# Patient Record
Sex: Male | Born: 1935 | Race: Black or African American | Hispanic: No | Smoking: Former smoker
Health system: Southern US, Community
[De-identification: ages and names within clinical notes are randomized; demographics above are authoritative.]

## PROBLEM LIST (undated history)

## (undated) DIAGNOSIS — I10 Essential (primary) hypertension: Secondary | ICD-10-CM

## (undated) DIAGNOSIS — R011 Cardiac murmur, unspecified: Secondary | ICD-10-CM

## (undated) DIAGNOSIS — K449 Diaphragmatic hernia without obstruction or gangrene: Secondary | ICD-10-CM

## (undated) DIAGNOSIS — K219 Gastro-esophageal reflux disease without esophagitis: Secondary | ICD-10-CM

## (undated) DIAGNOSIS — Z8669 Personal history of other diseases of the nervous system and sense organs: Secondary | ICD-10-CM

## (undated) DIAGNOSIS — M4800 Spinal stenosis, site unspecified: Secondary | ICD-10-CM

## (undated) DIAGNOSIS — N400 Enlarged prostate without lower urinary tract symptoms: Secondary | ICD-10-CM

## (undated) HISTORY — DX: Benign prostatic hyperplasia without lower urinary tract symptoms: N40.0

## (undated) HISTORY — DX: Personal history of other diseases of the nervous system and sense organs: Z86.69

## (undated) HISTORY — DX: Essential (primary) hypertension: I10

## (undated) HISTORY — PX: TOTAL KNEE ARTHROPLASTY: SHX125

## (undated) HISTORY — DX: Diaphragmatic hernia without obstruction or gangrene: K44.9

## (undated) HISTORY — DX: Spinal stenosis, site unspecified: M48.00

## (undated) HISTORY — DX: Gastro-esophageal reflux disease without esophagitis: K21.9

## (undated) HISTORY — PX: CATARACT EXTRACTION, BILATERAL: SHX1313

## (undated) HISTORY — DX: Cardiac murmur, unspecified: R01.1

---

## 1997-07-27 ENCOUNTER — Encounter: Admission: RE | Admit: 1997-07-27 | Discharge: 1997-10-25 | Payer: Self-pay | Admitting: Sports Medicine

## 1997-07-27 ENCOUNTER — Encounter: Admission: RE | Admit: 1997-07-27 | Discharge: 1997-07-27 | Payer: Self-pay | Admitting: Sports Medicine

## 1997-10-09 ENCOUNTER — Encounter: Admission: RE | Admit: 1997-10-09 | Discharge: 1997-10-09 | Payer: Self-pay | Admitting: Family Medicine

## 1997-11-03 ENCOUNTER — Encounter: Admission: RE | Admit: 1997-11-03 | Discharge: 1997-11-03 | Payer: Self-pay | Admitting: Family Medicine

## 1997-12-25 ENCOUNTER — Encounter: Admission: RE | Admit: 1997-12-25 | Discharge: 1997-12-25 | Payer: Self-pay | Admitting: Family Medicine

## 1998-01-15 ENCOUNTER — Encounter: Admission: RE | Admit: 1998-01-15 | Discharge: 1998-01-15 | Payer: Self-pay | Admitting: Family Medicine

## 1998-02-01 ENCOUNTER — Encounter: Admission: RE | Admit: 1998-02-01 | Discharge: 1998-02-01 | Payer: Self-pay | Admitting: Family Medicine

## 1998-02-15 ENCOUNTER — Encounter: Admission: RE | Admit: 1998-02-15 | Discharge: 1998-02-15 | Payer: Self-pay | Admitting: Sports Medicine

## 1998-04-06 ENCOUNTER — Encounter: Admission: RE | Admit: 1998-04-06 | Discharge: 1998-04-06 | Payer: Self-pay | Admitting: Family Medicine

## 1998-05-10 ENCOUNTER — Encounter: Admission: RE | Admit: 1998-05-10 | Discharge: 1998-05-10 | Payer: Self-pay | Admitting: Sports Medicine

## 1998-05-31 ENCOUNTER — Ambulatory Visit (HOSPITAL_COMMUNITY): Admission: RE | Admit: 1998-05-31 | Discharge: 1998-05-31 | Payer: Self-pay | Admitting: Sports Medicine

## 1998-05-31 ENCOUNTER — Encounter: Admission: RE | Admit: 1998-05-31 | Discharge: 1998-05-31 | Payer: Self-pay | Admitting: Sports Medicine

## 1998-07-10 ENCOUNTER — Encounter: Admission: RE | Admit: 1998-07-10 | Discharge: 1998-07-10 | Payer: Self-pay | Admitting: Sports Medicine

## 1998-10-16 ENCOUNTER — Encounter: Admission: RE | Admit: 1998-10-16 | Discharge: 1998-10-16 | Payer: Self-pay | Admitting: Family Medicine

## 1999-01-10 ENCOUNTER — Encounter: Admission: RE | Admit: 1999-01-10 | Discharge: 1999-01-10 | Payer: Self-pay | Admitting: Family Medicine

## 1999-01-17 ENCOUNTER — Encounter: Admission: RE | Admit: 1999-01-17 | Discharge: 1999-01-17 | Payer: Self-pay | Admitting: Sports Medicine

## 1999-02-07 ENCOUNTER — Encounter: Admission: RE | Admit: 1999-02-07 | Discharge: 1999-02-07 | Payer: Self-pay | Admitting: Family Medicine

## 1999-04-17 ENCOUNTER — Encounter: Admission: RE | Admit: 1999-04-17 | Discharge: 1999-04-17 | Payer: Self-pay | Admitting: Family Medicine

## 1999-05-09 ENCOUNTER — Encounter: Admission: RE | Admit: 1999-05-09 | Discharge: 1999-05-09 | Payer: Self-pay | Admitting: Sports Medicine

## 1999-06-06 ENCOUNTER — Encounter: Admission: RE | Admit: 1999-06-06 | Discharge: 1999-06-06 | Payer: Self-pay | Admitting: Sports Medicine

## 1999-07-05 ENCOUNTER — Ambulatory Visit (HOSPITAL_COMMUNITY): Admission: RE | Admit: 1999-07-05 | Discharge: 1999-07-05 | Payer: Self-pay | Admitting: Sports Medicine

## 1999-08-01 ENCOUNTER — Encounter: Admission: RE | Admit: 1999-08-01 | Discharge: 1999-08-01 | Payer: Self-pay | Admitting: Family Medicine

## 1999-09-24 ENCOUNTER — Encounter: Admission: RE | Admit: 1999-09-24 | Discharge: 1999-09-24 | Payer: Self-pay | Admitting: Sports Medicine

## 2000-01-24 ENCOUNTER — Encounter: Admission: RE | Admit: 2000-01-24 | Discharge: 2000-01-24 | Payer: Self-pay | Admitting: Family Medicine

## 2000-03-24 ENCOUNTER — Encounter (INDEPENDENT_AMBULATORY_CARE_PROVIDER_SITE_OTHER): Payer: Self-pay

## 2000-03-24 ENCOUNTER — Other Ambulatory Visit: Admission: RE | Admit: 2000-03-24 | Discharge: 2000-03-24 | Payer: Self-pay | Admitting: Urology

## 2000-03-26 ENCOUNTER — Encounter: Admission: RE | Admit: 2000-03-26 | Discharge: 2000-03-26 | Payer: Self-pay | Admitting: Family Medicine

## 2000-06-18 ENCOUNTER — Encounter: Admission: RE | Admit: 2000-06-18 | Discharge: 2000-06-18 | Payer: Self-pay | Admitting: Sports Medicine

## 2000-07-27 ENCOUNTER — Encounter: Admission: RE | Admit: 2000-07-27 | Discharge: 2000-07-27 | Payer: Self-pay | Admitting: Family Medicine

## 2000-10-22 ENCOUNTER — Encounter: Admission: RE | Admit: 2000-10-22 | Discharge: 2000-10-22 | Payer: Self-pay | Admitting: Family Medicine

## 2000-11-19 ENCOUNTER — Encounter: Admission: RE | Admit: 2000-11-19 | Discharge: 2000-11-19 | Payer: Self-pay | Admitting: Sports Medicine

## 2001-06-16 ENCOUNTER — Encounter: Admission: RE | Admit: 2001-06-16 | Discharge: 2001-06-16 | Payer: Self-pay | Admitting: Family Medicine

## 2001-07-06 ENCOUNTER — Encounter: Admission: RE | Admit: 2001-07-06 | Discharge: 2001-07-06 | Payer: Self-pay | Admitting: Sports Medicine

## 2001-07-06 ENCOUNTER — Ambulatory Visit (HOSPITAL_COMMUNITY): Admission: RE | Admit: 2001-07-06 | Discharge: 2001-07-06 | Payer: Self-pay | Admitting: Sports Medicine

## 2001-09-24 ENCOUNTER — Encounter: Admission: RE | Admit: 2001-09-24 | Discharge: 2001-09-24 | Payer: Self-pay | Admitting: Family Medicine

## 2001-10-19 ENCOUNTER — Ambulatory Visit (HOSPITAL_COMMUNITY): Admission: RE | Admit: 2001-10-19 | Discharge: 2001-10-19 | Payer: Self-pay | Admitting: Sports Medicine

## 2001-10-19 ENCOUNTER — Encounter: Admission: RE | Admit: 2001-10-19 | Discharge: 2001-10-19 | Payer: Self-pay | Admitting: Sports Medicine

## 2002-01-18 ENCOUNTER — Ambulatory Visit (HOSPITAL_COMMUNITY): Admission: RE | Admit: 2002-01-18 | Discharge: 2002-01-18 | Payer: Self-pay | Admitting: Family Medicine

## 2002-01-18 ENCOUNTER — Encounter: Admission: RE | Admit: 2002-01-18 | Discharge: 2002-01-18 | Payer: Self-pay | Admitting: Family Medicine

## 2002-01-19 ENCOUNTER — Encounter: Admission: RE | Admit: 2002-01-19 | Discharge: 2002-01-19 | Payer: Self-pay | Admitting: Sports Medicine

## 2002-04-01 ENCOUNTER — Encounter: Admission: RE | Admit: 2002-04-01 | Discharge: 2002-04-01 | Payer: Self-pay | Admitting: Sports Medicine

## 2002-05-19 ENCOUNTER — Encounter: Admission: RE | Admit: 2002-05-19 | Discharge: 2002-05-19 | Payer: Self-pay | Admitting: Sports Medicine

## 2002-07-14 ENCOUNTER — Encounter: Admission: RE | Admit: 2002-07-14 | Discharge: 2002-07-14 | Payer: Self-pay | Admitting: Sports Medicine

## 2002-08-24 ENCOUNTER — Encounter: Admission: RE | Admit: 2002-08-24 | Discharge: 2002-08-24 | Payer: Self-pay | Admitting: Family Medicine

## 2002-10-28 ENCOUNTER — Ambulatory Visit (HOSPITAL_COMMUNITY): Admission: RE | Admit: 2002-10-28 | Discharge: 2002-10-28 | Payer: Self-pay | Admitting: Sports Medicine

## 2002-12-12 ENCOUNTER — Encounter: Admission: RE | Admit: 2002-12-12 | Discharge: 2002-12-12 | Payer: Self-pay | Admitting: Family Medicine

## 2003-01-12 ENCOUNTER — Encounter: Admission: RE | Admit: 2003-01-12 | Discharge: 2003-01-12 | Payer: Self-pay | Admitting: Sports Medicine

## 2003-02-20 ENCOUNTER — Encounter: Admission: RE | Admit: 2003-02-20 | Discharge: 2003-02-20 | Payer: Self-pay | Admitting: Family Medicine

## 2003-02-20 ENCOUNTER — Ambulatory Visit (HOSPITAL_COMMUNITY): Admission: RE | Admit: 2003-02-20 | Discharge: 2003-02-20 | Payer: Self-pay | Admitting: Family Medicine

## 2003-03-09 ENCOUNTER — Ambulatory Visit (HOSPITAL_COMMUNITY): Admission: RE | Admit: 2003-03-09 | Discharge: 2003-03-09 | Payer: Self-pay | Admitting: Sports Medicine

## 2003-03-09 ENCOUNTER — Encounter: Payer: Self-pay | Admitting: Cardiology

## 2003-03-16 ENCOUNTER — Encounter: Admission: RE | Admit: 2003-03-16 | Discharge: 2003-03-16 | Payer: Self-pay | Admitting: Family Medicine

## 2003-05-11 ENCOUNTER — Encounter: Admission: RE | Admit: 2003-05-11 | Discharge: 2003-05-11 | Payer: Self-pay | Admitting: Sports Medicine

## 2003-09-07 ENCOUNTER — Encounter: Admission: RE | Admit: 2003-09-07 | Discharge: 2003-09-07 | Payer: Self-pay | Admitting: Sports Medicine

## 2003-12-04 ENCOUNTER — Encounter: Admission: RE | Admit: 2003-12-04 | Discharge: 2003-12-04 | Payer: Self-pay | Admitting: Family Medicine

## 2004-01-04 ENCOUNTER — Ambulatory Visit: Payer: Self-pay | Admitting: Sports Medicine

## 2004-01-23 ENCOUNTER — Ambulatory Visit: Payer: Self-pay | Admitting: Family Medicine

## 2004-07-18 ENCOUNTER — Ambulatory Visit: Payer: Self-pay | Admitting: Sports Medicine

## 2004-11-12 ENCOUNTER — Ambulatory Visit: Payer: Self-pay | Admitting: Sports Medicine

## 2004-11-12 ENCOUNTER — Ambulatory Visit (HOSPITAL_COMMUNITY): Admission: RE | Admit: 2004-11-12 | Discharge: 2004-11-12 | Payer: Self-pay | Admitting: Sports Medicine

## 2005-02-04 ENCOUNTER — Ambulatory Visit: Payer: Self-pay | Admitting: Sports Medicine

## 2005-03-06 ENCOUNTER — Ambulatory Visit: Payer: Self-pay | Admitting: Sports Medicine

## 2005-04-08 ENCOUNTER — Ambulatory Visit: Payer: Self-pay | Admitting: Sports Medicine

## 2006-01-15 ENCOUNTER — Ambulatory Visit: Payer: Self-pay | Admitting: Sports Medicine

## 2006-04-14 ENCOUNTER — Emergency Department (HOSPITAL_COMMUNITY): Admission: EM | Admit: 2006-04-14 | Discharge: 2006-04-14 | Payer: Self-pay | Admitting: Emergency Medicine

## 2006-05-26 ENCOUNTER — Encounter: Payer: Self-pay | Admitting: Sports Medicine

## 2006-05-26 ENCOUNTER — Ambulatory Visit: Payer: Self-pay | Admitting: Family Medicine

## 2006-05-26 LAB — CONVERTED CEMR LAB
ALT: 17 units/L (ref 0–53)
AST: 19 units/L (ref 0–37)
Albumin: 4.1 g/dL (ref 3.5–5.2)
Alkaline Phosphatase: 40 units/L (ref 39–117)
BUN: 19 mg/dL (ref 6–23)
CO2: 26 meq/L (ref 19–32)
Calcium: 8.8 mg/dL (ref 8.4–10.5)
Chloride: 99 meq/L (ref 96–112)
Cholesterol: 189 mg/dL (ref 0–200)
Creatinine, Ser: 1.65 mg/dL — ABNORMAL HIGH (ref 0.40–1.50)
Glucose, Bld: 85 mg/dL (ref 70–99)
HCT: 43.2 % (ref 39.0–52.0)
HDL: 60 mg/dL (ref >39)
Hemoglobin: 14 g/dL (ref 13.0–17.0)
LDL Cholesterol: 116 mg/dL — ABNORMAL HIGH (ref 0–99)
MCHC: 32.4 g/dL (ref 30.0–36.0)
MCV: 83.9 fL (ref 78.0–100.0)
PSA: 1.94 ng/mL (ref 0.10–4.00)
Platelets: 129 10*3/uL — ABNORMAL LOW (ref 150–400)
Potassium: 4.5 meq/L (ref 3.5–5.3)
RBC: 5.15 M/uL (ref 4.22–5.81)
RDW: 15.5 % — ABNORMAL HIGH (ref 11.5–14.0)
Sodium: 138 meq/L (ref 135–145)
Total Bilirubin: 0.6 mg/dL (ref 0.3–1.2)
Total CHOL/HDL Ratio: 3.2
Total Protein: 7.5 g/dL (ref 6.0–8.3)
Triglycerides: 63 mg/dL (ref <150)
VLDL: 13 mg/dL (ref 0–40)
WBC: 6.4 10*3/uL (ref 4.0–10.5)

## 2006-06-02 ENCOUNTER — Ambulatory Visit: Payer: Self-pay | Admitting: Sports Medicine

## 2006-06-08 ENCOUNTER — Encounter: Admission: RE | Admit: 2006-06-08 | Discharge: 2006-06-08 | Payer: Self-pay | Admitting: Sports Medicine

## 2006-06-11 ENCOUNTER — Ambulatory Visit: Payer: Self-pay | Admitting: Sports Medicine

## 2006-06-19 ENCOUNTER — Ambulatory Visit: Payer: Self-pay | Admitting: Family Medicine

## 2006-06-25 DIAGNOSIS — I10 Essential (primary) hypertension: Secondary | ICD-10-CM | POA: Insufficient documentation

## 2006-06-25 DIAGNOSIS — J45909 Unspecified asthma, uncomplicated: Secondary | ICD-10-CM | POA: Insufficient documentation

## 2006-06-25 DIAGNOSIS — E785 Hyperlipidemia, unspecified: Secondary | ICD-10-CM | POA: Insufficient documentation

## 2006-06-25 DIAGNOSIS — F5232 Male orgasmic disorder: Secondary | ICD-10-CM | POA: Insufficient documentation

## 2006-06-26 ENCOUNTER — Encounter: Payer: Self-pay | Admitting: Sports Medicine

## 2006-06-26 ENCOUNTER — Ambulatory Visit: Payer: Self-pay | Admitting: Family Medicine

## 2006-06-26 LAB — CONVERTED CEMR LAB: Uric Acid, Serum: 8.8 mg/dL — ABNORMAL HIGH (ref 2.4–7.0)

## 2006-07-01 ENCOUNTER — Telehealth: Payer: Self-pay | Admitting: Sports Medicine

## 2006-07-17 ENCOUNTER — Telehealth: Payer: Self-pay | Admitting: *Deleted

## 2006-07-22 ENCOUNTER — Telehealth: Payer: Self-pay | Admitting: *Deleted

## 2006-07-23 ENCOUNTER — Encounter: Payer: Self-pay | Admitting: Family Medicine

## 2006-07-23 ENCOUNTER — Ambulatory Visit: Payer: Self-pay | Admitting: Sports Medicine

## 2006-07-24 ENCOUNTER — Ambulatory Visit: Payer: Self-pay | Admitting: Sports Medicine

## 2006-07-24 DIAGNOSIS — M5137 Other intervertebral disc degeneration, lumbosacral region: Secondary | ICD-10-CM | POA: Insufficient documentation

## 2006-07-24 DIAGNOSIS — M51379 Other intervertebral disc degeneration, lumbosacral region without mention of lumbar back pain or lower extremity pain: Secondary | ICD-10-CM | POA: Insufficient documentation

## 2006-08-28 ENCOUNTER — Ambulatory Visit: Payer: Self-pay | Admitting: Sports Medicine

## 2006-09-11 ENCOUNTER — Ambulatory Visit: Payer: Self-pay | Admitting: Sports Medicine

## 2006-09-28 ENCOUNTER — Telehealth (INDEPENDENT_AMBULATORY_CARE_PROVIDER_SITE_OTHER): Payer: Self-pay | Admitting: *Deleted

## 2006-10-06 ENCOUNTER — Encounter: Payer: Self-pay | Admitting: Sports Medicine

## 2006-11-06 ENCOUNTER — Ambulatory Visit: Payer: Self-pay | Admitting: Sports Medicine

## 2006-11-06 ENCOUNTER — Ambulatory Visit (HOSPITAL_COMMUNITY): Admission: RE | Admit: 2006-11-06 | Discharge: 2006-11-06 | Payer: Self-pay | Admitting: Sports Medicine

## 2006-12-03 ENCOUNTER — Emergency Department (HOSPITAL_COMMUNITY): Admission: EM | Admit: 2006-12-03 | Discharge: 2006-12-03 | Payer: Self-pay | Admitting: Emergency Medicine

## 2006-12-07 ENCOUNTER — Ambulatory Visit: Payer: Self-pay | Admitting: Sports Medicine

## 2006-12-07 ENCOUNTER — Encounter (INDEPENDENT_AMBULATORY_CARE_PROVIDER_SITE_OTHER): Payer: Self-pay | Admitting: Family Medicine

## 2006-12-07 LAB — CONVERTED CEMR LAB
ALT: 27 units/L (ref 0–53)
AST: 18 units/L (ref 0–37)
Albumin: 4.1 g/dL (ref 3.5–5.2)
Alkaline Phosphatase: 50 units/L (ref 39–117)
BUN: 20 mg/dL (ref 6–23)
Basophils Absolute: 0 10*3/uL (ref 0.0–0.1)
Basophils Relative: 0 % (ref 0–1)
CO2: 23 meq/L (ref 19–32)
Calcium: 8.8 mg/dL (ref 8.4–10.5)
Chloride: 105 meq/L (ref 96–112)
Creatinine, Ser: 1.41 mg/dL (ref 0.40–1.50)
Eosinophils Absolute: 0.1 10*3/uL (ref 0.0–0.7)
Eosinophils Relative: 1 % (ref 0–5)
Glucose, Bld: 90 mg/dL (ref 70–99)
HCT: 41.1 % (ref 39.0–52.0)
Hemoglobin: 13.5 g/dL (ref 13.0–17.0)
Lymphocytes Relative: 28 % (ref 12–46)
Lymphs Abs: 2 10*3/uL (ref 0.7–3.3)
MCHC: 32.8 g/dL (ref 30.0–36.0)
MCV: 83.9 fL (ref 78.0–100.0)
Monocytes Absolute: 0.6 10*3/uL (ref 0.2–0.7)
Monocytes Relative: 8 % (ref 3–11)
Neutro Abs: 4.5 10*3/uL (ref 1.7–7.7)
Neutrophils Relative %: 63 % (ref 43–77)
Platelets: 139 10*3/uL — ABNORMAL LOW (ref 150–400)
Potassium: 3.9 meq/L (ref 3.5–5.3)
RBC: 4.9 M/uL (ref 4.22–5.81)
RDW: 15 % — ABNORMAL HIGH (ref 11.5–14.0)
Sodium: 139 meq/L (ref 135–145)
Total Bilirubin: 0.6 mg/dL (ref 0.3–1.2)
Total Protein: 7.3 g/dL (ref 6.0–8.3)
WBC: 7.2 10*3/uL (ref 4.0–10.5)

## 2006-12-15 ENCOUNTER — Ambulatory Visit: Payer: Self-pay | Admitting: Sports Medicine

## 2007-01-05 ENCOUNTER — Ambulatory Visit: Payer: Self-pay | Admitting: Sports Medicine

## 2007-01-13 ENCOUNTER — Telehealth: Payer: Self-pay | Admitting: Sports Medicine

## 2007-02-01 ENCOUNTER — Telehealth: Payer: Self-pay | Admitting: *Deleted

## 2007-02-03 ENCOUNTER — Encounter: Payer: Self-pay | Admitting: *Deleted

## 2007-02-05 ENCOUNTER — Encounter (INDEPENDENT_AMBULATORY_CARE_PROVIDER_SITE_OTHER): Payer: Self-pay | Admitting: *Deleted

## 2007-02-09 ENCOUNTER — Ambulatory Visit: Payer: Self-pay | Admitting: Sports Medicine

## 2007-02-18 ENCOUNTER — Ambulatory Visit: Payer: Self-pay | Admitting: Family Medicine

## 2007-02-18 ENCOUNTER — Telehealth (INDEPENDENT_AMBULATORY_CARE_PROVIDER_SITE_OTHER): Payer: Self-pay | Admitting: *Deleted

## 2007-02-18 DIAGNOSIS — IMO0002 Reserved for concepts with insufficient information to code with codable children: Secondary | ICD-10-CM | POA: Insufficient documentation

## 2007-02-18 DIAGNOSIS — M1710 Unilateral primary osteoarthritis, unspecified knee: Secondary | ICD-10-CM

## 2007-02-26 ENCOUNTER — Telehealth: Payer: Self-pay | Admitting: Sports Medicine

## 2007-03-01 ENCOUNTER — Telehealth: Payer: Self-pay | Admitting: Sports Medicine

## 2007-03-04 ENCOUNTER — Telehealth: Payer: Self-pay | Admitting: *Deleted

## 2007-03-23 ENCOUNTER — Encounter: Payer: Self-pay | Admitting: Sports Medicine

## 2007-04-07 ENCOUNTER — Telehealth: Payer: Self-pay | Admitting: Sports Medicine

## 2007-04-09 ENCOUNTER — Encounter: Payer: Self-pay | Admitting: Sports Medicine

## 2007-04-14 ENCOUNTER — Telehealth: Payer: Self-pay | Admitting: Sports Medicine

## 2007-04-27 ENCOUNTER — Telehealth: Payer: Self-pay | Admitting: Sports Medicine

## 2007-05-04 ENCOUNTER — Telehealth (INDEPENDENT_AMBULATORY_CARE_PROVIDER_SITE_OTHER): Payer: Self-pay | Admitting: *Deleted

## 2007-05-05 ENCOUNTER — Encounter: Payer: Self-pay | Admitting: Sports Medicine

## 2007-05-12 ENCOUNTER — Telehealth: Payer: Self-pay | Admitting: *Deleted

## 2007-05-14 ENCOUNTER — Ambulatory Visit: Payer: Self-pay | Admitting: Sports Medicine

## 2007-05-14 DIAGNOSIS — Z8739 Personal history of other diseases of the musculoskeletal system and connective tissue: Secondary | ICD-10-CM | POA: Insufficient documentation

## 2007-05-14 LAB — CONVERTED CEMR LAB
ALT: 12 units/L (ref 0–53)
AST: 16 units/L (ref 0–37)
Albumin: 4.1 g/dL (ref 3.5–5.2)
Alkaline Phosphatase: 58 units/L (ref 39–117)
BUN: 16 mg/dL (ref 6–23)
CO2: 27 meq/L (ref 19–32)
Calcium: 9.6 mg/dL (ref 8.4–10.5)
Chloride: 101 meq/L (ref 96–112)
Creatinine, Ser: 1.49 mg/dL (ref 0.40–1.50)
Glucose, Bld: 102 mg/dL — ABNORMAL HIGH (ref 70–99)
Potassium: 3.6 meq/L (ref 3.5–5.3)
Sodium: 142 meq/L (ref 135–145)
Total Bilirubin: 0.4 mg/dL (ref 0.3–1.2)
Total Protein: 7.9 g/dL (ref 6.0–8.3)
Uric Acid, Serum: 8.3 mg/dL — ABNORMAL HIGH (ref 4.0–7.8)

## 2007-05-21 ENCOUNTER — Ambulatory Visit: Payer: Self-pay | Admitting: Sports Medicine

## 2007-05-26 ENCOUNTER — Telehealth: Payer: Self-pay | Admitting: Sports Medicine

## 2007-08-19 ENCOUNTER — Encounter: Payer: Self-pay | Admitting: Sports Medicine

## 2007-08-31 ENCOUNTER — Encounter: Admission: RE | Admit: 2007-08-31 | Discharge: 2007-08-31 | Payer: Self-pay | Admitting: Sports Medicine

## 2007-08-31 ENCOUNTER — Telehealth: Payer: Self-pay | Admitting: Sports Medicine

## 2007-08-31 ENCOUNTER — Ambulatory Visit: Payer: Self-pay | Admitting: Sports Medicine

## 2007-09-03 ENCOUNTER — Encounter: Payer: Self-pay | Admitting: Sports Medicine

## 2007-09-03 ENCOUNTER — Telehealth: Payer: Self-pay | Admitting: *Deleted

## 2007-09-07 ENCOUNTER — Telehealth: Payer: Self-pay | Admitting: *Deleted

## 2007-09-13 ENCOUNTER — Inpatient Hospital Stay (HOSPITAL_COMMUNITY): Admission: RE | Admit: 2007-09-13 | Discharge: 2007-09-17 | Payer: Self-pay | Admitting: Orthopedic Surgery

## 2007-10-04 ENCOUNTER — Telehealth: Payer: Self-pay | Admitting: *Deleted

## 2007-10-04 ENCOUNTER — Encounter: Payer: Self-pay | Admitting: *Deleted

## 2007-10-04 ENCOUNTER — Telehealth: Payer: Self-pay | Admitting: Sports Medicine

## 2007-10-05 ENCOUNTER — Ambulatory Visit: Payer: Self-pay | Admitting: Family Medicine

## 2007-10-05 ENCOUNTER — Encounter: Payer: Self-pay | Admitting: Sports Medicine

## 2007-10-05 LAB — CONVERTED CEMR LAB
ALT: 20 units/L (ref 0–53)
AST: 18 units/L (ref 0–37)
Albumin: 3.8 g/dL (ref 3.5–5.2)
Alkaline Phosphatase: 56 units/L (ref 39–117)
BUN: 22 mg/dL (ref 6–23)
CO2: 24 meq/L (ref 19–32)
Calcium: 9 mg/dL (ref 8.4–10.5)
Chloride: 104 meq/L (ref 96–112)
Cholesterol: 173 mg/dL (ref 0–200)
Creatinine, Ser: 1.34 mg/dL (ref 0.40–1.50)
Glucose, Bld: 86 mg/dL (ref 70–99)
HDL: 54 mg/dL (ref >39)
LDL Cholesterol: 106 mg/dL — ABNORMAL HIGH (ref 0–99)
PSA: 3.68 ng/mL (ref 0.10–4.00)
Potassium: 4.1 meq/L (ref 3.5–5.3)
Sodium: 142 meq/L (ref 135–145)
Total Bilirubin: 0.5 mg/dL (ref 0.3–1.2)
Total CHOL/HDL Ratio: 3.2
Total Protein: 7.1 g/dL (ref 6.0–8.3)
Triglycerides: 64 mg/dL (ref <150)
VLDL: 13 mg/dL (ref 0–40)

## 2007-10-06 ENCOUNTER — Telehealth: Payer: Self-pay | Admitting: *Deleted

## 2007-10-07 ENCOUNTER — Ambulatory Visit: Payer: Self-pay | Admitting: Sports Medicine

## 2007-11-17 ENCOUNTER — Telehealth: Payer: Self-pay | Admitting: *Deleted

## 2007-12-02 ENCOUNTER — Ambulatory Visit: Payer: Self-pay | Admitting: Sports Medicine

## 2008-01-26 ENCOUNTER — Telehealth: Payer: Self-pay | Admitting: Sports Medicine

## 2008-02-08 ENCOUNTER — Ambulatory Visit: Payer: Self-pay | Admitting: Sports Medicine

## 2008-03-31 ENCOUNTER — Encounter: Payer: Self-pay | Admitting: Family Medicine

## 2008-05-09 ENCOUNTER — Ambulatory Visit: Payer: Self-pay | Admitting: Family Medicine

## 2008-05-10 LAB — CONVERTED CEMR LAB
BUN: 19 mg/dL (ref 6–23)
CO2: 25 meq/L (ref 19–32)
Calcium: 9.7 mg/dL (ref 8.4–10.5)
Chloride: 100 meq/L (ref 96–112)
Creatinine, Ser: 1.48 mg/dL (ref 0.40–1.50)
Glucose, Bld: 89 mg/dL (ref 70–99)
Potassium: 4 meq/L (ref 3.5–5.3)
Sodium: 143 meq/L (ref 135–145)
Uric Acid, Serum: 10.1 mg/dL — ABNORMAL HIGH (ref 4.0–7.8)

## 2008-06-09 ENCOUNTER — Ambulatory Visit: Payer: Self-pay | Admitting: Family Medicine

## 2008-06-23 ENCOUNTER — Ambulatory Visit: Payer: Self-pay | Admitting: Sports Medicine

## 2008-10-27 ENCOUNTER — Telehealth: Payer: Self-pay | Admitting: *Deleted

## 2008-11-03 ENCOUNTER — Ambulatory Visit: Payer: Self-pay | Admitting: Sports Medicine

## 2008-11-03 DIAGNOSIS — M6520 Calcific tendinitis, unspecified site: Secondary | ICD-10-CM | POA: Insufficient documentation

## 2008-11-14 ENCOUNTER — Ambulatory Visit (HOSPITAL_COMMUNITY): Admission: RE | Admit: 2008-11-14 | Discharge: 2008-11-14 | Payer: Self-pay | Admitting: Sports Medicine

## 2008-11-14 ENCOUNTER — Ambulatory Visit: Payer: Self-pay | Admitting: Sports Medicine

## 2008-11-28 ENCOUNTER — Encounter: Payer: Self-pay | Admitting: Family Medicine

## 2008-12-04 ENCOUNTER — Ambulatory Visit: Payer: Self-pay | Admitting: Sports Medicine

## 2009-03-09 ENCOUNTER — Ambulatory Visit: Payer: Self-pay | Admitting: Family Medicine

## 2009-03-09 DIAGNOSIS — K219 Gastro-esophageal reflux disease without esophagitis: Secondary | ICD-10-CM | POA: Insufficient documentation

## 2009-04-28 HISTORY — PX: TRANSURETHRAL RESECTION OF PROSTATE: SHX73

## 2009-05-09 ENCOUNTER — Ambulatory Visit: Payer: Self-pay | Admitting: Family Medicine

## 2009-05-10 ENCOUNTER — Encounter: Admission: RE | Admit: 2009-05-10 | Discharge: 2009-05-10 | Payer: Self-pay | Admitting: Family Medicine

## 2009-05-10 ENCOUNTER — Encounter: Payer: Self-pay | Admitting: Family Medicine

## 2009-05-10 LAB — CONVERTED CEMR LAB
ALT: 16 units/L (ref 0–53)
AST: 18 units/L (ref 0–37)
Albumin: 4.5 g/dL (ref 3.5–5.2)
Alkaline Phosphatase: 46 units/L (ref 39–117)
BUN: 21 mg/dL (ref 6–23)
CO2: 23 meq/L (ref 19–32)
Calcium: 8.8 mg/dL (ref 8.4–10.5)
Chloride: 103 meq/L (ref 96–112)
Creatinine, Ser: 1.5 mg/dL (ref 0.40–1.50)
Glucose, Bld: 79 mg/dL (ref 70–99)
HCT: 42.4 % (ref 39.0–52.0)
Hemoglobin: 14.4 g/dL (ref 13.0–17.0)
Lipase: 20 units/L (ref 0–75)
MCHC: 34 g/dL (ref 30.0–36.0)
MCV: 81.4 fL (ref 78.0–100.0)
Platelets: 145 10*3/uL — ABNORMAL LOW (ref 150–400)
Potassium: 3.3 meq/L — ABNORMAL LOW (ref 3.5–5.3)
RBC: 5.21 M/uL (ref 4.22–5.81)
RDW: 15.3 % (ref 11.5–15.5)
Sodium: 142 meq/L (ref 135–145)
Total Bilirubin: 0.5 mg/dL (ref 0.3–1.2)
Total Protein: 7.7 g/dL (ref 6.0–8.3)
WBC: 7.4 10*3/uL (ref 4.0–10.5)

## 2009-05-16 ENCOUNTER — Telehealth: Payer: Self-pay | Admitting: Gastroenterology

## 2009-05-16 ENCOUNTER — Encounter (INDEPENDENT_AMBULATORY_CARE_PROVIDER_SITE_OTHER): Payer: Self-pay | Admitting: *Deleted

## 2009-05-30 ENCOUNTER — Encounter: Payer: Self-pay | Admitting: Family Medicine

## 2009-05-31 ENCOUNTER — Encounter: Payer: Self-pay | Admitting: Family Medicine

## 2009-06-22 ENCOUNTER — Encounter: Payer: Self-pay | Admitting: Family Medicine

## 2009-06-26 ENCOUNTER — Encounter: Payer: Self-pay | Admitting: Family Medicine

## 2009-07-05 ENCOUNTER — Encounter: Payer: Self-pay | Admitting: *Deleted

## 2009-08-09 ENCOUNTER — Encounter: Payer: Self-pay | Admitting: Family Medicine

## 2009-08-15 ENCOUNTER — Ambulatory Visit: Payer: Self-pay | Admitting: Sports Medicine

## 2009-08-30 ENCOUNTER — Ambulatory Visit: Payer: Self-pay | Admitting: Sports Medicine

## 2009-08-30 DIAGNOSIS — IMO0002 Reserved for concepts with insufficient information to code with codable children: Secondary | ICD-10-CM | POA: Insufficient documentation

## 2009-08-31 ENCOUNTER — Encounter: Payer: Self-pay | Admitting: Family Medicine

## 2009-08-31 ENCOUNTER — Encounter: Admission: RE | Admit: 2009-08-31 | Discharge: 2009-08-31 | Payer: Self-pay | Admitting: Sports Medicine

## 2009-09-04 ENCOUNTER — Ambulatory Visit: Payer: Self-pay | Admitting: Family Medicine

## 2009-09-04 LAB — CONVERTED CEMR LAB
ALT: 13 units/L (ref 0–53)
AST: 19 units/L (ref 0–37)
Albumin: 4.2 g/dL (ref 3.5–5.2)
Alkaline Phosphatase: 50 units/L (ref 39–117)
BUN: 28 mg/dL — ABNORMAL HIGH (ref 6–23)
CO2: 23 meq/L (ref 19–32)
Calcium: 9.1 mg/dL (ref 8.4–10.5)
Chloride: 103 meq/L (ref 96–112)
Creatinine, Ser: 1.63 mg/dL — ABNORMAL HIGH (ref 0.40–1.50)
Direct LDL: 103 mg/dL — ABNORMAL HIGH
Glucose, Bld: 90 mg/dL (ref 70–99)
Potassium: 3.8 meq/L (ref 3.5–5.3)
Sodium: 142 meq/L (ref 135–145)
Total Bilirubin: 0.5 mg/dL (ref 0.3–1.2)
Total Protein: 7.6 g/dL (ref 6.0–8.3)

## 2009-09-07 ENCOUNTER — Telehealth: Payer: Self-pay | Admitting: Family Medicine

## 2009-09-07 ENCOUNTER — Encounter: Payer: Self-pay | Admitting: Family Medicine

## 2009-09-10 ENCOUNTER — Telehealth: Payer: Self-pay | Admitting: Family Medicine

## 2009-09-25 ENCOUNTER — Encounter: Payer: Self-pay | Admitting: Sports Medicine

## 2009-09-27 ENCOUNTER — Encounter: Payer: Self-pay | Admitting: Sports Medicine

## 2009-09-27 ENCOUNTER — Encounter: Admission: RE | Admit: 2009-09-27 | Discharge: 2009-09-27 | Payer: Self-pay | Admitting: Sports Medicine

## 2009-09-28 ENCOUNTER — Telehealth: Payer: Self-pay | Admitting: Family Medicine

## 2009-09-28 ENCOUNTER — Encounter: Payer: Self-pay | Admitting: Family Medicine

## 2009-10-01 ENCOUNTER — Encounter: Payer: Self-pay | Admitting: Sports Medicine

## 2009-10-02 ENCOUNTER — Encounter: Payer: Self-pay | Admitting: Family Medicine

## 2009-10-08 ENCOUNTER — Encounter: Payer: Self-pay | Admitting: Sports Medicine

## 2009-10-30 ENCOUNTER — Telehealth: Payer: Self-pay | Admitting: Family Medicine

## 2009-11-02 ENCOUNTER — Encounter: Payer: Self-pay | Admitting: Sports Medicine

## 2009-11-13 ENCOUNTER — Encounter: Admission: RE | Admit: 2009-11-13 | Discharge: 2009-11-13 | Payer: Self-pay | Admitting: Neurosurgery

## 2009-12-04 ENCOUNTER — Encounter: Admission: RE | Admit: 2009-12-04 | Discharge: 2009-12-04 | Payer: Self-pay | Admitting: Neurosurgery

## 2009-12-20 ENCOUNTER — Ambulatory Visit: Payer: Self-pay | Admitting: Sports Medicine

## 2009-12-27 ENCOUNTER — Encounter: Payer: Self-pay | Admitting: Family Medicine

## 2010-02-05 ENCOUNTER — Encounter: Admission: RE | Admit: 2010-02-05 | Discharge: 2010-02-05 | Payer: Self-pay | Admitting: Neurosurgery

## 2010-02-19 ENCOUNTER — Ambulatory Visit: Payer: Self-pay | Admitting: Sports Medicine

## 2010-02-22 ENCOUNTER — Ambulatory Visit: Payer: Self-pay | Admitting: Family Medicine

## 2010-02-28 ENCOUNTER — Encounter: Payer: Self-pay | Admitting: Family Medicine

## 2010-03-15 ENCOUNTER — Ambulatory Visit: Payer: Self-pay | Admitting: Family Medicine

## 2010-03-15 DIAGNOSIS — N183 Chronic kidney disease, stage 3 unspecified: Secondary | ICD-10-CM | POA: Insufficient documentation

## 2010-03-15 LAB — CONVERTED CEMR LAB
Bilirubin Urine: NEGATIVE
Blood in Urine, dipstick: NEGATIVE
Glucose, Urine, Semiquant: NEGATIVE
Ketones, urine, test strip: NEGATIVE
Nitrite: NEGATIVE
Protein, U semiquant: NEGATIVE
Specific Gravity, Urine: 1.015
Urobilinogen, UA: 0.2
WBC Urine, dipstick: NEGATIVE
pH: 5.5

## 2010-03-26 ENCOUNTER — Encounter: Payer: Self-pay | Admitting: Family Medicine

## 2010-03-26 ENCOUNTER — Ambulatory Visit: Payer: Self-pay | Admitting: Family Medicine

## 2010-03-26 LAB — CONVERTED CEMR LAB
ALT: 15 units/L (ref 0–53)
AST: 18 units/L (ref 0–37)
Albumin: 4.3 g/dL (ref 3.5–5.2)
Alkaline Phosphatase: 44 units/L (ref 39–117)
BUN: 19 mg/dL (ref 6–23)
CO2: 26 meq/L (ref 19–32)
Calcium: 9.2 mg/dL (ref 8.4–10.5)
Chloride: 105 meq/L (ref 96–112)
Cholesterol: 187 mg/dL (ref 0–200)
Creatinine, Ser: 1.28 mg/dL (ref 0.40–1.50)
Glucose, Bld: 87 mg/dL (ref 70–99)
HCT: 42.4 % (ref 39.0–52.0)
HDL: 76 mg/dL (ref >39)
Hemoglobin: 13.9 g/dL (ref 13.0–17.0)
LDL Cholesterol: 100 mg/dL — ABNORMAL HIGH (ref 0–99)
MCHC: 32.8 g/dL (ref 30.0–36.0)
MCV: 83.6 fL (ref 78.0–100.0)
Platelets: 148 10*3/uL — ABNORMAL LOW (ref 150–400)
Potassium: 3.8 meq/L (ref 3.5–5.3)
RBC: 5.07 M/uL (ref 4.22–5.81)
RDW: 14.8 % (ref 11.5–15.5)
Sodium: 143 meq/L (ref 135–145)
TSH: 4.87 microintl units/mL — ABNORMAL HIGH (ref 0.350–4.500)
Total Bilirubin: 0.7 mg/dL (ref 0.3–1.2)
Total CHOL/HDL Ratio: 2.5
Total Protein: 7.3 g/dL (ref 6.0–8.3)
Triglycerides: 53 mg/dL (ref <150)
VLDL: 11 mg/dL (ref 0–40)
Vit D, 25-Hydroxy: 39 ng/mL (ref 30–89)
WBC: 6.7 10*3/uL (ref 4.0–10.5)

## 2010-03-29 ENCOUNTER — Encounter: Payer: Self-pay | Admitting: Family Medicine

## 2010-04-01 ENCOUNTER — Ambulatory Visit: Payer: Self-pay | Admitting: Family Medicine

## 2010-04-15 ENCOUNTER — Ambulatory Visit
Admission: RE | Admit: 2010-04-15 | Discharge: 2010-04-16 | Payer: Self-pay | Source: Home / Self Care | Attending: Urology | Admitting: Urology

## 2010-04-19 ENCOUNTER — Telehealth: Payer: Self-pay | Admitting: Family Medicine

## 2010-04-19 ENCOUNTER — Encounter: Payer: Self-pay | Admitting: Family Medicine

## 2010-04-23 ENCOUNTER — Ambulatory Visit
Admission: RE | Admit: 2010-04-23 | Discharge: 2010-04-23 | Payer: Self-pay | Source: Home / Self Care | Attending: Family Medicine | Admitting: Family Medicine

## 2010-05-20 ENCOUNTER — Encounter: Payer: Self-pay | Admitting: Family Medicine

## 2010-05-30 NOTE — Assessment & Plan Note (Signed)
Summary: BACK PAIN x 10 days   Vital Signs:  Patient profile:   77 year old male BP sitting:   144 / 85  Vitals Entered By: Lillia Pauls CMA (February 19, 2010 11:02 AM)  Primary Provider:  Paula Compton MD   History of Present Illness: 76 yo male with hx sciatic symptoms in the past recently treated with epidural spinal injections x2 for sciatic symptoms, now with new and different right mid back pain.  The patient is an avid golfer.  For the past 10 days he has been having pain along his right paraspinal muscles in the upper lumbar region.  He notes the pain is worst when playing golf and when driving the ball rather than chipping.  It gets better after he plays.  He also notes that the pain resolves completely when he is in the hot tub, only to return again when he goes back to golfing.  He tried 3d of rest last week but then when returned to golfing the pain returned.  Since then he has been trying relative rest and has been doing rotation exercises with a weighted ball.  Has not tried any analgesics for this but avoids NSAIDS 2/2 his CRI and has lots of tramadol at home which he hasn't used yet.  There is no radiation to the pain and he does not have any bowel or bladder problems.    Current Medications (verified): 1)  Avodart 0.5 Mg Caps (Dutasteride) 2)  Doxazosin Mesylate 8 Mg Tabs (Doxazosin Mesylate) .Marland Kitchen.. 1 By Mouth At Bedtime 3)  Simvastatin 40 Mg Tabs (Simvastatin) .... Sig: Take 1 Tab By Mouth Once Daily 4)  Felodipine 10 Mg Xr24h-Tab (Felodipine) .... Sig: Take 1 Tablet By Mouth Daily 5)  Polyethylene Glycol 3350  Powd (Polyethylene Glycol 3350) .... Sig: 17g (Heaping Teaspoon) Dissolved in 8 Oz Water, Drink Once Daily Disp: 1 Cannister 6)  Levitra 20 Mg Tabs (Vardenafil Hcl) .... Take 1/2 To 1 Tablet By Mouth At Bedtime As Needed 7)  Lisinopril 10 Mg Tabs (Lisinopril) .... Once Daily 8)  Multivitamins  Caps (Multiple Vitamin) 9)  Bayer Childrens Aspirin 81 Mg Chew  (Aspirin) 10)  Tramadol Hcl 50 Mg Tabs (Tramadol Hcl) .... One Tab By Mouth Q4-6h As Needed For Pain.  Allergies (verified): No Known Drug Allergies  Review of Systems       See HPI  Physical Exam  General:  Well-developed,well-nourished,in no acute distress; alert,appropriate and cooperative throughout examination Abdomen:  Bowel sounds positive,abdomen soft and non-tender without masses, organomegaly or hernias noted. Msk:  Lumbar spine normal to inspection. Good ROM to flexion, ext, rotation, and side bending. TTP along R longissimus in the upper lumbar and lower thoracic region.  Spasm noted in this region to palpation.  Also noted that facet joints locked when downward pressure placed on R paraspinal musculature. Neg seated and laying straight leg raise bilaterally. Neg trendelenburg sign. DTRs 1+ and symmetric. Strength 5/5 in both lower ext.   Impression & Recommendations:  Problem # 1:  BACK PAIN, LUMBAR (ICD-724.2) Symptoms suggestive of Right longissimus lumborum strain with paraspinal spasm and facet locking. Advised exercises to loosen low back and unlock facets (rocking back and forth on back, rolling ball between wall and back) advised cross stretches to focus on paraspinal muscles. Tramadol 50mg  q4-6h as needed . Heating pad/rice pack as often as tolerated to relieve spasm. Cont using weighted ball in gym to maintain rotation strength. RTC 1 month for fu.  His updated  medication list for this problem includes:    Bayer Childrens Aspirin 81 Mg Chew (Aspirin)    Tramadol Hcl 50 Mg Tabs (Tramadol hcl) ..... One tab by mouth q4-6h as needed for pain.  Complete Medication List: 1)  Avodart 0.5 Mg Caps (Dutasteride) 2)  Doxazosin Mesylate 8 Mg Tabs (Doxazosin mesylate) .Marland Kitchen.. 1 by mouth at bedtime 3)  Simvastatin 40 Mg Tabs (Simvastatin) .... Sig: take 1 tab by mouth once daily 4)  Felodipine 10 Mg Xr24h-tab (Felodipine) .... Sig: take 1 tablet by mouth daily 5)   Polyethylene Glycol 3350 Powd (Polyethylene glycol 3350) .... Sig: 17g (heaping teaspoon) dissolved in 8 oz water, drink once daily disp: 1 cannister 6)  Levitra 20 Mg Tabs (Vardenafil hcl) .... Take 1/2 to 1 tablet by mouth at bedtime as needed 7)  Lisinopril 10 Mg Tabs (Lisinopril) .... Once daily 8)  Multivitamins Caps (Multiple vitamin) 9)  Bayer Childrens Aspirin 81 Mg Chew (Aspirin) 10)  Tramadol Hcl 50 Mg Tabs (Tramadol hcl) .... One tab by mouth q4-6h as needed for pain.  Patient Instructions: 1)  Great to meet you today! 2)  You have spasm of your paraspinal muscles on the right as well as locked facet joints. 3)  Tramadol for pain. 4)  Heating pad as often as you can to loosen the muscles. 5)  Do the rocking motion we discussed to help unlock your facets. 6)  Do the cross stretch we discussed. 7)  Continue using the weighted ball in the gym. 8)  Try to find something that you can use to roll up and down your back. 9)  Come back to see [place] in 1 month to see how you are doing. 10)  -Dr. Benjamin Stain   Orders Added: 1)  Est. Patient Level III [84132]

## 2010-05-30 NOTE — Miscellaneous (Signed)
Summary: Handicapped Placard  Patient brought by handicapped placard to be filled out.  Please call him when completed. Paul [name]  September 28, 2009 11:37 AM form to Dr. Mauricio Po. he brought the temporary form. do you want the other form that is good for 4 years?Marland KitchenGolden Circle RN  September 28, 2009 11:56 AM  I will complete the 6 month form and reassess afterward. Paula Compton MD  September 28, 2009 12:51 PM informed to that it is ready for him to pick up.Golden Circle RN  September 28, 2009 2:08 PM

## 2010-05-30 NOTE — Miscellaneous (Signed)
Summary: prior auth nexium  Clinical Lists Changes I do not find an answer from [employer] in chart. pharmacy sent another request for it. I resent the fax today.Golden Circle RN  July 05, 2009 12:31 PM

## 2010-05-30 NOTE — Progress Notes (Signed)
Summary: Referral  Phone Note Call from Patient Call back at Home Phone [phone]   Summary of Call: Pt states Dr. Darrick Penna told him he would send him to a specialist for his knee.  Pt is ready for a referral to be made. Initial call taken by: Haydee Salter,  May 04, 2007 12:01 PM  Follow-up for Phone Call        dr alusio's office to review records i will fax over and call pt with appt time.  pt informed of this procedure and was ok with this. notes faxed to [phone] Follow-up by: Lillia Pauls CMA,  May 05, 2007 10:30 AM

## 2010-05-30 NOTE — Progress Notes (Signed)
Summary: requesting wi appt with fields  Phone Note Call from Patient Call back at Home Phone [phone]   Reason for Call: Talk to Nurse Summary of Call: pt is requesting wi appt with Dr. Darrick Penna, pt sts his finger has swollen up and hes not sure if its from gout or what? Initial call taken by: ERIN LEVAN,  May 12, 2007 3:39 PM  Follow-up for Phone Call        left message Follow-up by: Golden Circle RN,  May 12, 2007 3:46 PM  Additional Follow-up for Phone Call Additional follow up Details #1::        pt is returning call Additional Follow-up by: ERIN LEVAN,  May 12, 2007 3:52 PM    Additional Follow-up for Phone Call Additional follow up Details #2::    c/o swelling and pain at 2nd joint of ring finger x 2 days. not taking anything for pain. appt made for 8:45am this friday 16th Follow-up by: Golden Circle RN,  May 12, 2007 3:56 PM

## 2010-05-30 NOTE — Miscellaneous (Signed)
  Clinical Lists Changes     Patient prior authorization for omeprazole was denied by [employer].  Letter received in our office stating that Prior Authorization is covered for severe/atypical GERD with related disorders (erosive esophagitis, laryngopharyngeal reflux or other supraesophageal symptoms(cough, asthma), symptomatic hiatal hernia, esophageal stricture) moderate GERD with daily/debilitating symptoms having failed a 30-day trail of high dose H2 blockers, or peptic ulcer disease, Barrett's esophagus or hypersecretory conditions, or prevention of NSAID or steroid related ulcer.  Pt called today by Molly Maduro, denies use of H2blockers or chronic NSAID use.  Using Zegerid otc recommended by Dr. Matthias Hughs.  Paula Compton MD  June 22, 2009 12:25 PM

## 2010-05-30 NOTE — Letter (Signed)
Summary: New Patient letter  [facility]  [address] [place], [place] [address]   Phone: [phone]  Fax: [phone]       05/16/2009 MRN: [id number]  Paul [name] [address] [place], [place]  [address]  Dear Paul [name],  Welcome to the Gastroenterology Division at [facility].    You are scheduled to see Dr.  Arlyce Dice on 06-04-09 at 10AM on the 3rd floor at [facility], [address].  We ask that you try to arrive at our office 15 minutes prior to your appointment time to allow for check-in.  We would like you to complete the enclosed self-administered evaluation form prior to your visit and bring it with you on the day of your appointment.  We will review it with you.  Also, please bring a complete list of all your medications or, if you prefer, bring the medication bottles and we will list them.  Please bring your insurance card so that we may make a copy of it.  If your insurance requires a referral to see a specialist, please bring your referral form from your primary care physician.  Co-payments are due at the time of your visit and may be paid by cash, check or credit card.     Your office visit will consist of a consult with your physician (includes a physical exam), any laboratory testing he/she may order, scheduling of any necessary diagnostic testing (e.g. x-ray, ultrasound, CT-scan), and scheduling of a procedure (e.g. Endoscopy, Colonoscopy) if required.  Please allow enough time on your schedule to allow for any/all of these possibilities.    If you cannot keep your appointment, please call [phone] to cancel or reschedule prior to your appointment date.  This allows [place] the opportunity to schedule an appointment for another patient in need of care.  If you do not cancel or reschedule by 5 p.m. the business day prior to your appointment date, you will be charged a $50.00 late cancellation/no-show fee.    Thank you for choosing [facility] for your medical needs.  We appreciate the opportunity to care for you.  Please visit [place] at our website  to learn more about our practice.                     Sincerely,                                                             The Gastroenterology Division

## 2010-05-30 NOTE — Letter (Signed)
Summary: [facility]  [facility]   Imported By: Marily Memos 11/14/2009 10:13:22  _____________________________________________________________________  External Attachment:    Type:   Image     Comment:   External Document

## 2010-05-30 NOTE — Progress Notes (Signed)
  Phone Note Outgoing Call   Call placed by: Paula Compton MD,  Sep 07, 2009 8:36 AM Call placed to: Patient Summary of Call: Called patient at [phone] to report results.  I left voice message.  Will send letter.  Initial call taken by: Paula Compton MD,  Sep 07, 2009 8:37 AM

## 2010-05-30 NOTE — Assessment & Plan Note (Signed)
Summary: 3:15 APPT,SCIATICA PAIN,MC   Vital Signs:  Patient profile:   76 year old male BP sitting:   133 / 73  Vitals Entered By: Lillia Pauls CMA (December 20, 2009 3:38 PM)  Primary Provider:  Paula Compton MD   History of Present Illness: Now has had 2 epidural injections for RT L4/5 disk and L5 radiculopathy improved walking p one musch improved p two mid august  has been able to walk 30 mins continuously  now not much of same pain but tightness in calf muscle  off percocet and other meds for pain  Allergies: No Known Drug Allergies  Physical Exam  General:  Well-developed,well-nourished,in no acute distress; alert,appropriate and cooperative throughout examination Msk:  neg SLR now No TTP low back slt weak on toe walking on rt slt weak on heel walk  step up x 3 he gets fatigued on RT  calf - No TTP Thigh - no TTP but feels deep pain   Impression & Recommendations:  Problem # 1:  BACK PAIN, LUMBAR, WITH RADICULOPATHY (ICD-724.4) Assessment Improved  The following medications were removed from the medication list:    Percocet 5-325 Mg Tabs (Oxycodone-acetaminophen) ..... Sig: take 1/2 to 1 tablet by mouth every 8 hours as needed for extreme pain His updated medication list for this problem includes:    Bayer Childrens Aspirin 81 Mg Chew (Aspirin)  restart exercise program progress gradually  heel felt cushion added to orthotics and shoes. feels better back support with this  see me in  ~ 2 mos  Complete Medication List: 1)  Avodart 0.5 Mg Caps (Dutasteride) 2)  Doxazosin Mesylate 8 Mg Tabs (Doxazosin mesylate) .Marland Kitchen.. 1 by mouth at bedtime 3)  Simvastatin 40 Mg Tabs (Simvastatin) .... Sig: take 1 tab by mouth once daily 4)  Felodipine 10 Mg Xr24h-tab (Felodipine) .... Sig: take 1 tablet by mouth daily 5)  Polyethylene Glycol 3350 Powd (Polyethylene glycol 3350) .... Sig: 17g (heaping teaspoon) dissolved in 8 oz water, drink once daily disp: 1 cannister 6)   Levitra 20 Mg Tabs (Vardenafil hcl) .... Take 1/2 to 1 tablet by mouth at bedtime as needed 7)  Lisinopril 10 Mg Tabs (Lisinopril) .... Once daily 8)  Multivitamins Caps (Multiple vitamin) 9)  Bayer Childrens Aspirin 81 Mg Chew (Aspirin) Prescriptions: SIMVASTATIN 40 MG TABS (SIMVASTATIN) SIG: Take 1 tab by mouth once daily  #30 x 11   Entered by:   Lillia Pauls CMA   Authorized by:   [name] MD   Signed by:   Lillia Pauls CMA on 12/20/2009   Method used:   Electronically to        [facility] [phone]* (retail)       [address] [place], [place]  [address]       Ph: [phone]       Fax: [phone]   RxID:   [id number]

## 2010-05-30 NOTE — Consult Note (Signed)
Summary: [facility]   Imported By: Bradly Bienenstock 04/26/2010 09:12:53  _____________________________________________________________________  External Attachment:    Type:   Image     Comment:   External Document

## 2010-05-30 NOTE — Assessment & Plan Note (Signed)
Summary: BP CHECK/RH  Nurse Visit Patient in for BP check. BP checked manually with regular adult cuff. BP RA 126/72, LA 132/78 pulse 72. patient has taken meds this AM . will forward to MD. Theresia Lo RN  April 01, 2010 11:47 AM   Allergies: No Known Drug Allergies  Orders Added: 1)  No Charge Patient Arrived (NCPA0) [NCPA0] Will continue with same dosing of BP meds as he is currently taking. Paula Compton MD  April 01, 2010 2:02 PM

## 2010-05-30 NOTE — Miscellaneous (Signed)
Summary: prior auth omeprazole  Clinical Lists Changes [employer] form to pcp to sign.Golden Circle RN  Aug 31, 2009 10:45 AM     Signed, to be faxed. Paula Compton MD  Aug 31, 2009 1:39 PM  Appended Document: prior auth omeprazole I refaxed it again today as we have not heard back  Appended Document: prior auth omeprazole faxed another prior auth for this med

## 2010-05-30 NOTE — Progress Notes (Signed)
  Phone Note Call from Patient   Summary of Call: REQUESTING TO SPEAK WITH DR Sanyia Dini RE: 2ND OPINION HE RECEIVED FROM MD TODAY.  DR Marquette Blodgett MAY CALL PT AFTER HOURS AT [phone] Initial call taken by: Dedra Skeens CMA,,  May 26, 2007 5:19 PM  Follow-up for Phone Call        fwd to dr.Rodderick Holtzer Follow-up by: Arlyss Repress CMA,,  May 27, 2007 9:25 AM  Additional Follow-up for Phone Call Additional follow up Details #1::        I called

## 2010-05-30 NOTE — Assessment & Plan Note (Signed)
Summary: acid reflux,tcb   Vital Signs:  Patient profile:   76 year old male Height:      68.5 inches Weight:      205 pounds BMI:     30.83 Temp:     96 degrees F oral Pulse rate:   59 / minute BP sitting:   145 / 81  (left arm) Cuff size:   regular  Vitals Entered By: Tessie Fass CMA (March 09, 2009 11:32 AM) CC: GERD Is Patient Diabetic? No Pain Assessment Patient in pain? no        Primary Care Provider:  Paula Compton MD  CC:  GERD.  History of Present Illness: CC: GERD  76yo with self diagnosed GERD.  States that over last month, has had epigastric "esophageal" burning as well as some bloating in belly.  h/o reflux 14 yrs ago, got better.  Notes more when mexican/spicy food.  Also endorese recently starting coffee daily (since summer) and starting to drink more (wine with dinner).  Also eats meals just before bedtime and then wakes up with burning sensation.  Has tried omeprazole 20mg  OTC x last 8 days, noted some improvement.  No f/c/n/v/d.  + constipation but on PEG daily which makes him have 2-3 loose stools daily.  as far as HTN, on several meds but confusion abut whether to take lisinopril alone or lisinopril/HCTZ.  currently only taking lisinopril and would like refill.  Habits & Providers  Alcohol-Tobacco-Diet     Tobacco Status: quit  Current Medications (verified): 1)  Avodart 0.5 Mg Caps (Dutasteride) 2)  Doxazosin Mesylate 8 Mg Tabs (Doxazosin Mesylate) 3)  Felodipine 10 Mg Tb24 (Felodipine) 4)  Simvastatin 40 Mg Tabs (Simvastatin) .... Sig: Take 1 Tab By Mouth Once Daily 5)  Lisinopril 10 Mg  Tabs (Lisinopril) .Marland Kitchen.. 1 By Mouth Daily 6)  Colchicine 0.6 Mg  Tabs (Colchicine) .Marland Kitchen.. 1 By Mouth Bid 7)  Lisinopril-Hydrochlorothiazide 10-12.5 Mg  Tabs (Lisinopril-Hydrochlorothiazide) .... Take One Tablet Daily 8)  Diazepam 5 Mg Tabs (Diazepam) .Marland Kitchen.. 1 By Mouth At Bedtime As Needed 9)  Tramadol Hcl 50 Mg  Tabs (Tramadol Hcl) .Marland Kitchen.. 1 By Mouth Q 6 Hrs As  Needed 10)  Felodipine 10 Mg Xr24h-Tab (Felodipine) .... Sig: Take 1 Tablet By Mouth Daily 11)  Polyethylene Glycol 3350  Powd (Polyethylene Glycol 3350) .... Sig: 17g (Heaping Teaspoon) Dissolved in 8 Oz Water, Drink Once Daily Disp: 1 Cannister 12)  Voltaren 1 % Gel (Diclofenac Sodium) .... Apply 1g To Affected Area Four Times Daily Disp: 80 G or Largest 13)  Levitra 20 Mg Tabs (Vardenafil Hcl) .... Take 1/2 To 1 Tablet By Mouth At Bedtime As Needed 14)  Omeprazole 40 Mg Cpdr (Omeprazole) .... Take One By Mouth Daily  Allergies (verified): No Known Drug Allergies  Past History:  Past medical, surgical, family and social histories (including risk factors) reviewed for relevance to current acute and chronic problems.  Past Medical History: Reviewed history from 10/07/2007 and no changes required. cardiac cath 1978 - normal,  creatinine 1.6 in 2006 etoh - quit 6 yrs ago, hemorrhoids 2000  smoked for 10 yrs quit 1974  tinea cruris  Past Surgical History: Reviewed history from 10/07/2007 and no changes required. 05-26-06 TC 189, TG 63, HDL 60, LDL 116 - 05/27/2006 echo  57% - 03/16/2003  ett 7 2001 highfitness/ no CP - 11/20/2000, ett feb 2000 high fitness level , ett july 2006 hgh fitness level - 11/12/2004 flex sig 1998 -\par colonsocopy 2006 by  buccini  Family History: Reviewed history from 10/07/2007 and no changes required. 3 male sibs died - 2 with cancer,  3 male sibs died with CVD and DM,  6 male sibs 1 panc ca/ 1 bone ca/ 1 with MI  69 male sibs/ 3 with mis/ 1 aodm  father died kidney failure in 65s  mother died with aodm comps in 51s  Social History: Reviewed history from 05/09/2008 and no changes required. professor at [employer];  avid golfer;  no ETOH or tobacco commutes to work from [employer] smoking in the 1970s.   Physical Exam  General:  Well-developed,well-nourished,in no acute distress; alert,appropriate and cooperative throughout examination Lungs:   Normal respiratory effort, chest expands symmetrically. Lungs are clear to auscultation, no crackles or wheezes. Heart:  Normal rate and regular rhythm. S1 and S2 normal without gallop, murmur, click, rub or other extra sounds. Abdomen:  Bowel sounds positive,abdomen soft and non-tender without masses, organomegaly or hernias noted.  no cva tenderness.  no rebound/guarding.   Impression & Recommendations:  Problem # 1:  GERD (ICD-530.81)  discussed omeprazole 40mg  daily, as well as preventative measures (see instructions.).  f/u with PCP 1 mo to discuss HTN and f/u GERD.  His updated medication list for this problem includes:    Omeprazole 40 Mg Cpdr (Omeprazole) .Marland Kitchen... Take one by mouth daily  Orders: [facility]- Est Level  3 ([address])  Complete Medication List: 1)  Avodart 0.5 Mg Caps (Dutasteride) 2)  Doxazosin Mesylate 8 Mg Tabs (Doxazosin mesylate) 3)  Felodipine 10 Mg Tb24 (Felodipine) 4)  Simvastatin 40 Mg Tabs (Simvastatin) .... Sig: take 1 tab by mouth once daily 5)  Lisinopril 10 Mg Tabs (Lisinopril) .Marland Kitchen.. 1 by mouth daily 6)  Colchicine 0.6 Mg Tabs (Colchicine) .Marland Kitchen.. 1 by mouth bid 7)  Lisinopril-hydrochlorothiazide 10-12.5 Mg Tabs (Lisinopril-hydrochlorothiazide) .... Take one tablet daily 8)  Diazepam 5 Mg Tabs (Diazepam) .Marland Kitchen.. 1 by mouth at bedtime as needed 9)  Tramadol Hcl 50 Mg Tabs (Tramadol hcl) .Marland Kitchen.. 1 by mouth q 6 hrs as needed 10)  Felodipine 10 Mg Xr24h-tab (Felodipine) .... Sig: take 1 tablet by mouth daily 11)  Polyethylene Glycol 3350 Powd (Polyethylene glycol 3350) .... Sig: 17g (heaping teaspoon) dissolved in 8 oz water, drink once daily disp: 1 cannister 12)  Voltaren 1 % Gel (Diclofenac sodium) .... Apply 1g to affected area four times daily disp: 80 g or largest 13)  Levitra 20 Mg Tabs (Vardenafil hcl) .... Take 1/2 to 1 tablet by mouth at bedtime as needed 14)  Omeprazole 40 Mg Cpdr (Omeprazole) .... Take one by mouth daily  Patient Instructions: 1)  Sounds like  you do  have reflux disease. 2)  Try the higher strength omeprazole for the next several weeks and follow up with Dr. Mauricio Po. 3)  Remember - head of bed elevated to help decrease symptoms at night, as well as no food 2 hours before eating. 4)  Try to stay away from spicy, acidic foods.  5)  Call clinic with questions.  Pleasure to meet you today! Prescriptions: LISINOPRIL 10 MG  TABS (LISINOPRIL) 1 by mouth daily  #30 x 3   Entered and Authorized by:   Eustaquio Boyden  MD   Signed by:   Eustaquio Boyden  MD on 03/09/2009   Method used:   Print then Give to Patient   RxID:   [id number] OMEPRAZOLE 40 MG CPDR (OMEPRAZOLE) take one by mouth daily  #31 x 3   Entered  and Authorized by:   Eustaquio Boyden  MD   Signed by:   Eustaquio Boyden  MD on 03/09/2009   Method used:   Print then Give to Patient   RxID:   [id number]

## 2010-05-30 NOTE — Consult Note (Signed)
[facility]  [facility]   Imported By: Clydell Hakim 10/10/2009 13:42:58  _____________________________________________________________________  External Attachment:    Type:   Image     Comment:   External Document

## 2010-05-30 NOTE — Assessment & Plan Note (Signed)
Summary: wrist injury wp   Vital Signs:  Patient Profile:   76 Years Old Male Height:     68.5 inches Weight:      195 pounds Pulse rate:   71 / minute BP sitting:   126 / 74  Vitals Entered By: Lillia Pauls CMA (December 15, 2006 9:44 AM)               Chief Complaint:  R WRIST PAIN X 2WKS.  History of Present Illness: 76 y/o AAM comes in for 2 week h/o right wrist pain on the ulnar aspect. Has no previous injuries. Pt is an avid golfer and played 5 days straight 2 weeks ago before pain started. Occurs when striking golf ball. Has obtained OTC wrist splint mainly used for carpal tunnel and has had no relief. Has also tried occasional icing.  Also has back pain that radiates to right lower leg after long day of putting. Has mentioned that he would like to pursue massage therapy since we recommended this at previous visit.   Current Allergies: No known allergies        Detailed Back/Spine Exam  Lumbosacral Exam:  Inspection-deformity:    Normal Palpation-spinal tenderness:  Normal Range of Motion:    Forward Flexion:   25 degrees    Hyperextension:   25 degrees Sitting Straight Leg Raise:    Right:  negative Reverse Straight Leg Raise:    Right:  negative Sciatic Notch:    There is right sciatic notch tenderness.   Wrist/Hand Exam  Vascular:    Radial, ulnar, brachial, and axillary pulses 2+ and symmetric; capillary refill less than 2 seconds; no evidence of ischemia, clubbing, or cyanosis.    Wrist Exam:    Right:    Inspection:  Normal    Stability:  stable    Tenderness:  right ulnar styloid    Swelling:  no    Erythema:  no    pain reproduced with ulnar deviation and wrist extension at ulnar aspect    Range of Motion:       Flexion-Active: 25 degrees       Extension-Active: 25 degrees       Radial Deviation-Active: 25 degrees       Ulnar Deviation-Active: 25 degrees  Tinel's:    Tinel's negative over cubital, pronator, carpal, and Guyon's area.     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 Apply topical Voltaren gel 1% QID Apply ice to wrist BID  Problem # 2:  SCIATICA, RIGHT (ICD-724.3) This is probable right piriformis syndrome. He will be referred to Missy Sabins for massage therapy. Continue piriformis stretches. Orders: [facility]- Est Level  3 ([address])   Complete Medication List: 1)  Avodart 0.5 Mg Caps (Dutasteride) 2)  Doxazosin Mesylate 8 Mg Tabs (Doxazosin mesylate) 3)  Felodipine 10 Mg Tb24 (Felodipine) 4)  Lisinopril-hydrochlorothiazide 10-12.5 Mg Tabs (Lisinopril-hydrochlorothiazide) .... Take 1 tablet by mouth once a day 5)  Simvastatin 40 Mg Tabs (Simvastatin)   Patient Instructions: 1)  Please schedule a follow-up appointment as needed.

## 2010-05-30 NOTE — Progress Notes (Signed)
Summary: NEED RECORDS  Phone Note Other Incoming Call back at [phone]   Call placed by: LINDA @ [facility] Summary of Call: PT COMING IN TOMORROW FOR PRE OPT.  NEED EKG W/LINES & CHEST X-RAY FAXED TO [phone]. Initial call taken by: Levada Schilling,  Sep 07, 2007 11:48 AM  Follow-up for Phone Call         faxed last EKG we have on file which is from 2004. last chest xray on file is from 2004. did not fax chest xray , will await call back if needed. Follow-up by: Theresia Lo RN,  Sep 07, 2007 12:00 PM

## 2010-05-30 NOTE — Assessment & Plan Note (Signed)
Summary: pain behind lt knee x 1 week/ls   Vital Signs:  Patient Profile:   76 Years Old Male Height:     68.5 inches Weight:      200 pounds Pulse rate:   75 / minute BP sitting:   126 / 77  Vitals Entered By: Lillia Pauls CMA (February 09, 2007 1:40 PM)                 PCP:  [name] MD  Chief Complaint:  L POSTERIOR KNEE PAIN X 1 WEEK.  History of Present Illness: Paul [name] is a 76 year old male who complains of left posterior knee pain x 1 week.  He does not remember a specific injury, but full flexion at the knee exacerbates his pain.  He describes it as an ache that comes and goes.  He reports that it feels stiff after he sits for a long period and when he gets up in the AM.   Denies cracking or popping, but does state that he felt it give way once 2 days ago.  He does not lift weights, but does do pilates.  He is very active, but has not added anything new to his regimen lately.  Of note, he commutes one hour each way to work, and recently drove to [place] to give a presentation.  Current Allergies: No known allergies       Physical Exam  General:     Well-developed,well-nourished,in no acute distress; alert,appropriate and cooperative throughout examination Msk:     left knee: small fluid collection in posterior fossa, mildly tender to palpation.  Nontender over the medial and lateral joint lines.  Nontender over the patella.  Full ROM to flexion and extension with some discomfort with full flexion.  Stable to varus and valgus stress.  Anterior and posterior drawer negative.  Lochman's negative.  McMurray's negative.  Baker's cyst clearly seen on ultrasound. Right knee: as above, except for fluid collection/baker's cyst    Impression & Recommendations:  Problem # 1:  BAKER'S CYST, LEFT KNEE (ICD-727.51) Exam and ultrasound consistent with presence of baker's cyst on the left.  No evidence of meniscal or ligamentous injury.  Likely irriated cartilege during recent  long road trips.  Instructed to wear brace during more strenuous physical activity and instructed to perform hamstring curls and calf raises to hasten resolution of cyst. Orders: [facility]- Est Level  3 ([address])   Complete Medication List: 1)  Avodart 0.5 Mg Caps (Dutasteride) 2)  Doxazosin Mesylate 8 Mg Tabs (Doxazosin mesylate) 3)  Felodipine 10 Mg Tb24 (Felodipine) 4)  Lisinopril-hydrochlorothiazide 10-12.5 Mg Tabs (Lisinopril-hydrochlorothiazide) .... Take 1 tablet by mouth once a day 5)  Simvastatin 40 Mg Tabs (Simvastatin)     ]

## 2010-05-30 NOTE — Consult Note (Signed)
[facility]: [facility]   Imported By: Clydell Hakim 06/29/2009 16:15:39  _____________________________________________________________________  External Attachment:    Type:   Image     Comment:   External Document

## 2010-05-30 NOTE — Progress Notes (Signed)
Summary: requesting advice  Phone Note Call from Patient Call back at Home Phone [phone]   Reason for Call: Talk to Nurse Summary of Call: pt is calling to let MD know his hand was great after he left, but he sts he played golf sat. and now its flared up again, wants to know what md suggests? Initial call taken by: ERIN LEVAN,  January 13, 2007 4:35 PM    advised to rest wrist this week.  use warm water; continue ball squeezes.  re ck if probs.

## 2010-05-30 NOTE — Assessment & Plan Note (Signed)
Summary: gout   Vital Signs:  Patient profile:   76 year old male Height:      68.5 inches Weight:      205 pounds BMI:     30.83 Temp:     97.5 degrees F oral Pulse rate:   79 / minute BP sitting:   167 / 67  (left arm) Cuff size:   regular  Vitals Entered By: Tessie Fass CMA (April 23, 2010 9:32 AM) CC: gout   Primary Care Provider:  Paula Compton MD  CC:  gout.  History of Present Illness:  gout: pain no improved, only occasional pain in 4th finger of left hand with gripping motion. 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  Otherwise pain free after steroid use since 12/23.  Pt has had gout flare in this joint in the past and knows s/s of flare.  Tried to come to clinic on 12/23 but we were closed.  Recently had turp procedure and had follow up appt with urologist on 12/23.  The urologist called on call emergency line ( see phone note) and discussed with on call doctor.  From that discuss on call physican understood that pt had failed normal colchicine therapy that he normally takes.  This was not the case...he did not take colchicine b/c it had expired and was afriad to take it.  Pt in today to see if he can stop the prednisone since gout flare resolved and if he can get another prescription for colchicine.    HTN: bp elevated today.  right arm recheck 160/90, left arm 150/80.  pt states that he has a bp machine at home that he uses to monitor bp.  It has been good recently.  SBP in 130's.  At last appt also in 130's.    Current Medications (verified): 1)  Avodart 0.5 Mg Caps (Dutasteride) 2)  Doxazosin Mesylate 8 Mg Tabs (Doxazosin Mesylate) .Marland Kitchen.. 1 By Mouth At Bedtime 3)  Simvastatin 40 Mg Tabs (Simvastatin) .... Sig: Take 1 Tab By Mouth Once Daily 4)  Felodipine 10 Mg Xr24h-Tab (Felodipine) .... Sig: Take 1 Tablet By Mouth Daily 5)  Polyethylene Glycol 3350  Powd (Polyethylene Glycol 3350) .... Sig: 17g  (Heaping Teaspoon) Dissolved in 8 Oz Water, Drink Once Daily Disp: 1 Cannister 6)  Levitra 20 Mg Tabs (Vardenafil Hcl) .... Take 1/2 To 1 Tablet By Mouth At Bedtime As Needed 7)  Multivitamins  Caps (Multiple Vitamin) 8)  Bayer Childrens Aspirin 81 Mg Chew (Aspirin) 9)  Tramadol Hcl 50 Mg Tabs (Tramadol Hcl) .... One Tab By Mouth Q4-6h As Needed For Pain. 10)  Lisinopril 20 Mg Tabs (Lisinopril) .Marland Kitchen.. 1 By Mouth Once Daily 11)  Colcrys 0.6 Mg Tabs (Colchicine) .... Take 1 Tablet Bid  Allergies (verified): No Known Drug Allergies  Review of Systems       no fever. no joint pain. no problems with appetite.  Physical Exam  General:  VS stable, bp elevated- see hpi and intake vital signs Well-developed,well-nourished,in no acute distress; alert,appropriate and cooperative throughout examination Msk:  enlarged PIP joint of 4th digit of left hand, no redness or pain with palpation of joint.  some decrease in rom-pt states rom is at baseline.   Impression & Recommendations:  Problem # 1:  GOUT, UNSPECIFIED (ICD-274.9)  gout flare now improved.  will stop steroids today (started on 12/23). gave refill of cochicine.  instructed pt to  take as his previous regimen if any return of gout flare.  Pt is to return for any new or worsening of symptoms.   His updated medication list for this problem includes:    Colcrys 0.6 Mg Tabs (Colchicine) .Marland Kitchen... Take 1 tablet bid  Orders: [facility]- Est Level  3 ([address])  Complete Medication List: 1)  Avodart 0.5 Mg Caps (Dutasteride) 2)  Doxazosin Mesylate 8 Mg Tabs (Doxazosin mesylate) .Marland Kitchen.. 1 by mouth at bedtime 3)  Simvastatin 40 Mg Tabs (Simvastatin) .... Sig: take 1 tab by mouth once daily 4)  Felodipine 10 Mg Xr24h-tab (Felodipine) .... Sig: take 1 tablet by mouth daily 5)  Polyethylene Glycol 3350 Powd (Polyethylene glycol 3350) .... Sig: 17g (heaping teaspoon) dissolved in 8 oz water, drink once daily disp: 1 cannister 6)  Levitra 20 Mg Tabs (Vardenafil  hcl) .... Take 1/2 to 1 tablet by mouth at bedtime as needed 7)  Multivitamins Caps (Multiple vitamin) 8)  Bayer Childrens Aspirin 81 Mg Chew (Aspirin) 9)  Tramadol Hcl 50 Mg Tabs (Tramadol hcl) .... One tab by mouth q4-6h as needed for pain. 10)  Lisinopril 20 Mg Tabs (Lisinopril) .Marland Kitchen.. 1 by mouth once daily 11)  Colcrys 0.6 Mg Tabs (Colchicine) .... Take 1 tablet bid  Patient Instructions: 1)  return as needed for new or worseing of symptoms 2)  you can stop taking the steroid 3)  use colchicine as in the past for any gout flare. Prescriptions: COLCRYS 0.6 MG TABS (COLCHICINE) take 1 tablet BID  #30 x 2   Entered and Authorized by:   Ellin Mayhew MD   Signed by:   Ellin Mayhew MD on 04/23/2010   Method used:   Electronically to        [facility] [phone]* (retail)       [address]       [place], [place]  [address]       Ph: [phone]       Fax: [phone]   RxID:   [id number]    Orders Added: 1)  [facility]- Est Level  3 [[address]]

## 2010-05-30 NOTE — Consult Note (Signed)
Summary: [facility]   Imported By: Haydee Salter 04/06/2008 15:57:18  _____________________________________________________________________  External Attachment:    Type:   Image     Comment:   External Document

## 2010-05-30 NOTE — Assessment & Plan Note (Signed)
Summary: 4PM-LOWER BACK PAIN,MC   Vital Signs:  Patient profile:   76 year old male BP sitting:   128 / 76  Vitals Entered By: Lillia Pauls CMA (Aug 30, 2009 4:33 PM)  Primary Provider:  Paula Compton MD   History of Present Illness: Persistence of LBP This radiates down RT leg morning is doing the stretches and HEP just to get out of bed still playing golf but did have to modify swing and quit after 10 holes last week  no bowel, bladder chg no fever no red flags  Allergies: No Known Drug Allergies  Physical Exam  General:  Well-developed,well-nourished,in no acute distress; alert,appropriate and cooperative throughout examination Msk:  neg SLR fair motion in all planes although he feel s tight w this FABER is very tight on RT better on left Flexion exercises non painful   all strength testing OK Reflexes OK x only 1 + RT knee / lt knee is absent but has had TKR Additional Exam:  LUMBAR Xrays  severe DDD marked DJD changes as well there is narrowing of multiple foramina for nerve exit     Impression & Recommendations:  Problem # 1:  BACK PAIN, LUMBAR, WITH RADICULOPATHY (ICD-724.4)  His updated medication list for this problem includes:    Bayer Childrens Aspirin 81 Mg Chew (Aspirin)  Orders: Radiology other (Radiology Other)  will treat this w HEP  add neurontin to see if we can block radicular sxs  modify actiivty pending sxs  Problem # 2:  DEGENERATIVE DISC DISEASE, LUMBAR SPINE (ICD-722.52) This is impressive on XRay  will discuss with patient  nto many optinos beyond pain relief HEP  consider soem water exercises  reck in 4 wks  Complete Medication List: 1)  Avodart 0.5 Mg Caps (Dutasteride) 2)  Doxazosin Mesylate 8 Mg Tabs (Doxazosin mesylate) 3)  Simvastatin 40 Mg Tabs (Simvastatin) .... Sig: take 1 tab by mouth once daily 4)  Felodipine 10 Mg Xr24h-tab (Felodipine) .... Sig: take 1 tablet by mouth daily 5)  Polyethylene Glycol 3350  Powd (Polyethylene glycol 3350) .... Sig: 17g (heaping teaspoon) dissolved in 8 oz water, drink once daily disp: 1 cannister 6)  Levitra 20 Mg Tabs (Vardenafil hcl) .... Take 1/2 to 1 tablet by mouth at bedtime as needed 7)  Omeprazole 40 Mg Cpdr (Omeprazole) .... Take one by mouth daily 8)  Lisinopril 10 Mg Tabs (Lisinopril) .... Once daily 9)  Multivitamins Caps (Multiple vitamin) 10)  Bayer Childrens Aspirin 81 Mg Chew (Aspirin) 11)  Nexium 40 Mg Cpdr (Esomeprazole magnesium) .... Sig: take 1 by mouth one time daily 12)  Gabapentin 300 Mg Caps (Gabapentin) .Marland Kitchen.. 1 by mouth tid  Patient Instructions: 1)  take 1 gabapentin at night until you leave 2)  if not too drowsy or other sideeffects - mood, night mares, etc 3)  second week take 1 two times a day 4)  third week try 1 by mouth three times a day 5)  see me in 4 weeks Prescriptions: GABAPENTIN 300 MG CAPS (GABAPENTIN) 1 by mouth tid  #90 x 2   Entered by:   Lillia Pauls CMA   Authorized by:   [name] MD   Signed by:   Lillia Pauls CMA on 08/30/2009   Method used:   Print then Give to Patient   RxID:   [id number] GABAPENTIN 300 MG CAPS (GABAPENTIN) 1 by mouth tid  #90 x 2   Entered and Authorized by:   [name] MD  Signed by:   [name] MD on 08/30/2009   Method used:   Print then Give to Patient   RxID:   [id number]

## 2010-05-30 NOTE — Miscellaneous (Signed)
Summary: HAND SPECIALIST APPT  APPT WITH DR GRAMIG (HAND SPECIALIST) ON: THURS 10.16.08 @ 10:30 ARRIVE ABOUT 15 MINS EARLY [address]  [phone] PT MAILED APPT

## 2010-05-30 NOTE — Progress Notes (Signed)
----   Converted from flag ---- ---- 10/04/2007 2:33 PM, [name] MD wrote:  before his routine visit check labs drawn in hospital - had surgery early May - we should not repeat but prob needs lipid profile - may or may not need psa, cmet, cbc ------------------------------  pt had b-met, cbc. i called pt to sched. lab appt. will put in future order for psa, c-met and lipids............Paul [name]

## 2010-05-30 NOTE — Letter (Signed)
Summary: Generic Letter  [facility]  [address]   [place], [place] [address]   Phone: [phone]  Fax: [phone]    05/10/2009  Gardner Saab [address] [place], [place]  [address]  Dear Dr. Willette Brace,   I hope this letter finds you well.  I reviewed the results of yesterday's labwork and today's abdominal ultrasound, and I write with good news.  The Ultrasound does not give any finding that explains your abdominal pain.  Likewise, the lab work is not remarkable for any abnormalities.  I ask that you please be in contact with me after the evaluation by the gastroenterologist, or for any other needs you may have in the interim.     Sincerely,   Paula Compton MD  Appended Document: Generic Letter mailed

## 2010-05-30 NOTE — Progress Notes (Signed)
Summary: Lab work  Phone Note Call from Patient Call back at [employer] [phone]   Summary of Call: Pt wants to know if Dr. Darrick Penna will put in a lab order for him for yearly physical labwork. Initial call taken by: Haydee Salter,  October 04, 2007 2:14 PM  Follow-up for Phone Call        Pt has an appt June 11,2009 and wants his labs drawn befor the appt.  Follow-up by: Alphia Kava,  October 04, 2007 2:18 PM  Additional Follow-up for Phone Call Additional follow up Details #1::        we will check what labs he had during recent hospital stay for knee surgery.

## 2010-05-30 NOTE — Progress Notes (Signed)
Summary: WI request  Phone Note Call from Patient   Summary of Call: Pt is requesting to be seen today for his left knee being swollen and very sore.  Pt is coming in this afternoon at 3:10pm. Initial call taken by: Haydee Salter,  February 18, 2007 11:35 AM

## 2010-05-30 NOTE — Consult Note (Signed)
Summary: [facility]   Imported By: Clydell Hakim 11/29/2008 15:49:21  _____________________________________________________________________  External Attachment:    Type:   Image     Comment:   External Document

## 2010-05-30 NOTE — Assessment & Plan Note (Signed)
Summary: F/U PER Kristiann Noyce/  DPG   Vital Signs:  Patient Profile:   76 Years Old Male Height:     68.5 inches Weight:      205 pounds Pulse rate:   90 / minute BP sitting:   133 / 78  Vitals Entered By: Lillia Pauls CMA (May 21, 2007 11:13 AM)                 PCP:  [name] MD  Chief Complaint:  fu left knee pain.  History of Present Illness: F/u of gout attack got dramatic relief with prednisone 60 mg once daily x 5 days however, 2 days after stopping the left 4th pip swelled again and the left knee also swelled more lt knee was painful before gout flared and is only slighlty more painful than before  no fever chills or systemic sxs  note he had uric acid of 8.3 we stopped his HCTZ and BP has been OK no prior hx of gout  given low uric acid diet but he still is eating fair amount of meat  Current Allergies: No known allergies       Physical Exam  General:     Well-developed,well-nourished,in no acute distress; alert,appropriate and cooperative throughout examination Head:     Normocephalic and atraumatic without obvious abnormalities. No apparent alopecia or balding. Msk:     Lt 4th PIP swollen, red and warm but not equisitely tender flexion contracture - note had mild flex change before gout attack  Lt knee shows moderate effusion, mil warmth and moderate baker's cyst not painful to easy ROM    Impression & Recommendations:  Problem # 1:  GOUT, UNSPECIFIED (ICD-274.9)  His updated medication list for this problem includes:    Colchicine 0.6 Mg Tabs (Colchicine) .Marland Kitchen... 1 by mouth bid  will use another prednisone burst if needed but prefer to limit this with his hx of possible AVN in left knee Orders: [facility]- Est Level  2 ([address])  to see me in 1 month prob can drop colchicine to once daily at that point follow uric acid and gradually wean if induced by hctz   Problem # 2:  OSTEOARTHRITIS, KNEE, LEFT (ICD-715.96) plans to see frank alusio  next week for opinion on knee replacement he seems inclinced to move ahead with this. Orders: [facility]- Est Level  2 ([address])   Complete Medication List: 1)  Avodart 0.5 Mg Caps (Dutasteride) 2)  Doxazosin Mesylate 8 Mg Tabs (Doxazosin mesylate) 3)  Felodipine 10 Mg Tb24 (Felodipine) 4)  Simvastatin 40 Mg Tabs (Simvastatin) 5)  Lisinopril 10 Mg Tabs (Lisinopril) .Marland Kitchen.. 1 by mouth daily 6)  Colchicine 0.6 Mg Tabs (Colchicine) .Marland Kitchen.. 1 by mouth bid     Prescriptions: COLCHICINE 0.6 MG  TABS (COLCHICINE) 1 by mouth bid  #60 x 2   Entered and Authorized by:   [name] MD   Signed by:   Davy Pique MD on 05/21/2007   Method used:   Print then Give to Patient   RxID:   [id number]  ]

## 2010-05-30 NOTE — Assessment & Plan Note (Signed)
Summary: chest congestion,df   Vital Signs:  Patient Profile:   76 Years Old Male Height:     68.5 inches Weight:      196.4 pounds BMI:     29.53 Temp:     97.5 degrees F oral Pulse rate:   73 / minute BP sitting:   148 / 80  (left arm) Cuff size:   regular  Pt. in pain?   yes    Location:   abdomen    Intensity:   3  Vitals Entered By: Garen Grams LPN (May 09, 2008 11:54 AM)                  PCP:  [name] MD  Chief Complaint:  chest congestion and pain in abd.  History of Present Illness: Patient formerly of Dr Darrick Penna, here to discuss a few items.    #1) Was in [place] for Christmas, began with productive cough and temp 103F; was treated with 5-day course of azithromycin and had resolution of fevers and malaise.  Continues with mild residual cough.   #2) Reports mild intermittent localized pain in LUQ abdomen; notices it more when he does exercise or yoga.  Has intermittent bouts of constipation, has had some in the past 10 days or so. No blood in stool, no nausea or emesis. No radiation of pain.   #3) Left hand 4th digit PIP joint has been swollen for many years, attributed to gout.  He takes colchicine 0.6mg  two times a day and notices that the joint hurts him if he lets up on the colchicine for even one day. Not painful now.  He is Right hand dominant  #4) Has BPH; was seen by urologist since his last full physical with Dr Darrick Penna, has had a PSA done that was less than 2 since the last recorded one here in June.  He is taking Avodart regularly. Had been off the Avodart for about 6 months when he had PSA done here.   Would like to have HIV test done.  Does not have concern for particular event or possible episode of exposure.     Current Allergies: No known allergies    Social History:    professor at [employer];     avid golfer;     no ETOH or tobacco    commutes to work from [employer] smoking in the 1970s.      Physical  Exam  General:     Well-developed,well-nourished,in no acute distress; alert,appropriate and cooperative throughout examination Head:     Normocephalic and atraumatic without obvious abnormalities. No apparent alopecia or balding. Neck:     No deformities, masses, or tenderness noted. Chest Wall:     No deformities, masses, tenderness or gynecomastia noted. Lungs:     Normal respiratory effort, chest expands symmetrically. Lungs are clear to auscultation, no crackles or wheezes. Heart:     Normal rate and regular rhythm. S1 and S2 normal without gallop, murmur, click, rub or other extra sounds. Abdomen:     Bowel sounds positive,abdomen soft and non-tender without masses, organomegaly or hernias noted. Extremities:     No  edema, or deformity noted with normal full range of motion of all joints.   Neurologic:     gait normal.      Impression & Recommendations:  Problem # 1:  ABDOMINAL PAIN, LEFT UPPER QUADRANT (ICD-789.02) Patient with reported intermittent pain in LUQ; none now.  Notices is present when he tries to sit up from supine position,. suspect muscular strain, doubt visceral cause.  No further workup at this time.  Orders: [facility]- Est  Level 4 ([address])   Problem # 2:  HYPERTENSION, BENIGN SYSTEMIC (ICD-401.1) Has hypertension, has BP checked at [facility].  Readings that he provides are between 110-118 over 72-79.  He is interested in buying a home cuff. I have suggested that he bring it in when he comes back for next visit. He did not take his meds today, therefore I suspect his BP is better controlled when taking meds.  His updated medication list for this problem includes:    Doxazosin Mesylate 8 Mg Tabs (Doxazosin mesylate)    Felodipine 10 Mg Tb24 (Felodipine)    Lisinopril 10 Mg Tabs (Lisinopril) .Marland Kitchen... 1 by mouth daily    Lisinopril-hydrochlorothiazide 10-12.5 Mg Tabs (Lisinopril-hydrochlorothiazide) .Marland Kitchen... Take one tablet daily   Problem # 3:  GOUT,  UNSPECIFIED (ICD-274.9) History of gout, with good response to colchicine.  Given the ease of recurrence, will check serum Uric Acid level today. Consider other prophylactic meds if high.   His updated medication list for this problem includes:    Colchicine 0.6 Mg Tabs (Colchicine) .Marland Kitchen... 1 by mouth bid  Orders: Uric Acid-[facility] (60454-09811) Basic Met-[facility] (91478-29562) [facility]- Est  Level 4 ([address])   Problem # 4:  HIV EXPOSURE, POSSIBLE (ICD-V15.89) HIV to be checked at patient's request.  He asks that I call hm with results at [phone] Orders: HIV-[facility] (28413-24401) [facility]- Est  Level 4 (02725)   Complete Medication List: 1)  Avodart 0.5 Mg Caps (Dutasteride) 2)  Doxazosin Mesylate 8 Mg Tabs (Doxazosin mesylate) 3)  Felodipine 10 Mg Tb24 (Felodipine) 4)  Simvastatin 40 Mg Tabs (Simvastatin) 5)  Lisinopril 10 Mg Tabs (Lisinopril) .Marland Kitchen.. 1 by mouth daily 6)  Colchicine 0.6 Mg Tabs (Colchicine) .Marland Kitchen.. 1 by mouth bid 7)  Lisinopril-hydrochlorothiazide 10-12.5 Mg Tabs (Lisinopril-hydrochlorothiazide) .... Take one tablet daily 8)  Diazepam 5 Mg Tabs (Diazepam) .Marland Kitchen.. 1 by mouth at bedtime as needed 9)  Tramadol Hcl 50 Mg Tabs (Tramadol hcl) .Marland Kitchen.. 1 by mouth q 6 hrs as needed  Other Orders: Influenza Vaccine NON MCR (36644)     Flex Sig Next Due:  Not Indicated Hemoccult Next Due:  Not Indicated    Influenza Vaccine    Vaccine Type: Fluvax Non-MCR    Site: left deltoid    Mfr: Sanofi Pasteur    Dose: 0.5 ml    Route: IM    Given by: [name] MARTIN RN    Exp. Date: 10/25/2008    Lot #: [id number]    VIS given: 11/19/06 version given May 09, 2008.  Flu Vaccine Consent Questions    Do you have a history of severe allergic reactions to this vaccine? no    Any prior history of allergic reactions to egg and/or gelatin? no    Do you have a sensitivity to the preservative Thimersol? no    Do you have a past history of Guillan-Barre Syndrome? no    Do you currently have an acute febrile  illness? no    Have you ever had a severe reaction to latex? no    Vaccine information given and explained to patient? yes

## 2010-05-30 NOTE — Progress Notes (Signed)
Summary: [place] request  Phone Note Call from Patient Call back at Home Phone [phone]   Summary of Call: Pt is requesting to speak with someone about his knee swelling.  States he has tried ice and that hasn't helped.  He wants to know what other things he can try. Initial call taken by: Haydee Salter,  February 26, 2007 2:24 PM  Follow-up for Phone Call        using tylenol & pain pills. not very effective per pt. wants a referral to an orthopedist. did not want another appt here. told him I would send this request to his PCP. meantime he can stay off feet, use compression, otc/pain meds. suggested cane to prevent falling when knee gets weak walking Follow-up by: Golden Circle RN,  February 26, 2007 2:29 PM  Additional Follow-up for Phone Call Additional follow up Details #1::        OK to refer for evaluation to orthopedist to see if meniscal surgery is advisable.  we had already referred him to orthopedist for his hand/wrist and could be evaluated there. Additional Follow-up by: [name] MD,  February 26, 2007 5:01 PM         Appended Document: [place] request called pt. no answer. will try again to call pt

## 2010-05-30 NOTE — Progress Notes (Signed)
  Phone Note Call from Patient   Caller: Patient Call For: [name] MD Summary of Call: PT STATES HE IS NOT EXPERIENCING ANYMORE PAIN IN HIS KNEE.  WOULD LIKE TO TALK WITH DR Nala Kachel AS THEY PREVIOUSLY DISCUSSED KNEE SURGERY.  PT CAN BE  CONTACTED AT [phone] Initial call taken by: Eather Colas PATE-GADDY,CMA [facility])  Follow-up for Phone Call        I called and discussed. Follow-up by: [name] MD,  April 30, 2007 9:21 AM

## 2010-05-30 NOTE — Progress Notes (Signed)
Summary: EMERGENCY LINE CALL   Patient's urologist called today. Patient seen in office for TURP f/u. Has acute gout flare. Has been using colchicine. Able to ambulate. Uro states that he will write Rx but wants rec. Rec pred burst if looks like flare of gout not responding to current tx, follow-up on Tuesday with PCP. Patient to call at 8:30 am for work-in appointment. Helane Rima DO  April 19, 2010 10:25 AM

## 2010-05-30 NOTE — Assessment & Plan Note (Signed)
  MRI LS SPINE [facility] [address] [place], September 27, 2009 8PM

## 2010-05-30 NOTE — Assessment & Plan Note (Signed)
Summary: F/U VISIT [facility]   Vital Signs:  Patient Profile:   76 Years Old Male Weight:      196 pounds Pulse rate:   75 / minute BP sitting:   143 / 80  Vitals Entered By: Lillia Pauls CMA (Sep 11, 2006 11:33 AM)               Chief Complaint:  fu B knee pain.  History of Present Illness: Here to have orthotics made.  Pts main activity is golfing.  Main injuries have been knee and back pain and sciatics.  Back pain signficantly improved with use of temporary orthotics and wants to try custom orthotics.  No significant foot pain or injuries to report at this time.  Back pain and knee pain are stable at this time.  His sciatica continues to flare up and has been most bothersome recently.  He has a golf tourney in [place] coming up.         Foot/Ankle Exam  General:    Well-developed, well-nourished, in no acute distress; alert and oriented x 3.    Gait:    Normal heel-toe gait pattern bilaterally.    Skin:    Intact with no erythema; no scarring.    Inspection:    increased space between 1st-2nd toes on Right.  No other signficant callous or deformity.  Has significant bilateral pronation with longitudinal arch collapse.  Mild transverse arch collapse bilaterally.  Vascular:    dorsalis pedis and posterior tibial pulses 2+ and symmetric, capillary refill < 2 seconds, normal hair pattern, no evidence of ischemia.      Impression & Recommendations:  Problem # 1:  BACK PAIN, LOW (ICD-724.2) Assessment: Comment Only Patient was placed in orthotics stand in subtalar neutral position and mild knee varus to relieve pronation.  Arch support built in during molding process.  Molded to size 11 UCOlyte orthotics and attached a rigid white F3 base.  Patient tried these in golf shoes and was satisfied with feel and stability during simulated golf swings. Orders: [facility]- Est  Level 4 (02542) [facility]- [employer] [phone])  recheck after 2 months; monotor effect on LBP.   Problem #  2:  KNEE PAIN (ICD-719.46) Assessment: Unchanged Continue exercises and ketoprofen gel.  F/u as needed for changes in orthotics or for other persistent issues. Orders: [facility]- Est  Level 4 (76283) [facility]- [employer] [phone])

## 2010-05-30 NOTE — Progress Notes (Signed)
Summary: WI request  Phone Note Call from Patient   Reason for Call: Talk to Nurse Summary of Call: pt is needing to be worked in for a swollen finger Initial call taken by: Haydee Salter,  July 22, 2006 10:08 AM  Follow-up for Phone Call        reports swollen ,painful finger , notes pus around nail. developed 2 days ago. out of town today. appointment scheduled tomorrow Follow-up by: Theresia Lo RN,  July 22, 2006 11:23 AM

## 2010-05-30 NOTE — Progress Notes (Signed)
Summary: refill  Phone Note Refill Request Call back at Home Phone [phone] Message from:  Patient  Refills Requested: Medication #1:  DOXAZOSIN MESYLATE 8 MG TABS [facility] [place] [place] [phone] fax - [phone]  Initial call taken by: De Nurse,  October 27, 2008 10:25 AM    Please refill this script, to take one tab by mouth daily, #30, with 1 refill if he is now residing in [place].  He will need to find a local physician for future scripts if he is no longer our patient.  Paula Compton MD  October 27, 2008 11:56 AM   Prescriptions: DOXAZOSIN MESYLATE 8 MG TABS (DOXAZOSIN MESYLATE)   #34 Tablet x 2   Entered by:   Arlyss Repress CMA,   Authorized by:   Paula Compton MD   Signed by:   Arlyss Repress CMA, on 10/27/2008   Method used:   Printed then faxed to ...       [facility] [phone][facility] (retail)       [address] [place], [place]  [address]       Ph: [phone]       Fax: [phone]   RxID:   [id number]   Appended Document: refill faxed.

## 2010-05-30 NOTE — Progress Notes (Signed)
Summary: requesting hand specialist  Phone Note Call from Patient Call back at Home Phone [phone]   Reason for Call: Talk to Nurse Summary of Call: pt sts he spoke with MD over the weekend re: his hand. He sts Dr. Darrick Penna told him to call to find out about getting scheduled to see a hand specialist Initial call taken by: ERIN LEVAN,  February 01, 2007 3:06 PM  Follow-up for Phone Call        Pt is checking status. Follow-up by: Haydee Salter,  February 02, 2007 12:17 PM  Additional Follow-up for Phone Call Additional follow up Details #1::        done Additional Follow-up by: [facility] CMA,  February 03, 2007 3:08 PM

## 2010-05-30 NOTE — Letter (Signed)
Summary: External Correspondence  External Correspondence   Imported By: Abundio Miu 09/03/2007 06:54:59  _____________________________________________________________________  External Attachment:    Type:   Image     Comment:   External Document

## 2010-05-30 NOTE — Progress Notes (Signed)
  Phone Note Outgoing Call   Call placed by: Paula Compton MD,  September 28, 2009 12:46 PM Call placed to: Patient Summary of Call: I called patient to verify that he received results of LS Spine MRI. Dr. Darrick Penna has spoken with him and he is getting set up to see neurosurgery.  I will complete the temporary handicapped placard for 6 months and reasess later.  Initial call taken by: Paula Compton MD,  September 28, 2009 12:48 PM    New/Updated Medications: GABAPENTIN 300 MG CAPS (GABAPENTIN) 2 by mouth tid Prescriptions: GABAPENTIN 300 MG CAPS (GABAPENTIN) 2 by mouth tid  #180 x 1   Entered and Authorized by:   Paula Compton MD   Signed by:   Paula Compton MD on 09/28/2009   Method used:   [facility]        [facility]  [address] [phone][facility] (retail)       [address]       [place], [place]  [address]       Ph: [phone] or [phone]       Fax: [phone]   RxID:   [id number]

## 2010-05-30 NOTE — Assessment & Plan Note (Signed)
Summary: swollen painful finger /ls   Vital Signs:  Patient Profile:   76 Years Old Male Weight:      196.8 pounds (89.45 kg) Temp:     97 degrees F (36.11 degrees C) Pulse rate:   74 / minute BP sitting:   140 / 76  (left arm)  Pt. in pain?   yes    Location:   R middle finger    Intensity:   6  Vitals Entered By: Tomasa Rand (July 23, 2006 9:43 AM)                Procedure Note  Incision & Drainage: The patient complains of pain, redness, inflammation, tenderness, and swelling but denies fever. Date of onset: 07/16/2006 Indication: inflamed lesion  Procedure # 1: I & D    Size (in cm): 0.3 x 0.3    Region: dorsal    Location: right hand    Comment: see hpi.  anesthesia with ethyl chloride.  <1cm incision w/#15 blade after betadine prep.  significant pus expressed.  cultured.  explored with qtip/peroxide.  closed with plain dressing/neosporin.  no complication.  tolerated well.    Instrument used: #15 blade  Cleaned and prepped with: betadine Wound dressing: band-aid Instructions: daily dressing changes and RTC in 24 hrs Additional Instructions: warm salt water soaks.   History of Present Illness: finger pain.  R 3rd digit.  recently cut fingernail.  no other trauma.  gradual increase in swelling, pain, erythema.  no systemic symptoms.  not diabetic. has pain meds-see below. ROM intact at DIP/PIP.   h/o knee pain.  taking hydrocodone.  asking for PMD's opinion [id number].    Past Medical History:    Reviewed history from 06/25/2006 and no changes required:       cardiac cath 1978 - normal, creatinine 1.6 in 2006, etoh - quit 6 yrs ago, hemorrhoids 2000, smoked for 10 yrs quit 1974, tinea cruris   Family History:    Reviewed history from 06/25/2006 and no changes required:       3 male sibs died - 2 with cancer, 3 male sibs died with CVD and DM, 6 male sibs 1 panc ca/ 1 bone ca/ 1 with MI, 29 male sibs/ 3 with mis/ 1 aodm, father died kidney failure in  61s, mother died with aodm comps in 88s   Risk Factors:  Tobacco use:  never   Review of Systems  The patient denies fever.         o/w noncontributory.   Physical Exam  General:     R 3rd finger.  ROM wnl.  erythema from dorsal midphalanx distally with likely pus collection at proximal aspect of nail, esp on radial side.  tender to palpation, senstation intact.  rapid cap refill. remainder of hand w/o acute abnormaility.    Impression & Recommendations:  Problem # 1:  CELLULITIS/ABSCESS, DIGIT NOS (ICD-681.9) Assessment: New Cx pending.  Cover for MRSA w/septra.  see dr. first.  RTO in 24h.  see instructions.  may use prev rx for hydrocodone as needed for pain. Orders: [facility]- Est Level  3 (47829) Miscellaneous Lab Charge-[facility] (56213)   Problem # 2:  KNEE PAIN (YQM-578.46) will d/w with Dr. Darrick Penna at follow up tomorrow.     Patient Instructions: 1)  Please schedule a follow-up appointment tomorrow [facility] with Dr. Darrick Penna.  OK TO OVERBOOK PER DR. Para March.  Soak your finger in warm salt water several times a day and  take Septra DS 2 tabs twice a day.  Keep the area clean and covered with a bandage and neosporin during the rest of the day. 2)  I'll send your prescription to the pharmacy.

## 2010-05-30 NOTE — Progress Notes (Signed)
Summary: DR. Neysha Criado PLEASE ADVISE.............TS  Phone Note Call from Patient Call back at [facility] Phone [phone]   Summary of Call: Pt states he is returning call from someone.  He states he doesn't know who or why called. Initial call taken by: Haydee Salter,  March 01, 2007 11:24 AM  Follow-up for Phone Call        called pt. re: knee pain. pt reports, that he took aleve on saturday and on sunday, also took his inserts out of his shoes and feels much better.  pt said, that if he is being referred to an orthopedist than he wants better than good and dr.Jaysa Kise should advise which doctor to see. dr.gramig saw pt for his wrist.  dr.Keisi Eckford please advise and i will sched. appt for pt. thanks Follow-up by: Arlyss Repress CMA,,  March 01, 2007 11:46 AM

## 2010-05-30 NOTE — Letter (Signed)
Summary: Generic Letter  [facility]  [address]   [place], [place] [address]   Phone: [phone]  Fax: [phone]    03/29/2010  Donnelle Capito [address] [place], [place]  [address]  Dear Mr. Larock,   It was a plaasure to see you last week. I write with your lab results.  The kidney function test is much improved from previous studies.  Also, your cholesterol panel is in check as well.  I enclose a copy of the November 29th lab report for your records.       Sincerely,   Paula Compton MD  Appended Document: Generic Letter mailed

## 2010-05-30 NOTE — Assessment & Plan Note (Signed)
Summary: cpe,tcb   Vital Signs:  Patient profile:   76 year old male Height:      68.5 inches Weight:      192 pounds BMI:     28.87 Pulse rate:   64 / minute BP sitting:   138 / 76  (right arm)  Vitals Entered By: Arlyss Repress CMA, (Sep 04, 2009 8:37 AM) CC: physical. f/up L-spine x-ray. pain worse in am. up to 10/10 Is Patient Diabetic? No Pain Assessment Patient in pain? no        Primary Care Provider:  Paula Compton MD  CC:  physical. f/up L-spine x-ray. pain worse in am. up to 10/10.  History of Present Illness: Paul [name] comes in today for follow up on his LBP, for which me saw Dr Darrick Penna most recently on May 5th.  He was started on gabapentin then, and is increasing his dose per the schedule, at 600mg /day thus far.  He continues to have bad (10/10) pain in the R buttock that radiates down the R posterior thigh and calf, lateral leg.  Gets much better after he walks 1/2 mile, and is not present at all with sitting.  Not accompanied by weakness or paresthesia.  He ascribes this pain to recent increase in golfing activity.   Better after rides recumbent bicycle for 20 minutes.  The worst time is when he first awakens in the morning, gets out o bed and stands in front of the mirror brushing his teeth.   Regarding other issues, his GERD sxs are better controlled on omeprazole 40mg  in am and 20mg  in the evening with Zegerid.  Saw Dr Matthias Hughs last month, and is scheduled for EGD on June 7th.  Is up to date on colonoscopy as well.   He reports decrease in his nocturia symptoms since starting gabapentin.  No dysuria.  Habits & Providers  Alcohol-Tobacco-Diet     Tobacco Status: quit     Year Quit: 1975  Current Medications (verified): 1)  Avodart 0.5 Mg Caps (Dutasteride) 2)  Doxazosin Mesylate 8 Mg Tabs (Doxazosin Mesylate) 3)  Simvastatin 40 Mg Tabs (Simvastatin) .... Sig: Take 1 Tab By Mouth Once Daily 4)  Felodipine 10 Mg Xr24h-Tab (Felodipine) .... Sig: Take 1 Tablet By  Mouth Daily 5)  Polyethylene Glycol 3350  Powd (Polyethylene Glycol 3350) .... Sig: 17g (Heaping Teaspoon) Dissolved in 8 Oz Water, Drink Once Daily Disp: 1 Cannister 6)  Levitra 20 Mg Tabs (Vardenafil Hcl) .... Take 1/2 To 1 Tablet By Mouth At Bedtime As Needed 7)  Omeprazole 40 Mg Cpdr (Omeprazole) .... Take One By Mouth Daily 8)  Lisinopril 10 Mg Tabs (Lisinopril) .... Once Daily 9)  Multivitamins  Caps (Multiple Vitamin) 10)  Bayer Childrens Aspirin 81 Mg Chew (Aspirin) 11)  Nexium 40 Mg Cpdr (Esomeprazole Magnesium) .... Sig: Take 1 By Mouth One Time Daily 12)  Gabapentin 300 Mg Caps (Gabapentin) .Marland Kitchen.. 1 By Mouth Tid 13)  Percocet 5-325 Mg Tabs (Oxycodone-Acetaminophen) .... Sig: Take 1/2 To 1 Tablet By Mouth Every 8 Hours As Needed For Extreme Pain  Allergies (verified): No Known Drug Allergies  Physical Exam  General:  Generally well appaering, no apparent distress.  Walking without assistance, gets to table without assist. Eyes:  Clear sclerae bilaterally.  Mouth:  Moist mucus membranes; clear oropharynx. Neck:  Neck supple. No anterior cervical or supraclavicular nodes Lungs:  Clear lung fields bilaterally Heart:  Regular S1S2.   Abdomen:  Soft, nontender, nondistended. No CVA tenderness Msk:  No LE edema noted; no pain to palpate over R buttocks.   Neurologic:  Full strength with flex/extension of great toes bilat.  No clonus.  DTRs patellar and achilles 1+ and symmetric.gait normal.     Impression & Recommendations:  Problem # 1:  BACK PAIN, LUMBAR, WITH RADICULOPATHY (ICD-724.4)  Has just started on Gabapentin; not at the three-times-a-day dosing yet.  If he gets even limited benefit, may consider going higher on this.  Short term pain relief with limited-use narcotic discussed; we have discussed the sedative properties of the meds, and the fact that sedation (rather than pain) may limit some of his function if he is highly sensitive to the sedative properties.  He agrees to  trial of this as he is titrating up on the gabapentin, first dose at home when he does not have to drive.  Not to take with alcohol.  No prior adverse effects with narcotics after his L total knee replacement.  His updated medication list for this problem includes:    Bayer Childrens Aspirin 81 Mg Chew (Aspirin)    Percocet 5-325 Mg Tabs (Oxycodone-acetaminophen) ..... Sig: take 1/2 to 1 tablet by mouth every 8 hours as needed for extreme pain  Orders: [facility]- Est  Level 4 (16109)  Problem # 2:  HYPERLIPIDEMIA (ICD-272.4) Due for check of direct LDL today.  His updated medication list for this problem includes:    Simvastatin 40 Mg Tabs (Simvastatin) ..... Sig: take 1 tab by mouth once daily  Orders: Direct LDL-[facility] ([id number]) [facility]- Est  Level 4 (91478)  Problem # 3:  HYPERTENSION, BENIGN SYSTEMIC (ICD-401.1) To contiunue with same regimen. His updated medication list for this problem includes:    Doxazosin Mesylate 8 Mg Tabs (Doxazosin mesylate)    Felodipine 10 Mg Xr24h-tab (Felodipine) ..... Sig: take 1 tablet by mouth daily    Lisinopril 10 Mg Tabs (Lisinopril) ..... Once daily  Orders: Comp Met-[facility] [id number]) [facility]- Est  Level 4 (57846)  Complete Medication List: 1)  Avodart 0.5 Mg Caps (Dutasteride) 2)  Doxazosin Mesylate 8 Mg Tabs (Doxazosin mesylate) 3)  Simvastatin 40 Mg Tabs (Simvastatin) .... Sig: take 1 tab by mouth once daily 4)  Felodipine 10 Mg Xr24h-tab (Felodipine) .... Sig: take 1 tablet by mouth daily 5)  Polyethylene Glycol 3350 Powd (Polyethylene glycol 3350) .... Sig: 17g (heaping teaspoon) dissolved in 8 oz water, drink once daily disp: 1 cannister 6)  Levitra 20 Mg Tabs (Vardenafil hcl) .... Take 1/2 to 1 tablet by mouth at bedtime as needed 7)  Omeprazole 40 Mg Cpdr (Omeprazole) .... Take one by mouth daily 8)  Lisinopril 10 Mg Tabs (Lisinopril) .... Once daily 9)  Multivitamins Caps (Multiple vitamin) 10)  Bayer Childrens Aspirin 81 Mg Chew  (Aspirin) 11)  Nexium 40 Mg Cpdr (Esomeprazole magnesium) .... Sig: take 1 by mouth one time daily 12)  Gabapentin 300 Mg Caps (Gabapentin) .Marland Kitchen.. 1 by mouth tid 13)  Percocet 5-325 Mg Tabs (Oxycodone-acetaminophen) .... Sig: take 1/2 to 1 tablet by mouth every 8 hours as needed for extreme pain  Other Orders: Pneumococcal Vaccine (96295) Admin 1st Vaccine (28413)  Patient Instructions: 1)  It was a pleasure to see you today.   2)  I ordered your LDL cholesterol and a metabolic panel to look at your kidney function.  I will call you at your cell [phone] with the results.  3)  Regarding your back pain, I have given you a one-time prescription of Percocet 5/325; you may take  1/2 to 1 tablet as needed for extreme pain.  As we discussed, it may produce sleepiness, so please take the first one at home to gauge your body's response to the medication.  Do not take with other sedative substances such as alcohol.  4)  We are giving you the pneumonia vaccine today. 5)  I will look for the results from your endoscopy with Dr Matthias Hughs on June 7, and from your visit to Dr Isabel Caprice later this summer.  Prescriptions: PERCOCET 5-325 MG TABS (OXYCODONE-ACETAMINOPHEN) SIG: Take 1/2 to 1 tablet by mouth every 8 hours as needed for extreme pain  #60 x 0   Entered and Authorized by:   Paula Compton MD   Signed by:   Paula Compton MD on 09/04/2009   Method used:   Print then Give to Patient   RxID:   [id number]    Prevention & Chronic Care Immunizations   Influenza vaccine: Fluvax Non-MCR  (05/09/2008)   Influenza vaccine due: 05/09/2009    Tetanus booster: 10/07/2007: given   Tetanus booster due: 10/06/2017    Pneumococcal vaccine: Pneumovax  (09/04/2009)    H. zoster vaccine: Not documented  Colorectal Screening   Hemoccult: Not documented   Hemoccult due: Not Indicated    Colonoscopy: Done.  (01/27/2004)   Colonoscopy due: 01/26/2014  Other Screening   PSA: 3.68  (10/05/2007)   PSA due due:  10/04/2008   Smoking status: quit  (09/04/2009)  Lipids   Total Cholesterol: 173  (10/05/2007)   LDL: 106  (10/05/2007)   LDL Direct: Not documented   HDL: 54  (10/05/2007)   Triglycerides: 64  (10/05/2007)    SGOT (AST): 18  (05/09/2009)   SGPT (ALT): 16  (05/09/2009) CMP ordered    Alkaline phosphatase: 46  (05/09/2009)   Total bilirubin: 0.5  (05/09/2009)    Lipid flowsheet reviewed?: Yes   Progress toward LDL goal: At goal  Hypertension   Last Blood Pressure: 138 / 76  (09/04/2009)   Serum creatinine: 1.50  (05/09/2009)   Serum potassium 3.3  (05/09/2009) CMP ordered     Hypertension flowsheet reviewed?: Yes   Progress toward BP goal: At goal  Self-Management Support :   Personal Goals (by the next clinic visit) :      Personal blood pressure goal: 140/90  (09/04/2009)     Personal LDL goal: 130  (09/04/2009)    Hypertension self-management support: Not documented    Lipid self-management support: Not documented     Immunizations Administered:  Pneumonia Vaccine:    Vaccine Type: Pneumovax    Site: left deltoid    Mfr: Merck    Dose: 0.5 ml    Route: IM    Given by: Tessie Fass CMA    Exp. Date: 04/06/2010    Lot #: [id number]    VIS given: 11/24/95 version given Sep 04, 2009.

## 2010-05-30 NOTE — Letter (Signed)
Summary: Preauthorization Request  Preauthorization Request   Imported By: Marily Memos 09/27/2009 11:57:50  _____________________________________________________________________  External Attachment:    Type:   Image     Comment:   External Document

## 2010-05-30 NOTE — Assessment & Plan Note (Signed)
Summary: TO SEE Espyn Radwan/PAIN IN CALF TO THIGH   Primary Provider:  Paula Compton MD   History of Present Illness: Paul [name] as an avid golfer has added a weighted swing exercise to his regimen holds 6 to 10 lb weight and swings w golf motion Now past 5 days has had radicular pain and some tingling down RT leg Has a hx of LBP and known DDD of lumbar spine  no weakness in leg no red flags has not tried any meds  comes for eval  Allergies: No Known Drug Allergies  Physical Exam  General:  Well-developed,well-nourished,in no acute distress; alert,appropriate and cooperative throughout examination Msk:  SLR         seated and lying is OK XSLR  neg Flexibility  good for age Palpable tenderness  some over RT SIJ Pearlean Brownie OK Strength at foot:   plantarflexion             dorsiflexion eversion                 inversion  all are wnl strength        quad               hamstring        hip flexor        hip abductors  all are good  walking   heel        toe         tandem  all good sensory change  none x some tingling at RT great toe  reflex change with knee jerk on RT diminished    Impression & Recommendations:  Problem # 1:  BACK PAIN, LOW (ICD-724.2)  His updated medication list for this problem includes:    Bayer Childrens Aspirin 81 Mg Chew (Aspirin)  reneforced returning to knee to chest/ knee to opp shoulder add series of exerc and stretch for hip and SIJ  Problem # 2:  SCIATICA, RIGHT (ICD-724.3)  His updated medication list for this problem includes:    Bayer Childrens Aspirin 81 Mg Chew (Aspirin)  This is more radicular to L4/5 and not full blown sciatica  if not resolving w exercises he is to call in 1 wk At that point would put him on gabapentin just at HS unless more severe  Complete Medication List: 1)  Avodart 0.5 Mg Caps (Dutasteride) 2)  Doxazosin Mesylate 8 Mg Tabs (Doxazosin mesylate) 3)  Simvastatin 40 Mg Tabs (Simvastatin) .... Sig: take 1 tab by mouth  once daily 4)  Felodipine 10 Mg Xr24h-tab (Felodipine) .... Sig: take 1 tablet by mouth daily 5)  Polyethylene Glycol 3350 Powd (Polyethylene glycol 3350) .... Sig: 17g (heaping teaspoon) dissolved in 8 oz water, drink once daily disp: 1 cannister 6)  Levitra 20 Mg Tabs (Vardenafil hcl) .... Take 1/2 to 1 tablet by mouth at bedtime as needed 7)  Omeprazole 40 Mg Cpdr (Omeprazole) .... Take one by mouth daily 8)  Lisinopril 10 Mg Tabs (Lisinopril) .... Once daily 9)  Multivitamins Caps (Multiple vitamin) 10)  Bayer Childrens Aspirin 81 Mg Chew (Aspirin) 11)  Nexium 40 Mg Cpdr (Esomeprazole magnesium) .... Sig: take 1 by mouth one time daily  Appended Document: TO SEE Thula Stewart/PAIN IN CALF TO THIGH Seen by Dr. Darrick Penna

## 2010-05-30 NOTE — Miscellaneous (Signed)
Summary: Orders Update  Clinical Lists Changes  Problems: Added new problem of SPECIAL SCREENING MALIGNANT NEOPLASM OF PROSTATE (ICD-V76.44) Orders: Added new Test order of Comp Met-[facility] [phone]) - Signed Added new Test order of Lipid-[facility] ([id number]) - Signed Added new Test order of PSA (Medicare)-[facility] ([id number]) - Signed

## 2010-05-30 NOTE — Progress Notes (Signed)
Summary: requesting rx  Phone Note Call from Patient Call back at Home Phone [phone]   Reason for Call: Talk to Doctor Summary of Call: requesting rx for his gout, [facility] Initial call taken by: Knox Royalty,  Sep 03, 2007 8:59 AM      Prescriptions: COLCHICINE 0.6 MG  TABS (COLCHICINE) 1 by mouth bid  #60 x 6   Entered by:   Arlyss Repress CMA,   Authorized by:   [name] MD   Signed by:   Arlyss Repress CMA, on 09/03/2007   Method used:   [facility]ron[facility] to ...       [facility] [address] [phone]*       [address]       [place], [place]  [address]       Ph: [phone] or [phone]       Fax: [phone]   RxID:   [id number]

## 2010-05-30 NOTE — Assessment & Plan Note (Signed)
Summary: BP CHECK/KH  Nurse Visit Patient in for BP check today. BP checked manually with regular adult cuff. BP LA 138/74 RA 130/76 pulse 80.  patient will schedule appointment for CPE. Theresia Lo RN  February 22, 2010 3:45 PM   Allergies: No Known Drug Allergies  Orders Added: 1)  No Charge Patient Arrived (NCPA0) [NCPA0]

## 2010-05-30 NOTE — Miscellaneous (Signed)
Summary: prior auth  Clinical Lists Changes prior auth for omeprazole to pcp.[place]en Circle RN  May 31, 2009 11:52 AM  Medications: Added new medication of NEXIUM 40 MG CPDR (ESOMEPRAZOLE MAGNESIUM) SIG: Take 1 by mouth one time daily - Signed Rx of NEXIUM 40 MG CPDR (ESOMEPRAZOLE MAGNESIUM) SIG: Take 1 by mouth one time daily;  #30 x 6;  Signed;  Entered by: Paula Compton MD;  Authorized by: Paula Compton MD;  Method used: [facility] [phone]*, [address] [place], [place], [place]  [address], Ph: [phone], Fax: [phone]    Prescriptions: NEXIUM 40 MG CPDR (ESOMEPRAZOLE MAGNESIUM) SIG: Take 1 by mouth one time daily  #30 x 6   Entered and Authorized by:   Paula Compton MD   Signed by:   Paula Compton MD on 06/05/2009   Method used:   Electronically to        [facility] [phone]* (retail)       [address]       [place], [place]  [address]       Ph: [phone]       Fax: [phone]   RxID:   [id number]  Please call the patient to find out if he has been seen by Dr. Matthias Hughs (GI), and if he is taking omeprazole 40mg  daily. I did not find the prior auth in my box.  If he is taking it, we may need to request again or change to a different PPI medication. Paula Compton MD  June 05, 2009 8:52 AM called pt. re: above. pt reports, that he has seen Dr.Buccini for consultation. He added some OTC meds. Pt is doing fine on Omeprazole. He will have records from [facility] faxed to [place]...fwd. to Dr.Fabion Gatson for review .Arlyss Repress CMA,  June 05, 2009 12:03 PM  I spoke with patient, who reports he has been doing well with omeprazole 40mg  once daily. Has not tried other PPI.  He plans to see Dr Matthias Hughs again in 2 months.  Has gradually been getting better. Will plan to complete his current supply of omeprazole 40mg , has enough for about 2 weeks.  Will transist over to esomeprazole 40mg  daily afterward.  Will call our  office if recurrent symptoms, at which point we can attempt a prior authorization as needed. Paula Compton MD  June 05, 2009 12:29 PM  Appended Document: prior auth prior auth for nexium thru [facility] done & to pcp   Appended Document: prior auth Prior auth signed and put in the "to be faxed" box.

## 2010-05-30 NOTE — Progress Notes (Signed)
----   Converted from flag ---- ---- 10/06/2007 9:20 AM, [name] MD wrote: please call. he has appt tomorrow. PSA is 3.68 which is up a lot from our last one that was [place] of 2008.  check and ask him if he has had this measured at urologist so he can bring the result from that.  all other labs look good.  tks ------------------------------  patient notified of lab results. states he has had PSA since 01/08 and it was up to 5 he thinks. he will try to obtain copy of report to bring to appointment tomorrow. Kimberlynn Lumbra [facility] RN  October 06, 2007 11:50 AM

## 2010-05-30 NOTE — Progress Notes (Signed)
Summary: Appt on 06-04-09 cancelled/Pt of Dr Matthias Hughs  Phone Note Outgoing Call   Call placed by: Nance @ Dr Marinell Blight office Call placed to: Dr Arlyce Dice Summary of Call: Appointment of Feb 7th was cancelled because she was unaware that Mr Shadd was already established with Dr Matthias Hughs. Initial call taken by: Leanor Kail [facility],  May 16, 2009 10:33 AM

## 2010-05-30 NOTE — Progress Notes (Signed)
Summary: Pt would like return phone call  Phone Note Outgoing Call   Call placed by: [name] MARTIN RN,  July 01, 2006 11:34 AM Call placed to: Patient Reason for Call: Discuss lab or test results Summary of Call: Called pt to discuss uric acid results - gave pt Dr. Darrick Penna' message that uric acid was slightly high, but he does not want to chage pt's current treatment.  Pt states that he would like to talk with Dr. Darrick Penna about his knee pain, states it is intermittent, and he is not sure if he should continue treating with pain meds or if another treatment method should be discussed.  Offered to make pt appointment with Dr. Darrick Penna.  Pt states he would like a phone call from Dr. Darrick Penna because he's been seen twice in the office for this problem.

## 2010-05-30 NOTE — Consult Note (Signed)
[facility]: [facility]   Imported By: Bradly Bienenstock 10/06/2006 11:58:46  _____________________________________________________________________  External Attachment:    Type:   Image     Comment:   External Document

## 2010-05-30 NOTE — Assessment & Plan Note (Signed)
Summary: SHOULDEER PAIN/JW   Vital Signs:  Patient Profile:   76 Years Old Male Height:     68.5 inches Pulse rate:   56 / minute BP sitting:   158 / 93  Vitals Entered By: Lillia Pauls CMA (June 23, 2008 11:44 AM)                 PCP:  [name] MD  Chief Complaint:  L SHOULDER PAIN X 1 DAY.  History of Present Illness: L shoulder pain x 1 day Started after carrying heavy bags in L arm. Did not think much of it at the time, but then last night developed pain.  No weakness or loss of motion.  Hx of RTC tendonitis treated conservatively (no hx of injections or surgeries). Denies neck pain, numbness, tingling, weakness. Pain is mostly lateral shoulder, with some pain in L trapezius and L superior scapula region.    Current Allergies: No known allergies       Physical Exam  General:     Well-developed,well-nourished,in no acute distress; alert,appropriate and cooperative throughout examination Msk:     No gross skeletal abnormalities, muscle atrophy or tenderness negative spurlings test, neck non-tender  shoulder with  Full ROM, 5/5 rotator cuff strength, no pain with rotator cuff movements Negative drop arm test, negative empty can testing Negative hawkins and neer test No apprehension, no sulcus sign Negative clunk and o'briens test Normal scapular motion, but mild tenderness and spasm in latissimus just superior to scapula 2+ radial pulse 2+ biceps, triceps, brachioradialis DTR's  Skin:     no lesions or rashes    Impression & Recommendations:  Problem # 1:  SHOULDER PAIN, LEFT (ICD-719.41) Assessment: New L trapezius strain with spasm.  Will treat with tramadol as needed. Advised to use heat, gentle rows for exercise.  F/u as needed. His updated medication list for this problem includes:    Tramadol Hcl 50 Mg Tabs (Tramadol hcl) .Marland Kitchen... 1 by mouth q 6 hrs as needed   Complete Medication List: 1)  Avodart 0.5 Mg Caps (Dutasteride) 2)  Doxazosin  Mesylate 8 Mg Tabs (Doxazosin mesylate) 3)  Felodipine 10 Mg Tb24 (Felodipine) 4)  Simvastatin 40 Mg Tabs (Simvastatin) 5)  Lisinopril 10 Mg Tabs (Lisinopril) .Marland Kitchen.. 1 by mouth daily 6)  Colchicine 0.6 Mg Tabs (Colchicine) .Marland Kitchen.. 1 by mouth bid 7)  Lisinopril-hydrochlorothiazide 10-12.5 Mg Tabs (Lisinopril-hydrochlorothiazide) .... Take one tablet daily 8)  Diazepam 5 Mg Tabs (Diazepam) .Marland Kitchen.. 1 by mouth at bedtime as needed 9)  Tramadol Hcl 50 Mg Tabs (Tramadol hcl) .Marland Kitchen.. 1 by mouth q 6 hrs as needed 10)  Felodipine 10 Mg Xr24h-tab (Felodipine) .... Sig: take 1 tablet by mouth daily 11)  Polyethylene Glycol 3350 Powd (Polyethylene glycol 3350) .... Sig: 17g (heaping teaspoon) dissolved in 8 oz water, drink once daily disp: 1 cannister

## 2010-05-30 NOTE — Assessment & Plan Note (Signed)
Summary: L MEDIAL ELBOW PAIN X MON   Vital Signs:  Patient profile:   76 year old male BP sitting:   120 / 60  Vitals Entered By: Lillia Pauls CMA (November 03, 2008 10:46 AM)  Primary Provider:  [name] MD   History of Present Illness: Paul [name] comes in today for evaluation of left medial elbow pain.  It began Tuesday morning.  He states he has been lifting weights and when doing triceps exercises with 100 pounds he did feel a twinge of pain in that area but it went away.  Then Monday he was golfing and remember taking an especially hard shot out of the rough, though he had no pain at that moment.  When he woke up Tuesday, he had pain over his medial elbow as well as pain there when he would open and close his hand and rotate his arm back and forth.  It has been red and swollen.  He has been using tramadol and ice massage for treatment.  Ice helps some but the tramadol doesn't seem to do much.  He denies weakness or numbness.  No pain in the muscles of the forearm.   Allergies: No Known Drug Allergies  Physical Exam  General:  alert, well-developed, well-nourished, well-hydrated, healthy-appearing, and cooperative to examination.   Msk:  Point tenderness over ulnar groove on medial elbow.  Erythema and swelling present over same area.  Full strength and ROM of triceps, biceps, pronation, supination, and the hand.  Grip strength good.  Pain with movement, espcially flexion of the arm and pronation. Additional Exam:  Ultrasound: Triceps tendon calcification near the insertion on the elbow Fluid within the flexor digitorum tendon indication flexor tendon strain.  Also increased inflammatory vasculature and neovessels seen on doppler of the flexor digitorum tendon. Olecranon intact.  Triceps muscle and flexor digitorum muscle intact.   images saved for documentation   Impression & Recommendations:  Problem # 1:  CALCIFIC TENDINITIS (ICD-727.82)  Calcific tendinitis of left triceps  tendon seen on ultrasound.  Likely chronic/old tearing from overdoing weight on triceps exercises at the gym.  He will use voltaren gel three times a day and cut down on the weight on triceps exercises at the gym.   F/U in 1 month.   Orders: US EXTREMITY NON-VASC REAL-TIME IMG [phone])  Problem # 2:  SPRAIN&STRAIN UNSPECIFIED SITE ELBOW&FOREARM (ICD-841.9)  Fluid seen in the flexor digitorum tendon on the left indicating tendon strain.  He will use voltaren gel three times a day.  He will stretch and strengthen with by doing theraband exercises with the lateral three fingers 1-2 times daily.  Wrist curls, 3 sets of 15, 1-2 times daily with 5 pounds of weight or less and wrist rolls.  When golfing he will use a pneumatic elbow strap over the medial side.  He may continue to golf but should use pain as his guide.  F/U in 1 month.   Orders: US EXTREMITY NON-VASC REAL-TIME IMG [phone])  Complete Medication List: 1)  Avodart 0.5 Mg Caps (Dutasteride) 2)  Doxazosin Mesylate 8 Mg Tabs (Doxazosin mesylate) 3)  Felodipine 10 Mg Tb24 (Felodipine) 4)  Simvastatin 40 Mg Tabs (Simvastatin) 5)  Lisinopril 10 Mg Tabs (Lisinopril) .Marland Kitchen.. 1 by mouth daily 6)  Colchicine 0.6 Mg Tabs (Colchicine) .Marland Kitchen.. 1 by mouth bid 7)  Lisinopril-hydrochlorothiazide 10-12.5 Mg Tabs (Lisinopril-hydrochlorothiazide) .... Take one tablet daily 8)  Diazepam 5 Mg Tabs (Diazepam) .Marland Kitchen.. 1 by mouth at bedtime as needed  9)  Tramadol Hcl 50 Mg Tabs (Tramadol hcl) .Marland Kitchen.. 1 by mouth q 6 hrs as needed 10)  Felodipine 10 Mg Xr24h-tab (Felodipine) .... Sig: take 1 tablet by mouth daily 11)  Polyethylene Glycol 3350 Powd (Polyethylene glycol 3350) .... Sig: 17g (heaping teaspoon) dissolved in 8 oz water, drink once daily disp: 1 cannister 12)  Voltaren 1 % Gel (Diclofenac sodium) .... Apply 1g to affected area four times daily disp: 80 g or largest  Other Orders: Pneumatic Arm Band (Z6109)  Patient Instructions: 1)  Use the Voltaren gel on  your elbow in the areas indicated four times a day. 2)  Do light wrist curls with 5 pounds or less, 3 sets of 15, 1-2 times a day. 3)  Do wrist rolls, 3 sets of 15, 1-2 times a day, as shown in clinic.  Make sure you don't feel any clicking. 4)  Stretch your outside 3 fingers with a theraband 1-2 times a day. 5)  Cut down the weight on your triceps exercises at the gym.  6)  Use the elbow strap when playing golf.  You can continue to play golf but let pain be your guide.  If you elbow gets sore, take a break.  7)  Please follow-up in 1 month.  Prescriptions: VOLTAREN 1 % GEL (DICLOFENAC SODIUM) apply 1g to affected area four times daily disp: 80 g or largest  #1 x 4   Entered by:   Ardeen Garland  MD   Authorized by:   [name] MD   Signed by:   Ardeen Garland  MD on 11/03/2008   Method used:   Electronically to        [facility] [phone]* (retail)       [address]       [place], [place]  [address]       Ph: [phone]       Fax: [phone]   RxID:   [id number]   Appended Document: L MEDIAL ELBOW PAIN X MON ETT SCHEDULED FOR 7.20.10 AT 11:15AM. PT INFORMED

## 2010-05-30 NOTE — Assessment & Plan Note (Signed)
Summary: fu/kh   Vital Signs:  Patient Profile:   76 Years Old Male Weight:      197.2 pounds Pulse rate:   83 / minute BP sitting:   123 / 68  Pt. in pain?   yes    Location:   knees     Intensity:   2  Vitals Entered By: Arlyss Repress CMA, (Aug 28, 2006 10:44 AM)                Chief Complaint:  f/up bilateral knee pain/much better.  History of Present Illness: Prob 1: Left knee pain is much improved.  has been doing hip abductor/adductor exercises and hip flexion/extension exercises which have helped tremendously.  Reports 2/10 as opposed to 8/10 pain prviously.  Using ketoprofen gel only two times a day, but doesn't appreciate and effect.  Prob 2:   Since Dec, reports burning , neuropathic pain radiating down the posterior aspect of his right leg into his foot.  Denies weakness or numbness in the R lower extremity.  Denies bowel or bladder incontinence or saddle paresthesia.  Pain occurs daily, usually with flexion *during putting).  However, he is able to stretch it out with yoga exercises which relieves the pain 90% of the time.    Past Medical History:    Reviewed history from 06/25/2006 and no changes required:       cardiac cath 1978 - normal, creatinine 1.6 in 2006, etoh - quit 6 yrs ago, hemorrhoids 2000, smoked for 10 yrs quit 1974, tinea cruris      Physical Exam  General:     Well-developed,well-nourished,in no acute distress; alert,appropriate and cooperative throughout examination Msk:     no laxity to varus/valgus stress in the left knee.  Negative ant/post drawer sign.  Negative McMurray.  Negative patellar grind.  Positive crepitus in the right knee.  back exam- full flex/ext without pain.  no point tenderness. no swelling.  negative straight leg raise bilaterally.  muscle strength 5/5 equal and symmetric in th lower extremities.  Normal sesantion to cold, pain, and 2-point discrimination in both legs. normal reflexes. Neurologic:     No cranial  nerve deficits noted. Station and gait are normal. Plantar reflexes are down-going bilaterally. DTRs are symmetrical throughout. Sensory, motor and coordinative functions appear intact.    Impression & Recommendations:  Problem # 1:  KNEE PAIN (ICD-719.46) Assessment: Improved due to arthritis.  Improved, continue exercises and as needed ketoprofen gel.   Orders: [facility]- Est Level  3 (04540)   Problem # 2:  SCIATICA, RIGHT (ICD-724.3) Assessment: New Given he is able to stretch to reliev pain, there is no indication for a nerve blocking agent such as neurontin/lyrica at present.  If pain worsens, consider MRI to evaluate for surgical correction vs medication trial.  also wants to get a trial of orthotics to see if they will lessen back pain as temp orthtoics seem to help.  rtc for orthtoics Orders: [facility]- Est Level  3 (98119)

## 2010-05-30 NOTE — Assessment & Plan Note (Signed)
Summary: PHYSICAL/[facility]   Vital Signs:  Patient profile:   76 year old male Height:      68.5 inches Weight:      203.5 pounds BMI:     30.60 Pulse rate:   65 / minute BP sitting:   132 / 79  (right arm) Cuff size:   large  Vitals Entered By: Arlyss Repress CMA, (March 15, 2010 2:43 PM) CC: physical. blood work and UA Is Patient Diabetic? No Pain Assessment Patient in pain? yes     Location: right leg Intensity: 4 Onset of pain  Chronic   Primary Care Provider:  Paula Compton MD  CC:  physical. blood work and UA.  History of Present Illness: Seen today for first time in a long time; has several items to address.   1) COntinues with BPH symptoms.  Sees urology for this, may have TURP in Dec or Jan for this.  PSA followed by his urologist as well.   2) R sided leg pain.  Seen by Dr Channing Mutters, has had 3 inj already (the most recent one was 1 month ago).  Has had 80% improvement, still with pain along R leg between ankle and calf.  Able to walk, but some days is in pain while walking.  Scheduling another appt with Dr Channing Mutters about this.  3) Had GERD sxs, went to see GI for this and treated for GERD.  Got better with PPI, which he is no longer taking.  Associated the GERD with overeating, fatty food like ice cream, and perhaps wine (1-1/2 glasses a day pinot noir).  4) Reviewed prior labs; has CKD3 by GFR calculation (53).  Discsused possible causes, reviewed today's UA which does not show proteinuria.  Possible post-obstructive causes, versus hypertensive-induced.   On lisinopril 10mg  daily, which he takes.  Goes to pharmacy or fire station to check BP occasionally, finds it's usually in the 140s=range.   Habits & Providers  Alcohol-Tobacco-Diet     Tobacco Status: quit     Year Quit: 1974  Current Medications (verified): 1)  Avodart 0.5 Mg Caps (Dutasteride) 2)  Doxazosin Mesylate 8 Mg Tabs (Doxazosin Mesylate) .Marland Kitchen.. 1 By Mouth At Bedtime 3)  Simvastatin 40 Mg Tabs (Simvastatin) ....  Sig: Take 1 Tab By Mouth Once Daily 4)  Felodipine 10 Mg Xr24h-Tab (Felodipine) .... Sig: Take 1 Tablet By Mouth Daily 5)  Polyethylene Glycol 3350  Powd (Polyethylene Glycol 3350) .... Sig: 17g (Heaping Teaspoon) Dissolved in 8 Oz Water, Drink Once Daily Disp: 1 Cannister 6)  Levitra 20 Mg Tabs (Vardenafil Hcl) .... Take 1/2 To 1 Tablet By Mouth At Bedtime As Needed 7)  Multivitamins  Caps (Multiple Vitamin) 8)  Bayer Childrens Aspirin 81 Mg Chew (Aspirin) 9)  Tramadol Hcl 50 Mg Tabs (Tramadol Hcl) .... One Tab By Mouth Q4-6h As Needed For Pain. 10)  Lisinopril 20 Mg Tabs (Lisinopril) .Marland Kitchen.. 1 By Mouth Once Daily  Allergies (verified): No Known Drug Allergies  Social History: Reviewed history from 05/09/2008 and no changes required. professor at [employer];  avid golfer;  no ETOH or tobacco commutes to work from [employer] smoking in the 1970s.   Review of Systems       Gained weight (reviewed in VS flowsheet); denies fevers or sweats, denies chest pain or cough, denies abd pain, denies diarrhea or constipation (not using Miralax often, mostily just prunes).    Physical Exam  General:  well appearing, no apparent distress Eyes:  PEERL. Opacities in  lenses Ears:  External ear exam shows no significant lesions or deformities.  Otoscopic examination reveals clear canals, tympanic membranes are intact bilaterally without bulging, retraction, inflammation or discharge. Hearing is grossly normal bilaterally. Mouth:  Oral mucosa and oropharynx without lesions or exudates.  Teeth in good repair. Neck:  No deformities, masses, or tenderness noted. Lungs:  Normal respiratory effort, chest expands symmetrically. Lungs are clear to auscultation, no crackles or wheezes. Heart:  Normal rate and regular rhythm. S1 and S2 normal without gallop, murmur, click, rub or other extra sounds. Abdomen:  Bowel sounds positive,abdomen soft and non-tender without masses, organomegaly or hernias  noted. Pulses:  palpable dp pulses bilaterally Skin:  few comedones along nose bilat, and on back.   Two raised round mildly erythematous lesions on R cheek (one measures approx 4mm, the other 2mm diameter).     Impression & Recommendations:  Problem # 1:  CHRONIC KIDNEY DISEASE STAGE III (MODERATE) (ICD-585.3) Based on most recent labs in May, GFR 53.  Will recheck labs today.  Would consider SPEP and UPEP and 24-hr protein later if proteinuria on UA in future, or if anemia with CKD.  May consider post-renal causes in light of BPH and plans for TURP in Dec or Jan, pending further workup/surgery on back. Will try to get better BP control by increasing lisinopril. Orders: [facility]- Est Level  5 ([id number])Future Orders: Comp Met-[facility] ([id number]) ... 03/07/2011 Lipid-[facility] ([id number]) ... 03/13/2011 CBC-[facility] (46962) ... 03/14/2011 Vit D, 25 OH-[facility] ([id number]) ... 03/13/2011  Problem # 2:  HYPERLIPIDEMIA (ICD-272.4) Increase lisinopril to 20mg  daily (from 10mg  daily).  His updated medication list for this problem includes:    Simvastatin 40 Mg Tabs (Simvastatin) ..... Sig: take 1 tab by mouth once daily  Problem # 3:  ACQUIRED KERATODERMA (ICD-701.1)  Two round raised skin lesions on R cheek, applied liquid nitrogen.  discussed expectation that may blister and should scar/heal over.  Given red flags to contact, including if enlarges/painful.  Orders: [facility]- Est Level  5 (01027)  Complete Medication List: 1)  Avodart 0.5 Mg Caps (Dutasteride) 2)  Doxazosin Mesylate 8 Mg Tabs (Doxazosin mesylate) .Marland Kitchen.. 1 by mouth at bedtime 3)  Simvastatin 40 Mg Tabs (Simvastatin) .... Sig: take 1 tab by mouth once daily 4)  Felodipine 10 Mg Xr24h-tab (Felodipine) .... Sig: take 1 tablet by mouth daily 5)  Polyethylene Glycol 3350 Powd (Polyethylene glycol 3350) .... Sig: 17g (heaping teaspoon) dissolved in 8 oz water, drink once daily disp: 1 cannister 6)  Levitra 20 Mg Tabs (Vardenafil hcl) .... Take 1/2 to 1  tablet by mouth at bedtime as needed 7)  Multivitamins Caps (Multiple vitamin) 8)  Bayer Childrens Aspirin 81 Mg Chew (Aspirin) 9)  Tramadol Hcl 50 Mg Tabs (Tramadol hcl) .... One tab by mouth q4-6h as needed for pain. 10)  Lisinopril 20 Mg Tabs (Lisinopril) .Marland Kitchen.. 1 by mouth once daily  Other Orders: Urinalysis-[facility] (00000) Influenza Vaccine MCR (25366) Future Orders: TSH-[facility] ([id number]) ... 03/14/2011  Patient Instructions: 1)  It was a pleasure to see you today.   2)  I am asking that you increase your lisinopril from 10 to 20mg  daily.  I will send a new prescription to your pharmacy.  3)  I am ordering fasting labs to check your kidney function, blood count, lipids, liver function, vitamin D level, and thyroid function.  I will call you with the results to your home phone.  4)  I applied liquid nitrogen to two lesions on the right  cheek.  They may blister up and then should scar over and heal.  If they do not heal, or if they get larger, please contact me.  Prescriptions: LISINOPRIL 20 MG TABS (LISINOPRIL) 1 by mouth once daily  #30 x 11   Entered and Authorized by:   Paula Compton MD   Signed by:   Paula Compton MD on 03/15/2010   Method used:   [facility] to        [facility] [phone]* (retail)       [address]       [place], [place]  [address]       Ph: [phone]       Fax: [phone]   RxID:   [id number]    Orders Added: 1)  Urinalysis-[facility] [00000] 2)  Influenza Vaccine MCR [00025] 3)  Comp Met-[facility] [[id number]] 4)  Lipid-[facility] [80061-22930] 5)  CBC-[facility] [85027] 6)  Vit D, 25 OH-[facility] [36644-03474] 7)  TSH-[facility] [25956-38756] 8)  [facility]- Est Level  5 [43329]   Immunizations Administered:  Influenza Vaccine # 1:    Vaccine Type: Fluvax MCR    Site: left deltoid    Mfr: GlaxoSmithKline    Dose: 0.5 ml    Route: IM    Given by: Arlyss Repress CMA,    Exp. Date: 10/26/2010    Lot #: [id number]    VIS given: 11/20/09 version given  March 15, 2010.  Flu Vaccine Consent Questions:    Do you have a history of severe allergic reactions to this vaccine? no    Any prior history of allergic reactions to egg and/or gelatin? no    Do you have a sensitivity to the preservative Thimersol? no    Do you have a past history of Guillan-Barre Syndrome? no    Do you currently have an acute febrile illness? no    Have you ever had a severe reaction to latex? no    Vaccine information given and explained to patient? yes   Immunizations Administered:  Influenza Vaccine # 1:    Vaccine Type: Fluvax MCR    Site: left deltoid    Mfr: GlaxoSmithKline    Dose: 0.5 ml    Route: IM    Given by: Arlyss Repress CMA,    Exp. Date: 10/26/2010    Lot #: [id number]    VIS given: 11/20/09 version given March 15, 2010.  Laboratory Results   Urine Tests  Date/Time Received: March 15, 2010 2:45 PM  Date/Time Reported: March 15, 2010 3:01 PM   Routine Urinalysis   Color: yellow Appearance: Clear Glucose: negative   (Normal Range: Negative) Bilirubin: negative   (Normal Range: Negative) Ketone: negative   (Normal Range: Negative) Spec. Gravity: 1.015   (Normal Range: 1.003-1.035) Blood: negative   (Normal Range: Negative) pH: 5.5   (Normal Range: 5.0-8.0) Protein: negative   (Normal Range: Negative) Urobilinogen: 0.2   (Normal Range: 0-1) Nitrite: negative   (Normal Range: Negative) Leukocyte Esterace: negative   (Normal Range: Negative)    Comments: ...........test performed by...........Marland KitchenTerese Door, CMA        Prevention & Chronic Care Immunizations   Influenza vaccine: Fluvax MCR  (03/15/2010)   Influenza vaccine due: 05/09/2009    Tetanus booster: 10/07/2007: given   Tetanus booster due: 10/06/2017    Pneumococcal vaccine: Pneumovax  (09/04/2009)    H. zoster vaccine: Not documented  Colorectal Screening   Hemoccult: Not documented   Hemoccult due: Not  Indicated    Colonoscopy: Done.   (01/27/2004)   Colonoscopy due: 01/26/2014  Other Screening   PSA: 3.68  (10/05/2007)   PSA due due: 10/04/2008   Smoking status: quit  (03/15/2010)  Lipids   Total Cholesterol: 173  (10/05/2007)   LDL: 106  (10/05/2007)   LDL Direct: 103  (09/04/2009)   HDL: 54  (10/05/2007)   Triglycerides: 64  (10/05/2007)    SGOT (AST): 19  (09/04/2009)   SGPT (ALT): 13  (09/04/2009) CMP ordered    Alkaline phosphatase: 50  (09/04/2009)   Total bilirubin: 0.5  (09/04/2009)  Hypertension   Last Blood Pressure: 132 / 79  (03/15/2010)   Serum creatinine: 1.63  (09/04/2009)   Serum potassium 3.8  (09/04/2009) CMP ordered   Self-Management Support :   Personal Goals (by the next clinic visit) :      Personal blood pressure goal: 140/90  (09/04/2009)     Personal LDL goal: 130  (09/04/2009)    Hypertension self-management support: Not documented    Lipid self-management support: Not documented

## 2010-05-30 NOTE — Letter (Signed)
Summary: *Referral Letter  [facility]  [address]   [place], [place] [address]   Phone: [phone]  Fax: [phone]    02/05/2007  Dr. Dominica Severin [facility]  Dear Dr. Amanda Pea:  Thank you in advance for agreeing to see my patient:  Paul [name] [address] 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  We have provided some symptomatic care and given him a thumb loop wrist wrap.  However, he continues to have pain and needs more definitive Dx and RX.  I thought he was getting some ulnar abutment on wrist extension but felt TFCC was probably OK.  Procedures Requested: Your evaluation and suggestion as to diagnosis and treatment plan.  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) 2)  CONTUSION, RIGHT HAND (ICD-923.20) 3)  HYPOKALEMIA (ICD-276.8) 4)  DIARRHEA, PRESUMED INFECTIOUS ORIGIN (ICD-009.3) 5)  SCIATICA, RIGHT (ICD-724.3) 6)  HYPERTENSION, BENIGN SYSTEMIC (ICD-401.1) 7)  HYPERLIPIDEMIA (ICD-272.4) 8)  ESOPHAGITIS, UNSPECIFIED (ICD-530.10) 9)  ERECTILE DYSFUNCTION (ICD-302.74) 10)  BPH (ICD-600) 11)  BACK PAIN, LOW (ICD-724.2) 12)  ASTHMA, UNSPECIFIED (ICD-493.90) 13)  BACK PAIN, LUMBAR (ICD-724.2) 14)  DEGENERATIVE DISC DISEASE, LUMBAR SPINE (ICD-722.52) 15)  KNEE PAIN (ICD-719.46) 16)  CELLULITIS/ABSCESS, DIGIT NOS (ICD-681.9)   Current Medications: 1)  AVODART 0.5 MG CAPS (DUTASTERIDE)  2)  DOXAZOSIN MESYLATE 8 MG TABS (DOXAZOSIN MESYLATE)  3)  FELODIPINE 10 MG TB24 (FELODIPINE)  4)  LISINOPRIL-HYDROCHLOROTHIAZIDE 10-12.5 MG TABS (LISINOPRIL-HYDROCHLOROTHIAZIDE) Take 1 tablet by mouth once a day 5)  SIMVASTATIN 40 MG TABS (SIMVASTATIN)    Past Medical History: 1)  cardiac cath 1978 - normal,  creatinine 1.6 in 2006, etoh - quit 6 yrs ago, hemorrhoids 2000, smoked for 10 yrs quit 1974, tinea cruris    Thank you again for agreeing to see our patient; please contact us if you have any further questions or need additional information.  Sincerely,     [name] MD

## 2010-05-30 NOTE — Assessment & Plan Note (Signed)
Summary: side pain wp   Vital Signs:  Patient Profile:   76 Years Old Male Height:     68.5 inches Weight:      198.7 pounds BMI:     29.88 Pulse rate:   69 / minute BP sitting:   150 / 80  Pt. in pain?   no  Vitals Entered By: Arlyss Repress CMA, (June 09, 2008 2:41 PM)              Is Patient Diabetic? No      PCP:  [name] MD  Chief Complaint:  f/up left upper abdomen pain. last night pain 7/10.  History of Present Illness: Patient returns today for c/o intermittent pain in the LUQ abdomen.  Has had this intermittently for some time.  Reports that last noc it was particularly bothersome (8/10; usually about a 2-3/10).  May belch or pass flatus occasionally with this pain.  Not radiating to back or substernally; not associated with void. No fevers or chills.  No blood or mucus in stool.    Of note, he had fried zucchini, spaghetti, oily string beans earlier in the day yesterday, as well as chocolate candy.  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 This morning he had a bowel movement that was formed and not unusual for him.   PMHx; Had screening colonoscopy in 2005, unremarkable.  Unaware of any prior history of diverticulosis or diverticulitis.  Has some BPH which he has treated with avodart and doxazosin.     Current Allergies: No known allergies       Physical Exam  General:     Well-developed,well-nourished,in no acute distress; alert,appropriate and cooperative throughout examination Abdomen:     Bowel sounds positive,abdomen soft and non-tender without masses, organomegaly or hernias noted. No CVA tenderness.  Rectal:     No fissures or ext hemorrhoids visible.  Prostate smooth, symmetric, nontender and enlarged.  Guaiac negative stool in office.  Genitalia:     No inguinal hernias on exam. Testes symmetric and nontender, no masses.     Impression & Recommendations:  Problem # 1:  ABDOMINAL PAIN, LEFT UPPER  QUADRANT (ICD-789.02) I believe the most likely explanation for the LUQ pain is constipation and gas pain.  Will try a daily bulk-forming laxative.  He is also advised to drink plenty of water.  Do not think that imaging will be of use.  Nothing to suggest gallstone-related pain (given location and nature of pain).  Orders: [facility]- Est Level  3 (54098)   Complete Medication List: 1)  Avodart 0.5 Mg Caps (Dutasteride) 2)  Doxazosin Mesylate 8 Mg Tabs (Doxazosin mesylate) 3)  Felodipine 10 Mg Tb24 (Felodipine) 4)  Simvastatin 40 Mg Tabs (Simvastatin) 5)  Lisinopril 10 Mg Tabs (Lisinopril) .Marland Kitchen.. 1 by mouth daily 6)  Colchicine 0.6 Mg Tabs (Colchicine) .Marland Kitchen.. 1 by mouth bid 7)  Lisinopril-hydrochlorothiazide 10-12.5 Mg Tabs (Lisinopril-hydrochlorothiazide) .... Take one tablet daily 8)  Diazepam 5 Mg Tabs (Diazepam) .Marland Kitchen.. 1 by mouth at bedtime as needed 9)  Tramadol Hcl 50 Mg Tabs (Tramadol hcl) .Marland Kitchen.. 1 by mouth q 6 hrs as needed 10)  Felodipine 10 Mg Xr24h-tab (Felodipine) .... Sig: take 1 tablet by mouth daily 11)  Polyethylene Glycol 3350 Powd (Polyethylene glycol 3350) .... Sig: 17g (heaping teaspoon) dissolved in 8 oz water, drink once daily disp: 1 cannister    Prescriptions: POLYETHYLENE GLYCOL 3350  POWD (POLYETHYLENE GLYCOL 3350) SIG: 17g (heaping  teaspoon) dissolved in 8 oz water, drink once daily DISP: 1 cannister  #1 x 6   Entered and Authorized by:   Paula Compton MD   Signed by:   Paula Compton MD on 06/09/2008   Method used:   [facility] [phone]* (retail)       [address] [place], [place]  [address]       Ph: [phone]       Fax: [phone]   RxID:   [id number]

## 2010-05-30 NOTE — Progress Notes (Signed)
Summary: Appointment for Stress test  Phone Note Call from Patient Call back at Home Phone [phone] Call back at [phone]   Summary of Call: pt states she spoke with Dr Darrick Penna and needs a stress test set up Initial call taken by: Haydee Salter,  September 28, 2006 2:31 PM  Follow-up for Phone Call        pt is checking status of appt for stress test Follow-up by: ERIN LEVAN,  October 01, 2006 11:48 AM  Additional Follow-up for Phone Call Additional follow up Details #1::        left message telling pt appt for ETT is july 11th at 11:15am. will mail info to pt Additional Follow-up by: Lillia Pauls CMA,  October 05, 2006 10:19 AM

## 2010-05-30 NOTE — Progress Notes (Signed)
Summary: [facility] appt  [facility] appt   Imported By: Marily Memos 10/08/2009 15:15:20  _____________________________________________________________________  External Attachment:    Type:   Image     Comment:   External Document

## 2010-05-30 NOTE — Assessment & Plan Note (Signed)
Summary: fu/wrist injury/el   Vital Signs:  Patient Profile:   76 Years Old Male Height:     68.5 inches Weight:      197 pounds Pulse rate:   56 / minute BP sitting:   158 / 83  Vitals Entered By: Lillia Pauls CMA (January 05, 2007 11:07 AM)                 Chief Complaint:  FU R WRIST PAIN.  History of Present Illness: 76 y/o AAM comes in for followup of right wrist pain. Ulnar abuttment syndrome appears to be resolving. Pt is now able to locate pain on volar surface of wrist. Pt is an avid golfer sharp pain occurs when striking golf ball. Pain radiates proximally and is rated 8-9/10. Has been intermittently using NSAID gel.   Current Allergies: No known allergies       Physical Exam  Msk:     No deformity or scoliosis noted of thoracic or lumbar spine.     Wrist/Hand Exam  General:    Well-developed,well-nourished,in no acute distress; alert,appropriate and cooperative throughout examination  Wrist Exam:    Right:    Inspection:  Normal    Palpation:  Abnormal    Stability:  stable    Swelling:  no    Erythema:  no    Tenderness to palpation located at flexor digitorum profundus tendon sheath.    Pain with full wrist extension at volar wrist. Measurement of flexor tendon 25% greater at wrist than distal or proximal per U/S  Tinel's:    Tinel's negative over cubital, pronator, carpal, and Guyon's area.    Finkelstein's:    Right negative Snuff Box Tenderness:    Right negative Ulnocarpal Abutment:    Right negative    Impression & Recommendations:  Problem # 1:  ENTHESOPATHY, WRIST/CARPUS (ICD-726.4) Assessment: New R-wrist Orders: [facility]- Est  Level 4 ([address]109)   Problem # 2:  CONTUSION, RIGHT HAND (ICD-923.20) Assessment: Improved Wrist splint as needed. Orders: [facility]- Est  Level 4 (60454)   Complete Medication List: 1)  Avodart 0.5 Mg Caps (Dutasteride) 2)  Doxazosin Mesylate 8 Mg Tabs (Doxazosin mesylate) 3)  Felodipine 10 Mg Tb24  (Felodipine) 4)  Lisinopril-hydrochlorothiazide 10-12.5 Mg Tabs (Lisinopril-hydrochlorothiazide) .... Take 1 tablet by mouth once a day 5)  Simvastatin 40 Mg Tabs (Simvastatin) 6)  Prednisone 20 Mg Tabs (Prednisone) .Marland Kitchen.. 1 tab by mouth two times a day   Patient Instructions: 1)  Take Prednisone as directed. 2)  Continue to wear right wrist splint as needed. 3)  May use Voltaren gel daily with continued pain. 4)  Activity with golf as tolerated; may need different grip padding or change tension of grip. 5)  Please schedule a follow-up appointment as needed. Pt will call in 2 weeks for update.    Prescriptions: PREDNISONE 20 MG  TABS (PREDNISONE) 1 tab by mouth two times a day  #10 x 0   Entered and Authorized by:   [name] MD   Signed by:   [name] MD on 01/05/2007   Method used:   Electronically sent to ...       [facility] [address]       [address] [place], [place]  [address]       Ph: [phone]       Fax: [phone]   RxID:   [id number]

## 2010-05-30 NOTE — Progress Notes (Signed)
Summary: request to speak with MD  Phone Note Call from Patient Call back at Home Phone [phone]   Reason for Call: Talk to Doctor Summary of Call: pt is requesting to speak with Dr. Darrick Penna, sts he was referred to Dr. Thurston Hole and had an mri done, sts he is suppose to discuss having surgery and wants to speak with Dr. Darrick Penna first Initial call taken by: ERIN LEVAN,  April 07, 2007 3:44 PM  Follow-up for Phone Call        phone call to pt to explain that Dr. Darrick Penna is out of office until Monday 04/12/07. will send message to Dr. Darrick Penna. Follow-up by: Theresia Lo RN,  April 07, 2007 3:58 PM

## 2010-05-30 NOTE — Letter (Signed)
Summary: [facility]   Imported By: Marily Memos 10/04/2009 08:45:37  _____________________________________________________________________  External Attachment:    Type:   Image     Comment:   External Document

## 2010-05-30 NOTE — Progress Notes (Signed)
Summary: labs and meds order/ts  ---- Converted from flag ---- ---- 11/16/2007 5:25 PM, [name] MD wrote: before he went to hospital I had him on just lisinopril since he is now on lis + hctz (put in med list) he needs a Bmet and Uric acid in 6 weeks dx 401.1 and gout ------------------------------  called pt. advised of above. put meds in med list and future order in lab. pt will call for lab appt.Marland KitchenTHEKLA Jahmeek Shirk CMA,  November 17, 2007 9:59 AM

## 2010-05-30 NOTE — Assessment & Plan Note (Signed)
  Nurse Visit patient in for labs and ask for BP check. BP checked manually with  regular adult cuff .. BP LA 150/90,  RA 150/88 pulse 64. patient requested BP check with large cuff and this is done LA 150/88.  ( Regular cuff is more correct size for this patient.) states he has not taken BP meds this AM . usually takes earlier in the morning. advised patient to return in a week or so when he has taken meds to recheck again. Theresia Lo RN  March 26, 2010 10:49 AM   Allergies: No Known Drug Allergies  Orders Added: 1)  No Charge Patient Arrived (NCPA0) [NCPA0] I agree with plan.  Will await next BP reading before making further management decision. Paula Compton MD  March 27, 2010 8:31 AM

## 2010-05-30 NOTE — Progress Notes (Signed)
Summary: Medication/HCTZ  Phone Note Call from Patient Call back at Home Phone [phone]   Reason for Call: Refill Medication Summary of Call: pt is needing to speak with someone about his hctz - states he isn't receiving enough pills Initial call taken by: Haydee Salter,  July 17, 2006 12:09 PM  Follow-up for Phone Call        pt request #34 of HCTZ. called pharm. Follow-up by: Arlyss Repress CMA,,  July 17, 2006 12:30 PM

## 2010-05-30 NOTE — Progress Notes (Signed)
Summary: Referral  Phone Note Call from Patient Call back at Home Phone [phone]   Summary of Call: Pt is checking status of referral for orthopedic surgeon. Initial call taken by: Haydee Salter,  March 04, 2007 4:43 PM  Follow-up for Phone Call        CALLED PT. WILL SCHED APPT WITH DR. Thurston Hole Follow-up by: Arlyss Repress CMA,,  March 04, 2007 5:26 PM  Additional Follow-up for Phone Call Additional follow up Details #1::        appt sched. with dr.wainer for 03-11-07 at 9am. called pt and LMAM with info. faxed info to dr.wainer Additional Follow-up by: Arlyss Repress CMA,,  March 05, 2007 11:55 AM

## 2010-05-30 NOTE — Miscellaneous (Addendum)
Summary: Handicapped Placard  Patient dropped off form for Handicapped placard. Please call him when completed. Paul [name]  May 20, 2010 4:29 PM  I called patient regarding temporary handicapped placard.  He is unable to walk 200 feet without stopping, due to back pain.  Will complete form; I have told him it will be at the front desk momentarily.  Completed for 6 months. Paula Compton MD  May 21, 2010 9:07 AM  Appended Document: Handicapped Placard Phone call today 05/27/10  has a recurrence of some sxs in lower calf and ankle try tramadol again Dr Channing Mutters is now in [place] we will reck his back if it is not responding

## 2010-05-30 NOTE — Assessment & Plan Note (Signed)
Summary: wp   Vital Signs:  Patient Profile:   76 Years Old Male Height:     68.5 inches Weight:      145.9 pounds BMI:     21.94 Pulse rate:   71 / minute BP sitting:   114 / 74  (right arm)  Pt. in pain?   no  Vitals Entered By: Arlyss Repress CMA, (December 02, 2007 9:07 AM)              Is Patient Diabetic? No     PCP:  [name] MD  Chief Complaint:  right thigh...check growth. discuss exercises after knee replacement.  History of Present Illness: Paul [name] is 10 weks S/P left knee replacement Has done a vigorous rehab program Feels that he has made a lot of progress still gets knee swelling at night left knee still hot at times ices at night uses a pollow between legs walking some daily played golf 4 times last week!!! Alusio felt he was very stable at 8 weeks and OK to increase activity does workouts at [facility]  has a soft lump in Rt hamstring wanted to have checked not painful now      Current Allergies: No known allergies       Physical Exam  General:     Well-developed,well-nourished,in no acute distress; alert,appropriate and cooperative throughout examination Head:     Normocephalic and atraumatic without obvious abnormalities. No apparent alopecia or balding. Msk:     left knee Warm but no effussion noted today lacks 10 deg of full extension has good flexion of 125 deg stable to lachman testing  Rt knee lacks 3 to 4 deg of full extension flexion to 135 deg stable exam without much DJD appearance no effusion  Good quad strength bilat left knee has VMO back 85%  Hmastring on left area of soft tissue irregularity feels like a partial avulsion of some tendonous tissue vs spasmed area of mm good strength non painful     Impression & Recommendations:  Problem # 1:  S/P ARTHRP KNE CONDYLE&PLATU MEDIAL&LAT CMPRTS (CPT-27447) continue on leg press quad extension HS curl abduction and adduction calf raises  I recommended not  pushing golf until full extension needs to work on extension and given supine and prone stretches for this ice 2 to 3 times a day if warm  resuem more full golf and activity if full extension by 4 weeks  reassured about Hamstring suspect minor injury and will prob respond to rehab Orders: [facility]- Est Level  3 (11914)   Problem # 2:  KNEE PAIN (ICD-719.46)  Orders: [facility]- Est Level  3 (78295)  much improved getting by on ice and adds meds only prn  Complete Medication List: 1)  Avodart 0.5 Mg Caps (Dutasteride) 2)  Doxazosin Mesylate 8 Mg Tabs (Doxazosin mesylate) 3)  Felodipine 10 Mg Tb24 (Felodipine) 4)  Simvastatin 40 Mg Tabs (Simvastatin) 5)  Lisinopril 10 Mg Tabs (Lisinopril) .Marland Kitchen.. 1 by mouth daily 6)  Colchicine 0.6 Mg Tabs (Colchicine) .Marland Kitchen.. 1 by mouth bid 7)  Lisinopril-hydrochlorothiazide 10-12.5 Mg Tabs (Lisinopril-hydrochlorothiazide) .... Take one tablet daily    ]

## 2010-05-30 NOTE — Progress Notes (Signed)
Summary: refill Percocet  Phone Note Refill Request Call back at Home Phone [phone] Message from:  Patient  Refills Requested: Medication #1:  PERCOCET 5-325 MG TABS SIG: Take 1/2 to 1 tablet by mouth every 8 hours as needed for extreme pain. Please call when ready   Initial call taken by: De Nurse,  October 30, 2009 11:00 AM  Follow-up for Phone Call        pcp is on vacation. sent to another team md to handle Follow-up by: Golden Circle RN,  October 30, 2009 11:01 AM     Prescriptions: PERCOCET 5-325 MG TABS (OXYCODONE-ACETAMINOPHEN) SIG: Take 1/2 to 1 tablet by mouth every 8 hours as needed for extreme pain  #60 x 0   Entered and Authorized by:   Zachery Dauer MD   Signed by:   Zachery Dauer MD on 10/30/2009   Method used:   Print then Give to Patient   RxID:   [id number]  Printed and put in [employer]. To call for pickup  Appended Document: refill Percocet called pt informed rx at front desk.

## 2010-05-30 NOTE — Progress Notes (Signed)
Summary: Request to speak with MD  Phone Note Call from Patient Call back at Home Phone [phone]   Reason for Call: Talk to Doctor Summary of Call: pt is requesting to speak with MD re: what was discussed with Dr. Thurston Hole Initial call taken by: Denny Peon LEVAN,  April 14, 2007 4:45 PM  Follow-up for Phone Call        I called him and discussed knee replacement options. Follow-up by: [name] MD,  April 15, 2007 2:33 PM

## 2010-05-30 NOTE — Consult Note (Signed)
[facility]   Imported By: De Nurse 01/08/2010 10:52:25  _____________________________________________________________________  External Attachment:    Type:   Image     Comment:   External Document

## 2010-05-30 NOTE — Consult Note (Signed)
Summary: [facility]   Imported By: Clydell Hakim 08/16/2009 12:13:57  _____________________________________________________________________  External Attachment:    Type:   Image     Comment:   External Document

## 2010-05-30 NOTE — Assessment & Plan Note (Signed)
Summary: knee pain   Vital Signs:  Patient Profile:   76 Years Old Male Height:     68.5 inches Weight:      203.6 pounds BMI:     30.62 Temp:     97.8 degrees F Pulse rate:   71 / minute BP sitting:   135 / 79  (left arm)  Pt. in pain?   yes    Location:   left knee    Intensity:   8  Vitals Entered By: Dedra Skeens CMA, (February 18, 2007 3:10 PM)                  PCP:  [name] MD  Chief Complaint:  left knee pain.  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  Has seen Dr Darrick Penna for this about 1-2 weeks ago.  Diagnosed with Baker's cyst. Advised to do quad strengthening exercises.  Is wearing a neoprene brace.  Has had X-rays in February that showed medial and subpatellar degeneration joint disease.  He has tried some sort of compound like ben gay without improvement.  He had one hyperextension episode that has seemed to make his symptoms worse.  The pain is on the medial side of left knee and posterior fossa.  Current Allergies: No known allergies       Physical Exam  General:     Well-developed,well-nourished,in no acute distress; alert,appropriate and cooperative throughout examination Msk:     THe left knee is visibly swollen with no eythema or warmth.  There is a baker's cyst palpable posteriorly.  THere is medial joint line tenderness worse posteriorly.  He has some dependent edema that has localized between his sock line and the end of the brace.  His ligaments are all intact including PCL, ACL, MCL, LCL.  He has good ROM.  His strength is 4+/5 on the left quadraceps.    Impression & Recommendations:  Problem # 1:  OSTEOARTHRITIS, KNEE, LEFT (ICD-715.96) Assessment: Deteriorated Risks and benefits discussed and verbal consent obtained for injection of steriod and local anesthetic to left knee.  Using sterile technique, 4mL of marcaine 0.25% with 1mL of kenalog 40 were injected with the bent knee technique into  the lateral side of the jiont.  The patient tolerated the procedure well with no EBL.  Injection performed.  He never had one before. Orders: Injection, large joint- [facility] ([address])   Complete Medication List: 1)  Avodart 0.5 Mg Caps (Dutasteride) 2)  Doxazosin Mesylate 8 Mg Tabs (Doxazosin mesylate) 3)  Felodipine 10 Mg Tb24 (Felodipine) 4)  Lisinopril-hydrochlorothiazide 10-12.5 Mg Tabs (Lisinopril-hydrochlorothiazide) .... Take 1 tablet by mouth once a day 5)  Simvastatin 40 Mg Tabs (Simvastatin)     ]

## 2010-05-30 NOTE — Assessment & Plan Note (Signed)
Summary: ETT/NM   Primary Provider:  [name] MD   History of Present Illness: Paul [name] has experienced some dizziness with exercise and has multiple CAD risk factors:  Age High level Physical Activity HBP High cholesterol  For ETT:  Bruce Protocol 10 mins Max HR 162 16 beat recovery at 1 min No siginficant ST/ T change  High Fitness level for age - 11 METS and est VO2 Max of 39  Allergies: No Known Drug Allergies   Impression & Recommendations:  Problem # 1:  DIZZINESS (ICD-780.4) with normal ETT suspect this is hydration issue  advised in terms fluid intake for sport  Problem # 2:  HYPERTENSION, BENIGN SYSTEMIC (ICD-401.1)  His updated medication list for this problem includes:    Doxazosin Mesylate 8 Mg Tabs (Doxazosin mesylate)    Felodipine 10 Mg Tb24 (Felodipine)    Lisinopril 10 Mg Tabs (Lisinopril) .Marland Kitchen... 1 by mouth daily    Lisinopril-hydrochlorothiazide 10-12.5 Mg Tabs (Lisinopril-hydrochlorothiazide) .Marland Kitchen... Take one tablet daily    Felodipine 10 Mg Xr24h-tab (Felodipine) ..... Sig: take 1 tablet by mouth daily  Problem # 3:  HYPERLIPIDEMIA (ICD-272.4)  His updated medication list for this problem includes:    Simvastatin 40 Mg Tabs (Simvastatin)  Complete Medication List: 1)  Avodart 0.5 Mg Caps (Dutasteride) 2)  Doxazosin Mesylate 8 Mg Tabs (Doxazosin mesylate) 3)  Felodipine 10 Mg Tb24 (Felodipine) 4)  Simvastatin 40 Mg Tabs (Simvastatin) 5)  Lisinopril 10 Mg Tabs (Lisinopril) .Marland Kitchen.. 1 by mouth daily 6)  Colchicine 0.6 Mg Tabs (Colchicine) .Marland Kitchen.. 1 by mouth bid 7)  Lisinopril-hydrochlorothiazide 10-12.5 Mg Tabs (Lisinopril-hydrochlorothiazide) .... Take one tablet daily 8)  Diazepam 5 Mg Tabs (Diazepam) .Marland Kitchen.. 1 by mouth at bedtime as needed 9)  Tramadol Hcl 50 Mg Tabs (Tramadol hcl) .Marland Kitchen.. 1 by mouth q 6 hrs as needed 10)  Felodipine 10 Mg Xr24h-tab (Felodipine) .... Sig: take 1 tablet by mouth daily 11)  Polyethylene Glycol 3350 Powd (Polyethylene  glycol 3350) .... Sig: 17g (heaping teaspoon) dissolved in 8 oz water, drink once daily disp: 1 cannister 12)  Voltaren 1 % Gel (Diclofenac sodium) .... Apply 1g to affected area four times daily disp: 80 g or largest 13)  Levitra 20 Mg Tabs (Vardenafil hcl) .... Take 1/2 to 1 tablet by mouth at bedtime as needed Prescriptions: LEVITRA 20 MG TABS (VARDENAFIL HCL) take 1/2 to 1 tablet by mouth at Bedtime as needed  #30 x 6   Entered by:   Terese Door   Authorized by:   [name] MD   Signed by:   Terese Door on 11/14/2008   Method used:   [facility]        [facility] [phone][facility] (retail)       [address]       [place], [place]  [address]       Ph: [phone]       Fax: [phone]   RxID:   [id number]

## 2010-05-30 NOTE — Consult Note (Signed)
Summary: [facility]   Imported By: De Nurse 06/01/2009 17:20:01  _____________________________________________________________________  External Attachment:    Type:   Image     Comment:   External Document

## 2010-05-30 NOTE — Assessment & Plan Note (Signed)
Summary: NAUSEA AND DIARRHEA 4 DAYS/LS  Medications Added AVODART 0.5 MG CAPS (DUTASTERIDE)  DOXAZOSIN MESYLATE 8 MG TABS (DOXAZOSIN MESYLATE)  FELODIPINE 10 MG TB24 (FELODIPINE)  LISINOPRIL-HYDROCHLOROTHIAZIDE 10-12.5 MG TABS (LISINOPRIL-HYDROCHLOROTHIAZIDE) Take 1 tablet by mouth once a day SIMVASTATIN 40 MG TABS (SIMVASTATIN)       Allergies Added: NKDA  Vital Signs:  Patient Profile:   76 Years Old Male Height:     68.5 inches Weight:      196.1 pounds BMI:     29.49 Temp:     98 degrees F Pulse rate:   78 / minute BP sitting:   103 / 63  (right arm)  Pt. in pain?   no  Vitals Entered By: Theresia Lo RN (December 07, 2006 9:07 AM)              Is Patient Diabetic? No   Chief Complaint:  DIARRHEA AND NAUSEA FOR 4 DAYS and ER VISIT 12/01/06.  History of Present Illness: Pt c/o of diarrhea: pt had hamburger at a local golf even last tuesday.  pt went 24 hour without any problems, then began having nausea, no diarrhea, no vomiting, difficulty standing up and did not feel well.  pt went to ED and told he was slightly dehydrated with elevated creatine and low potassium.  Potassium supplementation provided.  Pt then were not nauseated, but went to [place], , and then began started having diarrhea.  subsequently continued to have off and on diarrhea somewhat dependent on food choices since then and even last night.  No stomach cramps since eating breakfast this morning.  NO nausea, no vomiting.  No pronouced blood in stool.  Diarrhe was very watery.  No fever or chills.  no sick contacts.  pt drank water and gatorade of 3 quarts in last 24 hours and feeling somewhat better.      Current Allergies: No known allergies   Past Medical History:    Reviewed history from 06/25/2006 and no changes required:       cardiac cath 1978 - normal, creatinine 1.6 in 2006, etoh - quit 6 yrs ago, hemorrhoids 2000, smoked for 10 yrs quit 1974, tinea cruris  Past Surgical History:  Reviewed history from 06/25/2006 and no changes required:       05-26-06 TC 189, TG 63, HDL 60, LDL 116 - 05/27/2006, echo  55% - 03/16/2003, ett 7 2001 highfitness/ no CP - 11/20/2000, ett feb 2000 high fitness level -, ett july 2006 hgh fitness level - 11/12/2004, flex sig 1998 -   Family History:    Reviewed history from 06/25/2006 and no changes required:       3 male sibs died - 2 with cancer, 3 male sibs died with CVD and DM, 6 male sibs 1 panc ca/ 1 bone ca/ 1 with MI, 17 male sibs/ 3 with mis/ 1 aodm, father died kidney failure in 27s, mother died with aodm comps in 54s  Social History:    Reviewed history from 06/25/2006 and no changes required:       professor at [employer]; avid golfer; 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  Eyes:     No corneal or conjunctival inflammation noted. EOMI. Perrla.. Vision grossly normal.  Ears:     External ear exam shows no significant lesions or deformities.  Otoscopic examination reveals clear canals, tympanic membranes are intact bilaterally without bulging, retraction, inflammation or discharge. Hearing is grossly normal bilaterally. Nose:     External nasal examination shows no deformity or inflammation. Nasal mucosa are pink and moist without lesions or exudates. Mouth:     Oral mucosa and oropharynx without lesions or exudates.  Teeth in good repair. Neck:     No deformities, masses, or tenderness noted. Lungs:     Normal respiratory effort, chest expands symmetrically. Lungs are clear to auscultation, no crackles or wheezes. Heart:     Normal rate and regular rhythm. S1 and S2 normal without gallop, murmur, click, rub or other extra sounds. Abdomen:     Bowel sounds positive,abdomen soft  and non-tender without masses, organomegaly or hernias noted. 2 cm ecchymochosis over mid epigastric region and L mid queadrant, presnt for 1 year per patient.   Msk:     No deformity or scoliosis noted of thoracic or lumbar spine.      Impression & Recommendations:  Problem # 1:  DIARRHEA, PRESUMED INFECTIOUS ORIGIN (ICD-009.3) Presumed infectious of viral etiology.  will continue bland diet and hydrate with electrolyte replacement fluid.  will check cbc for acute process or eosinophils for parasitic reaction.  pt to return if no improvement in next 5 days.   Orders: [facility]- Est Level  3 (99213) Comp Met-[facility] ([id number]) CBC w/Diff-[facility] (09811)   Problem # 2:  HYPERTENSION, BENIGN SYSTEMIC (ICD-401.1) Pt will hold his ace- inhibitor/hctz during acute dehydration stage at this time.  will restart in 5 days if symptoms improved.  will recheck creatinine since elevated to 1.8 last week when in ed.  baseline 1.6  His updated medication list for this problem includes:    Doxazosin Mesylate 8 Mg Tabs (Doxazosin mesylate)    Felodipine 10 Mg Tb24 (Felodipine)    Lisinopril-hydrochlorothiazide 10-12.5 Mg Tabs (Lisinopril-hydrochlorothiazide) .Marland Kitchen... Take 1 tablet by mouth once a day  Orders: [facility]- Est Level  3 (99213) Comp Met-[facility] ([id number])   Problem # 3:  HYPOKALEMIA (ICD-276.8) pt noted to be hypokalemic while in ED plus with diarrhea syndrome will check electrolyes and replace if needed.   Orders: Comp Met-[facility] [phone])   Complete Medication List: 1)  Avodart 0.5 Mg Caps (Dutasteride) 2)  Doxazosin Mesylate 8 Mg Tabs (Doxazosin mesylate) 3)  Felodipine 10 Mg Tb24 (Felodipine) 4)  Lisinopril-hydrochlorothiazide 10-12.5 Mg Tabs (Lisinopril-hydrochlorothiazide) .... Take 1 tablet by mouth once a day 5)  Simvastatin 40 Mg Tabs (Simvastatin)   Patient Instructions: 1)  Please schedule a follow-up appointment as needed, if your symptomms persist for more than 5 additional days.   Come in for stool studies.   2)  Stop HCTZ/Lisinopril for the next 5 days.   3)  Drink plenty of water and gatorade (G2).  6-8 glasses a day of 8 oz.   4)  stick with a bland diet for next 2 days then, increase as tolerated.   5)  [phone] cell          Vital Signs:  Patient Profile:   76 Years Old Male Height:     68.5 inches Weight:      196.1 pounds BMI:     29.49 Temp:     98 degrees F Pulse rate:   78 / minute BP sitting:   103 / 63  (right arm)  Pt. in pain?   no  Vitals  Entered By: Theresia Lo RN (December 07, 2006 9:07 AM)              Is Patient Diabetic? No

## 2010-05-30 NOTE — Miscellaneous (Signed)
  Clinical Lists Changes  Problems: Removed problem of DIZZINESS (ICD-780.4) Removed problem of BACK PAIN, LUMBAR (ICD-724.2) Removed problem of SPRAIN&STRAIN UNSPECIFIED SITE ELBOW&FOREARM (ICD-841.9) Removed problem of HIV EXPOSURE, POSSIBLE (ICD-V15.89) Removed problem of SPECIAL SCREENING MALIGNANT NEOPLASM OF PROSTATE (ICD-V76.44) Removed problem of LEG EDEMA, LEFT (ICD-782.3) Removed problem of CALF PAIN, LEFT (ICD-729.5) Removed problem of ENTHESOPATHY, WRIST/CARPUS (ICD-726.4) Removed problem of CONTUSION, RIGHT HAND (ICD-923.20) Removed problem of HYPOKALEMIA (ICD-276.8) Removed problem of DIARRHEA, PRESUMED INFECTIOUS ORIGIN (ICD-009.3) Removed problem of ESOPHAGITIS, UNSPECIFIED (ICD-530.10) Removed problem of BPH (ICD-600) Removed problem of ELBOW PAIN, LEFT (ICD-719.42) Removed problem of SHOULDER PAIN, LEFT (ICD-719.41) Removed problem of BACK PAIN, LUMBAR (ICD-724.2) Removed problem of KNEE PAIN (ICD-719.46) Removed problem of CELLULITIS/ABSCESS, DIGIT NOS (ICD-681.9) Removed problem of NEOPLASM, MALIGNANT, PANCREAS, FAMILY HX (ICD-V16.0) Removed problem of BACK PAIN, LOW (ICD-724.2) Removed problem of BAKER'S CYST, LEFT KNEE (ICD-727.51) Removed problem of ABDOMINAL PAIN, LEFT UPPER QUADRANT (ICD-789.02) Removed problem of SCIATICA, RIGHT (ICD-724.3) Removed problem of Status post  ARTHRP KNE CONDYLE&PLATU MEDIAL&LAT CMPRTS ([id number])

## 2010-05-30 NOTE — Assessment & Plan Note (Signed)
Summary: leaving nursing home/wp   Vital Signs:  Patient Profile:   75 Years Old Male Height:     68.5 inches Weight:      196.3 pounds BMI:     29.52 Pulse rate:   71 / minute BP sitting:   166 / 99  (right arm)  Pt. in pain?   no  Vitals Entered By: Renato Battles slade,cma              Is Patient Diabetic? No    TD Result Date:  10/07/2007 TD Result:  given TD Next Due:  10 yr   PCP:  [name] MD  Chief Complaint:  physical. has not been taking HTN meds today. f/up labs.  History of Present Illness: Current Problems:   ERECTILE DYSFUNCTION (ICD-302.74) uses these sparingly sometimes has good fxn without drugs  BPH (ICD-600) This is being treated by dr Andee Poles restarted avodart 3 mos ago PSA has come done from 5.3 to present level  ASTHMA, UNSPECIFIED (ICD-493.90) this has been inactive since a 1 month rx with inhaled CS   DJD surgery with total knee on may 18 by dr Despina Hick Was kept at [facility] for 8 to 10 days now home and going to PT went to pT this AM (note BP prob higher sec ot this) PT once daily for next week  other joints - seems that other joints are doing well LBP has been much better since starting yoga 2 years ago/ does older adult class also does pilates for 30 mins  HBP most checks have been good  goes by fire station and has had levels high norm  Gout affects left 4th PIP possibly affected left knee as this also improved on colchicine    Current Allergies: No known allergies   Past Medical History:    cardiac cath 1978 - normal,     creatinine 1.6 in 2006    etoh - quit 6 yrs ago,    hemorrhoids 2000     smoked for 10 yrs quit 1974     tinea cruris  Past Surgical History:    05-26-06 TC 189, TG 63, HDL 60, LDL 116 - 05/27/2006    echo  55% - 03/16/2003     ett 7 2001 highfitness/ no CP - 11/20/2000, ett feb 2000 high fitness level , ett july 2006 hgh fitness level - 11/12/2004    flex sig 1998 -     colonsocopy 2006 by  buccini   Family History:    3 male sibs died - 2 with cancer,     3 male sibs died with CVD and DM,     6 male sibs 1 panc ca/ 1 bone ca/ 1 with MI     26 male sibs/ 3 with mis/ 1 aodm     father died kidney failure in 31s     mother died with aodm comps in 26s  Social History:    professor at [employer];     avid Teacher, English as a foreign language;     no ETOH or tobacco    commutes to work from [place]    Review of Systems  The patient denies fever, chest pain, syncope, dyspnea on exertion, and abdominal pain.     Physical Exam  General:     Well-developed,well-nourished,in no acute distress; alert,appropriate and cooperative throughout examination Head:     Normocephalic and atraumatic without obvious abnormalities. No apparent alopecia or balding. Eyes:     No corneal or conjunctival inflammation  noted. EOMI. Perrla. Funduscopic exam benign, without hemorrhages, exudates or papilledema. Vision grossly normal. Mouth:     Oral mucosa and oropharynx without lesions or exudates.  Teeth in good repair. Neck:     No deformities, masses, or tenderness noted. Chest Wall:     No deformities, masses, tenderness or gynecomastia noted.  tatoo left breast area Lungs:     Normal respiratory effort, chest expands symmetrically. Lungs are clear to auscultation, no crackles or wheezes. Heart:     Normal rate and regular rhythm. S1 and S2 normal without gallop, murmur, click, rub or other extra sounds. Abdomen:     Bowel sounds positive,abdomen soft and non-tender without masses, organomegaly or hernias noted. Msk:     markedly swollen left knee wiht ant surgical excision and steristrips from recent surgery  Rt knne has good functional exam with neg mcmurrays and lachmans ankles stabel and good ROM  hips show 60 deg rot ROM seated  neck Good ROM and improved from before  flexion contracture PIP 4th bilat hands with LT > Rt  Shoulders - good ROM Extremities:     No clubbing, cyanosis, edema, or  deformity noted with normal full range of motion of all joints.    slight calf tenderness over med head of gastroc no deep tenderness    Impression & Recommendations:  Problem # 1:  GOUT, UNSPECIFIED (ICD-274.9)  His updated medication list for this problem includes:    Colchicine 0.6 Mg Tabs (Colchicine) .Marland Kitchen... 1 by mouth bid  keep colchicine watch meats etc Orders: [facility]- Est  Level 4 (04540)   Problem # 2:  OSTEOARTHRITIS, KNEE, LEFT (ICD-715.96) s/p replacemnt now needs to keep good rehab schedule ambulate with cane  other areas of OA we can follow Orders: [facility]- Est  Level 4 (98119)   Problem # 3:  HYPERTENSION, BENIGN SYSTEMIC (ICD-401.1)  His updated medication list for this problem includes:    Doxazosin Mesylate 8 Mg Tabs (Doxazosin mesylate)    Felodipine 10 Mg Tb24 (Felodipine)    Lisinopril 10 Mg Tabs (Lisinopril) .Marland Kitchen... 1 by mouth daily  most BP measures look good on home monitoring will need to follow and increase does as needed  cR now is only 1.37 which is improved  Orders: [facility]- Est  Level 4 (99214)   Problem # 4:  BPH (ICD-600) To follow up with dr Andee Poles on his PSA level  prob OK for his prior level comparison Orders: [facility]- Est  Level 4 (14782)   Complete Medication List: 1)  Avodart 0.5 Mg Caps (Dutasteride) 2)  Doxazosin Mesylate 8 Mg Tabs (Doxazosin mesylate) 3)  Felodipine 10 Mg Tb24 (Felodipine) 4)  Simvastatin 40 Mg Tabs (Simvastatin) 5)  Lisinopril 10 Mg Tabs (Lisinopril) .Marland Kitchen.. 1 by mouth daily 6)  Colchicine 0.6 Mg Tabs (Colchicine) .Marland Kitchen.. 1 by mouth bid  Other Orders: Tdap => 3yrs IM (95621) Admin 1st Vaccine (30865)   Patient Instructions: 1)  Constipation issues 2)  try to get 5 fruits per day 3)  every 2 days if you don't get BM take laxative 4)  work lt calf if you can to keep those mm strong 5)  try yoga belt for resistance   ]  Tetanus/Td Vaccine    Vaccine Type: Tdap    Site: right deltoid    Mfr: Sanofi  Pasteur    Dose: 0.5 ml    Route: IM    Given by: Arlyss Repress CMA,    Exp. Date: 06/03/2009  Lot #: [id number]    VIS given: 03/16/07 version given October 07, 2007.

## 2010-05-30 NOTE — Assessment & Plan Note (Signed)
Summary: ring finger joint swollen & painful   Vital Signs:  Patient Profile:   76 Years Old Male Height:     68.5 inches Pulse rate:   84 / minute BP sitting:   164 / 83  Vitals Entered By: Lillia Pauls CMA (May 14, 2007 8:51 AM)                 PCP:  [name] MD  Chief Complaint:  4th left finger swelling x 1 week.  History of Present Illness: Paul [name] comes in for evaluation of acute painful swelling of L 4th PIP joint x 1 week.  He denies any injury, but reports that his symptoms began spontaneously.  Since their onset he has noted some partial improvement of his swelling and pain.  Ice helps.  Unable to take NSAIDs 2/2 renal insufficiency.  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    At the same time as the finger has been swollen he has also noted marked L knee swelling, pain and redness that has gradually improved over the same time course.    Reports a similar episode in this finger and knee several months ago that resolved spontaneously.    Reports questionable history of gout.  Elevated uric acid level in past.  Has tried cutting back on dietary protein since sx started and thinks this is helping.  Current Allergies: No known allergies   Past Medical History:    Reviewed history from 06/25/2006 and no changes required:       cardiac cath 1978 - normal, creatinine 1.6 in 2006, etoh - quit 6 yrs ago, hemorrhoids 2000, smoked for 10 yrs quit 1974, tinea cruris   Family History:    Reviewed history from 06/25/2006 and no changes required:       3 male sibs died - 2 with cancer, 3 male sibs died with CVD and DM, 6 male sibs 1 panc ca/ 1 bone ca/ 1 with MI, 76 male sibs/ 3 with mis/ 1 aodm, father died kidney failure in 65s, mother died with aodm comps in 30s  Social History:    Reviewed history from 06/25/2006 and no changes required:       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  ?night sweats past few days.  Otherwise as per HPI.   Physical Exam  General:     alert, well-developed, and cooperative to examination.   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 Take 1 tablet by mouth once a day  His updated medication list for this problem includes:    Doxazosin Mesylate 8 Mg Tabs (Doxazosin mesylate)    Felodipine 10 Mg Tb24 (Felodipine)    Lisinopril 10 Mg Tabs (Lisinopril) .Marland Kitchen... 1 by mouth daily  this seems table and will monitor off diuretics Orders: [facility]- Est  Level 4 (99214)   Complete Medication List: 1)  Avodart 0.5 Mg Caps (Dutasteride) 2)  Doxazosin Mesylate 8 Mg Tabs (Doxazosin mesylate) 3)  Felodipine 10 Mg Tb24 (Felodipine) 4)  Simvastatin 40 Mg Tabs (Simvastatin) 5)  Prednisone 20 Mg Tabs (Prednisone) .... 3 tabs by mouth daily until complete 6)  Lisinopril 10 Mg Tabs (Lisinopril) .Marland Kitchen.. 1 by mouth daily   Patient Instructions: 1)  Follow low protein diet and diet low in purines. 2)  Take prednisone as  prescribed. 3)  Followup with Dr. Darrick Penna in 1 week.    Prescriptions: FELODIPINE 10 MG TB24 (FELODIPINE)   #34 Tablet x 12   Entered by:   Davy Pique MD   Authorized by:   [name] MD   Signed by:   Davy Pique MD on 05/14/2007   Method used:   Print then Give to Patient   RxID:   [id number] LISINOPRIL 10 MG  TABS (LISINOPRIL) 1 by mouth daily  #30 x 11   Entered by:   Davy Pique MD   Authorized by:   [name] MD   Signed by:   Davy Pique MD on 05/14/2007   Method used:   Print then Give to Patient   RxID:   [id number] PREDNISONE 20 MG  TABS (PREDNISONE) 3 tabs by mouth daily until complete  #15 x 0   Entered by:   Davy Pique MD   Authorized by:   [name] MD   Signed by:   Davy Pique MD on 05/14/2007   Method used:   Print then Give to Patient   RxID:   [id number]  ]

## 2010-05-30 NOTE — Assessment & Plan Note (Signed)
Summary: pain in calves/eo   Vital Signs:  Patient Profile:   76 Years Old Male Height:     68.5 inches Pulse rate:   91 / minute BP sitting:   114 / 65  Vitals Entered By: Lillia Pauls CMA (Aug 31, 2007 1:59 PM)                 PCP:  [name] MD  Chief Complaint:  L KNEE, HIP, and CALF PAIN X 3-4 WKS.  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 Pt. scheduled to have TKR with Dr. Merlyn Albert in 13 days. Has had recurrent effusions with 2 recent aspirations.  Calf pain for about 1 week. Has had LE edema. Altered gait for at least 1 month. Denies groin pain or recent trauma.   Recent exercise he feels has flared up knee, altered gait more. Leg presses 3 sets, 60 lbs for 20 reps, leg extensions, etc.     Current Allergies: No known allergies      Review of Systems       For overall ROS, please see HPI. patient denies fevers, chills, myalgias, chest pain, shortness of breath, and otherwise has no complaints other than those noted.    Physical Exam  General:     Well-developed,well-nourished,in no acute distress; alert,appropriate and cooperative throughout examination Head:     Normocephalic and atraumatic without obvious abnormalities. No apparent alopecia or balding. Lungs:     normal respiratory effort.     Hip Exam  Sensory:    Gross coordination and sensation were normal.  Hip Exam:    Right:    Inspection:  Normal    Palpation:  Normal    Stability:  stable    Tenderness:  no    Swelling:  no    Erythema:  no    Range of Motion:       Flexion-Active: 120       Extension-Active: 30       Internal Rotation-Active: 45       External Rotation-Active: 45       Flexion-Passive: 120       Extension-Passive: 30       Internal Rotation-Passive: 45       External  Rotation: 45    Left:    Inspection:  Normal    Palpation:  Abnormal    Stability:  stable    Tenderness:  no    Swelling:  no    Erythema:  no    TTP mildly at attachment of gluteus medius to ilium    Range of Motion:       Flexion-Active: 120       Extension-Active: 30       Internal Rotation-Active: 45       External Rotation-Active: 45       Flexion-Passive: 120       Extension-Passive: 30       Internal Rotation-Passive: 45       External Rotation: 45   Knee Exam  Knee Exam:    Right:    Inspection:  Normal    Palpation:  Normal    Stability:  stable    Tenderness:  no    Swelling:  no    Erythema:  no    Range of  Motion:       Flexion-Active: full       Extension-Active: full       Flexion-Passive: full       Extension-Passive: full    Left:    Inspection:  Abnormal    Palpation:  Abnormal    Stability:  stable    Tenderness:  popliteal    Swelling:  diffuse    Erythema:  no    large effusion, ballotable. tender along medial and lateral joint lines.     Range of Motion:       Flexion-Active: full       Extension-Active: full       Flexion-Passive: full       Extension-Passive: full    TTP deep posterior calf 2+ LE edema L sided    Impression & Recommendations:  Problem # 1:  CALF PAIN, LEFT (ICD-729.5) Assessment: New Obtain doppler to r/o DVT  More likely secondary to swelling and inflammation with altered gait and calf strain.   Orders: [facility]- Est  Level 4 (16109) Doppler Referral (Doppler)   Problem # 2:  LEG EDEMA, LEFT (ICD-782.3) Assessment: New As above, also with h/o some renal insufficiency, but less likely due to one sided nature.  Orders: [facility]- Est  Level 4 (60454) Doppler Referral (Doppler)   Problem # 3:  OSTEOARTHRITIS, KNEE, LEFT (ICD-715.96) Assessment: Deteriorated Also given an RX for pre and post-op for cane. Discussed its use.  Complete Medication List: 1)  Avodart 0.5 Mg Caps (Dutasteride) 2)  Doxazosin Mesylate  8 Mg Tabs (Doxazosin mesylate) 3)  Felodipine 10 Mg Tb24 (Felodipine) 4)  Simvastatin 40 Mg Tabs (Simvastatin) 5)  Lisinopril 10 Mg Tabs (Lisinopril) .Marland Kitchen.. 1 by mouth daily 6)  Colchicine 0.6 Mg Tabs (Colchicine) .Marland Kitchen.. 1 by mouth bid    ]  Appended Document: pain in calves/eo Note: duplex scan of vessels showed no evidence of DVT in lleft calf will inform patient and dr Despina Hick

## 2010-05-30 NOTE — Miscellaneous (Signed)
Summary: [employer] Plac[employer] form  pt dropped off form to be signed by dr.  Please call when ready. ..................................................................Marland KitchenKATHY HARRELSON  August 19, 2007 11:17 AM form placed in md chart box for signature.....................................................................[facility] CALDWELL RN  August 19, 2007 11:29 AM  called pt and LM stating was ready to pick up....WHITNEY POOLE 08/20/07 11:50am

## 2010-05-30 NOTE — Assessment & Plan Note (Signed)
Summary: F/U PER [facility]   Vital Signs:  Patient Profile:   76 Years Old Male Weight:      196 pounds Pulse rate:   72 / minute BP sitting:   133 / 75  Vitals Entered By: Lillia Pauls CMA (July 24, 2006 10:58 AM)               Chief Complaint:  F/U.  History of Present Illness: abscess drained yesterday.  on septra.  polymicrob cx, sens pend.  decrease in pain, swelling, erythema.  soaking several times a day.  no new complaints.  L knee pain. h/o arthritis.  NSAIDs previously avoided due to Cr ~1/6-->GFR calc  ~55.  recent decrease in  pain after taking aleve.  no popping/clicking/locking.  asking for advice re: meds.  using hydrocodone with some relief, no help with prev rx for tramadol.    Past Medical History:    Reviewed history from 06/25/2006 and no changes required:       cardiac cath 1978 - normal, creatinine 1.6 in 2006, etoh - quit 6 yrs ago, hemorrhoids 2000, smoked for 10 yrs quit 1974, tinea cruris   Family History:    Reviewed history from 06/25/2006 and no changes required:       3 male sibs died - 2 with cancer, 3 male sibs died with CVD and DM, 6 male sibs 1 panc ca/ 1 bone ca/ 1 with MI, 39 male sibs/ 3 with mis/ 1 aodm, father died kidney failure in 55s, mother died with aodm comps in 72s  Social History:    Reviewed history from 06/25/2006 and no changes required:       professor at [employer]; avid golfer; 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         see hpi   Physical Exam  General:     no apparent distress, normocephalic atraumatic, mucous membrans moist. no increase wob. R hand 3 digit w/dec in erythema.  neurovasc intact.  decrease in swelling.  incision site healing as expected.  lt knee slighly puffy.  no erythema.  no pain w/varus and valgus stress. + guard with lateral meniscus stress.  ACL intact.  + crepitus w/flex and ext (patellar).  quad/VMO wnl.  weak abductors of  hip bilateral lt > rt. distally neurovasc intact. feet: bilateral destruction of transverse arch and pes planus.    Impression & Recommendations:  Problem # 1:  CELLULITIS/ABSCESS, DIGIT NOS (ICD-681.9) Assessment: Improved cont septra and follow up as needed.  Orders: [facility]- Est Level  3 (16109)   Problem # 2:  KNEE PAIN (ICD-719.46) Assessment: Unchanged ketoprofen 20%gel AAA qid. 30g tube #3refills.  avoid by mouth NSAIDs.  abductor exercises along with back extension and knee to chest/shoulder.  inserts given for pes planus, consider orthotics at follow up.   Orders: [facility]- Est Level  3 (99213) Metatarsal pads- [facility] (U0454)   Problem # 3:  BACK PAIN, LUMBAR (ICD-724.2) h/o DDD.  low back exercises given for strengthening and tx of sciatic pain.  patient voices understanding.  follow up as needed.  Orders: Metatarsal pads- [facility] [phone])    Patient Instructions: 1)  Please schedule a follow-up appointment in 6 weeks.  Keep soaking your finger if it is sore.  Take 2 tabs of septra twice a day for 10 total days.  If the red color returns to your finger, let us know.  Continue knee to chest and knee to shoulder exercises along with yoga back exercises. Use ketoprofen gel 20%- 4 times a day on your knee when you have pain.

## 2010-05-30 NOTE — Progress Notes (Signed)
Summary: phn msg  Phone Note Call from Patient Call back at Home Phone [phone]   Caller: Patient Summary of Call: is requesting to speak with Dr Mauricio Po about his condition- he is in [place] right now Initial call taken by: De Nurse,  Sep 10, 2009 8:49 AM    Called patient and discussed R buttock pain; still with shooting pain, better with sitting, worse initially with walking but better after several blocks. In [place] now, plans to drive on to [place] to visit some friends.  Plan to increase gabapentin to 300mg  three times a day and continue exercises per Dr Darrick Penna.  Discussed intention for Percocet to be a bridge medication to maximal benefit with gabapentin, not a long-term solution.  Paula Compton MD  Sep 10, 2009 1:30 PM  Appended Document: phn msg I spoke to him as well and advised to cont meds and exercises.  also OK to try massage; use a pillow for driving.

## 2010-05-30 NOTE — Assessment & Plan Note (Signed)
Summary: LOWER BACK PAIN/JW   Vital Signs:  Patient Profile:   76 Years Old Male Height:     68.5 inches Pulse rate:   71 / minute BP sitting:   161 / 82  Vitals Entered By: Lillia Pauls CMA (February 08, 2008 11:25 AM)                 PCP:  [name] MD  Chief Complaint:  LOW BACK PAIN X 2 WKS.  History of Present Illness: Low back pain x 2 weeks. Initially developed some mild tightness which resolved with stretching.  One day he reached over to pick something up without bending his knees and developed a brief period of 9/10 sharp low back pain. Symptoms resolved with heat and tylenol.  He has had some continued symptoms with golfing.  He denies any lower extremity numbness, tingling, or weakness.  No radiation of the pain. No fevers/chills, bowel/bladder incontinence, or saddle anesthesia.    Hx or previous degenerative disc disease treated with exercise program.  Also 5 months s/p L TKR, doing 15 pound wt for knee extension and 30 lb wt for knee flexion exercises. Still with some swelling.    Current Allergies: No known allergies    Social History:    Reviewed history from 10/07/2007 and no changes required:       professor at [employer];        avid golfer;        no ETOH or tobacco       commutes to work from [employer]     Physical Exam  General:     Well-developed,well-nourished,in no acute distress; alert,appropriate and cooperative throughout examination Msk:     L knee with full extension. Still with 1+ effusion. Very good hamstring and quad strength with minimal quad atrophy Pulses:     2+ dp pulses Neurologic:     sensation in lower extremity intact to light touch Skin:     no skin lesions or rashes   Detailed Back/Spine Exam  Gait:    Normal heel-toe gait pattern bilaterally.    Lumbosacral Exam:  Inspection-deformity:    Normal Palpation-spinal tenderness:  Normal Range of Motion:    Forward Flexion:   85 degrees    Hyperextension:   15  degrees    Right Lateral Bend:   20 degrees    Left Lateral Bend:   20 degrees Squatting:  normal Lying Straight Leg Raise:    Right:  negative    Left:  negative Sitting Straight Leg Raise:    Right:  negative    Left:  negative Reverse Straight Leg Raise:    Right:  negative    Left:  negative Contralateral Straight Leg Raise:    Right:  negative    Left:  negative Sciatic Notch:    There is no sciatic notch tenderness. Toe Walking:    Right:  normal    Left:  normal Heel Walking:    Right:  normal    Left:  normal Patrick's Maneuver:    Right:  negative    Left:  negative Fabere Test:    Right:  negative    Left:  negative     some pain with extremes of flexion, extension, rotation, and lateral bend    Impression & Recommendations:  Problem # 1:  BACK PAIN, LUMBAR (ICD-724.2) Assessment: Deteriorated Mechanical low back pain. No red flag signs/sx or signs/sx of herniated disc.   Will start tramadol for  pain, and diazepam at night for muscle relaxant. Advised to continue home exercise program for back exercises, start walking program.  He will hold off on golfing for 2 weeks and then gradually ease into return to play.  Follow up if not improving in 2-3 weeks. His updated medication list for this problem includes:    Tramadol Hcl 50 Mg Tabs (Tramadol hcl) .Marland Kitchen... 1 by mouth q 6 hrs as needed   Complete Medication List: 1)  Avodart 0.5 Mg Caps (Dutasteride) 2)  Doxazosin Mesylate 8 Mg Tabs (Doxazosin mesylate) 3)  Felodipine 10 Mg Tb24 (Felodipine) 4)  Simvastatin 40 Mg Tabs (Simvastatin) 5)  Lisinopril 10 Mg Tabs (Lisinopril) .Marland Kitchen.. 1 by mouth daily 6)  Colchicine 0.6 Mg Tabs (Colchicine) .Marland Kitchen.. 1 by mouth bid 7)  Lisinopril-hydrochlorothiazide 10-12.5 Mg Tabs (Lisinopril-hydrochlorothiazide) .... Take one tablet daily 8)  Diazepam 5 Mg Tabs (Diazepam) .Marland Kitchen.. 1 by mouth at bedtime as needed 9)  Tramadol Hcl 50 Mg Tabs (Tramadol hcl) .Marland Kitchen.. 1 by mouth q 6 hrs as  needed   Patient Instructions: 1)  take diazepam at night to help back relax at night for atleast 1 week, then as needed. If this is making you too drowsy, you can take 1/2 tablet. 2)  take tramadol as needed for pain (atleast 2 a day for first week, up to 4x a day if needed) 3)  continue back exercise program 4)  start regular walking program 5)  hold off on golfing for 2 weeks, then ease into return to playing 6)  Follow up as needed 7)  follow up with dr. Fayrene Fearing breen for general medical care   Prescriptions: TRAMADOL HCL 50 MG  TABS (TRAMADOL HCL) 1 by mouth q 6 hrs as needed  #30 x 1   Entered by:   Benn Moulder MD   Authorized by:   [name] MD   Signed by:   Benn Moulder MD on 02/08/2008   Method used:   Print then Give to Patient   RxID:   [id number] DIAZEPAM 5 MG TABS (DIAZEPAM) 1 by mouth at bedtime as needed  #30 x 0   Entered by:   Benn Moulder MD   Authorized by:   [name] MD   Signed by:   Benn Moulder MD on 02/08/2008   Method used:   Print then Give to Patient   RxID:   [id number] TRAMADOL HCL 50 MG  TABS (TRAMADOL HCL) 1 by mouth q 6 hrs as needed  #30 x 1   Entered by:   Benn Moulder MD   Authorized by:   [name] MD   Signed by:   Benn Moulder MD on 02/08/2008   Method used:   Electronically to        [facility] [phone]* (retail)       [address] [place], [place]  [address]       Ph: [phone]       Fax: [phone]   RxID:   [id number] DIAZEPAM 5 MG TABS (DIAZEPAM) 1 by mouth at bedtime as needed  #30 x 0   Entered by:   Benn Moulder MD   Authorized by:   [name] MD   Signed by:   Benn Moulder MD on 02/08/2008   Method used:   Print then Give to Patient   RxID:   [id number]  ]

## 2010-05-30 NOTE — Progress Notes (Signed)
Summary: Referral  Phone Note Call from Patient Call back at Home Phone [phone]   Reason for Call: Referral Summary of Call: Is requesting to speak with Dr. Darrick Penna about seeing another urologist, as his has left [place]. Initial call taken by: Haydee Salter,  January 26, 2008 8:33 AM    I left message that he can switch to dr Isabel Caprice or nesi if his urologist is moving and he wants to stay with same group.

## 2010-05-30 NOTE — Letter (Signed)
Summary: Generic Letter  [name]  [address]   [place], [place] [address]   Phone: [phone]  Fax: [phone]    09/07/2009  [name] [address] [place], [place]  [address]  Dear [name],   It was a pleasure to see you in the office earlier this week.  I write with good news; both your LDL "bad" cholesterol and your kidney function are improved from the prior values.  I enclose a copy of the labs along with this letter for your records.   Please feel free to be in touch with any questions or concerns.      Sincerely,   Paula Compton MD  Appended Document: Generic Letter mailed with labs.  Appended Document: Generic Letter I called over weekend. worse radicular pain after traveling.  I left a message that he should seek medical evaluation as this trip may have worsened his sxs with radiculopathy and prob spinal stenosis.  Called back today to ck but no answer yet.  KBF

## 2010-05-30 NOTE — Letter (Signed)
Summary: [facility] Referral form  [facility] Referral form   Imported By: Marily Memos 10/02/2009 09:01:35  _____________________________________________________________________  External Attachment:    Type:   Image     Comment:   External Document

## 2010-05-30 NOTE — Letter (Signed)
Summary: *Referral Letter  [facility]  [address]   [place], [place] [address]   Phone: [phone]  Fax: [phone]    05/05/2007 Trudee Grip, MD [facility]  Dear [name]:  Thank you in advance for agreeing to see my patient:  [name] [address] 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  He would like a second opinion about his left knee.  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  Dr. Thurston Hole advised him to consider knee replacement.  Currently he is having less pain and would like a second opinion about whether to proceed with surgical intervention at this point or not.  Procedures Requested: consideration for knee replacement left  Current Medical Problems: 1)  OSTEOARTHRITIS, KNEE, LEFT (ICD-715.96) 2)  BAKER'S CYST, LEFT KNEE (ICD-727.51) 3)  ENTHESOPATHY, WRIST/CARPUS (ICD-726.4) 4)  CONTUSION, RIGHT HAND (ICD-923.20) 5)  HYPOKALEMIA (ICD-276.8) 6)  DIARRHEA, PRESUMED INFECTIOUS ORIGIN (ICD-009.3) 7)  SCIATICA, RIGHT (ICD-724.3) 8)  HYPERTENSION, BENIGN SYSTEMIC (ICD-401.1) 9)  HYPERLIPIDEMIA (ICD-272.4) 10)  ESOPHAGITIS, UNSPECIFIED (ICD-530.10) 11)  ERECTILE DYSFUNCTION (ICD-302.74) 12)  BPH (ICD-600) 13)  BACK PAIN, LOW (ICD-724.2) 14)  ASTHMA, UNSPECIFIED (ICD-493.90) 15)  BACK PAIN, LUMBAR (ICD-724.2) 16)  DEGENERATIVE DISC DISEASE, LUMBAR SPINE (ICD-722.52) 17)  KNEE PAIN (ICD-719.46) 18)  CELLULITIS/ABSCESS, DIGIT NOS (ICD-681.9)   Current Medications: 1)  AVODART 0.5 MG CAPS (DUTASTERIDE)  2)  DOXAZOSIN MESYLATE 8 MG TABS (DOXAZOSIN MESYLATE)  3)  FELODIPINE 10 MG TB24 (FELODIPINE)  4)  LISINOPRIL-HYDROCHLOROTHIAZIDE 10-12.5 MG TABS (LISINOPRIL-HYDROCHLOROTHIAZIDE) Take 1 tablet by mouth once a day 5)  SIMVASTATIN 40 MG TABS (SIMVASTATIN)    Past Medical History: 1)  cardiac cath  1978 - normal, creatinine 1.6 in 2006, etoh - quit 6 yrs ago, hemorrhoids 2000, smoked for 10 yrs quit 1974, tinea cruris  Thank you for agreeing to see our patient; please contact us if you have any further questions or need additional information.  Sincerely,   Vincent Gros MD

## 2010-05-30 NOTE — Progress Notes (Signed)
Summary: Triage  Phone Note Call from Patient Call back at Home Phone [phone]   Summary of Call: Pt is requesting to speak with a nurse about his calf being sore. Initial call taken by: Haydee Salter,  Aug 31, 2007 4:41 PM  Follow-up for Phone Call        pt states he has been notified that there are no blood clots in his leg. but he wants to know what he can do about the pain he is having in his calf. will send message to Dr. Darrick Penna. Follow-up by: Theresia Lo RN,  Aug 31, 2007 4:52 PM    I called patient.

## 2010-05-30 NOTE — Assessment & Plan Note (Signed)
Summary: f/u reflux,df   Vital Signs:  Patient profile:   76 year old male Height:      68.5 inches Weight:      204 pounds BMI:     30.68 Temp:     98.1 degrees F oral Pulse rate:   70 / minute BP sitting:   130 / 78  (left arm) Cuff size:   large  Vitals Entered By: Tessie Fass CMA (May 09, 2009 12:10 PM) CC: F/U reflux Is Patient Diabetic? No Pain Assessment Patient in pain? no        Primary Care Provider:  Paula Compton MD  CC:  F/U reflux.  History of Present Illness: Patient seen today for follow up of two abdominal complaints.    1) Seen in the past for LUQ pain, intermittent, not positional.  Thought it was gas pain.  No change with bowel movement of flatus.  Still persists.   2) Has started to have a recurrence of reflux symptoms, which have resurfaced in the past couple of months after a 14 yr hiatus.  Noticed the return of GERD after starting back drinking a glass of wine with dinner on most days, also resumed drinking a cup of coffee a day.  No other dietary changes.  Has been taking omeprazole 40mg  daily since November, with only mild relief.  Reports feeling more bloated than before.  Denies hematochezia or melena.  Does report changes in stool color, including orange-colored stool (does not explicitly report pale stool).  Colon cancer screening reviewed with patient, last colonoscopy for screening done in 2005.   Family Hx significant for sister with pancreatic cancer (she did not drink or smoke).  Habits & Providers  Alcohol-Tobacco-Diet     Tobacco Status: quit  Allergies: No Known Drug Allergies  Physical Exam  General:  Awake and alert, no apparent distress. Eyes:  clear sclerae bilaterally.  Mouth:  moist mucus membranes.  Clear oropharynx.  Neck:  supple, no anterior cervical adenopathy. Lungs:  Normal respiratory effort, chest expands symmetrically. Lungs are clear to auscultation, no crackles or wheezes. Heart:  Normal rate and  regular rhythm. S1 and S2 normal without gallop, murmur, click, rub or other extra sounds. Abdomen:  soft, nontender, nondistended, no masses or megaly.  Audible normal bowel sounds.    Rectal exam: intact anal wink.  No masses or lesions with DRE.  brown stool, negative guaiac. Prostate:  no masses noted.   Impression & Recommendations:  Problem # 1:  GERD (ICD-530.81) Patient with refractory GERD symptoms despite use of PPI for over 6 weeks.  WIll refer for GI consideration. His updated medication list for this problem includes:    Omeprazole 40 Mg Cpdr (Omeprazole) .Marland Kitchen... Take one by mouth daily  Orders: Comp Met-[facility] [id number]) CBC-[facility] (65784) Lipase-[facility] ([id number]) Gastroenterology Referral (GI) Ultrasound (Ultrasound) [facility]- Est  Level 4 (32440)  Problem # 2:  ABDOMINAL PAIN, LEFT UPPER QUADRANT (ICD-789.02) Patient with continued LUQ pain, bloating.  Will order abd [place]; patient with history of mildly elevated Cr, will reassess in event a CT is ordered. Patient for GI consult as above.  Orders: Comp Met-[facility] [id number]) CBC-[facility] (40347) Lipase-[facility] [id number]) Gastroenterology Referral (GI) Ultrasound (Ultrasound) [facility]- Est  Level 4 (64332)  Complete Medication List: 1)  Avodart 0.5 Mg Caps (Dutasteride) 2)  Doxazosin Mesylate 8 Mg Tabs (Doxazosin mesylate) 3)  Simvastatin 40 Mg Tabs (Simvastatin) .... Sig: take 1 tab by mouth once daily 4)  Felodipine 10 Mg Xr24h-tab (Felodipine) .... Sig:  take 1 tablet by mouth daily 5)  Polyethylene Glycol 3350 Powd (Polyethylene glycol 3350) .... Sig: 17g (heaping teaspoon) dissolved in 8 oz water, drink once daily disp: 1 cannister 6)  Levitra 20 Mg Tabs (Vardenafil hcl) .... Take 1/2 to 1 tablet by mouth at bedtime as needed 7)  Omeprazole 40 Mg Cpdr (Omeprazole) .... Take one by mouth daily 8)  Lisinopril 10 Mg Tabs (Lisinopril) .... Once daily 9)  Multivitamins Caps (Multiple vitamin) 10)  Bayer Childrens Aspirin 81 Mg Chew  (Aspirin)  Patient Instructions: 1)  It was a pleasure to see you today.  2)  For the bloating and abdominal discomfort, I have ordered an abdominal ultrasound. I am also ordering some labs today. 3)   I would also like you to see a Gastroenterologist for consideration of a upper endoscopy as workup for the reflux that has not gone away completely with the omeprazole.  4)  I recommend trying to hold off on all alcohol, and all coffee, to see if this eliminates the symptoms.  I would like to see you back after you have met with the gastroenterologist, or sooner if needed.    Prevention & Chronic Care Immunizations   Influenza vaccine: Fluvax Non-MCR  (05/09/2008)   Influenza vaccine due: 05/09/2009    Tetanus booster: 10/07/2007: given   Tetanus booster due: 10/06/2017    Pneumococcal vaccine: Not documented    H. zoster vaccine: Not documented  Colorectal Screening   Hemoccult: Not documented   Hemoccult due: Not Indicated    Colonoscopy: Done.  (01/27/2004)   Colonoscopy due: 01/26/2014  Other Screening   PSA: 3.68  (10/05/2007)   PSA due due: 10/04/2008   Smoking status: quit  (05/09/2009)  Lipids   Total Cholesterol: 173  (10/05/2007)   LDL: 106  (10/05/2007)   LDL Direct: Not documented   HDL: 54  (10/05/2007)   Triglycerides: 64  (10/05/2007)    SGOT (AST): 18  (10/05/2007)   SGPT (ALT): 20  (10/05/2007) CMP ordered    Alkaline phosphatase: 56  (10/05/2007)   Total bilirubin: 0.5  (10/05/2007)  Hypertension   Last Blood Pressure: 130 / 78  (05/09/2009)   Serum creatinine: 1.48  (05/09/2008)   Serum potassium 4.0  (05/09/2008) CMP ordered   Self-Management Support :    Hypertension self-management support: Not documented    Lipid self-management support: Not documented

## 2010-05-30 NOTE — Assessment & Plan Note (Signed)
Summary: fu wrist/jw   Vital Signs:  Patient profile:   76 year old male BP sitting:   128 / 78  Vitals Entered By: Lillia Pauls CMA (December 04, 2008 8:55 AM)  Primary Provider:  [name] MD   History of Present Illness: [name] returns for follow up of his left elbow pain no pain now with golf some pain in positions - like during sleep or reaching for something using tennis elbow strap over medial side has done a lot of forearm exercises and feels much stronger  Triceps tendonitis having no sxs  doing triceps work in better position with elbows close to body  S/P TKR left knee with minimal pain no real swelling and feels that quad strength good still gets tingling into foot and lat calf  Allergies: No Known Drug Allergies  Physical Exam  General:  Well-developed,well-nourished,in no acute distress; alert,appropriate and cooperative throughout examination Msk:  excellent strength in left forearm no weakneess to wrist ext/ flexion or pronation and supination no TTP triceps tendon non painful and strong  left knee with large midline ant scar excellent quad strength extension lags only 1 to 2 deg   Impression & Recommendations:  Problem # 1:  ELBOW PAIN, LEFT (ICD-719.42) continues to improve add golf specific exercises keep up some forearm exercise  Problem # 2:  CALCIFIC TENDINITIS (ICD-727.82) asymptomatic cont correct position for triceps strength  as needed RTC  Problem # 3:  S/P ARTHRP KNE CONDYLE&PLATU MEDIAL&LAT CMPRTS ([id number]) This has done really well; strength is excellent.  Complete Medication List: 1)  Avodart 0.5 Mg Caps (Dutasteride) 2)  Doxazosin Mesylate 8 Mg Tabs (Doxazosin mesylate) 3)  Felodipine 10 Mg Tb24 (Felodipine) 4)  Simvastatin 40 Mg Tabs (Simvastatin) .... Sig: take 1 tab by mouth once daily 5)  Lisinopril 10 Mg Tabs (Lisinopril) .Marland Kitchen.. 1 by mouth daily 6)  Colchicine 0.6 Mg Tabs (Colchicine) .Marland Kitchen.. 1 by mouth bid 7)   Lisinopril-hydrochlorothiazide 10-12.5 Mg Tabs (Lisinopril-hydrochlorothiazide) .... Take one tablet daily 8)  Diazepam 5 Mg Tabs (Diazepam) .Marland Kitchen.. 1 by mouth at bedtime as needed 9)  Tramadol Hcl 50 Mg Tabs (Tramadol hcl) .Marland Kitchen.. 1 by mouth q 6 hrs as needed 10)  Felodipine 10 Mg Xr24h-tab (Felodipine) .... Sig: take 1 tablet by mouth daily 11)  Polyethylene Glycol 3350 Powd (Polyethylene glycol 3350) .... Sig: 17g (heaping teaspoon) dissolved in 8 oz water, drink once daily disp: 1 cannister 12)  Voltaren 1 % Gel (Diclofenac sodium) .... Apply 1g to affected area four times daily disp: 80 g or largest 13)  Levitra 20 Mg Tabs (Vardenafil hcl) .... Take 1/2 to 1 tablet by mouth at bedtime as needed

## 2010-05-30 NOTE — Consult Note (Signed)
Summary: [facility]   Imported By: Clydell Hakim 06/12/2009 14:03:43  _____________________________________________________________________  External Attachment:    Type:   Image     Comment:   External Document

## 2010-05-31 NOTE — Consult Note (Signed)
Summary: [facility]   Imported By: Haydee Salter 03/30/2007 16:00:24  _____________________________________________________________________  External Attachment:    Type:   Image     Comment:   External Document

## 2010-05-31 NOTE — Consult Note (Signed)
Summary: [facility]  [facility]   Imported By: Denny Peon LEVAN 04/09/2007 10:09:55  _____________________________________________________________________  External Attachment:    Type:   Image     Comment:   External Document

## 2010-06-19 ENCOUNTER — Encounter (INDEPENDENT_AMBULATORY_CARE_PROVIDER_SITE_OTHER): Payer: Self-pay | Admitting: *Deleted

## 2010-06-20 ENCOUNTER — Other Ambulatory Visit: Payer: Self-pay | Admitting: Sports Medicine

## 2010-06-20 DIAGNOSIS — M5410 Radiculopathy, site unspecified: Secondary | ICD-10-CM

## 2010-06-20 DIAGNOSIS — M549 Dorsalgia, unspecified: Secondary | ICD-10-CM

## 2010-06-20 DIAGNOSIS — M5136 Other intervertebral disc degeneration, lumbar region: Secondary | ICD-10-CM

## 2010-06-25 NOTE — Miscellaneous (Signed)
Summary: INJECTION ORDERS  Clinical Lists Changes  Orders: Added new Referral order of Radiology Referral (Radiology) - Signed  Appended Document: INJECTION ORDERS FAXED ORDER TO [facility] TO [facility] APPT AND THEY WILL CONTACT us AND CALL PT WITH APPT TIME AND DATE. FAXED TO [phone]  Appended Document: INJECTION ORDERS

## 2010-06-26 ENCOUNTER — Ambulatory Visit
Admission: RE | Admit: 2010-06-26 | Discharge: 2010-06-26 | Disposition: A | Discharge status: Home / Self Care | Payer: Medicare Other | Source: Ambulatory Visit | Attending: Sports Medicine | Admitting: Sports Medicine

## 2010-06-26 DIAGNOSIS — M5136 Other intervertebral disc degeneration, lumbar region: Secondary | ICD-10-CM

## 2010-06-26 DIAGNOSIS — M549 Dorsalgia, unspecified: Secondary | ICD-10-CM

## 2010-06-26 DIAGNOSIS — M5410 Radiculopathy, site unspecified: Secondary | ICD-10-CM

## 2010-07-01 ENCOUNTER — Encounter: Payer: Self-pay | Admitting: Home Health Services

## 2010-07-08 LAB — BASIC METABOLIC PANEL
BUN: 18 mg/dL (ref 6–23)
CO2: 27 mEq/L (ref 19–32)
Calcium: 8.6 mg/dL (ref 8.4–10.5)
Chloride: 108 mEq/L (ref 96–112)
Creatinine, Ser: 1.42 mg/dL (ref 0.4–1.5)
GFR calc Af Amer: 59 mL/min — ABNORMAL LOW (ref >60)
GFR calc non Af Amer: 49 mL/min — ABNORMAL LOW (ref >60)
Glucose, Bld: 117 mg/dL — ABNORMAL HIGH (ref 70–99)
Potassium: 3.8 mEq/L (ref 3.5–5.1)
Sodium: 140 mEq/L (ref 135–145)

## 2010-07-08 LAB — CBC
HCT: 36 % — ABNORMAL LOW (ref 39.0–52.0)
Hemoglobin: 12.2 g/dL — ABNORMAL LOW (ref 13.0–17.0)
MCH: 28.2 pg (ref 26.0–34.0)
MCHC: 33.9 g/dL (ref 30.0–36.0)
MCV: 83.3 fL (ref 78.0–100.0)
Platelets: 122 10*3/uL — ABNORMAL LOW (ref 150–400)
RBC: 4.32 MIL/uL (ref 4.22–5.81)
RDW: 14.3 % (ref 11.5–15.5)
WBC: 10.3 10*3/uL (ref 4.0–10.5)

## 2010-07-08 LAB — DIFFERENTIAL
Basophils Absolute: 0 10*3/uL (ref 0.0–0.1)
Basophils Relative: 0 % (ref 0–1)
Eosinophils Absolute: 0 10*3/uL (ref 0.0–0.7)
Eosinophils Relative: 0 % (ref 0–5)
Lymphocytes Relative: 22 % (ref 12–46)
Lymphs Abs: 2.3 10*3/uL (ref 0.7–4.0)
Monocytes Absolute: 1.1 10*3/uL — ABNORMAL HIGH (ref 0.1–1.0)
Monocytes Relative: 11 % (ref 3–12)
Neutro Abs: 6.9 10*3/uL (ref 1.7–7.7)
Neutrophils Relative %: 67 % (ref 43–77)

## 2010-07-08 LAB — POCT I-STAT, CHEM 8
BUN: 25 mg/dL — ABNORMAL HIGH (ref 6–23)
Calcium, Ion: 1.23 mmol/L (ref 1.12–1.32)
Chloride: 107 mEq/L (ref 96–112)
Creatinine, Ser: 1.7 mg/dL — ABNORMAL HIGH (ref 0.4–1.5)
Glucose, Bld: 99 mg/dL (ref 70–99)
HCT: 43 % (ref 39.0–52.0)
Hemoglobin: 14.6 g/dL (ref 13.0–17.0)
Potassium: 3.7 mEq/L (ref 3.5–5.1)
Sodium: 144 mEq/L (ref 135–145)
TCO2: 28 mmol/L (ref 0–100)

## 2010-07-12 ENCOUNTER — Encounter: Payer: Self-pay | Admitting: *Deleted

## 2010-07-16 ENCOUNTER — Encounter: Payer: Self-pay | Admitting: Family Medicine

## 2010-07-16 ENCOUNTER — Ambulatory Visit (INDEPENDENT_AMBULATORY_CARE_PROVIDER_SITE_OTHER): Payer: Medicare Other | Admitting: Family Medicine

## 2010-07-16 VITALS — BP 124/80 | Ht 69.0 in | Wt 198.0 lb

## 2010-07-16 DIAGNOSIS — M25519 Pain in unspecified shoulder: Secondary | ICD-10-CM | POA: Insufficient documentation

## 2010-07-16 DIAGNOSIS — M25512 Pain in left shoulder: Secondary | ICD-10-CM

## 2010-07-16 NOTE — Miscellaneous (Signed)
Summary: INJECTION ORDER  Clinical Lists Changes  Orders: Added new Test order of Radiology other (Radiology Other) - Signed  Appended Document: INJECTION ORDER order faxed to danielle at [phone] at [facility]

## 2010-07-16 NOTE — Assessment & Plan Note (Signed)
Mild deltoid strain with subscap involvement and biceps tendon involvement Mild impingement, supraspinatus.  Overuse, may have had acute trauma. Less arm extension on swing.  >25 minutes spent in face to face time with patient, >50% spent in counselling or coordination of care  Shoulder anatomy was reviewed with the patient using an anatomy book. Rotator cuff strengthening and scapular stabilization exercises were reviewed with the patient.  Retraining shoulder mechanics and function was emphasized to the patient with rehab done at least 5-6 days a week.  The patient could benefit from formal PT to assist with scapular stabilization and RTC strengthening if continued sx F/u 4 weeks if not better

## 2010-07-16 NOTE — Progress Notes (Signed)
The patient noted above presents with shoulder pain that has been ongoing for 2 weeks.  there is no history of trauma or accident, but he did injure his shoulder, L, while playing golf. He has been playing every day and hitting upwards of 200 balls a day. Now with lateral shoulder pain. The patient denies neck pain or radicular symptoms. Denies dislocation, subluxation, separation of the shoulder. The patient does not complain of pain in the overhead plane.  Medications Tried: horse linament Ice or Heat: heat Tried PT: No  Prior shoulder Injury: No Prior surgery: No Prior fracture: distant clavicular fracture as a child  Handedness: R  REVIEW OF SYSTEMS  GEN: No fevers, chills. Nontoxic. Primarily MSK c/o today. MSK: Detailed in the HPI GI: tolerating PO intake without difficulty Neuro: No numbness, parasthesias, or tingling associated. Otherwise the pertinent positives of the ROS are noted above.    GEN: Well-developed,well-nourished,in no acute distress; alert,appropriate and cooperative throughout examination HEENT: Normocephalic and atraumatic without obvious abnormalities. Ears, externally no deformities PULM: Breathing comfortably in no respiratory distress EXT: No clubbing, cyanosis, or edema PSYCH: Normally interactive. Cooperative during the interview. Pleasant. Friendly and conversant. Not anxious or depressed appearing. Normal, full affect.  Shoulder: L Inspection: No muscle  wasting or winging Ecchymosis/edema: neg  AC joint, scapula, clavicle: NT Cervical spine: NT, full ROM Spurling's: neg Abduction: full, 5/5 - mild pain in the lateral deltoid Flexion: full, 5/5 IR, full, lift-off: 5/5, mild pain ER at neutral: full, 5/5 AC crossover and compression: neg Neer: neg Hawkins: mild pos Drop Test: neg Empty Can: neg Supraspinatus insertion: NT Bicipital groove: NT Speed's: pos Yergason's: neg Sulcus sign: neg Scapular dyskinesis: none C5-T1  intact Sensation intact Grip 5/5

## 2010-07-30 ENCOUNTER — Encounter: Payer: Self-pay | Admitting: Family Medicine

## 2010-07-30 ENCOUNTER — Ambulatory Visit (INDEPENDENT_AMBULATORY_CARE_PROVIDER_SITE_OTHER): Payer: Medicare Other | Admitting: Family Medicine

## 2010-07-30 VITALS — BP 129/80 | HR 73

## 2010-07-30 DIAGNOSIS — M25512 Pain in left shoulder: Secondary | ICD-10-CM

## 2010-07-30 DIAGNOSIS — M25519 Pain in unspecified shoulder: Secondary | ICD-10-CM

## 2010-07-30 NOTE — Progress Notes (Signed)
The patient noted above presents with shoulder pain that has been ongoing for 6 weeks   there is no history of trauma or accident, but he did injure his shoulder, L, while playing golf. He has been playing every day and hitting upwards of 200 balls a day - diminished this and has only played twice since last eval. Now with lateral shoulder pain.  The patient denies neck pain or radicular symptoms.  Denies dislocation, subluxation, separation of the shoulder.  The patient does complain of pain in the overhead plane. Pain with abduction  Medications Tried: horse linament  Ice or Heat: heat  Tried PT: HEP yes Prior shoulder Injury: No  Prior surgery: No  Prior fracture: distant clavicular fracture as a child  Handedness: R   REVIEW OF SYSTEMS  GEN: No fevers, chills. Nontoxic. Primarily MSK c/o today.  MSK: Detailed in the HPI  GI: tolerating PO intake without difficulty  Neuro: No numbness, parasthesias, or tingling associated.  Otherwise the pertinent positives of the ROS are noted above.   GEN: Well-developed,well-nourished,in no acute distress; alert,appropriate and cooperative throughout examination  HEENT: Normocephalic and atraumatic without obvious abnormalities. Ears, externally no deformities  PULM: Breathing comfortably in no respiratory distress  EXT: No clubbing, cyanosis, or edema  PSYCH: Normally interactive. Cooperative during the interview. Pleasant. Friendly and conversant. Not anxious or depressed appearing. Normal, full affect.   Shoulder: L  Inspection: No muscle wasting or winging  Ecchymosis/edema: neg  AC joint, scapula, clavicle: NT  Cervical spine: NT, full ROM  Spurling's: neg  Abduction: full, 5/5 - mild pain in the lateral deltoid  Flexion: full, 5/5  IR, full, lift-off: 5/5, mild pain  ER at neutral: full, 5/5  AC crossover and compression: neg  Neer: POS Hawkins: mild pos  Drop Test: neg  Empty Can: neg  Supraspinatus insertion: NT  Bicipital  groove: NT  Speed's: pos  Yergason's: neg  Sulcus sign: neg  Scapular dyskinesis: none  C5-T1 intact  Sensation intact  Grip 5/5   A/P: 1. Left shoulder pain with subacromial impingement, biceps tendonitis: Relatively mild, but with golf, he is symptomatic.  He is having epidural in 2 weeks Formal PT - suspect he may have been over exuberant in his rehab that he was doing bid Assist and guide with form, RTC str and scap stabilization If cont to do poorly, inj subac  Diagnostic Ultrasound Evaluation [employer] E, MSK ultrasound, MSK probe Anatomy scanned: L shoulder Indication: Pain Findings: tendon sheath on L biceps tendon with increased fluid, no RTC tear, mild bursitis, subacromial with minimal impingement and none at subcoracoid.

## 2010-07-31 ENCOUNTER — Encounter: Payer: Self-pay | Admitting: Home Health Services

## 2010-07-31 ENCOUNTER — Ambulatory Visit (INDEPENDENT_AMBULATORY_CARE_PROVIDER_SITE_OTHER): Payer: Medicare Other | Admitting: Home Health Services

## 2010-07-31 VITALS — BP 140/85 | HR 77 | Temp 98.4°F | Ht 70.0 in | Wt 195.0 lb

## 2010-07-31 DIAGNOSIS — Z0000 Encounter for general adult medical examination without abnormal findings: Secondary | ICD-10-CM

## 2010-07-31 NOTE — Patient Instructions (Addendum)
1. Continue to eat 4-5 vegetables a day. 2. Continue to work on reaching goal weight of 185 lbs. 3. Follow up with pharmacy about getting the shingles vaccine. 4. Continue working out 3-4 days a week (including yoga, golf, weights, and cardio exercises)

## 2010-07-31 NOTE — Progress Notes (Signed)
Patient here for annual wellness visit, patient reports: Risk Factors/Conditions needing evaluation or treatment: Patient does not have any risk factors that need evaluation. Home Safety: Patient lives by his self in 1 story home.  Patient reports having smoke detectors but does not have adaptive equipment in bathroom.  Other Information: Corrective lens: Patient wears corrective lens daily.  Patient reports seeing eye doctor every six months Dentures: Patient does not have dentures and reports seeing dentist bi-annually. Memory: Patient denies memory loss.   Balance max value patientvalue  Sitting balance 1 1  Arise 2 2  Attempts to arise 2 2  Immediate standing balance 2 2  Standing balance 1 1  Nudge 2 2  Eyes closed 1 1  360 degree turn 1 1  Sitting down 2 2   Gait max value patient value  Initiation of gait 1 1  Step length-left 1 1  Step length-right 1 1  Step height-left 1 1  Step height-right 1 1  Step symmetry 1 1  Step continuity 1 1  Path 2 2  Trunk 2 2  Walking stance 1 1   Balance/Gait Score: 26/26    Annual Wellness Visit Requirements Recorded Today In  Medical, family, social history Past Medical, Family, Social History Section  Current providers Care team  Current medications Medications  Wt, BP, Ht, BMI Vital signs  Hearing assessment (welcome visit) declined  Tobacco, alcohol, illicit drug use History  ADL Nurse Assessment  Depression Screening Nurse Assessment  Cognitive impairment/Mini Mental Status Nurse Assessment/ Flowsheet  Fall Risk Nurse Assessment  Home Safety Progress Note  End of Life Planning (welcome visit) Social Documentation  Medicare preventative services Progress Note  Risk factors/conditions needing evaluation/treatment Progress Note  Personalized health advice Patient Instructions, goals, letter  Diet & Exercise Social Documentation  Emergency Contact Social Documentation  Seat Belts Social Documentation  Sun  exposure/protection Social Documentation    Medicare Prevention Plan: Recommended patient get shingles vaccine.   Recommended Medicare Prevention Screenings Men over 51 Test For Frequency Date of Last- BOLD if needed  Colorectal Cancer 1-10 yrs 10/05  Prostate Cancer Never or yearly 2011  Aortic Aneurysm Once if 65-75 with hx of smoking recommended  Cholesterol 5 yrs 11/11  Diabetes yearly Non diabetic  HIV yearly declined  Influenza Shot yearly 11/11  Pneumonia Shot once 5/11  Zostavax Shot once suggested

## 2010-08-01 ENCOUNTER — Other Ambulatory Visit: Payer: Self-pay | Admitting: Sports Medicine

## 2010-08-01 DIAGNOSIS — M5410 Radiculopathy, site unspecified: Secondary | ICD-10-CM

## 2010-08-01 DIAGNOSIS — M549 Dorsalgia, unspecified: Secondary | ICD-10-CM

## 2010-08-01 DIAGNOSIS — M5136 Other intervertebral disc degeneration, lumbar region: Secondary | ICD-10-CM

## 2010-08-12 ENCOUNTER — Other Ambulatory Visit: Payer: Self-pay | Admitting: Sports Medicine

## 2010-08-12 ENCOUNTER — Encounter: Payer: Self-pay | Admitting: Home Health Services

## 2010-08-12 ENCOUNTER — Ambulatory Visit
Admission: RE | Admit: 2010-08-12 | Discharge: 2010-08-12 | Disposition: A | Discharge status: Home / Self Care | Payer: BC Managed Care – PPO | Source: Ambulatory Visit | Attending: Sports Medicine | Admitting: Sports Medicine

## 2010-08-12 DIAGNOSIS — M549 Dorsalgia, unspecified: Secondary | ICD-10-CM

## 2010-08-12 DIAGNOSIS — M5410 Radiculopathy, site unspecified: Secondary | ICD-10-CM

## 2010-08-12 DIAGNOSIS — M5136 Other intervertebral disc degeneration, lumbar region: Secondary | ICD-10-CM

## 2010-08-12 NOTE — Progress Notes (Signed)
I have reviewed this visit and discussed with Suzanne Lineberry and agree with her documentation  

## 2010-08-28 ENCOUNTER — Ambulatory Visit (HOSPITAL_BASED_OUTPATIENT_CLINIC_OR_DEPARTMENT_OTHER)
Admission: RE | Admit: 2010-08-28 | Discharge: 2010-08-28 | Disposition: A | Discharge status: Home / Self Care | Payer: BC Managed Care – PPO | Source: Ambulatory Visit | Attending: Urology | Admitting: Urology

## 2010-08-28 DIAGNOSIS — Z79899 Other long term (current) drug therapy: Secondary | ICD-10-CM | POA: Insufficient documentation

## 2010-08-28 DIAGNOSIS — Z01812 Encounter for preprocedural laboratory examination: Secondary | ICD-10-CM | POA: Insufficient documentation

## 2010-08-28 DIAGNOSIS — Z01810 Encounter for preprocedural cardiovascular examination: Secondary | ICD-10-CM | POA: Insufficient documentation

## 2010-08-28 DIAGNOSIS — N35919 Unspecified urethral stricture, male, unspecified site: Secondary | ICD-10-CM | POA: Insufficient documentation

## 2010-08-28 LAB — POCT I-STAT 4, (NA,K, GLUC, HGB,HCT)
Glucose, Bld: 95 mg/dL (ref 70–99)
HCT: 46 % (ref 39.0–52.0)
Hemoglobin: 15.6 g/dL (ref 13.0–17.0)
Potassium: 3.7 mEq/L (ref 3.5–5.1)
Sodium: 142 mEq/L (ref 135–145)

## 2010-09-03 ENCOUNTER — Ambulatory Visit (INDEPENDENT_AMBULATORY_CARE_PROVIDER_SITE_OTHER): Payer: BC Managed Care – PPO | Admitting: Family Medicine

## 2010-09-03 ENCOUNTER — Encounter: Payer: Self-pay | Admitting: Family Medicine

## 2010-09-03 DIAGNOSIS — M25819 Other specified joint disorders, unspecified shoulder: Secondary | ICD-10-CM

## 2010-09-03 DIAGNOSIS — M7542 Impingement syndrome of left shoulder: Secondary | ICD-10-CM

## 2010-09-03 DIAGNOSIS — M25519 Pain in unspecified shoulder: Secondary | ICD-10-CM

## 2010-09-03 DIAGNOSIS — M25512 Pain in left shoulder: Secondary | ICD-10-CM

## 2010-09-03 MED ORDER — NITROGLYCERIN 0.2 MG/HR TD PT24: MEDICATED_PATCH | TRANSDERMAL | Status: DC

## 2010-09-03 NOTE — Patient Instructions (Signed)
Recheck in 6 weeks. 

## 2010-09-03 NOTE — Progress Notes (Signed)
F/u L shoulder pain  Doing better - 10-20%. Some abduction impingement cont, decreased aggressiveness of PT. Doing standard PT RTC and scapular stabilization program. Getting stronger.   Prior OV: there is no history of trauma or accident, but he did injure his shoulder, L, while playing golf. He has been playing every day and hitting upwards of 200 balls a day - diminished this and has only played twice since last eval. Now with lateral shoulder pain.  The patient denies neck pain or radicular symptoms.  Denies dislocation, subluxation, separation of the shoulder.  The patient does complain of pain in the overhead plane. Pain with abduction  Medications Tried: horse linament  Ice or Heat: heat  Tried PT: HEP yes Prior shoulder Injury: No  Prior surgery: No  Prior fracture: distant clavicular fracture as a child  Handedness: R   REVIEW OF SYSTEMS  GEN: No fevers, chills. Nontoxic. Primarily MSK c/o today.  MSK: Detailed in the HPI  GI: tolerating PO intake without difficulty  Neuro: No numbness, parasthesias, or tingling associated.  Otherwise the pertinent positives of the ROS are noted above.   GEN: Well-developed,well-nourished,in no acute distress; alert,appropriate and cooperative throughout examination  HEENT: Normocephalic and atraumatic without obvious abnormalities. Ears, externally no deformities  PULM: Breathing comfortably in no respiratory distress  EXT: No clubbing, cyanosis, or edema  PSYCH: Normally interactive. Cooperative during the interview. Pleasant. Friendly and conversant. Not anxious or depressed appearing. Normal, full affect.   Shoulder: L  Inspection: No muscle wasting or winging  Ecchymosis/edema: neg  AC joint, scapula, clavicle: NT  Cervical spine: NT, full ROM  Spurling's: neg  Abduction: full, 5/5 - mild pain in the lateral deltoid  Flexion: full, 5/5  IR, full, lift-off: 5/5, mild pain  ER at neutral: full, 5/5  AC crossover and compression:  neg  Neer: POS Hawkins: mild pos  Drop Test: neg  Empty Can: neg  Supraspinatus insertion: NT  Bicipital groove: NT  Speed's: pos  Yergason's: neg  Sulcus sign: neg  Scapular dyskinesis: none  C5-T1 intact  Sensation intact  Grip 5/5   A/P: 1. Left shoulder pain with subacromial impingement, biceps tendonitis: Relatively mild, but with golf, he is symptomatic. Improving  Cont formal PT  Add NTG patch  SubAC Injection, L Verbal consent was obtained from the patient. Risks, benefits, and alternatives were explained. Patient prepped with Betadine and Ethyl Chloride used for anesthesia. The subacromial space was injected using the posterior approach. The patient tolerated the procedure well and had decreased pain post injection. No complications. Injection: 9 cc of Lidocaine 1% and 1cc of Kenalog 40 mg. Needle: 22 gauge

## 2010-09-04 NOTE — Op Note (Addendum)
  NAME:  Paul [name], WAGSTER NO.:  [id number]  MEDICAL RECORD NO.:  [id number]          PATIENT TYPE:  AMB  LOCATION:  [facility]                         FACILITY:  [facility]  PHYSICIAN:  Valetta Fuller, M.D.  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  POSTOPERATIVE DIAGNOSIS:  Urethral stricture status post transurethral resection of the prostate.  PROCEDURE PERFORMED:  Cystoscopy with balloon dilation of urethral stricture.  SURGEON:  Valetta Fuller, M.D.  ANESTHESIA:  General.  INDICATIONS:  [name] is 76 years of age.  The patient underwent a Gyrus TURP in December 2011 for progressive voiding symptoms and documented obstruction based on pressure flow studies.  The patient had excellent improvement in his overall urinary stream, but then came in recently complaining of a weaker stream.  Cystoscopic assessment in the office appeared to reveal a very tight, but what appeared to be short bulbar urethral stricture.  We suggested balloon dilation in the operating room.  We discussed the issues with regard to urethral stricture and the recurrence issues.  We felt that he would benefit from the procedure.  Full informed consent was obtained.  TECHNIQUE AND FINDINGS:  The patient was brought to the operating room where he had successful induction of general anesthesia.  He was placed in the lithotomy position and prepped and draped in the usual manner. He received perioperative ciprofloxacin and appropriate surgical time- out was performed.  Cystoscopy again revealed a fairly tight 5 to 6- [place] urethral stricture.  A guidewire was placed through the stricture and with fluoroscopic guidance, this was noted to be in the bladder.  A 24-French fascial dilating balloon was then placed over the wire  without difficulty and positioned.  This was then inflated for 5 minutes to approximately 12-14 atmospheres of pressure.  The balloon was removed and cystoscopy was then performed.  The area of the stricture opened up extremely nicely and there was no evidence of obvious trauma.  The stricture itself appeared to be quite short, less than 5 mm in size. The prostatic urethra was otherwise well-resected and the bladder neck was wide open.  No other bladder pathology was appreciated.  Over the guidewire we placed an 21- French council-tip Foley catheter, which will be left indwelling for approximately 48 hours.  No complications occurred and the patient was brought to the recovery room in stable condition.     Valetta Fuller, M.D.     DSG/MEDQ  D:  08/28/2010  T:  08/28/2010  Job:  [id number]  Electronically Signed by Barron Alvine M.D. on 09/10/2010 07:00:49 PM

## 2010-09-10 NOTE — H&P (Signed)
NAME:  Paul [name], Paul [name] NO.:  [id number]   MEDICAL RECORD NO.:  [id number]          PATIENT TYPE:  INP   LOCATION:  NA                           FACILITY:  [facility]   PHYSICIAN:  Ollen Gross, M.D.    DATE OF BIRTH:  07/04/1934   DATE OF ADMISSION:  09/13/2007  DATE OF DISCHARGE:                              HISTORY & PHYSICAL   DATE OF OFFICE VISIT HISTORY AND PHYSICAL:  Performed on Sep 02, 2007.   CHIEF COMPLAINT:  Left knee pain.   HISTORY OF PRESENT ILLNESS:  The patient is 76 year old male who has  been seen by Dr. Lequita Halt for ongoing knee pain.  He has known arthritis  and has had recurrent effusions.  He has undergone aspirations and  injections in the past.  Cortisone has only provided short-term relief.  He has had a progressive-type spontaneous osteonecrosis of the knee  which has increased and caused severe pain.  It is felt the most  predictable way of improving his pain and function would be a knee  replacement.  Risks and benefits have been discussed.  He elects to  proceed with surgery.  He has been seen preoperatively by Dr. Darrick Penna and  felt to be stable for upcoming surgery.   ALLERGIES:  No known drug allergies.   CURRENT MEDICATIONS:  Lisinopril, felodipine, Detrol, simvastatin,  doxazosin, Avodart, baby Bayer aspirin 81 mg, glucosamine sulfate and  multivitamin.   PAST MEDICAL HISTORY:  1. Cataracts.  2. Hypertension.  3. Hiatal hernia.  4. Hemorrhoids.  5. Enlarged prostate.  6. Urinary incontinence.  7. Arthritis.   PAST SURGICAL HISTORY:  He had a cardiac catheterization about 30 years  ago which was told to him that it was normal.   FAMILY HISTORY:  Father with history of kidney disease.  Mother with  history of diabetes and cancer runs in the family.   SOCIAL HISTORY:  Separated, works as a Oceanographer.  Past  smoker.  No alcohol.  Lives alone.  Would like to look into a skilled  rehab facility.   REVIEW OF SYSTEMS:   GENERAL:  No fevers, chills, night sweats.  NEURO:  No seizures, syncope, paralysis.  RESPIRATORY:  No shortness of breath,  productive cough or hemoptysis.  CARDIOVASCULAR:  No chest pain, angina,  orthopnea.  GI:  No nausea, vomiting, diarrhea, or constipation.  GU:  No dysuria, hematuria, discharge.  He does have a little bit of nocturia  and slight urinary incontinence. MUSCULOSKELETAL:  Left knee.   PHYSICAL EXAMINATION:  VITAL SIGNS:  Pulse 64, respirations 12, blood  pressure 138/74.  GENERAL:  The patient is a 76 year old male well-nourished, well-  developed, no acute distress, alert and oriented and cooperative.  He is  a good historian.  HEENT:  Normocephalic, atraumatic.  Pupils are round, reactive.  EOMs  intact.  NECK:  Supple.  CHEST:  Clear anterior and posterior chest walls.  No rhonchi, rales or  wheezing.  HEART:  Regular rate and rhythm.  No murmur.  S1, S2 is noted.  ABDOMEN:  Soft, nontender.  Bowel sounds present.  RECTAL, BREAST, GENITALIA:  Not done, not pertinent to present illness.  EXTREMITIES:  Left knee:  He is tender laterally, no instability,  moderate crepitus is noted, does have a small effusion.  Range of motion  5-120.   IMPRESSION:  1. Osteoarthritis/avascular necrosis left knee.  2. Hypertension.  3. Cataracts.  4. Hiatal hernia.  5. Hemorrhoids.  6. Benign prostatic hypertrophy.  7. Urinary incontinence.   PLAN:  The patient will be admitted to [facility], undergo a  left total knee replacement arthroplasty.  Surgery will be performed by  Dr. Ollen Gross.      Alexzandrew L. Perkins, P.A.C.      Ollen Gross, M.D.  Electronically Signed    ALP/MEDQ  D:  09/12/2007  T:  09/12/2007  Job:  [id number]   cc:   Sibyl Parr. Darrick Penna, M.D.   [employer], M.D.  Fax: [phone]

## 2010-09-10 NOTE — Op Note (Signed)
  NAMETJ, KITCHINGS NO.:  [id number]  MEDICAL RECORD NO.:  [id number]          PATIENT TYPE:  AMB  LOCATION:  [facility]                         FACILITY:  [facility]  PHYSICIAN:  Valetta Fuller, M.D.  DATE OF BIRTH:  1934/07/22  DATE OF PROCEDURE:  09/04/2010 DATE OF DISCHARGE:  04/16/2010                              OPERATIVE REPORT   PREOPERATIVE DIAGNOSIS:  Bulbar urethral stricture.  POSTOPERATIVE DIAGNOSIS:  Bulbar urethral stricture.  PROCEDURE PERFORMED:  Cystoscopy with balloon dilation of urethral stricture.  SURGEON:  Valetta Fuller, M.D.  ANESTHESIA:  General.  INDICATIONS:  Paul [name] is currently 76 years of age.  Several months ago, he underwent TURP for progressive voiding symptoms with excellent improvement.  He recently complained of a reduction in the force of his urinary stream.  Reassessment in our office revealed what appeared to be a short but somewhat tight bulbar urethral stricture.  He presents now for further assessment and treatment of that.  TECHNIQUE AND FINDINGS:  The patient was brought to the operating room where he had successful induction of general anesthesia.  He was placed in lithotomy position and prepped and draped in the usual manner.  He received perioperative ciprofloxacin.  Appropriate surgical time-out was performed.  Rigid cystoscopy revealed what appeared to be a tight 5- to 6-French bulbar urethral stricture that appeared to be short in length. With fluoroscopic guidance, a guidewire was placed in the bladder.  A fascial dilating balloon was utilized which was 24 [place] to approximately 10 atmospheres of pressure for 5 minutes.  The balloon was then deflated.  The stricture was reassessed and had opened up beautifully.  There was no evidence of bleeding and what appeared to be minimal trauma.  The area involved appeared to be less than 5 mm in length.  Over the guidewire, a Council-tip Foley was placed  which drained clear urine.  The patient was brought to recovery room in stable condition having had no obvious complications or problems.     Valetta Fuller, M.D.     DSG/MEDQ  D:  09/04/2010  T:  09/04/2010  Job:  [id number]  Electronically Signed by Barron Alvine M.D. on 09/10/2010 07:00:53 PM

## 2010-09-10 NOTE — Op Note (Signed)
Paul [name], [name] NO.:  [id number]   MEDICAL RECORD NO.:  [id number]          PATIENT TYPE:  INP   LOCATION:  0098                         FACILITY:  [facility]   PHYSICIAN:  Ollen Gross, M.D.    DATE OF BIRTH:  1935-03-23   DATE OF PROCEDURE:  09/13/2007  DATE OF DISCHARGE:                               OPERATIVE REPORT   PREOPERATIVE DIAGNOSIS:  Osteonecrosis left knee.   POSTOPERATIVE DIAGNOSIS:  Osteonecrosis left knee.   PROCEDURE:  Left total knee arthroplasty.   SURGEON:  Ollen Gross, MD.   ASSISTANT:  Avel Peace, PA-C.   ANESTHESIA:  Spinal with Duramorph.   ESTIMATED BLOOD LOSS:  Minimal.   DRAIN:  Hemovac x1.   TOURNIQUET TIME:  35 minutes at 300 mmHg.   COMPLICATIONS:  None.   CONDITION:  Stable to recovery.   BRIEF CLINICAL NOTE:  Paul [name] is a 76 year old male with severe  osteonecrosis of the left knee with progressively worsening pain and  dysfunction which has been refractory to nonoperative management  including injections.  He presents now for left total knee arthroplasty.   PROCEDURE IN DETAIL:  After the successful administration of spinal  anesthetic, a tourniquet is placed high on the left thigh and the left  lower extremity prepped and draped in the usual sterile fashion.  Extremities wrapped in Esmarch, knee flexed and tourniquet inflated to  300 mmHg.  A midline incision is made with a 10-blade through  subcutaneous tissue to the level of the extensor mechanism.  A fresh  blade is used to make a medial parapatellar arthrotomy.  The soft tissue  over the proximal medial tibia is subperiosteally elevated at the joint  line with the knife and into the semimembranous bursa with a Cobb  elevator.  Soft tissue laterally is elevated with attention being paid  to avoiding the patellar tendon on the tibial tubercle.  Patella  subluxed laterally, knee flexed 90 degrees, ACL and PCL removed.  A  drill was used to create a starting  hole in the distal femur.  Canal was  thoroughly irrigated.  The 5 degree left valgus alignment guide is  placed and referencing with the posterior condyles rotation is marked in  a blocked pin to remove 11 mm of the distal femur.  I took 11 because of  a preop flexion contracture.  A distal femoral resection was made with  an oscillating saw.  A sizing block was placed, size 5 is most  appropriate.  Rotation is marked up the epicondylar axis.  A size 5  cutting block is placed and the anterior, posterior and chamfer cuts are  made.   Tibia subluxed forward and menisci removed.  Extramedullary tibial  alignment guide is placed, referencing proximally at the medial aspect  of the tibial tubercle and distally along the second metatarsal axis and  tibial press.  Block is pinned to remove 10 mm off the nondeficient  lateral side.  Tibial resection is made with an oscillating saw.  A size  4 was the most appropriate tibial component and the proximal tibia  was  prepared with the modular drill and a keel punch for the size 4.  Femoral preparation was completed with intercondylar cut for the size 5.   A size 4 mobile-bearing tibial trial, size 5 posterior stabilized  femoral trial and a 10 mm posterior stabilized rotating platform insert  trial are placed.  With the 10, he hyperextended a tiny bit so we went  to 12 which allowed for full extension with excellent varus, valgus,  anterior and posterior balance throughout for the range of motion.  The  patella then everted and thickness measured to be 23 mm.  Free hand  resection was taken to 14 mm, a 38 template was placed, lug holes were  drilled, a trial patella was placed and it tracks normally.  Osteophytes  were removed from the posterior femur with the trial in place.  All  trials were removed and the cut bony surfaces were prepared with  pulsatile lavage.  Cement is mixed and once ready for implantation a  size 4 mobile-bearing tibial  tray, size 5 posterior stabilized femur and  38 patella were cemented into place and patella was held with a clamp.  A trial 12.5 mm insert was placed, knee held in full extension and all  extruded cement removed.  When the cement was fully hardened then the  trial was removed and the wound copiously irrigated with saline  solution.  FloSeal was injected on the posterior capsule then a  permanent 12.5 mm posterior stabilized rotating platform insert is  placed into the tibial tray.  We then injected FloSeal into the medial  and lateral gutters and suprapatellar area.  A moist sponge is placed  and the tourniquet is released with a total time of 35 minutes.  The  sponge is held for 2 minutes then removed.  Minimal bleeding is  encountered.  That which is encountered is stopped with electrocautery.  Wound was again copiously irrigated and the extensor mechanism closed  over a Hemovac drain with interrupted #1 PDS.  Flexion against gravity  is 140 degrees.  The subcu is closed with interrupted #2-0 Vicryl and  with subcuticular running #4-0 Monocryl.  Drains hooked to suction,  incision cleaned and dried and Steri-Strips and a bulky sterile dressing  applied.  He is then placed into a knee immobilizer, awakened and  transported to recovery in stable condition.      Ollen Gross, M.D.  Electronically Signed     FA/MEDQ  D:  09/13/2007  T:  09/13/2007  Job:  [id number]   cc:   Alexzandrew L. Julien Girt, P.A.C.

## 2010-09-10 NOTE — Discharge Summary (Signed)
Paul [name], Paul [name] NO.:  [id number]   MEDICAL RECORD NO.:  [id number]          PATIENT TYPE:  INP   LOCATION:  1615                         [facility]ACILITY:  [facility]   PHYSICIAN:  Ollen Gross, M.D.    DATE OF BIRTH:  May 02, 1934   DATE OF ADMISSION:  09/13/2007  DATE OF DISCHARGE:  09/17/2007                               DISCHARGE SUMMARY   TENTATIVE DISCHARGE DATE:  Sep 17, 2007.   ADMITTING DIAGNOSES:  1. Osteoarthritis, avascular necrosis left knee.  2. Hypertension.  3. Cataracts.  4. Hiatal hernia.  5. Hemorrhoids.  6. Benign prostatic hypertrophy.  7. Urinary incontinence.   DISCHARGE DIAGNOSES:  1. Osteonecrosis left knee, status post left total knee replacement      arthroplasty.  2. Acute postoperative blood loss anemia.  3. Status post transfusion without sequelae.  4. Postoperative hyponatremia, improved.  5. Postoperative hypokalemia, improved.  .  6. Hypertension.  7. Cataracts.  8. Hiatal hernia.  9. Hemorrhoids.  10.Benign prostatic hypertrophy.  11.Urinary incontinence.   PROCEDURE:  On Sep 13, 2007, left total knee.   SURGEON:  Dr. Lequita Halt.   ASSISTANT:  Avel Peace PA-C.   ANESTHESIA:  Spinal anesthesia with Duramorph.   CONSULTS:  None.   BRIEF HISTORY:  [name] is a 76 year old male with severe osteonecrosis of  the left knee with progressive worsening pain and dysfunction that has  been refractory to nonoperative management, now presents for a total  knee arthroplasty.   LABORATORY DATA:  CBC on admission showed a hemoglobin of 12.6,  hematocrit 38.1, white count 5.6, platelets 150.  Chem panel showed  slightly elevated carbon dioxide of 33, otherwise Chem panel all within  normal limits.  PT/INR 13.0 and 1.0 with PTT of 33.  Preop UA negative.  Serial CBCs were followed.  Postop hemoglobin did drop down to 8.4,  given a unit of blood.  Post-transfusion, the hemoglobin went back up to  9.3.  Last noted hemoglobin and  hematocrit 9.3 and 27.2.  Serial  protimes were followed per Coumadin protocol.  Last noted PT/INR 19.5  and 1.6.  Serial BMETs were followed.  Sodium did drop down to 132, came  back up to 140.  Potassium dropped to 3.2, came up to 3.8 after  supplementation.   Chest x-ray on Sep 08, 2007, no active disease.   EKG on Sep 08, 2007, sinus bradycardia, early repolarization, probably  normal variant compared to an EKG of February 20, 2003, confirmed by Dr.  Lewayne Bunting.   HOSPITAL COURSE:  The patient admitted to [facility].  He  tolerated the procedure well and later transferred to the recovery room  and orthopedic floor.  Started on PCA and p.o. analgesic for pain  control.  Following surgery, he was given 24 hours of postop IV  antibiotics and started on Coumadin for DVT prophylaxis.  He did have  some pain through the night.  On the morning of day #1, doing a little  bit better.  He could already tell that his preop pain was better.  I  briefly  discussed the surgical findings with Dr. Lequita Halt.  Due to his  social and home situation, it was felt he would need to go to a skilled  rehab facility, not having anybody at home.  He was started back on his  home meds.  His pressure was a little low, so we held his lisinopril and  put certain medications on parameters.  Hemoglobin was 9.6.  We started  him on iron.  He started getting up out of bed with therapy by day #2.  Unfortunately, the hemoglobin was down a little bit to 8.4.  Due to the  drop in his hemoglobin it was felt he would benefit from undergoing  blood.  We gave him 1 unit of blood.  His hemoglobin came back up to  9.3.  He felt better.  He had good output.  His pressure was also a  little low.  We felt that was due to his acute blood loss anemia.  He  did get blood and his pressure was back up by postop day #3.  Dressing  change on day #2, incision looked good.  Day #3, he was progressing with  physical therapy, but  it was felt he still would need skilled facility  due to his situation, not having anybody at home.  He was not fully  independent.  We are waiting on insurance approval, but possibility of  him transferring out today.  He was seen on rounds, stable, felt to be  ready for transfer and arrangements being made for tentative discharge,  pending insurance approval.   DISCHARGE/PLAN:  1. Possible discharge to a skilled nurse facility today for continued      therapy.  2. Discharge diagnosis same as above.  3. Discharge meds.   CURRENT MEDICATIONS:  1. Coumadin protocol.  Please titrate the Coumadin level for a target      INR between 2.0 and 3.0.  He needs to be on Coumadin for 3 weeks      from the date of surgery on Sep 13, 2007.  2. Colace 100 mg p.o. b.i.d.  3. Zocor 40 mg p.o. nightly.  4. Lisinopril 10 mg daily.  5. Colchicine 0.6 p.o. b.i.d.  6. Doxazosin 8 mg p.o. nightly.  7. Detrol LA 4 mg p.o. daily.  8. Plendil 10 mg p.o. daily.  9. Nu-Iron 150 mg p.o. daily for 3 weeks from the date of surgery.      May discontinue the iron after 3 weeks.  10.Proscar 5 mg p.o. nightly.  11.Laxative of choice.  12.Enema of choice.  13.Percocet 5 mg 1-2 every 4 hours as needed for pain.  14.Tylenol 325 one or two ever 4-6 hours as needed for mild pain or      headache.  15.Robaxin 500 mg p.o. every 6-8 h., p.r.n. spasm.   DIET:  Low-sodium, heart-healthy diet   ACTIVITY:  He is weightbearing as tolerated left lower extremity.  PT/OT  for gait training, ambulation, ADLs, total knee protocol.  He may start  showering, however, do not submerge the incision under water.   FOLLOW-UP:  Needs to followed up with Dr. Lequita Halt in the office 2 weeks  from date of surgery.  Please contact the office at [phone], to help  arrange appointment for this patient.  Disposition is pending at this  time.   CONDITION ON DISCHARGE:  Improving.      Alexzandrew L. Perkins, P.A.C.      Ollen Gross, M.D.  Electronically Signed  ALP/MEDQ  D:  09/17/2007  T:  09/17/2007  Job:  [id number]

## 2010-09-12 ENCOUNTER — Other Ambulatory Visit: Payer: Self-pay | Admitting: Family Medicine

## 2010-09-12 NOTE — Telephone Encounter (Signed)
Refill request

## 2010-10-15 ENCOUNTER — Ambulatory Visit: Payer: BC Managed Care – PPO | Admitting: Family Medicine

## 2010-10-22 ENCOUNTER — Ambulatory Visit: Payer: BC Managed Care – PPO | Admitting: Family Medicine

## 2010-10-23 ENCOUNTER — Ambulatory Visit (INDEPENDENT_AMBULATORY_CARE_PROVIDER_SITE_OTHER): Payer: BC Managed Care – PPO | Admitting: Family Medicine

## 2010-10-23 ENCOUNTER — Ambulatory Visit: Payer: BC Managed Care – PPO | Admitting: Family Medicine

## 2010-10-23 ENCOUNTER — Encounter: Payer: Self-pay | Admitting: Family Medicine

## 2010-10-23 DIAGNOSIS — IMO0002 Reserved for concepts with insufficient information to code with codable children: Secondary | ICD-10-CM

## 2010-10-23 DIAGNOSIS — M999 Biomechanical lesion, unspecified: Secondary | ICD-10-CM

## 2010-10-23 NOTE — Patient Instructions (Signed)
Great to meet you. I think you do have some stenosis of your spinal cord per your MRI but you seem to be doing well I was able to manipulate some today and I think you will see some moderate improvement.   If you want come back and see me in 3-4 weeks.

## 2010-10-24 DIAGNOSIS — M999 Biomechanical lesion, unspecified: Secondary | ICD-10-CM | POA: Insufficient documentation

## 2010-10-24 NOTE — Assessment & Plan Note (Signed)
After verbal consent pt did have HVLA, muscle energy with marked improvement.  Gave side effects to look out for and if needed take tramadol.

## 2010-10-24 NOTE — Assessment & Plan Note (Addendum)
Pt has hx of sciatica with imaging findings of displaced fragment of disc and had mild to moderate spinal stenosis at that time.  Pt history and physical is concerning for spinal stenosis that is worsening.  Pt is not one who wants to take pills and at the present moment not open to the idea of any type of surgical intervention.  Pt has neurontin and tramadol at home if needed for the pain.  Suggested to pt that the other epidural shot is there when ready.  Pt did respond well to some manipulation and given exercises to help strengthen core and mechanics.  Pt appears to be doing well with adl's and still very active.  Discussed to pt would continue current care and if OMT does help to return and will continue to do what we can but likely at some point the stenosis will worsen.  Given red flags to look out for.  RTC in 3-4 weeks.

## 2010-10-24 NOTE — Progress Notes (Signed)
  Subjective:    Patient ID: Paul [name], male    DOB: [birth year], 76 y.o.   MRN: [id number]  HPI 76 yo male with hx of back pain related to sciatica and MRI findings in 6/11 with fragmented disc fragment in lateral recess of L5 coming in with evaluation of continued back pain.  Pt has had epidural injections for his back pain x 2 already and is holding out as long as he can for the next one.  Pt at this time as well does not like to take medicine so is not taking anything for pain.  Does not take NSAIDS due to CRI.   Pt describes the pain as bearable most of the day but if he walks for more than 20 minutes he start to have a lot of pain that can radiate down both legs left > right with soreness and cramping in his calfs and ankles.  Pt states that the pain can go away within minutes if he sits down or if he stretches with his leg up in the air.  Pt denies any bowel or bladder problems and this pain is not waking him up at night.  Pt still able to enjoy things that he loves such as golf, and as long as he always has a cart he does well, does not hurt much when he swings a golf club and says his game has not suffered.  Pt has been seen by Dr. Darrick Penna in the past for the same sort of complaint but could be potentially worsening.     Review of Systems Denies fever, chills, nausea vomiting abdominal pain, dysuria, chest pain, shortness of breath dyspnea  Past medical history, social, surgical and family history all reviewed.        Objective:   Physical Exam Gen: NAD, pt balance and gait appear to be fine get up and go test is great CV: RRR no murmur appreciated Pul: CTAB Abd: BS+, NT, ND no masses appreciated.  Ext: no edema 1+ pulses bilaterally 2+ DTR no clonus. Sensation intact MSK:  Pt does have some paraspinal tightness and tenderness of lumbar region mild tightness of pirformis muscle on right.  Neg SLT +FABER test or left Neg trendelenburg   OMT Findings: Cervical:none Thoracic: T5  rotated and side bent right Lumbar: L3-5 rotated and side bent left Sacrum: right on right         Assessment & Plan:

## 2010-10-24 NOTE — Assessment & Plan Note (Signed)
After verbal consent pt did have HVLA, with marked improvement.  RTC in 3 weeks for re-evaluation

## 2010-10-29 ENCOUNTER — Encounter: Payer: Self-pay | Admitting: Family Medicine

## 2010-10-29 ENCOUNTER — Ambulatory Visit (INDEPENDENT_AMBULATORY_CARE_PROVIDER_SITE_OTHER): Payer: Medicare Other | Admitting: Family Medicine

## 2010-10-29 DIAGNOSIS — M7542 Impingement syndrome of left shoulder: Secondary | ICD-10-CM

## 2010-10-29 DIAGNOSIS — M25819 Other specified joint disorders, unspecified shoulder: Secondary | ICD-10-CM

## 2010-10-29 DIAGNOSIS — M25512 Pain in left shoulder: Secondary | ICD-10-CM

## 2010-10-29 DIAGNOSIS — M25519 Pain in unspecified shoulder: Secondary | ICD-10-CM

## 2010-10-30 NOTE — Progress Notes (Signed)
F/u L shoulder pain  Doing much better, compliant with HEP. S/p subac inj last ov. Approx 90% better or so. Feels good, playing golf at least 9 holes a day  Prior OV #2 Doing better - 10-20%. Some abduction impingement cont, decreased aggressiveness of PT. Doing standard PT RTC and scapular stabilization program. Getting stronger.   Prior OV: there is no history of trauma or accident, but he did injure his shoulder, L, while playing golf. He has been playing every day and hitting upwards of 200 balls a day - diminished this and has only played twice since last eval. Now with lateral shoulder pain.  The patient denies neck pain or radicular symptoms.  Denies dislocation, subluxation, separation of the shoulder.  The patient does complain of pain in the overhead plane. Pain with abduction  Medications Tried: horse linament  Ice or Heat: heat  Tried PT: HEP yes Prior shoulder Injury: No  Prior surgery: No  Prior fracture: distant clavicular fracture as a child  Handedness: R   REVIEW OF SYSTEMS  GEN: No fevers, chills. Nontoxic. Primarily MSK c/o today.  MSK: Detailed in the HPI  GI: tolerating PO intake without difficulty  Neuro: No numbness, parasthesias, or tingling associated.  Otherwise the pertinent positives of the ROS are noted above.   PE Blood pressure 148/79, pulse 63.   GEN: Well-developed,well-nourished,in no acute distress; alert,appropriate and cooperative throughout examination  HEENT: Normocephalic and atraumatic without obvious abnormalities. Ears, externally no deformities  PULM: Breathing comfortably in no respiratory distress  EXT: No clubbing, cyanosis, or edema  PSYCH: Normally interactive. Cooperative during the interview. Pleasant. Friendly and conversant. Not anxious or depressed appearing. Normal, full affect.   Shoulder: L  Inspection: No muscle wasting or winging  Ecchymosis/edema: neg  AC joint, scapula, clavicle: NT  Cervical spine: NT, full ROM    Spurling's: neg  Abduction: full, 5/5, NT Flexion: full, 5/5  IR, full, lift-off: 5/5, NT ER at neutral: full, 5/5  AC crossover and compression: neg  Neer: NT Hawkins: NT  Drop Test: neg  Empty Can: neg  Supraspinatus insertion: NT  Bicipital groove: NT  Speed's: neg Yergason's: neg  Sulcus sign: neg  Scapular dyskinesis: none  C5-T1 intact  Sensation intact  Grip 5/5   A/P: 1. Left shoulder pain with subacromial impingement, biceps tendonitis: Dramatically improved. C/w hep maintenance 2-3 days a week B.  F/u prn

## 2010-11-08 ENCOUNTER — Other Ambulatory Visit: Payer: Self-pay | Admitting: Sports Medicine

## 2010-11-12 ENCOUNTER — Encounter: Payer: Self-pay | Admitting: Family Medicine

## 2010-11-12 ENCOUNTER — Ambulatory Visit (INDEPENDENT_AMBULATORY_CARE_PROVIDER_SITE_OTHER): Payer: BC Managed Care – PPO | Admitting: Family Medicine

## 2010-11-12 DIAGNOSIS — M999 Biomechanical lesion, unspecified: Secondary | ICD-10-CM

## 2010-11-12 DIAGNOSIS — M5137 Other intervertebral disc degeneration, lumbosacral region: Secondary | ICD-10-CM

## 2010-11-12 NOTE — Assessment & Plan Note (Signed)
Pt likely has spinal stenosis still doing well with the regimen will continue manipulation as long as it helps, pt gait is better and seems to be doing well.  Given exercises to keep hip flexors loose as well.

## 2010-11-12 NOTE — Progress Notes (Signed)
  Subjective:    Patient ID: Paul [name], male    DOB: [birth year], 76 y.o.   MRN: [id number]  Back Pain   76 yo male with hx of back pain related to sciatica and MRI findings in 6/11 with fragmented disc fragment in lateral recess of L5 coming in with evaluation of continued back pain with likely spinal stenosis. Pt had manipulation, states that over the coarse of time has been feeling better, pt is near his goal of walking 30 minutes and biking 15 minutes daily.     Pt has had epidural injections for his back pain x 2 already and is holding out as long as he can for the next one.  Does not take NSAIDS due to CRI.   Pt describes the pain as bearable most of the day but if he walks for more than 20 minutes he start to have a lot of pain that can radiate down both legs left > right with soreness and cramping in his calfs and ankles.  Pt states that the pain can go away within minutes if he sits down or if he stretches with his leg up in the air.  Pt denies any bowel or bladder problems and this pain is not waking him up at night. Pt is still golfing and doing activities of daily living.      Review of Systems  Musculoskeletal: Positive for back pain.   Denies fever, chills, nausea vomiting abdominal pain, dysuria, chest pain, shortness of breath dyspnea  Past medical history, social, surgical and family history all reviewed.        Objective:   Physical Exam  Gen: NAD, pt balance and gait appear to be fine get up and go test is great CV: RRR no murmur appreciated Pul: CTAB Abd: BS+, NT, ND no masses appreciated.  Ext: no edema 1+ pulses bilaterally 2+ DTR no clonus. Sensation intact MSK:  Pt does have some paraspinal tightness and tenderness of lumbar region mild tightness of pirformis muscle on right.  Neg SLT Neg trendelenburg   OMT Findings: Cervical:none Thoracic: T5 rotated and side bent right Lumbar: L3-5 rotated and side bent left Sacrum: right on right          Assessment & Plan:

## 2010-11-12 NOTE — Assessment & Plan Note (Signed)
After verbal consent pt did have HVLA, with marked improvement.  Gave side effects to look out for and can take tylenol, has neurontin but not taking it. Marland Kitchen

## 2010-11-15 ENCOUNTER — Other Ambulatory Visit: Payer: Self-pay | Admitting: *Deleted

## 2010-11-15 DIAGNOSIS — I10 Essential (primary) hypertension: Secondary | ICD-10-CM

## 2010-11-15 DIAGNOSIS — E785 Hyperlipidemia, unspecified: Secondary | ICD-10-CM

## 2010-11-15 NOTE — Progress Notes (Signed)
ETT scheduled for 12/04/10 at 1:30 pm at [facility]. Pt notified of appt info.

## 2010-11-21 ENCOUNTER — Other Ambulatory Visit: Payer: Self-pay | Admitting: *Deleted

## 2010-11-21 NOTE — Progress Notes (Signed)
Pt on levitra - this is contraindicated to take with NTG patch.  Pt states he is no longer on NTG, only used 1/4 patch 1 time. Advised per Dr. Darrick Penna it is ok for him to take levitra is long as he is not on NTG.

## 2010-12-04 ENCOUNTER — Ambulatory Visit: Payer: BC Managed Care – PPO | Admitting: Sports Medicine

## 2010-12-04 ENCOUNTER — Ambulatory Visit (HOSPITAL_COMMUNITY): Payer: BC Managed Care – PPO

## 2010-12-05 ENCOUNTER — Ambulatory Visit: Payer: BC Managed Care – PPO | Admitting: Family Medicine

## 2010-12-05 ENCOUNTER — Encounter: Payer: Self-pay | Admitting: *Deleted

## 2010-12-05 NOTE — Patient Instructions (Signed)
Pt scheduled for ETT on September 12th at 130 at [facility] in non-invasive cardiology.  Pt is aware.

## 2010-12-20 ENCOUNTER — Encounter: Payer: Self-pay | Admitting: Family Medicine

## 2010-12-20 ENCOUNTER — Ambulatory Visit (INDEPENDENT_AMBULATORY_CARE_PROVIDER_SITE_OTHER): Payer: BC Managed Care – PPO | Admitting: Family Medicine

## 2010-12-20 DIAGNOSIS — M7542 Impingement syndrome of left shoulder: Secondary | ICD-10-CM

## 2010-12-20 DIAGNOSIS — M25819 Other specified joint disorders, unspecified shoulder: Secondary | ICD-10-CM

## 2010-12-20 DIAGNOSIS — IMO0002 Reserved for concepts with insufficient information to code with codable children: Secondary | ICD-10-CM

## 2010-12-20 DIAGNOSIS — M999 Biomechanical lesion, unspecified: Secondary | ICD-10-CM

## 2010-12-20 NOTE — Progress Notes (Signed)
  Subjective:    Patient ID: Paul [name], male    DOB: [birth year], 76 y.o.   MRN: [id number]  Back Pain   76 yo male with hx of back pain related to sciatica and MRI findings in 6/11 with fragmented disc fragment in lateral recess of L5 coming in with evaluation of continued back pain with likely spinal stenosis. Pt is here 5 weeks since last visit.   Pt had manipulation, pt states was doing better but then had set back after a round of golf.  Pt states he even passed on a round of golf which he never has done before.  Pt still able to walk about 20 minutes and then bike 20 minutes daily but still having pain in legs.    Pt has had epidural injections for his back pain x 2 rounds  already and is holding out as long as he can for the next one.  Does not take NSAIDS due to CRI.   Pt is starting to notice it affecting his activities of daily living but still not willing to consider surgery at this time.   Review of Systems  Musculoskeletal: Positive for back pain.   Denies fever, chills, nausea vomiting abdominal pain, dysuria, chest pain, shortness of breath dyspnea  Past medical history, social, surgical and family history all reviewed.      Objective:   Physical Exam  Gen: NAD, pt balance and gait appear to be fine get up and go test is normal CV: RRR no murmur appreciated Pul: CTAB Abd: BS+, NT, ND no masses appreciated.  Ext: no edema 1+ pulses bilaterally 2+ DTR no clonus. Sensation intact MSK:  Pt does have some paraspinal tightness and tenderness of lumbar region mild tightness of pirformis muscle on right.  Same as before  Left shoulder + Hawkins, show some rotator cuff syndrome like qualities. Full passive ROM. NVI distally.  Neg SLT Neg trendelenburg   OMT Findings: Cervical:none Thoracic: T5 rotated and side bent right Lumbar: L3-5 rotated and side bent right Sacrum: right on right    Assessment & Plan:

## 2010-12-20 NOTE — Assessment & Plan Note (Signed)
Pt has theraband will do exercises. Will follow up in 3 weeks, pt is against medicine if he does not need it.

## 2010-12-20 NOTE — Assessment & Plan Note (Signed)
Articular technique done on side with marked improvement will return in 3 weeks for re-evaluation.

## 2010-12-20 NOTE — Assessment & Plan Note (Signed)
History of spinal stenosis would if come back with no improvement consider formal PT to help with stretching and iontophoresis. RTC in 3 weeks.

## 2010-12-24 ENCOUNTER — Other Ambulatory Visit: Payer: Self-pay | Admitting: Sports Medicine

## 2011-01-08 ENCOUNTER — Ambulatory Visit (INDEPENDENT_AMBULATORY_CARE_PROVIDER_SITE_OTHER): Payer: BC Managed Care – PPO | Admitting: Sports Medicine

## 2011-01-08 ENCOUNTER — Ambulatory Visit (HOSPITAL_COMMUNITY)
Admission: RE | Admit: 2011-01-08 | Discharge: 2011-01-08 | Disposition: A | Discharge status: Home / Self Care | Payer: BC Managed Care – PPO | Source: Ambulatory Visit | Attending: Sports Medicine | Admitting: Sports Medicine

## 2011-01-08 DIAGNOSIS — I10 Essential (primary) hypertension: Secondary | ICD-10-CM

## 2011-01-08 NOTE — Progress Notes (Signed)
  Subjective:    Patient ID: Paul [name], male    DOB: 26-Sep-1934, 76 y.o.   MRN: [id number]  HPI Patient comes in for exercise tolerance test. He continues to be very active and do physical work outs in spite of his chronic medical illnesses. His cardiac risks include hypertension hyperlipidemia chronic kidney disease and gout.  He does not have chest pain unless he tries to run or pushes himself too hard during a workout.   Review of Systems     Objective:   Physical Exam  No acute distress  Resting blood pressure was 146/90 and maximal stress test blood pressure was 196/90  Exercise tolerance test by Bruce protocol The patient completed 10 minutes At a heart rate greater than 150 he had 1-2 mm of ST segment depression in the inferior and lateral leads. This completely resolved in 1 minute of recovery. His maximum predicted heart rate is 146 so this is at greater than 100%.  Heart rate achieved was 157/106% of predicted  Workload was 11+ METs which would place him at greater than 80th percentile for age      Assessment & Plan:

## 2011-01-08 NOTE — Assessment & Plan Note (Signed)
Based on the patient's exercise tolerance test his blood pressure is in reasonable control  His risk for coronary artery disease in the next 4 years is approximately 1/2 normal for his age group in spite of his risk factors  I advised him not to push to the level that we did on the stress test but try to keep his exercise pattern to where his heart rate stays no higher than approximately 140

## 2011-01-09 ENCOUNTER — Ambulatory Visit: Payer: BC Managed Care – PPO | Admitting: Family Medicine

## 2011-01-22 LAB — CBC
HCT: 24.8 — ABNORMAL LOW
HCT: 27.2 — ABNORMAL LOW
HCT: 27.8 — ABNORMAL LOW
HCT: 29.1 — ABNORMAL LOW
Hemoglobin: 8.4 — ABNORMAL LOW
Hemoglobin: 9.3 — ABNORMAL LOW
Hemoglobin: 9.3 — ABNORMAL LOW
Hemoglobin: 9.6 — ABNORMAL LOW
MCHC: 33.1
MCHC: 33.6
MCHC: 34
MCHC: 34.2
MCV: 82.1
MCV: 82.3
MCV: 82.4
MCV: 82.8
Platelets: 105 — ABNORMAL LOW
Platelets: 110 — ABNORMAL LOW
Platelets: 125 — ABNORMAL LOW
Platelets: 82 — ABNORMAL LOW
RBC: 3.02 — ABNORMAL LOW
RBC: 3.3 — ABNORMAL LOW
RBC: 3.38 — ABNORMAL LOW
RBC: 3.52 — ABNORMAL LOW
RDW: 16 — ABNORMAL HIGH
RDW: 16.3 — ABNORMAL HIGH
RDW: 16.4 — ABNORMAL HIGH
RDW: 16.7 — ABNORMAL HIGH
WBC: 12 — ABNORMAL HIGH
WBC: 8.2
WBC: 8.6
WBC: 8.9

## 2011-01-22 LAB — BASIC METABOLIC PANEL
BUN: 16
BUN: 17
BUN: 21
BUN: 22
CO2: 24
CO2: 24
CO2: 29
CO2: 29
Calcium: 8.1 — ABNORMAL LOW
Calcium: 8.1 — ABNORMAL LOW
Calcium: 8.1 — ABNORMAL LOW
Calcium: 8.4
Chloride: 101
Chloride: 104
Chloride: 105
Chloride: 98
Creatinine, Ser: 1.57 — ABNORMAL HIGH
Creatinine, Ser: 1.69 — ABNORMAL HIGH
Creatinine, Ser: 1.89 — ABNORMAL HIGH
Creatinine, Ser: 1.92 — ABNORMAL HIGH
GFR calc Af Amer: 42 — ABNORMAL LOW
GFR calc Af Amer: 43 — ABNORMAL LOW
GFR calc Af Amer: 49 — ABNORMAL LOW
GFR calc Af Amer: 53 — ABNORMAL LOW
GFR calc non Af Amer: 35 — ABNORMAL LOW
GFR calc non Af Amer: 35 — ABNORMAL LOW
GFR calc non Af Amer: 40 — ABNORMAL LOW
GFR calc non Af Amer: 44 — ABNORMAL LOW
Glucose, Bld: 126 — ABNORMAL HIGH
Glucose, Bld: 137 — ABNORMAL HIGH
Glucose, Bld: 167 — ABNORMAL HIGH
Glucose, Bld: 178 — ABNORMAL HIGH
Potassium: 3.2 — ABNORMAL LOW
Potassium: 3.6
Potassium: 3.8
Potassium: 3.9
Sodium: 132 — ABNORMAL LOW
Sodium: 136
Sodium: 140
Sodium: 140

## 2011-01-22 LAB — PROTIME-INR
INR: 1.1
INR: 1.1
INR: 1.3
INR: 1.6 — ABNORMAL HIGH
Prothrombin Time: 14.2
Prothrombin Time: 14.5
Prothrombin Time: 16.9 — ABNORMAL HIGH
Prothrombin Time: 19.5 — ABNORMAL HIGH

## 2011-01-24 ENCOUNTER — Encounter: Payer: Self-pay | Admitting: Family Medicine

## 2011-01-24 ENCOUNTER — Ambulatory Visit (INDEPENDENT_AMBULATORY_CARE_PROVIDER_SITE_OTHER): Payer: BC Managed Care – PPO | Admitting: Family Medicine

## 2011-01-24 DIAGNOSIS — IMO0002 Reserved for concepts with insufficient information to code with codable children: Secondary | ICD-10-CM

## 2011-01-24 DIAGNOSIS — M999 Biomechanical lesion, unspecified: Secondary | ICD-10-CM

## 2011-01-24 DIAGNOSIS — M4800 Spinal stenosis, site unspecified: Secondary | ICD-10-CM

## 2011-01-24 NOTE — Assessment & Plan Note (Signed)
After verbal consent pt did have HVLA, with marked improvement.  Gave side effects to look out for and can take anti inflammatories in the acute time frame.   

## 2011-01-24 NOTE — Assessment & Plan Note (Signed)
Patient has been struggling with this problem for some time has history of spinal stenosis. We will do physical therapy at this time to try to help him increase his flexibility of the hamstrings which could be beneficial as well as do some back school to help him increase range of motion and with the pain. Referral filled out wonderful staff we'll work on getting an appointment for him patient lives near [facility] relevant and would like therapy in that vicinity. Patient will followup in about one month for and more manipulation is helpful

## 2011-01-24 NOTE — Progress Notes (Signed)
  Subjective:    Patient ID: Paul [name], male    DOB: [birth year], 76 y.o.   MRN: [id number]  HPI  Patient is here for followup for his spinal stenosis. Patient has been getting osteopathic manipulation with some minimal improvement during this time. Patient had a long weekend at the [employer] playing golf for 5 days straight. Surprisingly he is a little more tender and having a little more back pain patient denies any new findings or any numbness in the legs. Has noticed though that his claudication that occurs in his legs seems to be coming on a little bit sooner with his walking at about 10 minutes and said of irregular 20 minutes that he was doing previously. Patient still walks for 10 minutes as well as rides his bike for 15 minutes and then walks for another 10 minutes patient though is not stretching at this time. Patient denies any bowel or bladder problems denies any fevers or chills  Review of Systems As above otherwise negative for as related to the a complaint    Objective:   Physical Exam  Gen: NAD, pt balance and gait appear to be fine get up and go test is normal CV: RRR no murmur appreciated Pul: CTAB Ext: no edema 1+ pulses bilaterally 2+ DTR no clonus. Sensation intact MSK:  Pt does have some paraspinal tightness and tenderness of lumbar region mild tightness of pirformis muscle on right.   Neg SLT Neg trendelenburg  Significant hamstring tightness bilaterally OMT Findings: Cervical:none Thoracic: T5 rotated and side bent right Lumbar: L3-5 rotated and side bent right Sacrum: right on right    Assessment & Plan:

## 2011-01-30 ENCOUNTER — Ambulatory Visit: Payer: BC Managed Care – PPO | Attending: Family Medicine | Admitting: Physical Therapy

## 2011-01-30 DIAGNOSIS — M545 Low back pain, unspecified: Secondary | ICD-10-CM | POA: Insufficient documentation

## 2011-01-30 DIAGNOSIS — M2560 Stiffness of unspecified joint, not elsewhere classified: Secondary | ICD-10-CM | POA: Insufficient documentation

## 2011-01-30 DIAGNOSIS — IMO0001 Reserved for inherently not codable concepts without codable children: Secondary | ICD-10-CM | POA: Insufficient documentation

## 2011-02-04 ENCOUNTER — Ambulatory Visit: Payer: BC Managed Care – PPO | Admitting: Physical Therapy

## 2011-02-06 ENCOUNTER — Ambulatory Visit: Payer: BC Managed Care – PPO | Admitting: Physical Therapy

## 2011-02-07 ENCOUNTER — Telehealth: Payer: Self-pay | Admitting: *Deleted

## 2011-02-07 NOTE — Telephone Encounter (Signed)
Pt states he went to PT and did some stretching on his back while using a heating pad, and thinks he pulled a muscle.  Would like an appointment Monday.  Advised to ice a few times a day this weekend, and take tramadol q6h prn.  Asked him to call back Monday to let me know how he is doing.  Advised we do not have any openings on Monday or Tuesday, that seeing Dr. Katrinka Blazing at [facility] is a good option, if he needs to be seen at the beginning of the week, or he can be seen here on Wed.  Pt agreeable.

## 2011-02-10 LAB — URINALYSIS, ROUTINE W REFLEX MICROSCOPIC
Bilirubin Urine: NEGATIVE
Glucose, UA: NEGATIVE
Hgb urine dipstick: NEGATIVE
Ketones, ur: NEGATIVE
Nitrite: NEGATIVE
Protein, ur: NEGATIVE
Specific Gravity, Urine: 1.019
Urobilinogen, UA: 1
pH: 6.5

## 2011-02-10 LAB — I-STAT 8, (EC8 V) (CONVERTED LAB)
Acid-Base Excess: 4 — ABNORMAL HIGH
BUN: 24 — ABNORMAL HIGH
Bicarbonate: 28 — ABNORMAL HIGH
Chloride: 103
Glucose, Bld: 107 — ABNORMAL HIGH
HCT: 45
Hemoglobin: 15.3
Operator id: 277751
Potassium: 3.4 — ABNORMAL LOW
Sodium: 139
TCO2: 29
pCO2, Ven: 40.9 — ABNORMAL LOW
pH, Ven: 7.444 — ABNORMAL HIGH

## 2011-02-10 LAB — POCT I-STAT CREATININE
Creatinine, Ser: 1.8 — ABNORMAL HIGH
Operator id: 277751

## 2011-02-11 ENCOUNTER — Ambulatory Visit: Payer: BC Managed Care – PPO | Admitting: Physical Therapy

## 2011-02-14 ENCOUNTER — Encounter: Payer: BC Managed Care – PPO | Admitting: Physical Therapy

## 2011-02-18 ENCOUNTER — Ambulatory Visit: Payer: BC Managed Care – PPO | Admitting: Physical Therapy

## 2011-02-20 ENCOUNTER — Ambulatory Visit (INDEPENDENT_AMBULATORY_CARE_PROVIDER_SITE_OTHER): Payer: BC Managed Care – PPO | Admitting: Family Medicine

## 2011-02-20 ENCOUNTER — Ambulatory Visit: Payer: BC Managed Care – PPO | Admitting: Physical Therapy

## 2011-02-20 ENCOUNTER — Encounter: Payer: Self-pay | Admitting: Family Medicine

## 2011-02-20 VITALS — BP 152/68 | HR 61 | Temp 98.0°F | Ht 69.0 in | Wt 196.0 lb

## 2011-02-20 DIAGNOSIS — Z23 Encounter for immunization: Secondary | ICD-10-CM

## 2011-02-20 DIAGNOSIS — M999 Biomechanical lesion, unspecified: Secondary | ICD-10-CM

## 2011-02-20 NOTE — Assessment & Plan Note (Signed)
After verbal consent patient did have some articular and muscle energy techniques done focusing on the lumbar and sacral regions. Patient tolerated the procedure well with increased range of motion of the lower back in all planes patient will return in 4 weeks for reevaluation and likely more need for manipulation. Patient told once again with the spinal stenosis his lungs were not having worsening or any other red flags that patient knows about can continue this treatment. Patient will also continue 4 weeks of physical therapy. 

## 2011-02-20 NOTE — Progress Notes (Signed)
  Subjective:    Patient ID: Paul [name], male    DOB: [birth year], 76 y.o.   MRN: [id number]  HPI Patient is here for followup for his spinal stenosis. Patient has been getting osteopathic manipulation with some minimal improvement during this time. Patient though states he's not getting worse and before he was having manipulation he states he fell he was on a downward spiral. Patient is still able to do all his activities of daily living including playing golf 4-5 times weekly. Patient did start physical therapy after last followup patient is having some iontophoresis which has helped but made him a little sore the day after having the treatment. Patient denies any new symptoms such as peripheral neuropathy or bowel or bladder problems states most of his pain at this time is on the right side of his back. Patient does have some claudication that occurs on the left lower extremity   Review of Systems Denies fever chills bowel or bladder incontinence or any numbness or weakness in the extremities.    Objective:   Physical Exam Gen: NAD, pt balance and gait appear to be fine get up and go test is normal CV: RRR no murmur appreciated Pul: CTAB Ext: no edema 1+ pulses bilaterally 2+ DTR no clonus. Sensation intact MSK:  Pt does have some paraspinal tightness and tenderness of lumbar region mild tightness on right.   Neg SLT       Neg trendelenburg  Significant hamstring tightness bilaterally some improvement from last visit OMT Findings: Cervical:none Thoracic: T5 rotated and side bent right Lumbar: L5 rotated and side bent right Sacrum: right on right    Assessment & Plan:

## 2011-02-20 NOTE — Assessment & Plan Note (Signed)
After verbal consent patient did have some articular and muscle energy techniques done focusing on the lumbar and sacral regions. Patient tolerated the procedure well with increased range of motion of the lower back in all planes patient will return in 4 weeks for reevaluation and likely more need for manipulation. Patient told once again with the spinal stenosis his lungs were not having worsening or any other red flags that patient knows about can continue this treatment. Patient will also continue 4 weeks of physical therapy.

## 2011-02-25 ENCOUNTER — Ambulatory Visit: Payer: BC Managed Care – PPO | Admitting: Physical Therapy

## 2011-02-27 ENCOUNTER — Ambulatory Visit: Payer: BC Managed Care – PPO | Attending: Family Medicine | Admitting: Physical Therapy

## 2011-02-27 DIAGNOSIS — M545 Low back pain, unspecified: Secondary | ICD-10-CM | POA: Insufficient documentation

## 2011-02-27 DIAGNOSIS — M2560 Stiffness of unspecified joint, not elsewhere classified: Secondary | ICD-10-CM | POA: Insufficient documentation

## 2011-02-27 DIAGNOSIS — IMO0001 Reserved for inherently not codable concepts without codable children: Secondary | ICD-10-CM | POA: Insufficient documentation

## 2011-03-03 ENCOUNTER — Telehealth: Payer: Self-pay | Admitting: *Deleted

## 2011-03-03 NOTE — Telephone Encounter (Signed)
PER EMAIL CANCELLED ALL PATIENTS APPOINTMENTS PER NURSE

## 2011-03-05 ENCOUNTER — Ambulatory Visit: Payer: BC Managed Care – PPO | Admitting: Physical Therapy

## 2011-03-16 ENCOUNTER — Other Ambulatory Visit: Payer: Self-pay | Admitting: Family Medicine

## 2011-03-17 NOTE — Telephone Encounter (Signed)
Refill request

## 2011-03-18 ENCOUNTER — Ambulatory Visit (INDEPENDENT_AMBULATORY_CARE_PROVIDER_SITE_OTHER): Payer: BC Managed Care – PPO | Admitting: Family Medicine

## 2011-03-18 ENCOUNTER — Encounter: Payer: Self-pay | Admitting: Family Medicine

## 2011-03-18 DIAGNOSIS — IMO0002 Reserved for concepts with insufficient information to code with codable children: Secondary | ICD-10-CM

## 2011-03-18 DIAGNOSIS — M999 Biomechanical lesion, unspecified: Secondary | ICD-10-CM

## 2011-03-18 NOTE — Assessment & Plan Note (Signed)
Discuss possibility of medication, not taking any medication for pain at moment has tramadol on hand, no need to talk about surgery at this time.

## 2011-03-18 NOTE — Progress Notes (Signed)
  Subjective:    Patient ID: Paul [name], male    DOB: 30-Apr-1934, 76 y.o.   MRN: [id number]  HPI  Patient is here for followup for his spinal stenosis.  Patient has been getting osteopathic manipulation with some improvement during this time, almost one year now.  Pt also started PT now and doing well, feels he can do exercise at home, given more core routine.   Patient is still able to do all his activities of daily living including playing golf 4-5 times weekly which is up from when he first cam and only able to play once a week. . Patient denies any new symptoms such as peripheral neuropathy or bowel or bladder problems states most of his pain at this time is on the left side of his back with mild radiculopathy to mid thing and calf.  Patient does have some claudication that occurs on the left lower extremity with prolong activity or sitting.  Pt is scheduled to see his PCP and will discuss blood pressure which is minorly elevated today. Pt denies headache, vision changes etc.    Review of Systems  Denies fever chills bowel or bladder incontinence or any numbness or weakness in the extremities.    Objective:   Physical Exam  Gen: NAD, pt balance and gait appear to be fine get up and go test is normal CV: RRR no murmur appreciated Pul: CTAB Ext: no edema 1+ pulses bilaterally 2+ DTR no clonus. Sensation intact MSK:  Pt does have some paraspinal tightness and tenderness of lumbar region mild tightness on right.   Neg SLT       Neg trendelenburg  Significant hamstring tightness bilaterally some improvement from last visit OMT Findings: Cervical:none Thoracic: T5 rotated and side bent left  Lumbar: L5 rotated and side bent right Sacrum: right on right    Assessment & Plan:

## 2011-03-18 NOTE — Assessment & Plan Note (Signed)
After verbal consent pt did have articular and soft tissue technique, with marked improvement.  Gave side effects to look out for and can take anti inflammatories (not motrin) in the acute time frame.  RTC in 4-6 weeks.

## 2011-03-18 NOTE — Patient Instructions (Signed)
Do your exercises and stretch after golfing for sure Try either a step stool or a foot massager when you are sitting  Come back and see me in 3-6 weeks.

## 2011-03-18 NOTE — Assessment & Plan Note (Signed)
After verbal consent pt did have articular and soft tissue technique, with marked improvement.  Gave side effects to look out for and can take anti inflammatories (not motrin) in the acute time frame.  RTC in 4-6 weeks.  

## 2011-03-24 ENCOUNTER — Ambulatory Visit: Payer: BC Managed Care – PPO | Admitting: Sports Medicine

## 2011-03-25 ENCOUNTER — Ambulatory Visit (INDEPENDENT_AMBULATORY_CARE_PROVIDER_SITE_OTHER): Payer: BC Managed Care – PPO | Admitting: Family Medicine

## 2011-03-25 ENCOUNTER — Encounter: Payer: Self-pay | Admitting: Family Medicine

## 2011-03-25 ENCOUNTER — Other Ambulatory Visit: Payer: Self-pay | Admitting: Family Medicine

## 2011-03-25 VITALS — BP 164/82 | HR 65 | Ht 69.0 in | Wt 195.0 lb

## 2011-03-25 DIAGNOSIS — I10 Essential (primary) hypertension: Secondary | ICD-10-CM

## 2011-03-25 DIAGNOSIS — N183 Chronic kidney disease, stage 3 unspecified: Secondary | ICD-10-CM

## 2011-03-25 MED ORDER — LISINOPRIL 40 MG PO TABS: 40.0000 mg | ORAL_TABLET | Freq: Every day | ORAL | Status: DC

## 2011-03-25 NOTE — Assessment & Plan Note (Signed)
Will increase dose of lisinopril from 20 to 40mg  daily. FOr repeat metabolic panel in 2 to 4 weeks.  He has had CKD3, will follow K+ and renal function.  Already on dihydropyridine CCB, would prefer not to add beta blocker given its likely effect on his physical activity.

## 2011-03-25 NOTE — Progress Notes (Signed)
  Subjective:    Patient ID: Paul [name], male    DOB: 28-Feb-1935, 76 y.o.   MRN: [id number]  HPI Several complaints in this visit: 1) Recounted events surrounding sciatica.  Physical therapy and elevation of legs has helped him somewhat. Bucket seats in car are comfortable. Playing golf 3 times weekly.  2) Has had elevations in BP during past few visits; no med changes.  Had exercise stress test earlier this year with Dr Darrick Penna, notes reviewed in Epic. Negative ETT. Denies chest pain or shortness of breath, no other anginal equivalent symptoms.  Remaining active.   Has voiced concerns about possibility of certain antihypertensives (he names HCTZ or other diuretics) because they have affected his activity in the past.  3) When traveling with friends for golf, they have remarked that he snores a lot, occasion interruptions in breathing.  I applied the Epworth Sleepiness Scale today, he scores a 2 (1 point for snoozing when reading something uninteresting; 1 point for snoozing in front of TV).  Feels rested in the mornings.  Only HA occur after shiraz wine, burn off without taking any meds.  No naps. Overall, low likelihood for OSA based on ESS.  4) question about whether he should have zostavax; does not recall whether he had chickenpox in the past.  Came from a large family, others in the family did have chickenpox.  Would like to know if he had chickenpox before he decides on zostavax.   5) Had some skin lesions on R cheek which we removed with cryo last year.  Would like one remaining lesion removed.  Cannot locate the cryo gun today in our office.   ROS See HPI.   Review of Systems Physical Exam: Well appearing, able to get to exam table (up and down) without assistance, no apparent distress. HEENT Neck supple. No cervical adenopathy. Small (2mm diam) raised papule along R cheek, resembles seb keratosis.  COR S1S2, no extra sounds PULM Clear bilaterally, no rales or wheezes. LEs; No edema in  legs or ankles.      Objective:   Physical Exam        Assessment & Plan:

## 2011-03-25 NOTE — Assessment & Plan Note (Signed)
To check metabolic panel to follow after increase in AcEI dosing.

## 2011-03-25 NOTE — Telephone Encounter (Signed)
Refill request

## 2011-03-25 NOTE — Patient Instructions (Addendum)
It was a pleasure to see you today.   I am increasing the dose of your lisinopril from 20 to 40mg  daily.  I sent a new prescription to your pharmacy.   I will have you come back for an RN visit to check your blood pressure in the next 2 to 6 weeks; also fasting labs (nothing to eat for 8 hrs prior) at that time.   I will be in touch after the labs are back.

## 2011-04-25 ENCOUNTER — Encounter: Payer: Self-pay | Admitting: Family Medicine

## 2011-04-25 ENCOUNTER — Ambulatory Visit (INDEPENDENT_AMBULATORY_CARE_PROVIDER_SITE_OTHER): Payer: BC Managed Care – PPO | Admitting: Family Medicine

## 2011-04-25 ENCOUNTER — Other Ambulatory Visit: Payer: BC Managed Care – PPO

## 2011-04-25 DIAGNOSIS — M999 Biomechanical lesion, unspecified: Secondary | ICD-10-CM

## 2011-04-25 DIAGNOSIS — IMO0002 Reserved for concepts with insufficient information to code with codable children: Secondary | ICD-10-CM

## 2011-04-25 DIAGNOSIS — I10 Essential (primary) hypertension: Secondary | ICD-10-CM

## 2011-04-25 LAB — LIPID PANEL
Cholesterol: 123 mg/dL (ref 0–200)
HDL: 44 mg/dL (ref >39)
LDL Cholesterol: 65 mg/dL (ref 0–99)
Total CHOL/HDL Ratio: 2.8 Ratio
Triglycerides: 69 mg/dL (ref <150)
VLDL: 14 mg/dL (ref 0–40)

## 2011-04-25 LAB — COMPREHENSIVE METABOLIC PANEL
ALT: 16 U/L (ref 0–53)
AST: 19 U/L (ref 0–37)
Albumin: 4 g/dL (ref 3.5–5.2)
Alkaline Phosphatase: 53 U/L (ref 39–117)
BUN: 19 mg/dL (ref 6–23)
CO2: 25 mEq/L (ref 19–32)
Calcium: 9 mg/dL (ref 8.4–10.5)
Chloride: 106 mEq/L (ref 96–112)
Creat: 1.47 mg/dL — ABNORMAL HIGH (ref 0.50–1.35)
Glucose, Bld: 88 mg/dL (ref 70–99)
Potassium: 3.8 mEq/L (ref 3.5–5.3)
Sodium: 143 mEq/L (ref 135–145)
Total Bilirubin: 0.5 mg/dL (ref 0.3–1.2)
Total Protein: 7.1 g/dL (ref 6.0–8.3)

## 2011-04-25 LAB — CBC
HCT: 41.1 % (ref 39.0–52.0)
Hemoglobin: 13.4 g/dL (ref 13.0–17.0)
MCH: 27.1 pg (ref 26.0–34.0)
MCHC: 32.6 g/dL (ref 30.0–36.0)
MCV: 83 fL (ref 78.0–100.0)
Platelets: 154 10*3/uL (ref 150–400)
RBC: 4.95 MIL/uL (ref 4.22–5.81)
RDW: 15.4 % (ref 11.5–15.5)
WBC: 6.8 10*3/uL (ref 4.0–10.5)

## 2011-04-25 NOTE — Assessment & Plan Note (Signed)
After verbal consent patient was treated with HVLA and muscle energy with significant improvement return to clinic in 4 weeks continue current exercises and stretching

## 2011-04-25 NOTE — Patient Instructions (Signed)
Patient given verbal instructions 

## 2011-04-25 NOTE — Progress Notes (Signed)
Cmp,cbc,flp and varciella done today [facility] Paul [name]

## 2011-04-25 NOTE — Assessment & Plan Note (Signed)
Patient continues to improve overall with his activities of daily living. Patient will continue doing the exercises we'll continue to see me for manipulation as needed patient will return in 4 weeks' time for reevaluation. See no signs of patient progressing where he would need any type of surgery.

## 2011-04-25 NOTE — Progress Notes (Signed)
  Subjective:    Patient ID: Paul [name], male    DOB: June 21, 1934, 76 y.o.   MRN: [id number]  HPI  Patient is here for followup for his spinal stenosis.  Patient has been getting osteopathic manipulation with some improvement during this time, patient is coming for about one year now.  Pt has finished his physical therapy has been doing his home exercises and continuing to strengthen his core.  Patient recently traveled for the holidays and has not been playing golf for the last 20 days. Since that time though he has noticed that the pain has improved. That said he is going to be coping later today and is very excited. Patient denies any new symptoms such as peripheral neuropathy or bowel or bladder problems states most of his pain at this time is on the left side of his back with mild radiculopathy to mid thing and calf.  Patient does have some claudication that occurs on the left lower extremity with prolong activity or sitting. Patient though recently has Armed forces training and education officer for his calves and this has helped significantly.  Review of Systems  Denies fever chills bowel or bladder incontinence or any numbness or weakness in the extremities.    Objective:   Physical Exam  Gen: NAD, pt balance and gait appear to be fine get up and go test is normal CV: RRR no murmur appreciated Pul: CTAB Ext: no edema 1+ pulses bilaterally 2+ DTR no clonus. Sensation intact Neg SLT       Neg trendelenburg  Significant hamstring tightness bilaterally some improvement from last visit OMT Findings: Cervical:none Thoracic: T5 rotated and side bent left T7 extended rotated and side bent right Lumbar: L 3 rotated and side bent right Sacrum: right on right Right anterior ilium    Assessment & Plan:

## 2011-04-25 NOTE — Assessment & Plan Note (Signed)
After verbal consent muscle energy and articulatory technique with marked improvement

## 2011-04-28 LAB — VARICELLA ZOSTER ANTIBODY, IGG: Varicella IgG: 4.16 {ISR} — ABNORMAL HIGH

## 2011-04-30 ENCOUNTER — Encounter: Payer: Self-pay | Admitting: Family Medicine

## 2011-05-13 ENCOUNTER — Ambulatory Visit (INDEPENDENT_AMBULATORY_CARE_PROVIDER_SITE_OTHER): Payer: BC Managed Care – PPO | Admitting: Family Medicine

## 2011-05-13 ENCOUNTER — Encounter: Payer: Self-pay | Admitting: Family Medicine

## 2011-05-13 VITALS — BP 158/80 | HR 62 | Ht 69.0 in | Wt 192.0 lb

## 2011-05-13 DIAGNOSIS — L989 Disorder of the skin and subcutaneous tissue, unspecified: Secondary | ICD-10-CM

## 2011-05-13 DIAGNOSIS — I10 Essential (primary) hypertension: Secondary | ICD-10-CM

## 2011-05-13 DIAGNOSIS — M109 Gout, unspecified: Secondary | ICD-10-CM

## 2011-05-13 DIAGNOSIS — M654 Radial styloid tenosynovitis [de Quervain]: Secondary | ICD-10-CM

## 2011-05-13 DIAGNOSIS — Z23 Encounter for immunization: Secondary | ICD-10-CM

## 2011-05-13 MED ORDER — COLCHICINE 0.6 MG PO TABS: 0.6000 mg | ORAL_TABLET | Freq: Two times a day (BID) | ORAL | Status: DC | PRN

## 2011-05-13 MED ORDER — ZOSTER VACCINE LIVE 19400 UNT/0.65ML ~~LOC~~ SOLR: 0.6500 mL | Freq: Once | SUBCUTANEOUS | Status: AC

## 2011-05-13 NOTE — Patient Instructions (Addendum)
It was a pleasure seeing you today.  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  You may use a SPICA THUMB SPLINT, available over the counter at pharmacy or medical supply store.  Please be in touch if you have redness or pain around the site of the cryotherapy to the Right cheek done today.   I would like to see you back in 4 to 8 weeks with your home BP cuff to determine if we need to change/add medication to your regimen.   De Quervain's Disease Paul [name] disease is a condition often seen in racquet sports where there is a soreness (inflammation) in the cord like structures (tendons) which attach muscle to bone on the thumb side of the wrist. There may be a tightening of the tissuesaround the tendons. This condition is often helped by giving up or modifying the activity which caused it. When conservative treatment does not help, surgery may be required. Conservative treatment could include changes in the activity which brought about the problem or made it worse. Anti-inflammatory medications and injections may be used to help decrease the inflammation and help with pain control. Your caregiver will help you determine which is best for you. DIAGNOSIS  Often the diagnosis (learning what is wrong) can be made by examination. Sometimes x-rays are required. HOME CARE INSTRUCTIONS   Apply ice to the sore area for 15 to 20 minutes, 3 to 4 times per day while awake. Put the ice in a plastic bag and place a towel between the bag of ice and your skin. This is especially helpful if it can be done after all activities involving the sore wrist.   Temporary splinting may help.   Only take over-the-counter or prescription medicines for pain, discomfort or fever as directed by your caregiver.  SEEK MEDICAL CARE IF:   Pain relief is not obtained with medications, or if you have increasing pain and seem to be getting worse rather than better.  MAKE SURE YOU:     Understand these instructions.   Will watch your condition.   Will get help right away if you are not doing well or get worse.  Document Released: 01/07/2001 Document Revised: 12/25/2010 Document Reviewed: 04/14/2005 [facility] Patient Information 2012 [place], [place].

## 2011-05-14 DIAGNOSIS — L989 Disorder of the skin and subcutaneous tissue, unspecified: Secondary | ICD-10-CM | POA: Insufficient documentation

## 2011-05-14 NOTE — Assessment & Plan Note (Signed)
Discussed sparing use of colchicine when he gets a flare in his L fourth finger PIP joint.  Discussed that it is inadvisable to take regularly given his renal insufficiency.

## 2011-05-14 NOTE — Assessment & Plan Note (Signed)
Manual readings in the 150s (systolic) with HR in the low-60s.  I explained why I believe this is an acceptable BP in a patient in his mid-70s.  He will bring his cuff to next visit, to discuss whether to alter his regimen then.  I do not plan to go up on his ACEI or dihydropyridine CCB; therefore, addition of BB is likely next step to consider (low dose given HR).  HCTZ not advisable with gout history.

## 2011-05-14 NOTE — Progress Notes (Signed)
  Subjective:    Patient ID: Paul [name], male    DOB: October 02, 1934, 77 y.o.   MRN: [id number]  HPI Dr. Willette Brace is here for follow up on several issues:  1. Raised lesion on R cheek; has had this frozen off in the past. Not growing in size; would like cryo applied today.  2. Left hand with 4th digit PIP joint with swelling; occasionally becomes painful and red.  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  Has a presumptive diagnosis of gout, used to take colchicine but does not have now.  Does not have the redness and pain at this time.   3. Review labs.  Of note, his renal function is around Cr 1.47, which appears to be his baseline (prior values in the 1.2 to 1.7-range).    4. Still with the spinal stenosis pain.  The thing which he believes has been the most helpful is sitting with hips flexed (raises legs on crate under his desk at work).  Car with bucket seats helps too. Does not take NSAIDs (discussed why NSAIDS are not advisable given renal insufficiency).  5. Blood pressure.  Home cuff readings in the SBP 170s.  No chest pain or dyspnea.  Had ETT with Dr Darrick Penna in 2012 which was negative.  Recheck in the office with manual cuff with readings 158/80, 154/80.  6. Preventive: Would like zostavax.  Reports never having pneumovax, discussed this today.   7. L wrist with pain upon ulnar movement of the joint. Review of Systems See HPI    Objective:   Physical Exam Well appearing, no apparent distress HEENT Neck supple. No adenopathy. R cheek with raised round 3mm diameter lesion, not hyperpigmented or ulcerated. COR S1S2, no extra sounds PULM Clear bilaterally, no rales or wheezes EXT: L hand: 4th PIP joint flexed and rigid; no erythema or edema; able to flex further with firm handgrip.  Brisk cap refill in the 4th digit.  Some DIP nodularity without erythema in the same L 4th digit.  No ulnar deviation in either hand.  Similar stiffness in the R 5th PIP, L 5th  PIP.  Palpable radial pulses bilaterally Mildly positive Finkelsteins test on L wrist       Assessment & Plan:

## 2011-05-15 ENCOUNTER — Encounter: Payer: Self-pay | Admitting: *Deleted

## 2011-05-15 ENCOUNTER — Ambulatory Visit (INDEPENDENT_AMBULATORY_CARE_PROVIDER_SITE_OTHER): Payer: BC Managed Care – PPO | Admitting: *Deleted

## 2011-05-15 VITALS — Temp 98.2°F

## 2011-05-15 DIAGNOSIS — Z23 Encounter for immunization: Secondary | ICD-10-CM

## 2011-05-15 MED ORDER — ZOSTER VACCINE LIVE 19400 UNT/0.65ML ~~LOC~~ SOLR: 0.6500 mL | Freq: Once | SUBCUTANEOUS | Status: DC

## 2011-05-15 NOTE — Progress Notes (Signed)
Patient in for zostax vaccine. He picked up from pharmacy 10 minutes ago. Marland Kitchen Also states he received pneumonia vaccine 01/14.  His left upper arm is swollen and sore to touch . No redness noted but is warm to touch.  Patient has actually had a pneumonia vaccine 08/2009. Dr. Leveda Anna notified and he came to speak with patient. Advised will subside  eventually . If worsens to let us know.  Zostavax entered historically because the medication was provided by patient . Given subq in right arm [employer] lot #   [id number].

## 2011-05-15 NOTE — Progress Notes (Signed)
Addended byArlyss Repress on: 05/15/2011 12:35 PM   Modules accepted: Orders

## 2011-05-22 ENCOUNTER — Encounter: Payer: Self-pay | Admitting: Family Medicine

## 2011-05-22 ENCOUNTER — Ambulatory Visit (INDEPENDENT_AMBULATORY_CARE_PROVIDER_SITE_OTHER): Payer: BC Managed Care – PPO | Admitting: Family Medicine

## 2011-05-22 DIAGNOSIS — IMO0002 Reserved for concepts with insufficient information to code with codable children: Secondary | ICD-10-CM

## 2011-05-22 DIAGNOSIS — M999 Biomechanical lesion, unspecified: Secondary | ICD-10-CM

## 2011-05-22 NOTE — Assessment & Plan Note (Signed)
Spinal stenosis continue conservative management patient is doing very well no changes needed still responding to osteopathic manipulation therapy. Able to decrease frequency to 6 weeks appointment has been made.

## 2011-05-22 NOTE — Patient Instructions (Signed)
Spinal Stenosis One cause of back pain is spinal stenosis. Stenosis means abnormal narrowing. The spinal canal contains and protects the spinal nerve roots. In spinal stenosis, the spinal canal narrows and pinches the spinal cord and nerves. This causes low back pain and pain in the legs. Stenosis may pinch the nerves that control muscles and sensation in the legs. This leads to pain and abnormal feelings in the leg muscles and areas supplied by those nerves. CAUSES  Spinal stenosis often happens to people as they get older and arthritic boney growths occur in their spinal canal. There is also a loss of the disk height between the bones of the back, which also adds to this problem. Sometimes the problem is present at birth. SYMPTOMS   Pain that is generally worse with activities, particularly standing and walking.   Numbness, tingling, hot or cold feelings, weakness, or a weariness in the legs.   Clumsiness, frequent falling, and a foot-slapping gait, which may come as a result of nerve pressure and muscle weakness.  DIAGNOSIS   Your caregiver may suspect spinal stenosis if you have unusual leg symptoms, such as those previously mentioned.   Your orthopedic surgeon may request special imaging exams, such a computerized magnetic scan (MRI) or computerized X-ray scan (CT) to find out the cause of the problem.  THOME CARE INSTRUCTIONS   Flexing the spine by leaning forward while walking may relieve symptoms. Lying with the knees drawn up to the chest may offer some relief. These positions enlarge the space available to the nerves. They may make it easier for stenosis sufferers to walk longer distances.   Rest, followed by gradually resuming activity, also can help.   Aerobic activity, such as bicycling or swimming, is often recommended.   Losing weight can also relieve some of the load on the spine.   Application of warm or cold compresses to the area of pain can be helpful.  SEEK MEDICAL  CARE IF:   The periods of relief between episodes of pain become shorter and shorter.   You experience pain that radiates down your leg, even when you are not standing or walking.  SEEK IMMEDIATE MEDICAL CARE IF:   You have a loss of bowel or bladder control.   You have a sudden loss of feeling in your legs.   You suddenly cannot move your legs.  Document Released: 07/05/2003 Document Revised: 12/25/2010 Document Reviewed: 08/30/2009 [facility] Patient Information 2012 [place], [place].

## 2011-05-22 NOTE — Assessment & Plan Note (Signed)
After verbal consent pt did have muscle energy and articulatory technique, with marked improvement.  Gave side effects to look out for and can take anti inflammatories in the acute time frame.

## 2011-05-22 NOTE — Progress Notes (Signed)
  Subjective:    Patient ID: Paul [name], male    DOB: 03-01-1935, 77 y.o.   MRN: [id number]  HPI  Patient is here for followup for his spinal stenosis.  Patient has been getting osteopathic manipulation with some improvement during this time, patient is coming for about one year now.  Pt has finished his physical therapy has been doing his home exercises and continuing to strengthen his core. Patient states increasing flexibility of the hip seemed to be the most improvement. Patient denies any new symptoms such as peripheral neuropathy or bowel or bladder problems states most of his pain at this time is on the left side of his back with mild radiculopathy to mid thing and calf.  Patient does have some claudication that occurs on the left lower extremity with prolong activity or sitting.  Overall patient thinks that he is not worsening but does not know if these improving drastically either. Review of Systems  Denies fever chills bowel or bladder incontinence or any numbness or weakness in the extremities.   Past medical history, social, surgical and family history all reviewed.   Objective:   Physical Exam  vitals reviewed Gen: NAD, pt balance and gait appear to be fine get up and go test is normal CV: RRR no murmur appreciated Pul: CTAB Ext: no edema 1+ pulses bilaterally 2+ DTR no clonus. Sensation intact Neg SLT       Neg trendelenburg  Significant hamstring tightness bilaterally some improvement from last visit OMT Findings: Cervical:none Thoracic: T4 rotated and side bent left T8 extended rotated and side bent right Lumbar: L 3 rotated and side bent right L5 flexed rotated left Sacrum: Left on left Right anterior ilium    Assessment & Plan:

## 2011-05-22 NOTE — Assessment & Plan Note (Signed)
After verbal consent pt did have muscle energy and articular technique, with marked improvement.  Gave side effects to look out for and can take anti inflammatories in the acute time frame.   

## 2011-05-26 ENCOUNTER — Telehealth: Payer: Self-pay | Admitting: Family Medicine

## 2011-05-26 ENCOUNTER — Ambulatory Visit: Payer: BC Managed Care – PPO | Admitting: Family Medicine

## 2011-05-26 NOTE — Telephone Encounter (Signed)
Application for Disability Parking Placard placed in Dr. Marinell Blight box for completion. Paul [name]

## 2011-05-26 NOTE — Telephone Encounter (Signed)
Patient dropped off handicapped placard form to be filled out.  Please call when completed. °

## 2011-05-28 ENCOUNTER — Telehealth: Payer: Self-pay | Admitting: Family Medicine

## 2011-05-28 NOTE — Telephone Encounter (Signed)
Patient requests temporary handicapped parking placard due to inability to walk 200 feet, associated with his diagnosis of lumbar DJD with radiculopathy.  Temporary placard form signed for 6 months' duration.  To reassess his back pain and function at next scheduled visit.  Paula Compton, M.D.

## 2011-05-28 NOTE — Telephone Encounter (Signed)
Form completed for temporary handicapped placard based on inability to walk 200 feet.  Completed form to [employer]. Paula Compton, M.D.

## 2011-05-29 NOTE — Telephone Encounter (Signed)
Message left on voicemail that handicap placard is ready for pick up. Placed in file in front office.

## 2011-06-24 ENCOUNTER — Ambulatory Visit (INDEPENDENT_AMBULATORY_CARE_PROVIDER_SITE_OTHER): Payer: BC Managed Care – PPO | Admitting: Family Medicine

## 2011-06-24 DIAGNOSIS — I10 Essential (primary) hypertension: Secondary | ICD-10-CM

## 2011-06-24 DIAGNOSIS — M5137 Other intervertebral disc degeneration, lumbosacral region: Secondary | ICD-10-CM

## 2011-06-24 MED ORDER — GABAPENTIN 300 MG PO CAPS: 300.0000 mg | ORAL_CAPSULE | Freq: Three times a day (TID) | ORAL | Status: DC

## 2011-06-24 NOTE — Patient Instructions (Signed)
It was a pleasure to see you today.   For your BLOOD PRESSURE: 1. Continue to take your felodipine 10mg  daily, and your lisinopril 40mg  daily. If your home readings are consistently above 155/90, then I ask that you INCREASE the FELODIPINE to 20mg  daily, and call my office to report this so that I can adjust your prescription.  If this occurs, I ask that you make a NURSE VISIT for BLOOD PRESSURE CHECK in 2 to 3 weeks.  2. I want you to see our PharmD for ABI measurements, given the concern for vascular disease as a cause of your lower extremity tingling and numbness.   3. I would like to see you back in 6 to 8 weeks.

## 2011-06-24 NOTE — Progress Notes (Addendum)
Paul [name] is a 77 y.o. male with PMHx of htn and gout who presents to [facility] today for bp monitoring.  Pt denies CP, palpitations, SOB, edema, racing heartbeat.  Patient reports HA, especially in the mornings.  Denies weakness, dizziness, lightheadedness. Pt admits that he has not taken plendil for the past 3 days as he ran out.  Pt is also concerned about some peripheral neuropathy in his legs.  He has taken gabapentin in the past and reports that this worked well for him.   PMH reviewed.  ROS as above otherwise neg Medications reviewed. Current Outpatient Prescriptions  Medication Sig Dispense Refill  . aspirin 81 MG chewable tablet Chew 81 mg by mouth daily.        . colchicine 0.6 MG tablet Take 1 tablet (0.6 mg total) by mouth 2 (two) times daily as needed.  20 tablet  1  . felodipine (PLENDIL) 10 MG 24 hr tablet TAKE 1 TABLET BY MOUTH EVERY DAY  34 tablet  11  . gabapentin (NEURONTIN) 300 MG capsule Take 1 capsule (300 mg total) by mouth 3 (three) times daily.  90 capsule  3  . LEVITRA 20 MG tablet TAKE 1/2 TO 1 TABLET BY MOUTH AT BEDTIME AS NEEDED  30 tablet  0  . lisinopril (PRINIVIL,ZESTRIL) 40 MG tablet Take 1 tablet (40 mg total) by mouth daily.  30 tablet  6  . Multiple Vitamin (MULTIVITAMIN) tablet Take 1 tablet by mouth daily.        Marland Kitchen POLYETHYLENE GLYCOL 3350 PO Take 17 gm dissolved in 8 oz of water, once daily       . simvastatin (ZOCOR) 40 MG tablet TAKE 1 TABLET BY MOUTH EVERY DAY  30 tablet  11  . traMADol (ULTRAM) 50 MG tablet Take one tablet by mouth every 4-6 hours as needed for pain        Current Facility-Administered Medications  Medication Dose Route Frequency Provider Last Rate Last Dose  . zoster vaccine live (PF) (ZOSTAVAX) injection 19,400 Units  0.65 mL Subcutaneous Once Barbaraann Barthel, MD      . zoster vaccine live (PF) (ZOSTAVAX) injection 19,400 Units  0.65 mL Subcutaneous Once Barbaraann Barthel, MD        Exam:  BP 178/80  Pulse 60  Ht 5\' 9"  (1.753 m)  Wt  192 lb (87.091 kg)  BMI 28.35 kg/m2 Gen: Well NAD Lungs: CTABL Nl WOB Heart: RRR no MRG Exts: Non edematous BL  LE, warm and well perfused.  Neuro: BLE intact to pain (pin prick) sensation with the exception of L and R big toes; proprioception intact  Assessment and Plan: Paul [name] is a 77 yo M with PMHx of htn and gout who presents to PCP for bp control.  BP was measured several times in office today with manual and automatic cuffs.  BP was consistently > 170/90 in both arms; however, pt has not been taking one of his bp meds for the past 3 days so this is not an accurate assessment of his htn control regimen.  Will need to reassess in 6-8 weeks.  1. Continue to take plendil 10 mg and lisinopril 40 mg daily 2. Measure bp daily.  If consistently > 155/90, increase plendil to 20 mg daily. 3. Return for bp check in 1 month.  Return for doctor's appt in 6-8 wks. 4. For peripheral neuropathy: resume gabapentin 300 mg tid.  Gerlene Fee, MS3    Patient seen and examined by  me, accompanied by MS3 Gerlene Fee.  I have reviewed her documentation above and agree with her note.  Paul [name] comes for BP recheck today.  He ran out of his Plendil 3 days ago; has been taking lisinopril alone for that time.  Feels well, denies chest pain but remarks a mild frontal head discomfort ("not really a headache").  His back problems and tingling in feet persist.  Spoke informally with Dr. Darrick Penna and would like to retry the Gabapentin for the tingling in feet.  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   Several readings today with his home cuff, and manual readings by medical student and by me, with results consistently in the 170-180/80s-90s range.  Pulse around 60bpm and regular.  PE: Well appearing, no apparent distress HEENT Neck supple.  COR S1S2 PULM Clear bilaterally.  LEs no ankle or leg edema noted.  Palpable dp pulses  bilaterally. Sensation intact with monofilament testing in all areas tested, bilaterally.

## 2011-06-25 ENCOUNTER — Encounter: Payer: Self-pay | Admitting: Family Medicine

## 2011-06-25 NOTE — Assessment & Plan Note (Addendum)
BP not optimally controlled today.  It would seem that even with Plendil (out for past 3 days), it is unlikely that his BP is below 150/90, which I believe is a reasonable goal for this gentleman in his mid-70's. He is asked to check blood pressure at home once restarting his Plendil 10mg  daily; if pressures remain above the threshold determined by [place] today (155/90), then he is to double up on Plendil (20mg /day) and return for RN visit to check BP in 2 weeks.  For follow up with me in about 6 weeks.  Am reticent to add beta blocker given his inherently slow pulse.  May consider addition of thiazide diuretic at next check.

## 2011-06-25 NOTE — Assessment & Plan Note (Signed)
Believes tingling in feet may be related to neuropathic etiology.  Will retry gabapentin for this; given impaired GFR (between 30-60, will not escalate dose beyond 900mg /day (advised now to use 300mg /three times daily, beginning with one-time daily dosing and adding an additional 300mg  every 5-7 days until 900/day).

## 2011-07-08 ENCOUNTER — Ambulatory Visit (INDEPENDENT_AMBULATORY_CARE_PROVIDER_SITE_OTHER): Payer: BC Managed Care – PPO | Admitting: Family Medicine

## 2011-07-08 ENCOUNTER — Encounter: Payer: Self-pay | Admitting: Family Medicine

## 2011-07-08 DIAGNOSIS — I10 Essential (primary) hypertension: Secondary | ICD-10-CM

## 2011-07-08 DIAGNOSIS — IMO0002 Reserved for concepts with insufficient information to code with codable children: Secondary | ICD-10-CM

## 2011-07-08 DIAGNOSIS — M999 Biomechanical lesion, unspecified: Secondary | ICD-10-CM

## 2011-07-08 MED ORDER — CLONIDINE HCL 0.2 MG/24HR TD PTWK: 1.0000 | MEDICATED_PATCH | TRANSDERMAL | Status: DC

## 2011-07-08 NOTE — Progress Notes (Signed)
Patient stopped by my office requested BP be rechecked. BP 180/88.

## 2011-07-08 NOTE — Progress Notes (Signed)
  Subjective:    Patient ID: Paul [name], male    DOB: 1935-04-25, 77 y.o.   MRN: [id number]  HPI  Patient is here for followup for his spinal stenosis.  Patient has been getting osteopathic manipulation with some improvement during this time, patient is coming for about one year now.  Pt hashas been doing his home exercises and continuing to strengthen his core. Patient states increasing flexibility of the hip seemed to be the most improvement. Patient denies any new symptoms such as peripheral neuropathy or bowel or bladder problems states most of his pain at this time is on the left side of his back with mild radiculopathy to mid thing and calf.  Patient does have some claudication that occurs on the left lower extremity with prolong activity or sitting. Started recently on Neurontin states that it does make him feel tired but potentially improving his activity during the day. Overall patient thinks that he is not worsening but does not know if these improving drastically either. Review of Systems  Denies fever chills bowel or bladder incontinence or any numbness or weakness in the extremities.   Past medical history, social, surgical and family history all reviewed.   Objective:   Physical Exam  vitals reviewed Gen: NAD, pt balance and gait appear to be fine get up and go test is normal CV: RRR no murmur appreciated Pul: CTAB Ext: no edema 1+ pulses bilaterally 2+ DTR no clonus. Sensation intact Neg SLT       Neg trendelenburg  Significant hamstring tightness bilaterally some improvement from last visit OMT Findings: Cervical:none Thoracic: T4 rotated and side bent left T8 extended rotated and side bent right Lumbar: L 3 rotated and side bent right L5 flexed rotated left Sacrum: Right on right Left posterior ilium    Assessment & Plan:

## 2011-07-08 NOTE — Assessment & Plan Note (Signed)
Still elevated at this time. Patient is given a report that it's been elevated at home as well. We will start clonidine patch have him come back in 3 weeks told patient a potential side effects and when to seek medical attention.

## 2011-07-08 NOTE — Assessment & Plan Note (Signed)
After verbal consent pt did have HVLA, with marked improvement.  Gave side effects to look out for and can take anti inflammatories in the acute time frame.  Continue exercises and stretches F/u in 6 weeks.

## 2011-07-08 NOTE — Patient Instructions (Signed)
Great to see you. Continue doing the exercises and stretches I showed you. Please focus on the core as well as extensions for your back. I'm giving you a new medication for your blood pressure. We are going to start doing clonidine patch. Usually to place this on the skin weekly. Wear for one week and then change I when she come back in 3 weeks to recheck her blood pressure and probably some more manipulation.

## 2011-07-08 NOTE — Assessment & Plan Note (Signed)
After verbal consent pt did have HVLA, with marked improvement.  Gave side effects to look out for and can take anti inflammatories in the acute time frame.  Continue exercises and stretches, RTC in 6 weeks for further manipulation.

## 2011-07-08 NOTE — Assessment & Plan Note (Signed)
Continue current regimen of exercise and stretching, continue OMT PRN.

## 2011-07-29 ENCOUNTER — Encounter: Payer: Self-pay | Admitting: Pharmacist

## 2011-07-29 ENCOUNTER — Ambulatory Visit (INDEPENDENT_AMBULATORY_CARE_PROVIDER_SITE_OTHER): Payer: BC Managed Care – PPO | Admitting: Pharmacist

## 2011-07-29 ENCOUNTER — Ambulatory Visit (INDEPENDENT_AMBULATORY_CARE_PROVIDER_SITE_OTHER): Payer: BC Managed Care – PPO | Admitting: Family Medicine

## 2011-07-29 ENCOUNTER — Encounter: Payer: Self-pay | Admitting: Family Medicine

## 2011-07-29 VITALS — BP 150/82 | HR 60 | Temp 97.4°F | Ht 69.0 in | Wt 194.0 lb

## 2011-07-29 VITALS — BP 162/89 | HR 55

## 2011-07-29 DIAGNOSIS — I10 Essential (primary) hypertension: Secondary | ICD-10-CM

## 2011-07-29 DIAGNOSIS — M5137 Other intervertebral disc degeneration, lumbosacral region: Secondary | ICD-10-CM

## 2011-07-29 NOTE — Progress Notes (Signed)
  Subjective:    Patient ID: Paul [name], male    DOB: 1934/06/01, 77 y.o.   MRN: [id number]  HPI reports pain with walking.  Pain is described as weakness and tingling which occurs primarily after a long day of walking (patient is a golfer).  denies pain starting while at rest or standing still. reports pain worsens when walking up hill or in a hurry. reports pain when walking at an ordinary pace on a level surface at the end of the day.  Reports pain resolves on sitting  Pain is localized to right leg more than the left. .  Patient does report history of MRI with bone spur identified at some time in the past.    He reports that lifting his leg or going into a squat position relieves his pain to so extent.   Review of Systems     Objective:   Physical Exam Lower extremity Physical Exam: Bilaterally cool to palpation, thickened nailbeds. No evidence of delayed capillary refill, absent / diminished pulses, absence of limb hair, thinning of subcutaneous fat, current ulceration, cyanosis, pallor, and edema.  ABI overall = >1  Right Arm 178 mmHg    Left Arm 172 mmHg Right ankle posterior tibial 170 mmHg     dorsalis pedis 185 mmHg Left ankle posterior tibial 182 mmHg    dorsalis pedis 190 mmHg   BP 162/89  Pulse 55     Assessment & Plan:  Normal ABI with possible calcification of vessels and low likelihood of PAD in a patient with symptoms of leg pain with walking.  Results reviewed and written information provided.     Blood pressure remains greater than goal.  Patient willing to set up Amb BP monitor 07/31/2010.   F/U Clinic Visit with Dr. Katrinka Blazing.  Total time in face-to-face counseling 45 minutes.  Patient seen with Tana Conch, PharmD, Pharmacy Resident.

## 2011-07-29 NOTE — Progress Notes (Signed)
  Subjective:    Patient ID: Paul [name], male    DOB: 1935/01/19, 77 y.o.   MRN: [id number]  HPI Reviewed and agree with Dr. Macky Lower management.    Review of Systems     Objective:   Physical Exam        Assessment & Plan:

## 2011-07-29 NOTE — Patient Instructions (Signed)
Patient given verbal instructions and was following up with Dr. Raymondo Band today.

## 2011-07-29 NOTE — Assessment & Plan Note (Signed)
Normal ABI with possible calcification of vessels and low likelihood of PAD in a patient with symptoms of leg pain with walking.  Results reviewed and written information provided.     F/U Clinic Visit with Dr. Katrinka Blazing.  Total time in face-to-face counseling 45 minutes.  Patient seen with Tana Conch, PharmD, Pharmacy Resident.

## 2011-07-29 NOTE — Progress Notes (Signed)
  Subjective:    Patient ID: Paul [name], male    DOB: 1934-07-09, 77 y.o.   MRN: [id number]  HPI 1. Hypertension Blood pressure at home: Patient still says he's normally 160s to 180s systolic and even has had some 100s diastolic Blood pressure today: 150/82 on recheck  Taking Meds: Yes Side effects: No ROS: Denies headache visual changes nausea, vomiting, chest pain or abdominal pain or shortness of breath.  Patient does have some claudication in his legs from time to time and is having ABIs done by Dr. Raymondo Band today.  Review of Systems     Objective:   Physical Exam Gen: NAD, pt balance and gait appear to be fine get up and go test is normal CV: RRR no murmur appreciated Pul: CTAB Ext: no edema 1+ pulses bilaterally 2+ DTR no clonus. Sensation intact    Assessment & Plan:

## 2011-07-29 NOTE — Assessment & Plan Note (Addendum)
Patient is still very difficult to control with his blood pressure. Discussed many different options at this time including increasing his clonidine patch or starting Imdur. Would avoid diuretics secondary to patient's chronic kidney disease Would avoid beta blockers secondary to some bradycardia that he has as well as been very active. Patient is going to followup with Dr. Raymondo Band for ABIs due to claudication as well as potential ambulatory blood pressure monitoring. Patient will followup after this visit and we'll make changes as appropriate. Our goal is to keep her systolic blood pressure less than 160. Would like to attempt further due to patient's chronic kidney disease but do think that this would be too much and might possibly be symptomatic.

## 2011-07-29 NOTE — Patient Instructions (Signed)
Leg Evaluation showed NORMAL blood flow.    Keep up your exercise as tolerated.   Next Visit Thursday AM at 8:30 for ambulatory Blood Pressure evaluation.  April 4th.

## 2011-07-29 NOTE — Assessment & Plan Note (Signed)
Normal ABI with possible calcification of vessels and low likelihood of PAD in a patient with symptoms of leg pain with walking.  Results reviewed and written information provided.     Blood pressure remains greater than goal.  Patient willing to set up Amb BP monitor 07/31/2010.   F/U Clinic Visit with Dr. Katrinka Blazing.  Total time in face-to-face counseling 45 minutes.  Patient seen with Tana Conch, PharmD, Pharmacy Resident.

## 2011-07-31 ENCOUNTER — Encounter: Payer: Self-pay | Admitting: Pharmacist

## 2011-07-31 ENCOUNTER — Ambulatory Visit (INDEPENDENT_AMBULATORY_CARE_PROVIDER_SITE_OTHER): Payer: BC Managed Care – PPO | Admitting: Pharmacist

## 2011-07-31 VITALS — BP 174/92 | Wt 191.6 lb

## 2011-07-31 DIAGNOSIS — I10 Essential (primary) hypertension: Secondary | ICD-10-CM

## 2011-07-31 NOTE — Progress Notes (Signed)
  Subjective:    Patient ID: Paul [name], male    DOB: 04/07/1935, 77 y.o.   MRN: [id number]  HPI 39yoAA male presented to the clinic for 24-hour ambulatory blood pressure monitoring. He has had several elevated blood pressure readings and was switched from plendil/lisinopril to the clonidine patch about 2-3 weeks ago. Denies any issues with his previous blood pressure medications and denies any issues since switching to clonidine. He reports taking his blood pressure at home but questions its accuracy of his monitor. Per Dr. Michaelle Copas note, goal SBP <160. Discussed procedure for wearing the monitor and gave instructions to return to the clinic 08/01/11.   Current antihypertensive Regimen: Clonidine 0.2 mg patch weekly  Previous Antihypertensives: lisinopril, felodipine  Allergies: None   Review of Systems     Objective:   Physical Exam  Previous Office BP readings: 07/29/11: 150/82 mmHg 07/08/11: 180/88 mmHg 06/24/11: 178/80 mmHg  Today's office reading (manual after sitting for several minutes): 174/92 mmHg  24-ABPM Study Data:  Arm Placement left arm   Overall Mean 24hr BP:   181/100 mmHg HR: 54   Daytime Mean BP:     185/104 mmHg HR: 56  Nighttime Mean BP:    173/93 mmHg HR: 50  Dipping Pattern:    -6.5% Systolic, -10.6% Diastolic  [normal dipping ~10-20%]     Assessment & Plan:   77yo male with Stage 2 hypertension not currently controlled on clonidine patch monotherapy based on 24-hour ambulatory blood pressure average of 181/118mmHg. Goal SBP <169mmHg per MD. Patient would benefit from additional drug therapy. Did not have any issues tolerating previous felodipine/lisinopril.  Restarted lisinopril 40mg  daily.  Next follow up with PCP.

## 2011-08-05 ENCOUNTER — Encounter: Payer: Self-pay | Admitting: Home Health Services

## 2011-08-05 ENCOUNTER — Encounter: Payer: Self-pay | Admitting: Family Medicine

## 2011-08-05 ENCOUNTER — Ambulatory Visit (INDEPENDENT_AMBULATORY_CARE_PROVIDER_SITE_OTHER): Payer: BC Managed Care – PPO | Admitting: Family Medicine

## 2011-08-05 VITALS — BP 182/97 | HR 54 | Ht 69.0 in | Wt 189.0 lb

## 2011-08-05 DIAGNOSIS — IMO0002 Reserved for concepts with insufficient information to code with codable children: Secondary | ICD-10-CM

## 2011-08-05 DIAGNOSIS — M999 Biomechanical lesion, unspecified: Secondary | ICD-10-CM

## 2011-08-05 DIAGNOSIS — I10 Essential (primary) hypertension: Secondary | ICD-10-CM

## 2011-08-05 MED ORDER — AMLODIPINE BESYLATE 10 MG PO TABS: 10.0000 mg | ORAL_TABLET | Freq: Every day | ORAL | Status: DC

## 2011-08-05 NOTE — Patient Instructions (Signed)
Patient improved with instructions.

## 2011-08-05 NOTE — Assessment & Plan Note (Signed)
Restarted felodipine, and will consider increasing the clonidine patch  At next visit Continue to monitor at home.

## 2011-08-05 NOTE — Assessment & Plan Note (Signed)
Decision today to treat with OMT was based on Physical Exam  After verbal consent patient was treated with muscle energy and articulatory techniques in lumbar and sacral areas  Patient tolerated the procedure well with improvement in symptoms  Patient given exercises, stretches and lifestyle modifications  See medications in patient instructions if given  Patient will follow up in 4 weeks

## 2011-08-05 NOTE — Progress Notes (Signed)
  Subjective:    Patient ID: Paul [name], male    DOB: Sep 01, 1934, 77 y.o.   MRN: [id number]  HPI 1. Hypertension Blood pressure at home: Did do 24 hour ambulatory blood pressure monitoring which showed systolic blood pressures consistently in the 180s. Blood pressure today: 182/97 Taking Meds: Yes but he has not taken his felodipine Side effects: No ROS: Denies headache visual changes nausea, vomiting, chest pain or abdominal pain or shortness of breath. Patient does state that his right fingertips seem to be a little more numb than usual. This though has been an intermittent problem for months.  Spinal stenosis: Patient has been seen by me for her spinal stenosis or ovary when you're now. She has been improving able to play golf 4-5 times a week with minimal pain. Patient denies any radiation of the pain denies any bowel or bladder incontinence, patient denies also any weakness in the extremities. Patient though still has aches and pains from time to time worse with extension, has been doing home exercise program as well as cycling which has helped.    Review of Systems Denies fever, chills, nausea vomiting abdominal pain, dysuria, chest pain, shortness of breath dyspnea on exertion or numbness in extremities     Objective:   Physical Exam vitals reviewed Gen: NAD, pt balance and gait appear to be fine get up and go test is normal CV: RRR no murmur appreciated Pul: CTAB Ext: no edema 1+ pulses bilaterally 2+ DTR no clonus. Sensation intact Neg SLT       Neg trendelenburg  Significant hamstring tightness bilaterally some improvement from last visit OMT Findings: Cervical:none Thoracic: T4 rotated and side bent left T8 extended rotated and side bent right Lumbar: L 3 rotated and side bent right L5 flexed rotated left Sacrum: Right on right Left posterior ilium     Assessment & Plan:

## 2011-08-05 NOTE — Assessment & Plan Note (Addendum)
Manipulation done today. Please see OMT diagnosis.

## 2011-08-06 NOTE — Assessment & Plan Note (Signed)
77yo male with Stage 2 hypertension not currently controlled on clonidine patch monotherapy based on 24-hour ambulatory blood pressure average of 181/159mmHg. Goal SBP <166mmHg per MD. Patient would benefit from additional drug therapy. Did not have any issues tolerating previous felodipine/lisinopril.  Restarted lisinopril 40mg  daily.  Next follow up with PCP.

## 2011-08-07 NOTE — Progress Notes (Signed)
  Subjective:    Patient ID: Paul [name], male    DOB: 02/16/1935, 76 y.o.   MRN: [id number]  HPI Reviewed and agree with Dr. Koval's management.    Review of Systems     Objective:   Physical Exam        Assessment & Plan:   

## 2011-08-19 ENCOUNTER — Encounter: Payer: Self-pay | Admitting: Pharmacist

## 2011-08-19 ENCOUNTER — Ambulatory Visit (INDEPENDENT_AMBULATORY_CARE_PROVIDER_SITE_OTHER): Payer: BC Managed Care – PPO | Admitting: Pharmacist

## 2011-08-19 VITALS — BP 148/87 | HR 62 | Ht 69.0 in | Wt 187.0 lb

## 2011-08-19 DIAGNOSIS — I10 Essential (primary) hypertension: Secondary | ICD-10-CM

## 2011-08-19 LAB — BASIC METABOLIC PANEL
BUN: 18 mg/dL (ref 6–23)
CO2: 28 mEq/L (ref 19–32)
Calcium: 9.4 mg/dL (ref 8.4–10.5)
Chloride: 103 mEq/L (ref 96–112)
Creat: 1.29 mg/dL (ref 0.50–1.35)
Glucose, Bld: 85 mg/dL (ref 70–99)
Potassium: 4.4 mEq/L (ref 3.5–5.3)
Sodium: 140 mEq/L (ref 135–145)

## 2011-08-19 NOTE — Assessment & Plan Note (Signed)
77yo male with chronic hypertension, currently with improved BP control with SBP in 140s at clinic and also at home, per patient. (SBP goal of <160 per Dr. Michaelle Copas note) Patient currently on clonidine patch, amlodipine, and lisinopril. Patient denies any intolerable side effects from this antihypertensive drug regimen.  1. Continue current antihypertensive regimen of Amlodipine 10mg  PO qHS, Lisinopril 40mg  PO qHS, and Clonidine 0.2 mg patch weekly on Wednesdays. 2. Follow up BMET on ACEi.  40 minutes spent with patient. Patient seen with Ophelia Charter, PharmD Resident.

## 2011-08-19 NOTE — Progress Notes (Signed)
  Subjective:    Patient ID: Paul [name], male    DOB: 1934-08-15, 77 y.o.   MRN: [id number]  HPI 76yoAA male presents to the clinic for HTN management follow-up. Two weeks ago, patient had amlodipine and lisinopril added to his clonidine patch. Subjectively denies any intolerable side effects with his current regimen.   Current antihypertensive Regimen:  Amlodipine 10mg  PO qHS Lisinopril 40mg  PO qHS Clonidine 0.2 mg patch weekly on Wednesdays  Previous Antihypertensives: felodipine Allergies: None   Review of Systems    Objective:   Physical Exam  Today's office reading (manual after sitting for several minutes):  08/19/11: 148/87 mmHg  Previous Office BP readings:  07/29/11: 150/82 mmHg  07/08/11: 180/88 mmHg  06/24/11: 178/80 mmHg   24-ABPM Study Data from early April 2013 prior to medication changes:  Arm Placement left arm  Overall Mean 24hr BP: 181/100 mmHg HR: 54  Daytime Mean BP: 185/104 mmHg HR: 56  Nighttime Mean BP: 173/93 mmHg HR: 50  Dipping Pattern: -6.5% Systolic, -10.6% Diastolic [normal dipping ~10-20%]    Assessment & Plan:  77yo male with chronic hypertension, currently with improved BP control with SBP in 140s at clinic and also at home, per patient. (SBP goal of <160 per Dr. Michaelle Copas note) Patient currently on clonidine patch, amlodipine, and lisinopril. Patient denies any intolerable side effects from this antihypertensive drug regimen.  1. Continue current antihypertensive regimen of Amlodipine 10mg  PO qHS, Lisinopril 40mg  PO qHS, and Clonidine 0.2 mg patch weekly on Wednesdays. 2. Follow up BMET on ACEi.  40 minutes spent with patient. Patient seen with Ophelia Charter, PharmD Resident.

## 2011-08-19 NOTE — Patient Instructions (Signed)
Thanks for coming in today.  Continue same BP meds at this time.  Next visit with Dr. Katrinka Blazing.

## 2011-08-20 NOTE — Progress Notes (Signed)
  Subjective:    Patient ID: Paul [name], male    DOB: 03/17/1935, 76 y.o.   MRN: [id number]  HPI Reviewed and agree with Dr. Koval's management.    Review of Systems     Objective:   Physical Exam        Assessment & Plan:   

## 2011-09-02 ENCOUNTER — Ambulatory Visit: Payer: BC Managed Care – PPO | Admitting: Family Medicine

## 2011-09-18 ENCOUNTER — Ambulatory Visit (INDEPENDENT_AMBULATORY_CARE_PROVIDER_SITE_OTHER): Payer: BC Managed Care – PPO | Admitting: Family Medicine

## 2011-09-18 ENCOUNTER — Encounter: Payer: Self-pay | Admitting: Family Medicine

## 2011-09-18 VITALS — BP 148/78 | HR 68 | Temp 98.4°F | Ht 69.0 in | Wt 181.0 lb

## 2011-09-18 DIAGNOSIS — IMO0002 Reserved for concepts with insufficient information to code with codable children: Secondary | ICD-10-CM

## 2011-09-18 DIAGNOSIS — I10 Essential (primary) hypertension: Secondary | ICD-10-CM

## 2011-09-18 DIAGNOSIS — M999 Biomechanical lesion, unspecified: Secondary | ICD-10-CM

## 2011-09-18 MED ORDER — PRAVASTATIN SODIUM 40 MG PO TABS: 40.0000 mg | ORAL_TABLET | Freq: Every day | ORAL | Status: DC

## 2011-09-18 NOTE — Patient Instructions (Signed)
It is good to see you. Continue exercises and add in the calf raises in your daily regimen. I have changed your simvastatin to pravastatin. This should decrease your risk of having any muscle breakdown. I should see you again in 3-4 weeks.

## 2011-09-18 NOTE — Progress Notes (Signed)
  Subjective:    Patient ID: Paul [name], male    DOB: 05-12-1934, 77 y.o.   MRN: [id number]  HPI  1. Hypertension Blood pressure at home 140s to 150s systolically.  Blood pressure today: 148/78 Taking Meds: Yes  Side effects: No ROS: Denies headache visual changes nausea, vomiting, chest pain or abdominal pain or shortness of breath. Patient does state that his right fingertips seem to be a little more numb than usual. This though has been an intermittent problem for months.  Spinal stenosis: Patient has been seen by me for her spinal stenosis and has manipulation. Has been improving able to play golf 4-5 times a week with minimal pain. Patient denies any radiation of the pain denies any bowel or bladder incontinence, patient denies also any weakness in the extremities. Patient though still has aches and pains from time to time worse with extension, has been doing home exercise program as well as cycling which has helped. Patient has been working as core strength which is help tremendously with his balance as well.    Review of Systems  Denies fever, chills, nausea vomiting abdominal pain, dysuria, chest pain, shortness of breath dyspnea on exertion or numbness in extremities     Objective:   Physical Exam  vitals reviewed Gen: NAD, pt balance and gait appear to be fine get up and go test is normal CV: RRR no murmur appreciated Pul: CTAB Ext: no edema 1+ pulses bilaterally 2+ DTR no clonus. Sensation intact Neg SLT       Neg trendelenburg  Significant hamstring tightness bilaterally some improvement from last visit OMT Findings: Cervical: C4 flexed rotated and side bent right. Thoracic: T4 rotated and side bent left T8 extended rotated and side bent right Lumbar: L 3 rotated and side bent left Sacrum: Right on right Left posterior ilium     Assessment & Plan:

## 2011-09-18 NOTE — Assessment & Plan Note (Signed)
Continue current OMT. After verbal consent patient was treated with HVLA with marked improvement. Encourage patient to continue further exercise. Patient wanted to know if there's been any advancement and surgical surgeries at this time. Discussed with patient stated that at this time I did not know of any would check with Dr. Azucena Cecil in the next week or so.

## 2011-09-18 NOTE — Assessment & Plan Note (Signed)
Decision today to treat with OMT was based on Physical Exam  After verbal consent patient was treated with HVLA and ME techniques in thoracolumbar and sacral areas  Patient tolerated the procedure well with improvement in symptoms  Patient given exercises, stretches and lifestyle modifications  See medications in patient instructions if given  Patient will follow up in 3-4 weeks

## 2011-09-18 NOTE — Assessment & Plan Note (Signed)
Patient is at goal at this time. Do not think further lowering would be beneficial and actually potentially increase his risk of falls and hypotensive events. Continue current therapy patient is not having any side effects.

## 2011-10-15 ENCOUNTER — Ambulatory Visit: Payer: BC Managed Care – PPO | Admitting: Sports Medicine

## 2011-10-17 ENCOUNTER — Ambulatory Visit (INDEPENDENT_AMBULATORY_CARE_PROVIDER_SITE_OTHER): Payer: BC Managed Care – PPO | Admitting: Family Medicine

## 2011-10-17 ENCOUNTER — Encounter: Payer: Self-pay | Admitting: Family Medicine

## 2011-10-17 VITALS — BP 174/81 | HR 69 | Temp 97.5°F | Ht 69.0 in | Wt 183.0 lb

## 2011-10-17 DIAGNOSIS — IMO0002 Reserved for concepts with insufficient information to code with codable children: Secondary | ICD-10-CM

## 2011-10-17 DIAGNOSIS — M999 Biomechanical lesion, unspecified: Secondary | ICD-10-CM

## 2011-10-17 DIAGNOSIS — I10 Essential (primary) hypertension: Secondary | ICD-10-CM

## 2011-10-17 MED ORDER — CLONIDINE HCL 0.3 MG/24HR TD PTWK: 1.0000 | MEDICATED_PATCH | TRANSDERMAL | Status: DC

## 2011-10-17 NOTE — Assessment & Plan Note (Signed)
Decision today to treat with OMT was based on Physical Exam  After verbal consent patient was treated with articular techniques in lumbosacral areas  Patient tolerated the procedure well with improvement in symptoms  Patient given exercises, stretches and lifestyle modifications  See medications in patient instructions if given  Patient will follow up in 4 weeks

## 2011-10-17 NOTE — Assessment & Plan Note (Signed)
Continue OMT Given more exercises to help with the imbalance between muscle groups.  F/u in 4 weeks.

## 2011-10-17 NOTE — Progress Notes (Signed)
Patient ID: Paul [name], male   DOB: October 22, 1934, 77 y.o.   MRN: [id number]  Subjective:    Patient ID: Paul [name], male    DOB: [birth year], 77 y.o.   MRN: [id number]  HPI 1. Hypertension Blood pressure at home been increasing to 160 to 170 SBP Blood pressure today: 168/82 on recheck. Taking Meds: Yes  Side effects: No ROS: Denies headache visual changes nausea, vomiting, chest pain or abdominal pain or shortness of breath. Patient does state that his right fingertips seem to be a little more numb than usual. This though has been an intermittent problem for months.  Spinal stenosis: Patient has been seen by me for her spinal stenosis and has manipulation. Has been improving able to play golf 4-5 times a week with minimal pain. Patient denies any radiation of the pain denies any bowel or bladder incontinence, patient denies also any weakness in the extremities. Patient though still has aches and pains from time to time worse with extension, has been doing home exercise program as well as cycling which has helped. Patient has been working as core strength which is help tremendously with his balance as well.  Patient has some tightness of the lower back after carrying two 25 lbs bags about 1/2 mile but overall doing well.     Review of Systems Denies fever, chills, nausea vomiting abdominal pain, dysuria, chest pain, shortness of breath dyspnea on exertion or numbness in extremities     Objective:   Physical Exam vitals reviewed Gen: NAD, pt balance and gait appear to be fine get up and go test is normal CV: RRR no murmur appreciated Pul: CTAB Ext: no edema 1+ pulses bilaterally 2+ DTR no clonus. Sensation intact Neg SLT       Neg trendelenburg  Significant hamstring tightness bilaterally some improvement from last visit OMT Findings: Cervical: neutral Thoracic: T4 rotated and side bent left T8 extended rotated and side bent right Lumbar: L 3 rotated and side bent left Sacrum: Right on  right    Assessment & Plan:

## 2011-10-17 NOTE — Assessment & Plan Note (Signed)
Still not controlled increase patient's clonidine patch 0.3 mg we'll see if patient responds well. Likely would still of Foley beta blockers as next step would consider more of hydralazine if necessary.

## 2011-10-17 NOTE — Patient Instructions (Addendum)
Do your exercises and stretches. It is a pleasure getting to know you over the years and I will see you down in sports medicine for now for your manipulations. Lets make an appointment in 4 weeks I am also increasing your clonidine to 0.3mg  patch.  Starting this Wednesday please try a patch and a half.

## 2011-10-20 ENCOUNTER — Ambulatory Visit (INDEPENDENT_AMBULATORY_CARE_PROVIDER_SITE_OTHER): Payer: BC Managed Care – PPO | Admitting: Sports Medicine

## 2011-10-20 VITALS — BP 130/80

## 2011-10-20 DIAGNOSIS — M19049 Primary osteoarthritis, unspecified hand: Secondary | ICD-10-CM

## 2011-10-20 DIAGNOSIS — M25539 Pain in unspecified wrist: Secondary | ICD-10-CM

## 2011-10-20 DIAGNOSIS — G8929 Other chronic pain: Secondary | ICD-10-CM | POA: Insufficient documentation

## 2011-10-20 MED ORDER — DICLOFENAC SODIUM 1 % TD GEL: TRANSDERMAL | Status: DC

## 2011-10-20 NOTE — Patient Instructions (Addendum)
1. Use the Voltaren gel daily.  It is for inflammation.  2. Use your wrist loop with activity for your comfort.  3. Follow up in one month.

## 2011-10-21 DIAGNOSIS — M19049 Primary osteoarthritis, unspecified hand: Secondary | ICD-10-CM | POA: Insufficient documentation

## 2011-10-21 NOTE — Assessment & Plan Note (Signed)
Voltaren gel Thumb loop with activity Follow up in one month

## 2011-10-21 NOTE — Progress Notes (Signed)
  Subjective:    Patient ID: Paul [name], male    DOB: June 15, 1934, 77 y.o.   MRN: [id number]  HPI 77 y/o male is here c/o intermittent pain and swelling of his right wrist.The pain is sharp and last seconds to minutes.  It comes with certain movements but he cannot anticipate it.  Sometimes he has aching in the wrist.  There has been no injury.  He tried using biofreeze and this seemed to help.   Review of Systems     Objective:   Physical Exam  Pain to palpation of the [facility] joint The hand is otherwise non-tender to palpation Normal grip Wrist has normal motion No swelling or erythema.  Ultrasound: calcified meniscus and spurring at the [facility].  Mild joint effusion.      Assessment & Plan:

## 2011-11-11 ENCOUNTER — Ambulatory Visit: Payer: BC Managed Care – PPO | Admitting: Sports Medicine

## 2011-11-18 ENCOUNTER — Ambulatory Visit: Payer: BC Managed Care – PPO | Admitting: Family Medicine

## 2011-11-19 ENCOUNTER — Ambulatory Visit (INDEPENDENT_AMBULATORY_CARE_PROVIDER_SITE_OTHER): Payer: BC Managed Care – PPO | Admitting: Sports Medicine

## 2011-11-19 VITALS — BP 143/84

## 2011-11-19 DIAGNOSIS — M19049 Primary osteoarthritis, unspecified hand: Secondary | ICD-10-CM

## 2011-11-19 DIAGNOSIS — M999 Biomechanical lesion, unspecified: Secondary | ICD-10-CM

## 2011-11-19 DIAGNOSIS — IMO0002 Reserved for concepts with insufficient information to code with codable children: Secondary | ICD-10-CM

## 2011-11-19 MED ORDER — CLONIDINE HCL 0.3 MG/24HR TD PTWK: 1.0000 | MEDICATED_PATCH | TRANSDERMAL | Status: DC

## 2011-11-19 NOTE — Assessment & Plan Note (Signed)
Patient is still getting osteopathic manipulation therapy. Patient did have manipulation done today with significant improvement. Please see under nonallopathic problems for further detail. Patient will continue to be followed with manipulative medicine every 3-6 weeks. This has seemed to help decrease the progression of the problem and patient is not having any red flags.

## 2011-11-19 NOTE — Assessment & Plan Note (Signed)
Decision today to treat with OMT was based on Physical Exam  After verbal consent patient was treated with HVLA and ME techniques in lumbosacral areas  Patient tolerated the procedure well with improvement in symptoms  Patient given exercises, stretches and lifestyle modifications  See medications in patient instructions if given  Patient will follow up in 3-6 weeks

## 2011-11-19 NOTE — Assessment & Plan Note (Signed)
Left-sided. Patient also has a very mild tenosynovitis secondary to the arthritis he is having at that time. On ultrasound today did not show any further problems or any increasing in swelling or any bone chips that can be causing worsening problems. Patient will continue the Voltaren gel as well as some hot/warm baths of the hand where he will do flexion extension abduction and adduction exercises to try to help. Patient will return in 3-6 weeks for manipulation as well as for followup for his arthritis pain. At this time patient though states the pain is not impeding any of his activities of daily living.

## 2011-11-19 NOTE — Progress Notes (Signed)
Subjective: Patient is here for followup of his [facility] arthritis pain. Patient states that it has improved minorly. Patient still says time to time he has the pain and stretches with certain activities but he does not know which ones. Patient has been doing more activities with his hand because he is moving into a new house. Patient has been caring boxes which might have exacerbated this problem. Patient has been using the Voltaren gel which has been somewhat beneficial but overall not making too much changes. Patient denies any numbness or weakness in that hand also denies much swelling in still able to do all activities of daily living.  Patient is also here for manipulation therapy for his spinal stenosis. Patient states that he is continuing to do the core exercises as well as continuing to stay physically active which has helped considerably. Patient though it has been a while since she's had manipulation in with a moving recently he's been having a little bit more back problems. Patient states some mild radiculopathy but overall still doing very well. Patient denies any weakness in the legs or any numbness. Patient also denies any bowel or bladder incontinence. Patient is still able to play golf 3-4 times a week.   Hypertension Blood pressure at home: 140-150 systolic Blood pressure today: 143/84 Taking Meds: Yes but he needs refill on his 0.3 clonidine patch Side effects: No ROS: Denies headache visual changes nausea, vomiting, chest pain or abdominal pain or shortness of breath.  Physical exam: Filed Vitals:   11/19/11 0917  BP: 143/84  Pain to palpation of the [facility] joint The hand is otherwise non-tender to palpation Normal grip Wrist has normal motion No swelling or erythema. CV: RRR no murmur appreciated  Pul: CTAB  Ext: no edema 1+ pulses bilaterally 2+ DTR no clonus. Sensation intact  Neg SLT Neg trendelenburg   OMT Findings:  Cervical: neutral  Thoracic: T4 rotated and side bent  left T8 extended rotated and side bent right  Lumbar: L 3 rotated and side bent left  Sacrum: Right on right  Ultrasound: calcified meniscus and spurring at the [facility].  Mild joint effusion no significant change from last time.

## 2011-11-20 ENCOUNTER — Ambulatory Visit: Payer: BC Managed Care – PPO | Admitting: Sports Medicine

## 2011-11-24 ENCOUNTER — Other Ambulatory Visit: Payer: Self-pay | Admitting: Family Medicine

## 2011-12-16 ENCOUNTER — Ambulatory Visit (INDEPENDENT_AMBULATORY_CARE_PROVIDER_SITE_OTHER): Payer: BC Managed Care – PPO | Admitting: Family Medicine

## 2011-12-16 ENCOUNTER — Encounter: Payer: Self-pay | Admitting: Family Medicine

## 2011-12-16 VITALS — BP 138/80 | HR 48 | Ht 69.0 in | Wt 183.0 lb

## 2011-12-16 DIAGNOSIS — IMO0002 Reserved for concepts with insufficient information to code with codable children: Secondary | ICD-10-CM

## 2011-12-16 DIAGNOSIS — M19049 Primary osteoarthritis, unspecified hand: Secondary | ICD-10-CM

## 2011-12-16 DIAGNOSIS — M999 Biomechanical lesion, unspecified: Secondary | ICD-10-CM

## 2011-12-16 DIAGNOSIS — I10 Essential (primary) hypertension: Secondary | ICD-10-CM

## 2011-12-16 MED ORDER — CYCLOBENZAPRINE HCL 5 MG PO TABS: 5.0000 mg | ORAL_TABLET | Freq: Every evening | ORAL | Status: AC | PRN

## 2011-12-16 MED ORDER — POLYETHYLENE GLYCOL 3350 17 GM/SCOOP PO POWD: 17.0000 g | Freq: Two times a day (BID) | ORAL | Status: DC

## 2011-12-16 NOTE — Progress Notes (Signed)
Subjective: Patient is here for followup of his [facility] arthritis pain.patient states that he has had responses to the voltaren gel. Patient states that he is able to do all activities of daily living without any impedance. Patient states that the pain is not waking him up at night. Patient states overall he is showing improvement. Patient has been able to play golfwithout any trouble.  Patient is also here for manipulation therapy for his spinal stenosis. Patient states that he is continuing to do the core exercises as well as continuing to stay physically active which has helped considerably.patient states that he has noticed a little bit more of leg pain more frequently than usual. Patient knows that he does have moderate to severe spinal stenosis but overall seems to be improving. Patient states some mild radiculopathy but overall still doing very well. Patient denies any weakness in the legs or any numbness. Patient also denies any bowel or bladder incontinence. Patient is still able to play golf 3-4 times a week.   Hypertension Blood pressure at home: 140-150 systolic Blood pressure today: 170 systolic but recheck  143/84 Taking Meds: Yes but he needs refill on his 0.3 clonidine patch Side effects: No ROS: Denies headache visual changes nausea, vomiting, chest pain or abdominal pain or shortness of breath.  Physical exam: Filed Vitals:   12/16/11 1337  BP: 138/80  Pulse: 48  Pain to palpation of the [facility] joint but improved. The hand is otherwise non-tender to palpation Normal grip Wrist has normal motion No swelling or erythema. CV: RRR no murmur appreciated  Pul: CTAB  Ext: no edema 1+ pulses bilaterally 2+ DTR no clonus. Sensation intact  Neg SLT Neg trendelenburg   OMT Findings:  Cervical: neutral  Thoracic: T4 rotated and side bent left T8 extended rotated and side bent right  Lumbar: neutral Sacrum: Right on right

## 2011-12-16 NOTE — Assessment & Plan Note (Signed)
Improving slowly at this time. If it seems to be getting worse at any point will consider doing a steroid injection. Patient will continue doing the warm water baths as well as the exercises. Followup as needed.

## 2011-12-16 NOTE — Patient Instructions (Addendum)
Try muscle relaxant at night Come back and see me again in 4-6 weeks.  Keep checking your blood pressure.

## 2011-12-16 NOTE — Assessment & Plan Note (Signed)
Decision today to treat with OMT was based on Physical Exam  After verbal consent patient was treated with HVLA and ME techniques in sacral and thoracic areas  Patient tolerated the procedure well with improvement in symptoms  Patient given exercises, stretches and lifestyle modifications  See medications in patient instructions if given  Patient will follow up in 4-6 weeks  Patient was given a muscle relaxer to try at night to help with the spasms that he is having likely secondary to his spinal stenosis.

## 2011-12-16 NOTE — Assessment & Plan Note (Signed)
Patient still is responding to osteopathic manipulation. Patient will continue doing the exercises as much as can as well as with the stretching. Patient knows that if he starts having worsening radiculopathy that he needs to come in and be evaluated further. It has been quite amount of time since we've had any imaging done. If we get worsening symptoms we will then consider MRI. Patient will return in 4-6 weeks for further manipulation.

## 2011-12-16 NOTE — Assessment & Plan Note (Signed)
We'll continue to monitor. We'll not change any medications at this time.

## 2012-01-06 ENCOUNTER — Encounter: Payer: Self-pay | Admitting: Family Medicine

## 2012-01-06 ENCOUNTER — Ambulatory Visit (INDEPENDENT_AMBULATORY_CARE_PROVIDER_SITE_OTHER): Payer: BC Managed Care – PPO | Admitting: Family Medicine

## 2012-01-06 VITALS — BP 154/84 | Ht 69.0 in | Wt 183.0 lb

## 2012-01-06 DIAGNOSIS — M25539 Pain in unspecified wrist: Secondary | ICD-10-CM

## 2012-01-06 DIAGNOSIS — M25531 Pain in right wrist: Secondary | ICD-10-CM

## 2012-01-06 MED ORDER — KETOROLAC TROMETHAMINE 60 MG/2ML IM SOLN
30.0000 mg | Freq: Once | INTRAMUSCULAR | Status: AC
  Administered 2012-01-06: 30 mg via INTRAMUSCULAR

## 2012-01-06 NOTE — Assessment & Plan Note (Signed)
Patient appears to have a tenosynovitis of the extensor compartment of his wrist. Likely it seems to be most consistent with the extensor carpi radialis. Patient was given a shot of 30 mg of Toradol today. In addition to this he'll do topical Voltaren gel. We will avoid systemic anti-inflammatories on a regular basis secondary to his hypertension and some chronic renal insufficiency. Patient will try these in followup in one to 2 weeks if not completely better. Patient will also decrease the amount of weights that he was doing previously.

## 2012-01-06 NOTE — Patient Instructions (Signed)
Good to see you.  I am sorry for your finger. I think it is a tenosynovitis.  Try the voltaren gel. Use it 2 times a day Ice 20 minutes three times a day  If not better in 1-2 weeks come on back but I expect improvement in 3-5 days.

## 2012-01-06 NOTE — Progress Notes (Signed)
Chief complaint: Right hand pain Subjective Patient is here with acute onset of right hand pain. Patient states that this is lasting for approximately 1 day. Yesterday he did do some exercises with heavier weights than usual. Patient had been using 5 pounds but then increased 8 pounds the other day. Patient states that he has pain on the dorsal aspect of the hand worse with extension. Patient denies any numbness in his fingertips. Patient has tried some anti-inflammatories with no improvement. Patient states that it is stopping him from playing golf which is a normal activity of daily living for him. Patient has even had trouble dressing himself secondary to the pain that he was having. Patient denies any type injury, any type of fall or anything that could potentially cause it otherwise. Patient denies any swelling of or any weakness of the fingers.  Physical exam Filed Vitals:   01/06/12 1421  BP: 154/84   General: No apparent distress Right hand exam: Patient is tender to palpation over the extensor carpi ulnaris tendon on the dorsal aspect of the hand, patient does not have any pinpoint tenderness over the anatomical snuff box or navicular bone. Patient has great range of motion and ulnar radial deviation as well as flexion but is resisted somewhat in extension secondary to pain.  Patient did have ultrasound exam that was performed and interpreted by me today. Patient had significant hypoechoic changes surrounding the extensor tendons mostly of the extensor carpi radialis tendon. No true tear appreciated.

## 2012-01-20 ENCOUNTER — Ambulatory Visit (INDEPENDENT_AMBULATORY_CARE_PROVIDER_SITE_OTHER): Payer: BC Managed Care – PPO | Admitting: Family Medicine

## 2012-01-20 ENCOUNTER — Ambulatory Visit: Payer: BC Managed Care – PPO | Admitting: Family Medicine

## 2012-01-20 VITALS — BP 134/80 | Ht 69.0 in | Wt 183.0 lb

## 2012-01-20 DIAGNOSIS — IMO0002 Reserved for concepts with insufficient information to code with codable children: Secondary | ICD-10-CM

## 2012-01-20 DIAGNOSIS — M999 Biomechanical lesion, unspecified: Secondary | ICD-10-CM

## 2012-01-20 NOTE — Assessment & Plan Note (Signed)
Patient continues to respond very well to osteopathic manipulative therapy. Patient knows though that spinal stenosis likely progress in the long run. Patient though has been very stable for the last 2 and half years. At this point we did not feel that any further imaging is necessary. If for some reason patient does have worsening exacerbation of pain they would get x-rays likely followed by MRI or neurosurgical consult. Patient is red flags and when to seek medical attention. Patient will return again in 4-6 weeks for manipulation therapy.

## 2012-01-20 NOTE — Assessment & Plan Note (Signed)
Decision today to treat with OMT was based on Physical Exam  After verbal consent patient was treated with HVLA and ME techniques in lumbosacral areas  Patient tolerated the procedure well with improvement in symptoms  Patient given exercises, stretches and lifestyle modifications  See medications in patient instructions if given  Patient will follow up in 4-6 weeks  

## 2012-01-20 NOTE — Progress Notes (Signed)
Subjective: Patient is also here for manipulation therapy for his spinal stenosis. Patient states that he is continuing to do the core exercises as well as continuing to stay physically active which has helped considerably.patient states that he has noticed a little bit more of leg pain more frequently than usual. Patient knows that he does have moderate to severe spinal stenosis but overall seems to be stable. Patient states some mild radiculopathy but overall still doing very well. Patient denies any weakness in the legs or any numbness. Patient also denies any bowel or bladder incontinence. 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 No fever or chills, no bowel or bladder incontinence.     Physical exam: Filed Vitals:   01/20/12 1348  BP: 134/80   CV: RRR no murmur appreciated  Pul: CTAB  Ext: no edema 1+ pulses bilaterally 2+ DTR no clonus. Sensation intact  Neg SLT Neg trendelenburg   OMT Findings:  Cervical: neutral  Thoracic: T5E rotated and side bent right T8 extended rotated and side bent right  Lumbar: L2 F RS left Sacrum: Right on right

## 2012-02-03 ENCOUNTER — Ambulatory Visit: Payer: BC Managed Care – PPO | Admitting: Family Medicine

## 2012-02-17 ENCOUNTER — Ambulatory Visit (INDEPENDENT_AMBULATORY_CARE_PROVIDER_SITE_OTHER): Payer: BC Managed Care – PPO | Admitting: Family Medicine

## 2012-02-17 VITALS — BP 142/80 | Ht 69.0 in | Wt 180.4 lb

## 2012-02-17 DIAGNOSIS — M999 Biomechanical lesion, unspecified: Secondary | ICD-10-CM

## 2012-02-17 DIAGNOSIS — R634 Abnormal weight loss: Secondary | ICD-10-CM

## 2012-02-17 NOTE — Progress Notes (Signed)
Subjective: Patient is also here for manipulation therapy for his spinal stenosis. Patient states that he is continuing to do the core exercises as well as continuing to stay physically active which has helped considerably.  Patient recently though has had some mild aching of his lower extremities bilaterally after increasing his leg extensions. Patient states that he is on a machine he does seem to extend his back more than neutral position. He feels that this is likely reason is occurring. Patient has decreased his weights again and has noticed improvement. Patient denies any weakness in the legs or any numbness. Patient also denies any bowel or bladder incontinence. 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 No fever or chills, no bowel or bladder incontinence.     Physical exam: Filed Vitals:   02/17/12 1329  BP: 142/80   Filed Weights   02/17/12 1329  Weight: 180 lb 6.4 oz (81.829 kg)    CV: RRR no murmur appreciated  Pul: CTAB  Ext: no edema 1+ pulses bilaterally 2+ DTR no clonus. Sensation intact  Neg SLT Neg trendelenburg   OMT Findings:  Cervical: neutral  Thoracic: T5E rotated and side bent right  T8 extended rotated and side bent right  Lumbar: L2 F RS left,  Sacrum: Right on right

## 2012-02-17 NOTE — Assessment & Plan Note (Signed)
Decision today to treat with OMT was based on Physical Exam  After verbal consent patient was treated with ME and artciular techniques in lumbarsacral areas  Patient tolerated the procedure well with improvement in symptoms  Patient given exercises, stretches and lifestyle modifications  See medications in patient instructions if given  Patient will follow up in 4-6 weeks

## 2012-02-17 NOTE — Assessment & Plan Note (Signed)
To do this patient did have a good amount of weight loss over the course of time. Patient has lost a total of 13 pounds over the last 7 months. Patient states that he has changed his diet and has noticed a significant improvement. Patient denies any fevers or chills. Though does make aware make sure he does not continue to lose weight.

## 2012-03-23 ENCOUNTER — Ambulatory Visit (INDEPENDENT_AMBULATORY_CARE_PROVIDER_SITE_OTHER): Payer: BC Managed Care – PPO | Admitting: Family Medicine

## 2012-03-23 ENCOUNTER — Encounter: Payer: Self-pay | Admitting: Family Medicine

## 2012-03-23 VITALS — BP 176/86 | Ht 69.0 in | Wt 183.0 lb

## 2012-03-23 DIAGNOSIS — IMO0002 Reserved for concepts with insufficient information to code with codable children: Secondary | ICD-10-CM

## 2012-03-23 DIAGNOSIS — I10 Essential (primary) hypertension: Secondary | ICD-10-CM

## 2012-03-23 DIAGNOSIS — M999 Biomechanical lesion, unspecified: Secondary | ICD-10-CM

## 2012-03-23 MED ORDER — FELODIPINE ER 10 MG PO TB24: 10.0000 mg | ORAL_TABLET | Freq: Every day | ORAL | Status: DC

## 2012-03-23 NOTE — Assessment & Plan Note (Signed)
Patient does have back pain that is secondary to spinal stenosis. Patient though is doing very well and is tolerated well. We did discuss for quite some time about the potential of reimaging him with an MRI. Discussed with him though that unless he was thinking surgical intervention was a possibility of an not do this. Patient agreed in the long run and said he would call me if he has any exacerbation of pain and we will consider redoing the MRI.

## 2012-03-23 NOTE — Assessment & Plan Note (Signed)
Decision today to treat with OMT was based on Physical Exam  After verbal consent patient was treated with ME and articular techniques in lumbar and sacral areas  Patient tolerated the procedure well with improvement in symptoms  Patient given exercises, stretches and lifestyle modifications  See medications in patient instructions if given  Patient will follow up in 6-8 weeks

## 2012-03-23 NOTE — Progress Notes (Signed)
Subjective: Patient is also here for manipulation therapy for his spinal stenosis. Patient states that he is continuing to do the core exercises as well as continuing to stay physically active which has helped considerably.  Patient has joined [employer] and has been doing any more structured workout. Patient states that this is been doing very good but does bring in his workout assignments to have them reviewed to see if there's anything he should avoid. Patient did state that back extension did seem to exacerbate his pain.  Patient denies any weakness in the legs or any numbness. Patient also denies any bowel or bladder incontinence. 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 No fever or chills, no bowel or bladder incontinence.    Hypertension Blood pressure at home:not been checking Blood pressure today: 176/86 Taking Meds:his and has run out of his felodipine Side effects:no ROS: Denies headache visual changes nausea, vomiting, chest pain or abdominal pain or shortness of breath.  Physical exam: Filed Vitals:   03/23/12 1355  BP: 176/86   Filed Weights   03/23/12 1355  Weight: 183 lb (83.008 kg)    CV: RRR no murmur appreciated  Pul: CTAB  Ext: no edema 1+ pulses bilaterally 2+ DTR no clonus. Sensation intact  Neg SLT Neg trendelenburg   OMT Findings:  Cervical: neutral  Thoracic: T5E rotated and side bent right  T8 extended rotated and side bent right  Lumbar: L2 F RS left,  Sacrum: Right on right

## 2012-03-23 NOTE — Assessment & Plan Note (Signed)
Patient has been very difficult to control over the years. Patient is going to go see his primary care provider and have any changes made. Patient was not taking his felodipine and this was refilled today. Hopefully by that time his his primary care this is under control. Encourage him to keep a daily log of his blood pressures. If patient is unable to see his primary care physician he'll come back and we will reevaluate.  Of note patient is not on hydrochlorothiazide secondary to a history of gout.

## 2012-04-07 ENCOUNTER — Ambulatory Visit (INDEPENDENT_AMBULATORY_CARE_PROVIDER_SITE_OTHER): Payer: BC Managed Care – PPO | Admitting: Family Medicine

## 2012-04-07 ENCOUNTER — Encounter: Payer: Self-pay | Admitting: Family Medicine

## 2012-04-07 VITALS — BP 176/82 | HR 51 | Ht 69.0 in | Wt 179.0 lb

## 2012-04-07 DIAGNOSIS — E785 Hyperlipidemia, unspecified: Secondary | ICD-10-CM

## 2012-04-07 DIAGNOSIS — R634 Abnormal weight loss: Secondary | ICD-10-CM

## 2012-04-07 DIAGNOSIS — Z23 Encounter for immunization: Secondary | ICD-10-CM

## 2012-04-07 DIAGNOSIS — I10 Essential (primary) hypertension: Secondary | ICD-10-CM

## 2012-04-07 LAB — LIPID PANEL
Cholesterol: 202 mg/dL — ABNORMAL HIGH (ref 0–200)
HDL: 73 mg/dL (ref >39)
LDL Cholesterol: 117 mg/dL — ABNORMAL HIGH (ref 0–99)
Total CHOL/HDL Ratio: 2.8 Ratio
Triglycerides: 59 mg/dL (ref <150)
VLDL: 12 mg/dL (ref 0–40)

## 2012-04-07 LAB — COMPREHENSIVE METABOLIC PANEL
ALT: 12 U/L (ref 0–53)
AST: 18 U/L (ref 0–37)
Albumin: 4.1 g/dL (ref 3.5–5.2)
Alkaline Phosphatase: 47 U/L (ref 39–117)
BUN: 19 mg/dL (ref 6–23)
CO2: 30 mEq/L (ref 19–32)
Calcium: 9.3 mg/dL (ref 8.4–10.5)
Chloride: 101 mEq/L (ref 96–112)
Creat: 1.39 mg/dL — ABNORMAL HIGH (ref 0.50–1.35)
Glucose, Bld: 94 mg/dL (ref 70–99)
Potassium: 4.1 mEq/L (ref 3.5–5.3)
Sodium: 142 mEq/L (ref 135–145)
Total Bilirubin: 0.5 mg/dL (ref 0.3–1.2)
Total Protein: 7.5 g/dL (ref 6.0–8.3)

## 2012-04-07 LAB — CBC WITH DIFFERENTIAL/PLATELET
Basophils Absolute: 0 10*3/uL (ref 0.0–0.1)
Basophils Relative: 1 % (ref 0–1)
Eosinophils Absolute: 0.1 10*3/uL (ref 0.0–0.7)
Eosinophils Relative: 2 % (ref 0–5)
HCT: 42.6 % (ref 39.0–52.0)
Hemoglobin: 14.4 g/dL (ref 13.0–17.0)
Lymphocytes Relative: 46 % (ref 12–46)
Lymphs Abs: 3.2 10*3/uL (ref 0.7–4.0)
MCH: 27.3 pg (ref 26.0–34.0)
MCHC: 33.8 g/dL (ref 30.0–36.0)
MCV: 80.8 fL (ref 78.0–100.0)
Monocytes Absolute: 0.6 10*3/uL (ref 0.1–1.0)
Monocytes Relative: 8 % (ref 3–12)
Neutro Abs: 3 10*3/uL (ref 1.7–7.7)
Neutrophils Relative %: 43 % (ref 43–77)
Platelets: 152 10*3/uL (ref 150–400)
RBC: 5.27 MIL/uL (ref 4.22–5.81)
RDW: 16.1 % — ABNORMAL HIGH (ref 11.5–15.5)
WBC: 7 10*3/uL (ref 4.0–10.5)

## 2012-04-07 LAB — TSH: TSH: 5.112 u[IU]/mL — ABNORMAL HIGH (ref 0.350–4.500)

## 2012-04-07 LAB — HIV ANTIBODY (ROUTINE TESTING W REFLEX): HIV: NONREACTIVE

## 2012-04-07 NOTE — Patient Instructions (Addendum)
It was a pleasure to see you today.    Regarding your blood pressure, I ask that you take 2 tablets of your felodipine (Plendil) 10mg  (ie, take 20 mg daily) and come back for a nurse visit in the next 2-4 weeks.Marland Kitchen PLEASE BRING YOUR HOME BP CUFF TO CALIBRATE IT WITH OUR MANUAL CUFF.  We will decide if further medications are needed based on your readings after taking the increased dose of Plendil for at least 2 weeks.   I will contact you with the results of your labs that are being done today.   Flu shot today.

## 2012-04-07 NOTE — Assessment & Plan Note (Signed)
Plan to increase felodipine to max dose, recheck BP with nurse clinic in the coming 2-3 weeks.  Also, to check BMet for renal function.

## 2012-04-07 NOTE — Progress Notes (Signed)
  Subjective:    Patient ID: Paul [name], male    DOB: January 02, 1935, 77 y.o.   MRN: [id number]  HPI Here for follow up of elevated blood pressure. Feels well.  [facility] of his back pain and spinal stenosis, is maintaining activity and this seems to help.   Reports weight loss associated with 6 month change in diet, large breakfast and small lunch, skips dinner and snacks on fruit and cheese instead.  One glass wine at dinner time.  Goal was to get to 185#, is now at 179#.  Review of Systems  No chest pain, no dyspnea, has had some mild constipation without blood in stool, easily relieved with Miralax; no abd pain, no swelling, no fevers or chills.  Had TURP and sees urology annually, no problems with urinary flow.  Back pain with radiating tingling to both feet, stable and unchanged. S/p knee replacement surgery with a transfusion 4 yrs ago, requests HIV test for concerns about acquiring HIV during the transfusion. Due for flu shot. No falls in the past year.   Reviewed all preventive interventions, he is up to date on colon cancer screening.   SOcial Hx; Quit smoking 1974; one glass wine per night.  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  Plans to become a [employer] in his retirement.      Objective:   Physical Exam Well appearing, no apparent distress HEENT Neck supple. Thyroid supple, no cervical adenopathy. Clear oropharynx. COR Regular S1S2, no extra sounds PULM Clear bilaterally, no rales or wheezes.  LEs,. No edema in ankles bilaterally.        Assessment & Plan:

## 2012-04-07 NOTE — Assessment & Plan Note (Signed)
Appears to be a healthy weight loss, that is associated with definitive dietary changes he made about 6 months ago.  He describes his energy level as positive, maintains physical and mental activity.  Had a borderline TSH a couple of yrs ago, will recheck this along with a CBC.  He requests an HIV test to be done as well, concerns about blood tx he received during knee replacement surgery about 4 yrs ago.  I explained that I will phone him with these results when available.

## 2012-04-08 ENCOUNTER — Telehealth: Payer: Self-pay | Admitting: Family Medicine

## 2012-04-08 ENCOUNTER — Ambulatory Visit (INDEPENDENT_AMBULATORY_CARE_PROVIDER_SITE_OTHER): Payer: BC Managed Care – PPO | Admitting: *Deleted

## 2012-04-08 DIAGNOSIS — I10 Essential (primary) hypertension: Secondary | ICD-10-CM

## 2012-04-08 NOTE — Telephone Encounter (Signed)
Called patient's cell phone to report results of labs.  Of note, his TSH is high and I would like to start him on low-dose LT4 and recheck the TSH in 6 weeks.  Will also discuss the negative result of HIV test. I left message asking him to call me.  Paula Compton, MD

## 2012-04-08 NOTE — Progress Notes (Signed)
Patient in for BP check to compare his home monitor with office monitor. BP home machine checked LA 148/86 pulse 63. Our office regular adult cuff  LA  140/80 pulse 68.  He will return for BP check in a few weeks. Dr. Mauricio Po has just switched medication he states.

## 2012-04-09 ENCOUNTER — Telehealth: Payer: Self-pay | Admitting: Family Medicine

## 2012-04-09 NOTE — Telephone Encounter (Signed)
Patient came into office on 04/08/12 and we discussed results of his recent labs at length.  Paul [name]

## 2012-04-16 ENCOUNTER — Telehealth: Payer: Self-pay | Admitting: Family Medicine

## 2012-04-16 DIAGNOSIS — E039 Hypothyroidism, unspecified: Secondary | ICD-10-CM

## 2012-04-16 NOTE — Telephone Encounter (Signed)
Did not see that any thyroid medication was prescribed.  Spoke with patient.  Since TSH only slightly elevated (and I do not see a  T4 )  do not think any rush to prescribe replacement.   He is ok with waiting until Dr Mauricio Po returns.  Will route to him

## 2012-04-16 NOTE — Telephone Encounter (Signed)
Was told that he needed start taking thyroid medicine.  The pharmacy has not rec'd script yet.  pls advise   [facility]- [address]

## 2012-04-16 NOTE — Telephone Encounter (Signed)
Do not see that Rx has been sent. Will send to preceptor , Dr. Deirdre Priest.

## 2012-04-26 DIAGNOSIS — E039 Hypothyroidism, unspecified: Secondary | ICD-10-CM | POA: Insufficient documentation

## 2012-04-26 NOTE — Telephone Encounter (Signed)
Called patient, who plans to come for repeat TSH and free T4 later this week.  Will likely start with low-dose FT4 at 43mcg/day if labs confirm hypothyroidism. JB

## 2012-04-29 ENCOUNTER — Other Ambulatory Visit: Payer: BC Managed Care – PPO

## 2012-04-29 DIAGNOSIS — E039 Hypothyroidism, unspecified: Secondary | ICD-10-CM

## 2012-04-29 LAB — T4, FREE: Free T4: 1.02 ng/dL (ref 0.80–1.80)

## 2012-04-29 LAB — TSH: TSH: 4.943 u[IU]/mL — ABNORMAL HIGH (ref 0.350–4.500)

## 2012-04-29 NOTE — Progress Notes (Signed)
TSH AND FREE T4 DONE TODAY Paul [name]

## 2012-04-30 ENCOUNTER — Telehealth: Payer: Self-pay | Admitting: Family Medicine

## 2012-04-30 NOTE — Telephone Encounter (Signed)
Called patient to report his thyroid study.  Discussed the symptoms of hypothryoidism, of which he has none.  Will consider rechecking in the future to monitor, but will not treat as he does not have hypothyroidism based upon his normal free T4 level. JB

## 2012-05-18 ENCOUNTER — Telehealth: Payer: Self-pay | Admitting: Family Medicine

## 2012-05-18 MED ORDER — FELODIPINE ER 10 MG PO TB24: 20.0000 mg | ORAL_TABLET | Freq: Every day | ORAL | Status: DC

## 2012-05-18 NOTE — Telephone Encounter (Signed)
Change in medication dose for felodipine to reflect what patient is taking.  New Rx sent in.  JB

## 2012-05-18 NOTE — Telephone Encounter (Signed)
Patient is calling because he needs an increase on his Felodipine to 20 mg and his pharmacy needs an Rx to reflect that.  He uses [facility] on [address].

## 2012-05-18 NOTE — Telephone Encounter (Signed)
Called pt. Left message to check pharmacy. Paul [name], Paul [name]

## 2012-05-31 ENCOUNTER — Other Ambulatory Visit: Payer: Self-pay | Admitting: Family Medicine

## 2012-06-01 ENCOUNTER — Other Ambulatory Visit: Payer: Self-pay | Admitting: *Deleted

## 2012-06-01 MED ORDER — CLONIDINE HCL 0.3 MG/24HR TD PTWK: 1.0000 | MEDICATED_PATCH | TRANSDERMAL | Status: DC

## 2012-06-01 NOTE — Progress Notes (Signed)
Refilled per Dr. Katrinka Blazing.

## 2012-06-12 ENCOUNTER — Other Ambulatory Visit: Payer: Self-pay | Admitting: Family Medicine

## 2012-06-14 ENCOUNTER — Other Ambulatory Visit: Payer: Self-pay | Admitting: Family Medicine

## 2012-07-06 ENCOUNTER — Ambulatory Visit (INDEPENDENT_AMBULATORY_CARE_PROVIDER_SITE_OTHER): Payer: BC Managed Care – PPO | Admitting: Sports Medicine

## 2012-07-06 ENCOUNTER — Encounter: Payer: Self-pay | Admitting: Sports Medicine

## 2012-07-06 VITALS — BP 140/80 | Ht 69.0 in | Wt 183.0 lb

## 2012-07-06 DIAGNOSIS — M5137 Other intervertebral disc degeneration, lumbosacral region: Secondary | ICD-10-CM

## 2012-07-06 DIAGNOSIS — M7542 Impingement syndrome of left shoulder: Secondary | ICD-10-CM

## 2012-07-06 DIAGNOSIS — M25819 Other specified joint disorders, unspecified shoulder: Secondary | ICD-10-CM

## 2012-07-06 NOTE — Progress Notes (Signed)
Patient ID: Paul [name], male   DOB: 02-28-1935, 78 y.o.   MRN: [id number]  Follow up for back, wrist, and shoulder pain.  Subjective:  Paul [name] is a 78 year old African American male with multiple medical problems including lumbar spinal stenosis, total knee replacement, CKD Stage III, and HTN presenting for follow up.  Seen in clinic and has been managed for multiple ailments including lumbar back pain, L wrist pain, L shoulder bursitis that are currently well controlled.  Squeezing ball under warm water daily for wrist strengthening that has helped more than topical creams.  Has been seeing Dr. Antoine Primas for lumbar manipulation every 4-5 weeks with some relief and has been doing core exercises that has helped as well.  Starting last August increased his daily exercise regimen at the [facility] to include weight training targeting his upper body and core exercises.  Has been doing well overall with exercises, does have some issues with shoulder press causing L shoulder pain.  Cardio consists of 30 min of stationary bike for 4.5 miles, HR up to 90 bpm, concerned not reaching target HR for age.  Would like to make sure his exercise regimen is adequate.  Of note, also mentioned in the last couple of months developed some Rt lateral hip/thigh tingling with development of rash as well.  Since then as almost resolved.  Received herpes zoster vaccination about 1 year ago.               Objective: BP 140/80  Ht 5\' 9"  (1.753 m)  Wt 183 lb (83.008 kg)  BMI 27.01 kg/m2 GEN:  Well appearing, interactive elderly African American male in no acute distress.   MSK:  L shoulder:  No obvious deformity or swelling.  Full arm flexion, extension. Able to do cross body with no pain.  No drop arm.    Wrists:  No obvious deformity or swelling.  Full wrist flexion and extension.  No pain with movement.   SKIN:  RT lateral upper and mid hip/thigh with scattered near healed hyperpigmented small papules. No vesicles.  No  erythema or drainage.      Assessment and Plan: Paul [name] is a 78 year old African American male with lumbar spinal stenosis, TKR, HTN, and CKD who is overall doing well, back, shoulder, and wrist pain controlled with current regimen of strength training exercises and lumbar manipulation. Also recently with attenuated herpes zoster given rash in dermatomal distribution and symptomatology, resolving.    - Encouraged continuing daily exercise regimen with cardio as well as weight training.  Should avoid over fatiguing once he starts playing golf more often.   - Given rotator cuff exercise handouts with light weights.  Avoid overtaxing rotator cuff muscles with large weights.    - Recommended altering exercise regimen in the following ways:  1. Increasing distance on stationary bike (low resistance) gradually up to a goal of 6 miles in 30 minutes, 2. limit leg extensions to 60 degrees of knee flexion, 3. no back extension exercises due to stenosis history, 4. Sit ups 20-25 with 5-10 with rotation to target obliques.       Walden Field, MD [facility] PGY-1 07/06/2012 11:22 AM

## 2012-07-06 NOTE — Assessment & Plan Note (Signed)
This is mild at the present time and has shown some steady improvement over the past 2 weeks  Start a home exercise program to include some rotator cuff exercises on days that he is not doing other exercises for the shoulder

## 2012-07-06 NOTE — Assessment & Plan Note (Signed)
His back symptoms have responded very well to exercise and manipulation  He was given a plan for some additional exercises as noted  He will continue with periodic manipulation

## 2012-07-20 ENCOUNTER — Ambulatory Visit: Payer: BC Managed Care – PPO | Admitting: Family Medicine

## 2012-08-10 ENCOUNTER — Ambulatory Visit (INDEPENDENT_AMBULATORY_CARE_PROVIDER_SITE_OTHER): Payer: BC Managed Care – PPO | Admitting: Family Medicine

## 2012-08-10 VITALS — BP 132/77 | Ht 69.0 in | Wt 183.0 lb

## 2012-08-10 DIAGNOSIS — M999 Biomechanical lesion, unspecified: Secondary | ICD-10-CM

## 2012-08-10 NOTE — Assessment & Plan Note (Signed)
Decision today to treat with OMT was based on Physical Exam  After verbal consent patient was treated with HVLA and Me techniques in lumbar and sacral areas  Patient tolerated the procedure well with improvement in symptoms  Patient given exercises, stretches and lifestyle modifications  See medications in patient instructions if given  Patient will follow up in PRN  It has now been 2-1/2 years since we've been doing manipulation and he has improved considerably.

## 2012-08-10 NOTE — Progress Notes (Signed)
Subjective: Patient is also here for manipulation therapy for his spinal stenosis. Patient states that he is continuing to do the core exercises as well as continuing to stay physically active which has helped considerably.  Patient states that this is been doing very good but does bring in his workout assignments to have them reviewed to see if there's anything he should avoid. Patient did state that back extension did seem to exacerbate his pain.  Patient denies any weakness in the legs or any numbness. Patient also denies any bowel or bladder incontinence. 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 No fever or chills, no bowel or bladder incontinence.     Physical exam: Blood pressure 132/77, height 5\' 9"  (1.753 m), weight 183 lb (83.008 kg). Gen: NAD alert CV: RRR no murmur appreciated  Pul: CTAB  Ext: no edema 1+ pulses bilaterally 2+ DTR no clonus. Sensation intact  Neg SLT Neg trendelenburg   OMT Findings:  Cervical: neutral  Thoracic: T7E rotated and side bent right  T8 extended rotated and side bent right  Lumbar: L4 F RS left,  Sacrum: Right on right

## 2012-09-05 ENCOUNTER — Other Ambulatory Visit: Payer: Self-pay | Admitting: Family Medicine

## 2012-09-10 ENCOUNTER — Ambulatory Visit (INDEPENDENT_AMBULATORY_CARE_PROVIDER_SITE_OTHER): Payer: BC Managed Care – PPO | Admitting: Family Medicine

## 2012-09-10 VITALS — BP 144/78 | Ht 69.0 in | Wt 183.0 lb

## 2012-09-10 DIAGNOSIS — M25532 Pain in left wrist: Secondary | ICD-10-CM

## 2012-09-10 DIAGNOSIS — IMO0002 Reserved for concepts with insufficient information to code with codable children: Secondary | ICD-10-CM

## 2012-09-10 DIAGNOSIS — M1710 Unilateral primary osteoarthritis, unspecified knee: Secondary | ICD-10-CM

## 2012-09-10 DIAGNOSIS — M25539 Pain in unspecified wrist: Secondary | ICD-10-CM

## 2012-09-10 NOTE — Assessment & Plan Note (Signed)
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 Plan to obtain an x-ray of his wrist Continue topical NSAIDs as needed for pain Continue hand therapy at home Followup in one month

## 2012-09-10 NOTE — Patient Instructions (Addendum)
Thank you for coming in today. Continue your activity as tolerated.  Use the Aspercreme as needed.  Get the xrays in 3-4 weeks.  Return after xrays if the wrist is still bothering you.

## 2012-09-10 NOTE — Progress Notes (Signed)
Paul [name] is a 78 y.o. male who presents to [facility] today for left wrist pain present for several months. Patient has pain in the left wrist in the anatomical snuff box worse in the morning and better with activity. He denies any falls or injuries. He notes some swelling. He typically is able to get his wrist feeling better by using a squeeze ball under warm water in the morning.  He has tried some anti-inflammatory topical medications which have helped a bit. He denies any radiating pain weakness or numbness. He is able to play golf, a sport he loves with zero pain. He says his pain is about 98% good.   PMH reviewed.  History  Substance Use Topics  . Smoking status: Former Smoker    Quit date: 04/28/1972  . Smokeless tobacco: Never Used  . Alcohol Use: 3.5 oz/week    7 drink(s) per week     Comment: 1 glass of wine a night   ROS as above otherwise neg   Exam:  BP 144/78  Ht 5\' 9"  (1.753 m)  Wt 183 lb (83.008 kg)  BMI 27.01 kg/m2 Gen: Well NAD MSK: Left wrist.  swelling Mildly tender in the anatomical snuff.  Nontender in the [facility] Old flexion contracture of fourth and fifth PIP.  Normal hand motion and strength otherwise Capillary refill sensation intact distally.  Limited musculoskeletal ultrasound of the left wrist. Scaphoid visualized. Appears to be a step off with increased Doppler activity in the midportion of the scaphoid.  Consistent with spur versus old nonunion Extensor compartments 1-5 normal appearing on ultrasound

## 2012-09-30 ENCOUNTER — Other Ambulatory Visit: Payer: Self-pay | Admitting: Family Medicine

## 2012-10-01 ENCOUNTER — Ambulatory Visit
Admission: RE | Admit: 2012-10-01 | Discharge: 2012-10-01 | Disposition: A | Discharge status: Home / Self Care | Payer: Medicare Other | Source: Ambulatory Visit | Attending: Family Medicine | Admitting: Family Medicine

## 2012-10-01 DIAGNOSIS — M25532 Pain in left wrist: Secondary | ICD-10-CM

## 2012-10-07 ENCOUNTER — Telehealth: Payer: Self-pay | Admitting: Family Medicine

## 2012-10-07 NOTE — Telephone Encounter (Signed)
Called and left a message.  Will call back.

## 2012-11-23 ENCOUNTER — Other Ambulatory Visit: Payer: Self-pay | Admitting: Family Medicine

## 2012-12-04 ENCOUNTER — Other Ambulatory Visit: Payer: Self-pay | Admitting: Family Medicine

## 2012-12-16 ENCOUNTER — Encounter: Payer: Self-pay | Admitting: Family Medicine

## 2012-12-16 ENCOUNTER — Ambulatory Visit (INDEPENDENT_AMBULATORY_CARE_PROVIDER_SITE_OTHER): Payer: Medicare Other | Admitting: Family Medicine

## 2012-12-16 VITALS — BP 154/92 | HR 63 | Temp 98.2°F | Wt 179.0 lb

## 2012-12-16 DIAGNOSIS — IMO0002 Reserved for concepts with insufficient information to code with codable children: Secondary | ICD-10-CM

## 2012-12-16 DIAGNOSIS — M999 Biomechanical lesion, unspecified: Secondary | ICD-10-CM

## 2012-12-16 NOTE — Assessment & Plan Note (Signed)
Decision today to treat with OMT was based on Physical Exam  After verbal consent patient was treated with HVLA and Me techniques in thoracic,  lumbar and sacral areas  Patient tolerated the procedure well with improvement in symptoms  Patient given exercises, stretches and lifestyle modifications  See medications in patient instructions if given  Patient will follow up in 6-8 weeks

## 2012-12-16 NOTE — Progress Notes (Signed)
  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 He does have a past medical history significant for spinal stenosis. Patient continues to do most of his exercises as well as playing golf 5-6 days a week. Patient still states that he does have some exacerbation of his low back pain with radiation down the lateral aspects of the leg bilaterally from time to time. Patient states though that it still seems to dissipate when he changes position. Patient states that he can wake him up at night but once again to change position he is able to go back to sleep. Patient is not taking any medication on a regular basis for this. Denies any new symptoms  Past medical history, Surgical history, Family history not pertinant except as noted below, Social history, Allergies, and medications have been entered into the medical record, reviewed, and no changes needed.   Review of Systems: No fevers, chills, night sweats, weight loss, chest pain, or shortness of breath.   Objective:   Blood pressure 154/92, pulse 63, temperature 98.2 F (36.8 C), temperature source Oral, weight 179 lb (81.194 kg).  General: Well Developed, well nourished, and in no acute distress.  Neuro: Alert and oriented x3, extra-ocular muscles intact, sensation grossly intact.  HEENT: Normocephalic, atraumatic, pupils equal round reactive to light, neck supple, no masses, no lymphadenopathy, thyroid nonpalpable.  Skin: Warm and dry, no rashes. Does have incisions on knees bilaterally for knee replacement surgery. Cardiac:  no lower extremity edema. Respiratory: Not using accessory muscles, speaking in full sentences. Abdominal: NT, soft Gait: Nonantlagic, good balance and coordination Lymphatic: no lymphadenopathy in neck or axillae on palpation, non tender.  Musculoskeletal: Inspection and palpation of the right and left upper extremities including  the shoulders elbows and wrist are unremarkable with full range of motion and good muscle strength and tone. Inspection and palpation of the right and left lower extremities including the hips knees and ankles are unremarkable and nontender with full range of motion and good muscle strength and tone and are symmetric. OMT Findings:  Cervical: neutral  Thoracic: T7E rotated and side bent right  T8 extended rotated and side bent right  Lumbar: L4 F RS left,  Sacrum: Right on right   Impression and Recommendations:

## 2012-12-16 NOTE — Assessment & Plan Note (Signed)
Decision today to treat with OMT was based on Physical Exam  After verbal consent patient was treated with HVLA and Me techniques in thoracic lumbar and sacral areas  Patient tolerated the procedure well with improvement in symptoms  Patient given exercises, stretches and lifestyle modifications  See medications in patient instructions if given  Patient will follow up in 6-8 weeks

## 2012-12-16 NOTE — Assessment & Plan Note (Signed)
Patient continues to have intermittent back pain but is able to compensate with daily activities as well as core strengthening. This is not taking any of his activities of daily living. We'll continue osteopathic manipulation.

## 2012-12-16 NOTE — Assessment & Plan Note (Signed)
Decision today to treat with OMT was based on Physical Exam  After verbal consent patient was treated with HVLA and Me techniques in lumbar and sacral areas  Patient tolerated the procedure well with improvement in symptoms  Patient given exercises, stretches and lifestyle modifications  See medications in patient instructions if given  Patient will follow up in 6-8 weeks  It has now been 3 years since we've been doing manipulation and he has improved considerably.

## 2013-02-02 ENCOUNTER — Other Ambulatory Visit: Payer: Self-pay | Admitting: Family Medicine

## 2013-02-07 ENCOUNTER — Encounter: Payer: Self-pay | Admitting: Family Medicine

## 2013-02-07 ENCOUNTER — Ambulatory Visit (INDEPENDENT_AMBULATORY_CARE_PROVIDER_SITE_OTHER): Payer: Medicare Other | Admitting: Family Medicine

## 2013-02-07 VITALS — BP 148/82 | HR 57 | Wt 182.0 lb

## 2013-02-07 DIAGNOSIS — M999 Biomechanical lesion, unspecified: Secondary | ICD-10-CM

## 2013-02-07 DIAGNOSIS — IMO0002 Reserved for concepts with insufficient information to code with codable children: Secondary | ICD-10-CM

## 2013-02-07 NOTE — Assessment & Plan Note (Addendum)
Patient continues to do remarkably well. Patient seems to even be more active than when I first met him greater than 4 years ago. Encourage him to continue doing everything that he is doing. We'll continue osteopathic manipulation on a 6 week interval.

## 2013-02-07 NOTE — Assessment & Plan Note (Signed)
Decision today to treat with OMT was based on Physical Exam  After verbal consent patient was treated with HVLA and ME techniques in thoracic, lumbar and sacral areas  Patient tolerated the procedure well with improvement in symptoms  Patient given exercises, stretches and lifestyle modifications  See medications in patient instructions if given  Patient will follow up in 6 weeks                         

## 2013-02-07 NOTE — Progress Notes (Signed)
  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 He does have a past medical history significant for spinal stenosis. Patient states he continues to do very well. Patient continues to do most of his exercises as well as playing golf 5-6 days a week. Continues to do his stretching as well as core exercises. Patient did just get back from a two-week car trip to [place]. Patient feels a little more stiff. Patient is now retired completely from teaching. Patient denies any recent exacerbations of back pain. Patient states though that it still seems to dissipate when he changes position. Patient states that he can wake him up at night but once again to change position he is able to go back to sleep. Patient is not taking any medication on a regular basis for this. Denies any new symptoms  Past medical history, Surgical history, Family history not pertinant except as noted below, Social history, Allergies, and medications have been entered into the medical record, reviewed, and no changes needed.   Review of Systems: No fevers, chills, night sweats, weight loss, chest pain, or shortness of breath.   Objective:   Blood pressure 148/82, pulse 57, weight 182 lb (82.555 kg), SpO2 96.00%.  General: Well Developed, well nourished, and in no acute distress.  Neuro: Alert and oriented x3, extra-ocular muscles intact, sensation grossly intact.  HEENT: Normocephalic, atraumatic, pupils equal round reactive to light, neck supple, no masses, no lymphadenopathy, thyroid nonpalpable.  Skin: Warm and dry, no rashes. Does have incisions on knees bilaterally for knee replacement surgery. Cardiac:  no lower extremity edema. Respiratory: Not using accessory muscles, speaking in full sentences. Abdominal: NT, soft Gait: Nonantlagic, good balance and coordination Lymphatic: no lymphadenopathy in neck or axillae on palpation, non  tender.  Musculoskeletal: Inspection and palpation of the right and left upper extremities including the shoulders elbows and wrist are unremarkable with full range of motion and good muscle strength and tone. Inspection and palpation of the right and left lower extremities including the hips knees and ankles are unremarkable and nontender with full range of motion and good muscle strength and tone and are symmetric. Back Exam:  Inspection: Unremarkable  Motion: Flexion 45 deg, Extension 45 deg, Side Bending to 45 deg bilaterally,  Rotation to 45 deg bilaterally  SLR laying: Negative  XSLR laying: Negative  Palpable tenderness: None. FABER: negative. Sensory change: Gross sensation intact to all lumbar and sacral dermatomes.  Reflexes: 2+ at both patellar tendons, 2+ at achilles tendons, Babinski's downgoing.  Strength at foot  Plantar-flexion: 5/5 Dorsi-flexion: 5/5 Eversion: 5/5 Inversion: 5/5  Leg strength  Quad: 5/5 Hamstring: 5/5 Hip flexor: 5/5 Hip abductors: 5/5  Gait unremarkable.  OMT Findings:  Cervical: neutral  Thoracic: T5E rotated and side bent right  T8 extended rotated and side bent right  Lumbar: L4 F RS left,  Sacrum: Right on right   Impression and Recommendations:

## 2013-03-14 ENCOUNTER — Ambulatory Visit (INDEPENDENT_AMBULATORY_CARE_PROVIDER_SITE_OTHER): Payer: Medicare Other | Admitting: *Deleted

## 2013-03-14 DIAGNOSIS — Z23 Encounter for immunization: Secondary | ICD-10-CM

## 2013-03-16 ENCOUNTER — Other Ambulatory Visit: Payer: Self-pay | Admitting: *Deleted

## 2013-03-16 DIAGNOSIS — I10 Essential (primary) hypertension: Secondary | ICD-10-CM

## 2013-03-21 ENCOUNTER — Ambulatory Visit: Payer: Medicare Other | Admitting: Family Medicine

## 2013-03-23 ENCOUNTER — Ambulatory Visit (INDEPENDENT_AMBULATORY_CARE_PROVIDER_SITE_OTHER): Payer: Medicare Other | Admitting: Family Medicine

## 2013-03-23 ENCOUNTER — Encounter: Payer: Self-pay | Admitting: Family Medicine

## 2013-03-23 VITALS — BP 140/82 | HR 59 | Wt 181.0 lb

## 2013-03-23 DIAGNOSIS — M999 Biomechanical lesion, unspecified: Secondary | ICD-10-CM

## 2013-03-23 DIAGNOSIS — M19049 Primary osteoarthritis, unspecified hand: Secondary | ICD-10-CM

## 2013-03-23 DIAGNOSIS — M5137 Other intervertebral disc degeneration, lumbosacral region: Secondary | ICD-10-CM

## 2013-03-23 NOTE — Assessment & Plan Note (Signed)
Decision today to treat with OMT was based on Physical Exam  After verbal consent patient was treated with HVLA and ME techniques in thoracic, lumbar and sacral areas  Patient tolerated the procedure well with improvement in symptoms  Patient given exercises, stretches and lifestyle modifications  See medications in patient instructions if given  Patient will follow up in 3-4 weeks                       

## 2013-03-23 NOTE — Progress Notes (Signed)
  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 He does have a past medical history significant for spinal stenosis. Patient states he continues to do very well. Patient continues to do most of his exercises as well as playing golf 5-6 days a week. Patient continues to go to the gym 5 days a week. Patient continue to work on core strength which is helped out significantly. Patient is having no pain. Patient though does state that he is having some mild thumb pain. Patient has been diagnosed with CMC arthritis previously. Patient is wearing an over-the-counter brace which seems to be helpful. Patient states as long as he does some exercises in warm bacitracin to do very well. Patient denies any numbness or weakness. Patient would like it evaluated possible. Patient gets the discomfort of this 3 out of 10.  Past medical history, Surgical history, Family history not pertinant except as noted below, Social history, Allergies, and medications have been entered into the medical record, reviewed, and no changes needed.   Review of Systems: No fevers, chills, night sweats, weight loss, chest pain, or shortness of breath.   Objective:   Blood pressure 140/82, pulse 59, weight 181 lb (82.101 kg), SpO2 97.00%.  General: Well Developed, well nourished, and in no acute distress.  Neuro: Alert and oriented x3, extra-ocular muscles intact, sensation grossly intact.  HEENT: Normocephalic, atraumatic, pupils equal round reactive to light, neck supple, no masses, no lymphadenopathy, thyroid nonpalpable.  Skin: Warm and dry, no rashes. Does have incisions on knees bilaterally for knee replacement surgery. Cardiac:  no lower extremity edema. Respiratory: Not using accessory muscles, speaking in full sentences. Abdominal: NT, soft Gait: Nonantlagic, good balance and coordination Lymphatic: no lymphadenopathy in neck or  axillae on palpation, non tender.  Musculoskeletal: Inspection and palpation of the right and left upper extremities including the shoulders elbows and wrist are unremarkable with full range of motion and good muscle strength and tone. Inspection and palpation of the right and left lower extremities including the hips knees and ankles are unremarkable and nontender with full range of motion and good muscle strength and tone and are symmetric. 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 He is neurovascularly intact with full range of motion. Good strength. Back Exam:  Inspection: Unremarkable  Motion: Flexion 45 deg, Extension 45 deg, Side Bending to 45 deg bilaterally,  Rotation to 45 deg bilaterally  SLR laying: Negative  XSLR laying: Negative  Palpable tenderness: None. FABER: negative. Sensory change: Gross sensation intact to all lumbar and sacral dermatomes.  Reflexes: 2+ at both patellar tendons, 2+ at achilles tendons, Babinski's downgoing.  Strength at foot  Plantar-flexion: 5/5 Dorsi-flexion: 5/5 Eversion: 5/5 Inversion: 5/5  Leg strength  Quad: 5/5 Hamstring: 5/5 Hip flexor: 5/5 Hip abductors: 5/5  Gait unremarkable.  OMT Findings:  Cervical: neutral  Thoracic: T5E rotated and side bent right  T8 extended rotated and side bent right  Lumbar:  L2 flexed rotated and side bent right  L4 F RS left,  Sacrum: Right on right   Impression and Recommendations:

## 2013-03-23 NOTE — Assessment & Plan Note (Signed)
Patient continued to do remarkably well with the amount of spinal stenosis he has. Patient is not showing any muscle atrophy and continues to do significant amount of activity. Patient will continue with the exercises 3-4 times a week. Discussed continuing to monitor weight which is beneficial. Patient will followup for manipulation therapy again in 3-4 weeks. Patient likely just need to be seen every 2 months.

## 2013-03-23 NOTE — Progress Notes (Signed)
Pre-visit discussion using our clinic review tool. No additional management support is needed unless otherwise documented below in the visit note.  

## 2013-03-23 NOTE — Assessment & Plan Note (Addendum)
Discussed with patient about different treatment options. Patient will continue to wear the over-the-counter brace, discussed over-the-counter medications that can be beneficial as well as topicals. Patient was given some exercises which ultimately will be helpful. Patient continues to have pain we can was consider an intra-articular injection as well as a Exos brace. Follow up in 3-4 weeks.

## 2013-03-23 NOTE — Patient Instructions (Signed)
Do changes for exercises Keep up the good work Happy thanksgiving  See you soon!

## 2013-04-08 ENCOUNTER — Ambulatory Visit (INDEPENDENT_AMBULATORY_CARE_PROVIDER_SITE_OTHER): Payer: Medicare Other | Admitting: Family Medicine

## 2013-04-08 ENCOUNTER — Encounter: Payer: Self-pay | Admitting: Family Medicine

## 2013-04-08 ENCOUNTER — Other Ambulatory Visit: Payer: Self-pay

## 2013-04-08 ENCOUNTER — Ambulatory Visit (HOSPITAL_COMMUNITY)
Admission: RE | Admit: 2013-04-08 | Discharge: 2013-04-08 | Disposition: A | Discharge status: Home / Self Care | Payer: Medicare Other | Source: Ambulatory Visit | Attending: Sports Medicine | Admitting: Sports Medicine

## 2013-04-08 ENCOUNTER — Encounter: Payer: Medicare Other | Admitting: Sports Medicine

## 2013-04-08 VITALS — BP 146/82 | HR 51 | Ht 69.0 in | Wt 183.0 lb

## 2013-04-08 DIAGNOSIS — Z23 Encounter for immunization: Secondary | ICD-10-CM

## 2013-04-08 DIAGNOSIS — I10 Essential (primary) hypertension: Secondary | ICD-10-CM | POA: Insufficient documentation

## 2013-04-08 DIAGNOSIS — N183 Chronic kidney disease, stage 3 unspecified: Secondary | ICD-10-CM

## 2013-04-08 DIAGNOSIS — R634 Abnormal weight loss: Secondary | ICD-10-CM

## 2013-04-08 DIAGNOSIS — J45909 Unspecified asthma, uncomplicated: Secondary | ICD-10-CM

## 2013-04-08 DIAGNOSIS — E039 Hypothyroidism, unspecified: Secondary | ICD-10-CM

## 2013-04-08 LAB — LIPID PANEL
Cholesterol: 202 mg/dL — ABNORMAL HIGH (ref 0–200)
HDL: 65 mg/dL (ref >39)
LDL Cholesterol: 125 mg/dL — ABNORMAL HIGH (ref 0–99)
Total CHOL/HDL Ratio: 3.1 Ratio
Triglycerides: 59 mg/dL (ref <150)
VLDL: 12 mg/dL (ref 0–40)

## 2013-04-08 LAB — BASIC METABOLIC PANEL
BUN: 22 mg/dL (ref 6–23)
CO2: 27 mEq/L (ref 19–32)
Calcium: 9.5 mg/dL (ref 8.4–10.5)
Chloride: 103 mEq/L (ref 96–112)
Creat: 1.52 mg/dL — ABNORMAL HIGH (ref 0.50–1.35)
Glucose, Bld: 90 mg/dL (ref 70–99)
Potassium: 4.1 mEq/L (ref 3.5–5.3)
Sodium: 142 mEq/L (ref 135–145)

## 2013-04-08 LAB — CBC
HCT: 40.7 % (ref 39.0–52.0)
Hemoglobin: 13.9 g/dL (ref 13.0–17.0)
MCH: 27.9 pg (ref 26.0–34.0)
MCHC: 34.2 g/dL (ref 30.0–36.0)
MCV: 81.6 fL (ref 78.0–100.0)
Platelets: 155 10*3/uL (ref 150–400)
RBC: 4.99 MIL/uL (ref 4.22–5.81)
RDW: 15.2 % (ref 11.5–15.5)
WBC: 5.6 10*3/uL (ref 4.0–10.5)

## 2013-04-08 LAB — T4, FREE: Free T4: 0.99 ng/dL (ref 0.80–1.80)

## 2013-04-08 LAB — TSH: TSH: 4.32 u[IU]/mL (ref 0.350–4.500)

## 2013-04-08 NOTE — Patient Instructions (Signed)
It was a pleasure to see you today; I am glad to hear you have been doing well with respect the the deQuervains and the spinal stenosis.   As we discussed, we are checking your kidney function, lipids and thyroid today.  I will be in touch with the results and send you a copy for your records.   You are getting the Prevnar flu vaccine today.   You will be due for your 10-year screening colonoscopy in 2015, which does not require a referral from our office.  You may call your GI to schedule this at your convenience.  Please ask them to send a copy of the report to me for our records.

## 2013-04-08 NOTE — Assessment & Plan Note (Signed)
No flares in years.  Monitor.

## 2013-04-08 NOTE — Assessment & Plan Note (Signed)
To follow with metabolic panel today.

## 2013-04-08 NOTE — Assessment & Plan Note (Signed)
Elevated TSH with normal free T4 in the past.  No symptoms of overt hypothyroidism.  Plan to recheck TSH today.

## 2013-04-08 NOTE — Assessment & Plan Note (Signed)
Intentional and attributable to patient's diet changes, exercise.  He reports he feels very well. No further workup at this point.

## 2013-04-08 NOTE — Progress Notes (Signed)
   Subjective:    Patient ID: Paul [name], male    DOB: [birth year], 78 y.o.   MRN: [id number]  HPI Patient seen today for general wellness visit; he reports feeling well.  Doing a lot of core strengthening with weights, cardio, stationary bike for 30 min/day, and golf.  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 Sees Dr. Terrilee Files for OMT for the spinal stenosis, is doing well with this.  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     ROS: Denies chest pain, dyspnea, cough, nausea/vomiting, denies changes in bowel habits or blood per rectum.  Has had intermittent mild constipation that resolves with PEG use.  Has had intentional weight loss associated with dietary changes (eats large breakfast, modest lunch and snack for dinner).  Nocturia 2-3x/night, follows with Dr Isabel Caprice for this.  Had TURP 2 yrs ago,which helped with BPH symptoms.   Reviewed Health Maintenance with patient, including indications for repeat screening colonoscopy and instructions on how to proceed with this.   Going for annual exercise stress test with Dr Darrick Penna today.  Review of Systems     Objective:   Physical Exam Well appearing, no apparent distress. HEENT Neck supple, no thyroid nodules or tenderness.  COR Regular S1S2, no extra sounds PULM Clear bilat ABD Soft, nontender, nondistended.  EXTS No edema noted.        Assessment & Plan:

## 2013-04-11 ENCOUNTER — Encounter: Payer: Self-pay | Admitting: Family Medicine

## 2013-04-13 ENCOUNTER — Encounter: Payer: Self-pay | Admitting: Family Medicine

## 2013-04-13 ENCOUNTER — Ambulatory Visit (INDEPENDENT_AMBULATORY_CARE_PROVIDER_SITE_OTHER): Payer: Medicare Other | Admitting: Family Medicine

## 2013-04-13 VITALS — BP 136/72 | HR 50

## 2013-04-13 DIAGNOSIS — M999 Biomechanical lesion, unspecified: Secondary | ICD-10-CM

## 2013-04-13 DIAGNOSIS — M19049 Primary osteoarthritis, unspecified hand: Secondary | ICD-10-CM

## 2013-04-13 NOTE — Assessment & Plan Note (Signed)
Decision today to treat with OMT was based on Physical Exam  After verbal consent patient was treated with HVLA and ME techniques in thoracic, lumbar and sacral areas  Patient tolerated the procedure well with improvement in symptoms  Patient given exercises, stretches and lifestyle modifications  See medications in patient instructions if given  Patient will follow up in 3-4 weeks                       

## 2013-04-13 NOTE — Progress Notes (Signed)
Pre-visit discussion using our clinic review tool. No additional management support is needed unless otherwise documented below in the visit note.  

## 2013-04-13 NOTE — Progress Notes (Signed)
  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 He does have a past medical history significant for spinal stenosis. Patient states he continues to do very well. Patient continues to do most of his exercises as well as playing golf 5-6 days a week. Patient continues to go to the gym 5 days a week. With the good weather patient has also got out more often recently. Denies any new pain Patient though also stating that his left thumb seems to be hurting him more. Patient does have CMC joint arthritis. Patient has an injection in this before multiple months ago and had good results. Patient was wondering about bracing while he played golf. Denies any numbness or tingling. Patient states though it hurts at the end of a long day.  Past medical history, Surgical history, Family history not pertinant except as noted below, Social history, Allergies, and medications have been entered into the medical record, reviewed, and no changes needed.   Review of Systems: No fevers, chills, night sweats, weight loss, chest pain, or shortness of breath.   Objective:   Blood pressure 136/72, pulse 50, SpO2 97.00%.  General: Well Developed, well nourished, and in no acute distress.  Neuro: Alert and oriented x3, extra-ocular muscles intact, sensation grossly intact.  HEENT: Normocephalic, atraumatic, pupils equal round reactive to light, neck supple, no masses, no lymphadenopathy, thyroid nonpalpable.  Skin: Warm and dry, no rashes. Does have incisions on knees bilaterally for knee replacement surgery. Cardiac:  no lower extremity edema. Respiratory: Not using accessory muscles, speaking in full sentences. Abdominal: NT, soft Gait: Nonantlagic, good balance and coordination Lymphatic: no lymphadenopathy in neck or axillae on palpation, non tender.  Musculoskeletal: Inspection and palpation of the right and left upper  extremities including the shoulders elbows and wrist are unremarkable with full range of motion and good muscle strength and tone. Inspection and palpation of the right and left lower extremities including the hips knees and ankles are unremarkable and nontender with full range of motion and good muscle strength and tone and are symmetric. 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 He is neurovascularly intact with full range of motion. Good strength. Back Exam:  Inspection: Unremarkable  Motion: Flexion 45 deg, Extension 45 deg, Side Bending to 45 deg bilaterally,  Rotation to 45 deg bilaterally  SLR laying: Negative  XSLR laying: Negative  Palpable tenderness: None. FABER: negative. Sensory change: Gross sensation intact to all lumbar and sacral dermatomes.  Reflexes: 2+ at both patellar tendons, 2+ at achilles tendons, Babinski's downgoing.  Strength at foot  Plantar-flexion: 5/5 Dorsi-flexion: 5/5 Eversion: 5/5 Inversion: 5/5  Leg strength  Quad: 5/5 Hamstring: 5/5 Hip flexor: 5/5 Hip abductors: 5/5  Gait unremarkable.  OMT Findings:  Cervical: neutral  Thoracic: T3 extended rotated inside that right T 7 extended rotated and side bent right  Lumbar:   L4 F RS left,  Sacrum: Right on right   Impression and Recommendations:

## 2013-04-13 NOTE — Patient Instructions (Signed)
Good to see you Try the brace Get back to me about the stink bugs.  See me again in 4 weeks.

## 2013-04-13 NOTE — Assessment & Plan Note (Signed)
Patient did have a cast that was molded and placed by me today. Patient will try this with activity. Patient brought in his golf club to see if this would be helpful. Patient states it may take some getting used to. Patient also stated that soaking his thumb inwater with a stink bug was beneficial. Told patient it is longer that helps continue. If patient needs any augmentation of the brace and come back at a later date. Patient continues to have pain we'll consider doing another injection.

## 2013-04-15 ENCOUNTER — Other Ambulatory Visit: Payer: Self-pay | Admitting: Family Medicine

## 2013-04-26 ENCOUNTER — Other Ambulatory Visit: Payer: Self-pay | Admitting: Family Medicine

## 2013-05-16 ENCOUNTER — Encounter: Payer: Self-pay | Admitting: Family Medicine

## 2013-05-16 ENCOUNTER — Ambulatory Visit (INDEPENDENT_AMBULATORY_CARE_PROVIDER_SITE_OTHER): Payer: Medicare (Managed Care) | Admitting: Family Medicine

## 2013-05-16 VITALS — BP 134/72 | HR 53 | Temp 97.8°F | Resp 16 | Wt 178.0 lb

## 2013-05-16 DIAGNOSIS — M19049 Primary osteoarthritis, unspecified hand: Secondary | ICD-10-CM

## 2013-05-16 DIAGNOSIS — M999 Biomechanical lesion, unspecified: Secondary | ICD-10-CM

## 2013-05-16 DIAGNOSIS — IMO0002 Reserved for concepts with insufficient information to code with codable children: Secondary | ICD-10-CM

## 2013-05-16 NOTE — Assessment & Plan Note (Signed)
Patient continues to respond very well. Patient continues to be very active think is the most beneficial thing for him. Patient will followup again in 6 weeks for further osteopathic manipulation.

## 2013-05-16 NOTE — Assessment & Plan Note (Signed)
Patient continues to improve at this time. We'll not do any other changes to his regimen at this time.

## 2013-05-16 NOTE — Patient Instructions (Signed)
Always good to see you [facility] [employer] [facility] [place]-  Get the sport insoles.  Would consider iron 325mg  daily.  Keep me updates on the bugs.  Come see me again in 6 weeks.

## 2013-05-16 NOTE — Assessment & Plan Note (Signed)
Decision today to treat with OMT was based on Physical Exam  After verbal consent patient was treated with HVLA and ME techniques in thoracic, lumbar and sacral areas  Patient tolerated the procedure well with improvement in symptoms  Patient given exercises, stretches and lifestyle modifications  See medications in patient instructions if given  Patient will follow up in 6 weeks                         

## 2013-05-16 NOTE — Progress Notes (Signed)
  Subjective:    CC: Back pain  HPI: Patient is a very pleasant 79 year old recently retired male coming back for osteopathic manipulation. He does have a past medical history significant for spinal stenosis. Patient states he continues to do very well. Patient continues to do most of his exercises as well as playing golf 5-6 days a week. Patient continues to go to the gym 5 days a week. With the good weather patient has also got out more often recently. Denies any new pain Patient is also following up for his left thumb. Patient was made a brace at last visit and states that he wears it most of the time other than when he plays golf. Patient continues to do soaks in stink bug juice and states he continues to improve states that the swelling is down and is happy..  Past medical history, Surgical history, Family history not pertinant except as noted below, Social history, Allergies, and medications have been entered into the medical record, reviewed, and no changes needed.   Review of Systems: No fevers, chills, night sweats, weight loss, chest pain, or shortness of breath.   Objective:   Blood pressure 134/72, pulse 53, temperature 97.8 F (36.6 C), temperature source Oral, resp. rate 16, weight 178 lb 0.6 oz (80.758 kg), SpO2 97.00%.  General: Well Developed, well nourished, and in no acute distress.  Neuro: Alert and oriented x3, extra-ocular muscles intact, sensation grossly intact.  HEENT: Normocephalic, atraumatic, pupils equal round reactive to light, neck supple, no masses, no lymphadenopathy, thyroid nonpalpable.  Skin: Warm and dry, no rashes. Does have incisions on knees bilaterally for knee replacement surgery. Cardiac:  no lower extremity edema. Respiratory: Not using accessory muscles, speaking in full sentences. Abdominal: NT, soft Gait: Nonantlagic, good balance and coordination Lymphatic: no lymphadenopathy in neck or axillae on palpation, non tender.  Musculoskeletal:  Inspection and palpation of the right and left upper extremities including the shoulders elbows and wrist are unremarkable with full range of motion and good muscle strength and tone. Inspection and palpation of the right and left lower extremities including the hips knees and ankles are unremarkable and nontender with full range of motion and good muscle strength and tone and are symmetric. 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 He is neurovascularly intact with full range of motion. Good strength. No new findings Back Exam:  Inspection: Unremarkable  Motion: Flexion 45 deg, Extension 45 deg, Side Bending to 45 deg bilaterally,  Rotation to 45 deg bilaterally  SLR laying: Negative  XSLR laying: Negative  Palpable tenderness: None. FABER: negative. Sensory change: Gross sensation intact to all lumbar and sacral dermatomes.  Reflexes: 2+ at both patellar tendons, 2+ at achilles tendons, Babinski's downgoing.  Strength at foot  Plantar-flexion: 5/5 Dorsi-flexion: 5/5 Eversion: 5/5 Inversion: 5/5  Leg strength  Quad: 5/5 Hamstring: 5/5 Hip flexor: 5/5 Hip abductors: 5/5  Gait unremarkable.  OMT Findings:  Cervical: neutral  Thoracic: T3 extended rotated inside that right T 5 extended rotated and side bent right  Lumbar:  L2 F RS right,   Sacrum:  Right on right   Impression and Recommendations:

## 2013-05-16 NOTE — Progress Notes (Signed)
Pre-visit discussion using our clinic review tool. No additional management support is needed unless otherwise documented below in the visit note.  

## 2013-06-03 ENCOUNTER — Other Ambulatory Visit: Payer: Self-pay | Admitting: Family Medicine

## 2013-06-06 ENCOUNTER — Telehealth: Payer: Self-pay | Admitting: Family Medicine

## 2013-06-06 MED ORDER — COLCHICINE 0.6 MG PO TABS: 0.6000 mg | ORAL_TABLET | Freq: Two times a day (BID) | ORAL | Status: DC | PRN

## 2013-06-06 NOTE — Telephone Encounter (Signed)
Patient is calling in regards to pain he is having in his toes. He has made an appointment for Wednesday but wants to speak with Dr. Jonny RuizJohn beforehand to see if an appointment is even necessary or if a medication can be called in. Please advise.

## 2013-06-06 NOTE — Telephone Encounter (Signed)
Called patient back.  Got swelling of foot on dorsal aspect. Has hx of gout.  Will try colchicine but if he does not make improvement he will come and see me on Wednesday.

## 2013-06-07 NOTE — Telephone Encounter (Signed)
error 

## 2013-06-08 ENCOUNTER — Ambulatory Visit (INDEPENDENT_AMBULATORY_CARE_PROVIDER_SITE_OTHER): Payer: Medicare (Managed Care) | Admitting: Family Medicine

## 2013-06-08 ENCOUNTER — Encounter: Payer: Self-pay | Admitting: Family Medicine

## 2013-06-08 VITALS — BP 128/76 | HR 58 | Temp 97.8°F | Resp 16 | Wt 178.0 lb

## 2013-06-08 DIAGNOSIS — S92919A Unspecified fracture of unspecified toe(s), initial encounter for closed fracture: Secondary | ICD-10-CM

## 2013-06-08 NOTE — Progress Notes (Signed)
Pre-visit discussion using our clinic review tool. No additional management support is needed unless otherwise documented below in the visit note.  

## 2013-06-08 NOTE — Patient Instructions (Signed)
Decrease biking to 3 times a week or don't clip in.  Aim where ankle is to push through with your stride on the bike Tape toe to big toe for next 2 weeks.  Ice 20 minutes 3 times a day.  Vitamin D 2000 IU daily.  Wear rigid sole shoes when you can and avoid barefoot at home.  Come back again in 2 weeks and we will make sure it is healing.

## 2013-06-08 NOTE — Assessment & Plan Note (Signed)
Discussed with patient at great length. Told patient to buddy tape too large toe for now. A rigid shoes for now. Icing protocol as well as vitamin D supplementation. Patient will followup again in 10-12 days for another ultrasound to make sure he is healing properly. Patient is leaving for a trip in 13 days.

## 2013-06-08 NOTE — Progress Notes (Signed)
  Tawana ScaleZach Genoveva [name] D.O. [facility] [address] [place], [place] [address] Phone: [phone] Subjective:      CC: Right toe pain  BJY:[id number]:Subjective Paul [name] is a 79 y.o. male coming in with complaint of right second toe pain. Patient does state that he did hit approximately 5 days ago. Patient states that since then he had significant swelling on the dorsal aspect of his foot has had trouble with ambulation. 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 Patient states that over the course last 2-3 days it seems to be improving. Patient denies any numbness. Patient did have some mild discoloration at the tip of the toe but that seems to have resolved. Patient is concerned because he has a very important [place] trip to [place] coming up. Patient rates the severity of 5/10.     Past medical history, social, surgical and family history all reviewed in electronic medical record.   Review of Systems: No headache, visual changes, nausea, vomiting, diarrhea, constipation, dizziness, abdominal pain, skin rash, fevers, chills, night sweats, weight loss, swollen lymph nodes, body aches, joint swelling, muscle aches, chest pain, shortness of breath, mood changes.   Objective Blood pressure 128/76, pulse 58, temperature 97.8 F (36.6 C), temperature source Oral, resp. rate 16, weight 178 lb (80.74 kg), SpO2 99.00%.  General: No apparent distress alert and oriented x3 mood and affect normal, dressed appropriately.  HEENT: Pupils equal, extraocular movements intact  Respiratory: Patient's speak in full sentences and does not appear short of breath  Cardiovascular: No lower extremity edema, non tender, no erythema  Skin: Warm dry intact with no signs of infection or rash on extremities or on axial skeleton.  Abdomen: Soft nontender  Neuro: Cranial nerves II through XII are intact, neurovascularly intact in all extremities with 2+ DTRs and 2+ pulses.  Lymph: No lymphadenopathy  of posterior or anterior cervical chain or axillae bilaterally.  Gait normal with good balance and coordination.  MSK:  Non tender with full range of motion and good stability and symmetric strength and tone of shoulders, elbows, wrist, hip, knee and ankles bilaterally.  Foot exam shows the patient does have swelling still of the fourth phalanx. Patient is tender to palpation over the DIP joint. Patient has mild discomfort over the dorsal of the foot between the second and fourth toes. Patient is able to ambulate. Neurovascularly intact distally.  Limited muscular skeletal ultrasound was performed and interpreted by me today. Patient's second toe at the DIP joint does show that he has a fracture that is moderately displaced.  Impression: Second phalanx displaced PIP fracture. Appears to be extra-articular.   Impression and Recommendations:     This case required medical decision making of moderate complexity.

## 2013-06-10 ENCOUNTER — Ambulatory Visit: Payer: Medicare (Managed Care) | Admitting: Family Medicine

## 2013-06-27 ENCOUNTER — Ambulatory Visit: Payer: Medicare Other | Admitting: Family Medicine

## 2013-07-07 ENCOUNTER — Other Ambulatory Visit: Payer: Self-pay | Admitting: *Deleted

## 2013-07-07 MED ORDER — COLCHICINE 0.6 MG PO TABS: 0.6000 mg | ORAL_TABLET | Freq: Two times a day (BID) | ORAL | Status: DC | PRN

## 2013-07-08 ENCOUNTER — Ambulatory Visit (INDEPENDENT_AMBULATORY_CARE_PROVIDER_SITE_OTHER): Payer: Medicare (Managed Care) | Admitting: Family Medicine

## 2013-07-08 ENCOUNTER — Encounter: Payer: Self-pay | Admitting: Family Medicine

## 2013-07-08 VITALS — BP 122/68 | HR 67 | Temp 98.2°F | Resp 16 | Wt 181.0 lb

## 2013-07-08 DIAGNOSIS — M999 Biomechanical lesion, unspecified: Secondary | ICD-10-CM

## 2013-07-08 DIAGNOSIS — IMO0002 Reserved for concepts with insufficient information to code with codable children: Secondary | ICD-10-CM

## 2013-07-08 DIAGNOSIS — S92919A Unspecified fracture of unspecified toe(s), initial encounter for closed fracture: Secondary | ICD-10-CM

## 2013-07-08 NOTE — Progress Notes (Signed)
Pre visit review using our clinic review tool, if applicable. No additional management support is needed unless otherwise documented below in the visit note. 

## 2013-07-08 NOTE — Assessment & Plan Note (Signed)
Decision today to treat with OMT was based on Physical Exam  After verbal consent patient was treated with HVLA and ME techniques in thoracic, lumbar and sacral areas  Patient tolerated the procedure well with improvement in symptoms  Patient given exercises, stretches and lifestyle modifications  See medications in patient instructions if given  Patient will follow up in 6 weeks                         

## 2013-07-08 NOTE — Progress Notes (Signed)
  Subjective:    CC: Back pain  HPI: Patient is a very pleasant 79 year old recently retired male coming back for osteopathic manipulation. He does have a past medical history significant for spinal stenosis. Patient states he continues to do very well. Patient continues to do most of his exercises as well as playing golf 5-6 days a week. Patient continues to go to the gym 5 days a week. Patient recently went to [place] and was playing golf every day without any significant discomfort. Patient has no new complaints.  Patient's toe he states is improved as well. Still has some mild discomfort with walking long distances but overall has improved with swelling as well as the bruising. No new symptoms.  Past medical history, Surgical history, Family history not pertinant except as noted below, Social history, Allergies, and medications have been entered into the medical record, reviewed, and no changes needed.   Review of Systems: No fevers, chills, night sweats, weight loss, chest pain, or shortness of breath.   Objective:   Blood pressure 122/68, pulse 67, temperature 98.2 F (36.8 C), temperature source Oral, resp. rate 16, weight 181 lb 0.6 oz (82.119 kg), SpO2 96.00%.  General: Well Developed, well nourished, and in no acute distress.  Neuro: Alert and oriented x3, extra-ocular muscles intact, sensation grossly intact.  HEENT: Normocephalic, atraumatic, pupils equal round reactive to light, neck supple, no masses, no lymphadenopathy, thyroid nonpalpable.  Skin: Warm and dry, no rashes. Does have incisions on knees bilaterally for knee replacement surgery. Cardiac:  no lower extremity edema. Respiratory: Not using accessory muscles, speaking in full sentences. Abdominal: NT, soft Gait: Nonantlagic, good balance and coordination Lymphatic: no lymphadenopathy in neck or axillae on palpation, non tender.  Musculoskeletal: Inspection and palpation of the right and left upper extremities  including the shoulders elbows and wrist are unremarkable with full range of motion and good muscle strength and tone. Inspection and palpation of the right and left lower extremities including the hips knees and ankles are unremarkable and nontender with full range of motion and good muscle strength and tone and are symmetric. 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 He is neurovascularly intact with full range of motion. Good strength. No new findings Back Exam:  Inspection: Unremarkable  Motion: Flexion 45 deg, Extension 45 deg, Side Bending to 45 deg bilaterally,  Rotation to 45 deg bilaterally  SLR laying: Negative  XSLR laying: Negative  Palpable tenderness: None. FABER: negative. Sensory change: Gross sensation intact to all lumbar and sacral dermatomes.  Reflexes: 2+ at both patellar tendons, 2+ at achilles tendons, Babinski's downgoing.  Strength at foot  Plantar-flexion: 5/5 Dorsi-flexion: 5/5 Eversion: 5/5 Inversion: 5/5  Leg strength  Quad: 5/5 Hamstring: 5/5 Hip flexor: 5/5 Hip abductors: 5/5  Gait unremarkable.  OMT Findings:  Cervical: neutral  Thoracic: T3 extended rotated inside that right T 5 extended rotated and side bent right  Lumbar:  L2 F RS right,   Sacrum:  Right on right   Foot exam shows the patient swelling still is moderately tender over the second MTP. Is no numbness or tingling. Neurovascularly intact distally.  Impression and Recommendations:

## 2013-07-08 NOTE — Assessment & Plan Note (Signed)
Patient continues to do remarkably well with the severe case of spinal stenosis. Patient is responding osteopathic manipulation and staying very active which I think is helping his pain. Patient will can continue this and come back and see us again in 4-6 weeks for further evaluation and manipulation the

## 2013-07-08 NOTE — Assessment & Plan Note (Signed)
Seems to be healing well. Discuss continuing to buddy tape and wearing a rigid sole shoes. Patient continues have pain in 4 weeks we will ultrasound area again.

## 2013-07-08 NOTE — Patient Instructions (Signed)
Good to see you Continue icing the toe at least 1 time a day and buddy taping You are doing great with your back, maybe come back in 2 months.

## 2013-07-12 ENCOUNTER — Encounter: Payer: Self-pay | Admitting: Sports Medicine

## 2013-07-12 ENCOUNTER — Ambulatory Visit (INDEPENDENT_AMBULATORY_CARE_PROVIDER_SITE_OTHER): Payer: Medicare (Managed Care) | Admitting: Sports Medicine

## 2013-07-12 VITALS — BP 133/84 | Ht 69.0 in | Wt 182.0 lb

## 2013-07-12 DIAGNOSIS — S92919A Unspecified fracture of unspecified toe(s), initial encounter for closed fracture: Secondary | ICD-10-CM

## 2013-07-12 DIAGNOSIS — M109 Gout, unspecified: Secondary | ICD-10-CM

## 2013-07-12 NOTE — Progress Notes (Signed)
   Subjective:    Patient ID: Paul [name], male    DOB: [birth year], 79 y.o.   MRN: [id number]  HPI  [name] presents complaining of 5 weeks of swelling on the dorsum of the Right foot. He noticed non-painful swelling over the metatarsal joints on the dorsum of his foot. He does not recall an injury. Has not changed his diet or started new medicines. Denies swelling on the left foot. He takes clonidine and felodipine. He has a history of left knee replacement. He initially thought it might be a gout flare-up and took a 5 day course of Colchicine and noted some improvement in swelling.   He also stubbed his second toe after the swelling had started. He had pain in that toe but was still able to walk and move about. He sees Dr. Terrilee FilesZach Smith regularly for back issues and mentioned the toe during his last visit. At that time Dr. Katrinka BlazingSmith did an ultrasound of the toe and thought it was broken. He recommended taping the toes, which [name] has been doing.    He is active and his foot has not significantly impacted activity. He has orthotics which he got from Dr. Darrick PennaFields approximately 8 years ago, which he feels help.    Review of Systems Negative apart from HPI.      Objective:   Physical Exam  Fit and in NAD  BP 133/84  Ht 5\' 9"  (1.753 m)  Wt 182 lb (82.555 kg)  BMI 26.86 kg/m2   Foot Inspection: mild swelling and loss of flexor tendons and vessels on Right compared to left dorsum of foot near metatarsal joints. Ecchymosis on second digit of right foot Palpation: tenderness to palpation on second toe of right foot. No other tenderness noted ROM: normal bilaterally, normal strength  Legs Inspection: Decreased hair grown approximately 4 inches above the ankle on Right leg, some evidence of scaling skin changes on right worse than left Palpation: trace edema on Right lower leg. No calf tenderness to palpation     Assessment & Plan:  79 yo with a history of gouty arthritis who presents with  5 week history of painless foot swelling.  1. Foot swelling: Some medications that can cause edema, as well as signs of venous insufficiency which are worse on the right. Possibly lingering from gouty flare-up but at this time is non-painful.  --Arch band support to wear for a few weeks to help venous return  2. Broken second toe: diagnosed by Dr. Katrinka BlazingSmith --Continue taping --Can take antiinflammatories in small amounts if needed  Seen with Howell RucksJessica Rein, MS4/ edited as needed/  Sterling BigKB Ailene Royal, MD

## 2013-07-13 NOTE — Assessment & Plan Note (Signed)
Foot swelling may be sign of early gout change  Be aware and if pain occurs OK to restart the colchicine  Note he took 5 days and that did help his pain  Arch strap  Orthotic support

## 2013-07-13 NOTE — Assessment & Plan Note (Signed)
Healing seems progressive

## 2013-08-05 ENCOUNTER — Telehealth: Payer: Self-pay | Admitting: *Deleted

## 2013-08-05 MED ORDER — COLCHICINE 0.6 MG PO TABS: 0.6000 mg | ORAL_TABLET | Freq: Two times a day (BID) | ORAL | Status: DC | PRN

## 2013-08-05 NOTE — Telephone Encounter (Signed)
Refill done.  

## 2013-09-13 ENCOUNTER — Other Ambulatory Visit: Payer: Self-pay | Admitting: Family Medicine

## 2013-09-23 ENCOUNTER — Encounter: Payer: Self-pay | Admitting: Family Medicine

## 2013-09-23 ENCOUNTER — Ambulatory Visit (INDEPENDENT_AMBULATORY_CARE_PROVIDER_SITE_OTHER): Payer: Medicare (Managed Care) | Admitting: Family Medicine

## 2013-09-23 VITALS — BP 132/82 | HR 56 | Ht 69.0 in | Wt 179.0 lb

## 2013-09-23 DIAGNOSIS — M999 Biomechanical lesion, unspecified: Secondary | ICD-10-CM

## 2013-09-23 DIAGNOSIS — IMO0002 Reserved for concepts with insufficient information to code with codable children: Secondary | ICD-10-CM

## 2013-09-23 MED ORDER — DICLOFENAC SODIUM 2 % TD SOLN: 2.0000 "application " | Freq: Two times a day (BID) | TRANSDERMAL | Status: DC

## 2013-09-23 NOTE — Assessment & Plan Note (Signed)
Decision today to treat with OMT was based on Physical Exam  After verbal consent patient was treated with HVLA and ME techniques in thoracic, lumbar and sacral areas  Patient tolerated the procedure well with improvement in symptoms  Patient given exercises, stretches and lifestyle modifications  See medications in patient instructions if given  Patient will follow up in 6-8  weeks

## 2013-09-23 NOTE — Assessment & Plan Note (Signed)
Patient has continued to do very well and if anything we have seen reversal and no significant progression of his spinal stenosis. Patient has remained significantly active and if anything has gotten better and better shape. The patient encouraged to continue doing all activities 82 and regular basis and he will come back again in 2 months for further evaluation and treatment.  Spent greater than 25 minutes with patient face-to-face and had greater than 50% of counseling including as described above in assessment and plan.

## 2013-09-23 NOTE — Patient Instructions (Signed)
Always good to see you Good luck with the new 6 iron.  Try pennsiad topically on the foot and try the heel lifts.  Ice after activity can help Keep doing what you are doing.  See me again in 2 months

## 2013-09-23 NOTE — Progress Notes (Signed)
  Subjective:    CC: Back pain followup  HPI: Patient is a very pleasant 79 year old recently retired male coming back for osteopathic manipulation. Patient does have a past medical history for severe spinal stenosis. Patient has not been seen for 2 months and has noticed some increasing pain. Patient continues to do most of his exercises as well as playing golf 5-6 days a week if not more he states.. Patient continues to go to the gym 5 days a week. Patient has traveled to [place] as well as [place] and has been playing golf better with no pain. Patient's toe he states is improved as well. Still has some mild discomfort with walking long distances but overall has improved with swelling as well as the bruising. No new symptoms.  Past medical history, Surgical history, Family history not pertinant except as noted below, Social history, Allergies, and medications have been entered into the medical record, reviewed, and no changes needed.   Review of Systems: No fevers, chills, night sweats, weight loss, chest pain, or shortness of breath.   Objective:   Blood pressure 132/82, pulse 56, height 5\' 9"  (1.753 m), weight 179 lb (81.194 kg), SpO2 97.00%.  General: Well Developed, well nourished, and in no acute distress.  Neuro: Alert and oriented x3, extra-ocular muscles intact, sensation grossly intact.  HEENT: Normocephalic, atraumatic, pupils equal round reactive to light, neck supple, no masses, no lymphadenopathy, thyroid nonpalpable.  Skin: Warm and dry, no rashes. Does have incisions on knees bilaterally for knee replacement surgery. Cardiac:  no lower extremity edema. Respiratory: Not using accessory muscles, speaking in full sentences. Abdominal: NT, soft Gait: Nonantlagic, good balance and coordination Lymphatic: no lymphadenopathy in neck or axillae on palpation, non tender.  Musculoskeletal: Inspection and palpation of the right and left upper extremities including the shoulders elbows  and wrist are unremarkable with full range of motion and good muscle strength and tone. Inspection and palpation of the right and left lower extremities including the hips knees and ankles are unremarkable and nontender with full range of motion and good muscle strength and tone and are symmetric. 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 He is neurovascularly intact with full range of motion. Good strength. No new findings Back Exam:  Inspection: Unremarkable  Motion: Flexion 45 deg, Extension 45 deg, Side Bending to 45 deg bilaterally,  Rotation to 45 deg bilaterally  SLR laying: Negative  XSLR laying: Negative  Palpable tenderness: None. FABER: negative. Sensory change: Gross sensation intact to all lumbar and sacral dermatomes.  Reflexes: 2+ at both patellar tendons, 2+ at achilles tendons, Babinski's downgoing.  Strength at foot  Plantar-flexion: 5/5 Dorsi-flexion: 5/5 Eversion: 5/5 Inversion: 5/5  Leg strength  Quad: 5/5 Hamstring: 5/5 Hip flexor: 5/5 Hip abductors: 5/5  Gait unremarkable.  OMT Findings:  Cervical: neutral  Thoracic:  T3 extended rotated inside that right T 5 extended rotated and side bent right  Lumbar:  L2 F RS right,   Sacrum:  Right on right    Impression and Recommendations:

## 2013-09-29 ENCOUNTER — Other Ambulatory Visit: Payer: Self-pay | Admitting: Family Medicine

## 2013-10-20 ENCOUNTER — Other Ambulatory Visit: Payer: Self-pay | Admitting: Family Medicine

## 2013-11-22 ENCOUNTER — Other Ambulatory Visit: Payer: Self-pay | Admitting: Family Medicine

## 2013-11-23 ENCOUNTER — Ambulatory Visit (INDEPENDENT_AMBULATORY_CARE_PROVIDER_SITE_OTHER): Payer: Medicare (Managed Care) | Admitting: Family Medicine

## 2013-11-23 ENCOUNTER — Encounter: Payer: Self-pay | Admitting: Family Medicine

## 2013-11-23 VITALS — BP 136/80 | HR 56 | Ht 68.0 in | Wt 176.0 lb

## 2013-11-23 DIAGNOSIS — IMO0002 Reserved for concepts with insufficient information to code with codable children: Secondary | ICD-10-CM

## 2013-11-23 DIAGNOSIS — M999 Biomechanical lesion, unspecified: Secondary | ICD-10-CM

## 2013-11-23 NOTE — Progress Notes (Signed)
  Subjective:    CC: Back pain followup  HPI: Patient is a very pleasant 79 year old recently retired male coming back for osteopathic manipulation. Patient does have a past medical history for severe spinal stenosis Patient continues to be significantly active and is going out of town for a month long golfing trip to [place]. Patient continues to the gym a regular basis as well. Patient has actually lost some weight and is feeling better as well. Denies any new symptoms. Overall feeling relatively well.  Past medical history, Surgical history, Family history not pertinant except as noted below, Social history, Allergies, and medications have been entered into the medical record, reviewed, and no changes needed.   Review of Systems: No fevers, chills, night sweats, weight loss, chest pain, or shortness of breath.   Objective:   Blood pressure 136/80, pulse 56, height 5\' 8"  (1.727 m), weight 176 lb (79.833 kg), SpO2 97.00%.  General: Well Developed, well nourished, and in no acute distress.  Neuro: Alert and oriented x3, extra-ocular muscles intact, sensation grossly intact.  HEENT: Normocephalic, atraumatic, pupils equal round reactive to light, neck supple, no masses, no lymphadenopathy, thyroid nonpalpable.  Skin: Warm and dry, no rashes. Does have incisions on knees bilaterally for knee replacement surgery. Cardiac:  no lower extremity edema. Respiratory: Not using accessory muscles, speaking in full sentences. Abdominal: NT, soft Gait: Nonantlagic, good balance and coordination Lymphatic: no lymphadenopathy in neck or axillae on palpation, non tender.  Musculoskeletal: Inspection and palpation of the right and left upper extremities including the shoulders elbows and wrist are unremarkable with full range of motion and good muscle strength and tone. Inspection and palpation of the right and left lower extremities including the hips knees and ankles are unremarkable and nontender with  full range of motion and good muscle strength and tone and are symmetric. 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 He is neurovascularly intact with full range of motion. Good strength. No new findings Back Exam:  Inspection: Unremarkable  Motion: Flexion 45 deg, Extension 45 deg, Side Bending to 45 deg bilaterally,  Rotation to 45 deg bilaterally  SLR laying: Negative  XSLR laying: Negative  Palpable tenderness: None. FABER: negative. Sensory change: Gross sensation intact to all lumbar and sacral dermatomes.  Reflexes: 2+ at both patellar tendons, 2+ at achilles tendons, Babinski's downgoing.  Strength at foot  Plantar-flexion: 5/5 Dorsi-flexion: 5/5 Eversion: 5/5 Inversion: 5/5  Leg strength  Quad: 5/5 Hamstring: 5/5 Hip flexor: 5/5 Hip abductors: 5/5  Gait unremarkable.  OMT Findings:  Cervical: neutral  Thoracic:  T3 extended rotated inside that right T 5 extended rotated and side bent right  Lumbar:  L2 F RS right,   Sacrum:  Right on right  No significant change from previous exam.   Impression and Recommendations:

## 2013-11-23 NOTE — Assessment & Plan Note (Signed)
Decision today to treat with OMT was based on Physical Exam  After verbal consent patient was treated with HVLA and ME techniques in thoracic, lumbar and sacral areas  Patient tolerated the procedure well with improvement in symptoms  Patient given exercises, stretches and lifestyle modifications  See medications in patient instructions if given  Patient will follow up in 8  weeks

## 2013-11-23 NOTE — Assessment & Plan Note (Signed)
Patient continues to do remarkably well at this time. Encourage him to continue to stay as active as possible. Continue the over-the-counter medications were discussed previously. Patient will continue home exercises and come back and see me again in 2 months.

## 2013-11-23 NOTE — Patient Instructions (Signed)
You are amazing!!! Ice is your friend.  Pennsaid twice daily as you need it.  Compression sleeves or socks to your knees with flying.   Also every 2 hours get up and walk.  Continue the exercises  See Dr. Mauricio PoBreen  He will get you fixed up See me again in 8 weeks and have fun in [place]!

## 2014-01-19 ENCOUNTER — Encounter (INDEPENDENT_AMBULATORY_CARE_PROVIDER_SITE_OTHER): Payer: Self-pay

## 2014-01-19 ENCOUNTER — Ambulatory Visit (INDEPENDENT_AMBULATORY_CARE_PROVIDER_SITE_OTHER): Payer: PRIVATE HEALTH INSURANCE | Admitting: Family Medicine

## 2014-01-19 ENCOUNTER — Encounter: Payer: Self-pay | Admitting: Family Medicine

## 2014-01-19 VITALS — BP 130/78 | HR 50 | Ht 69.0 in | Wt 178.0 lb

## 2014-01-19 DIAGNOSIS — M5137 Other intervertebral disc degeneration, lumbosacral region: Secondary | ICD-10-CM

## 2014-01-19 DIAGNOSIS — M51379 Other intervertebral disc degeneration, lumbosacral region without mention of lumbar back pain or lower extremity pain: Secondary | ICD-10-CM

## 2014-01-19 DIAGNOSIS — M999 Biomechanical lesion, unspecified: Secondary | ICD-10-CM

## 2014-01-19 NOTE — Assessment & Plan Note (Signed)
Decision today to treat with OMT was based on Physical Exam  After verbal consent patient was treated with HVLA and ME techniques in thoracic, lumbar and sacral areas  Patient tolerated the procedure well with improvement in symptoms  Patient given exercises, stretches and lifestyle modifications  See medications in patient instructions if given  Patient will follow up in 8-10 weeks         

## 2014-01-19 NOTE — Patient Instructions (Signed)
You are still amazing.  Making it too easy With your golf swings try to do 1 set of 20 lefty Continue what you are doing because it works.  Pennsaid when you need it.  For your knee lets watch it and see if it acts up then we will look at it. We can consider a brace.  Come back in 8-10 weeks. Or when you need me.

## 2014-01-19 NOTE — Assessment & Plan Note (Signed)
Patient is defining the perfect patient. Patient continues with the exercises as well as the over-the-counter medications and uses a topical anti-inflammatory sparingly. Patient continues to be more active than most people have his age. Discuss with him that we will continue with the current therapy and make no changes. Patient will followup and see me again in 8-10 weeks for further evaluation and treatment.

## 2014-01-19 NOTE — Progress Notes (Signed)
  Subjective:    CC: Back pain followup  HPI: Patient is a very pleasant 79 year old recently retired male coming back for osteopathic manipulation. Patient does have a past medical history for severe spinal stenosis Condition continues to be significantly active. Patient does come back from a trip to [place] where he played golf pretty much every day averaging 18-36 holes and walking most the time. Patient states as long as he did his stretching he felt very well. Denies any radiation of pain no leg or any numbness. Patient states sometimes at night she can have some mild discomfort in her foot but does not contributing from the back. Patient states overall though he continues to do all his exercises and continues to take vitamins and is doing overall significantly good. Patient denies any other symptoms other than what previously was stated.  Past medical history, Surgical history, Family history not pertinant except as noted below, Social history, Allergies, and medications have been entered into the medical record, reviewed, and no changes needed.   Review of Systems: No fevers, chills, night sweats, weight loss, chest pain, or shortness of breath.   Objective:   Blood pressure 130/78, pulse 50, height  (1.753 m), weight 178 lb (80.74 kg), SpO2 99.00%.  General: Well Developed, well nourished, and in no acute distress.  Neuro: Alert and oriented x3, extra-ocular muscles intact, sensation grossly intact.  HEENT: Normocephalic, atraumatic, pupils equal round reactive to light, neck supple, no masses, no lymphadenopathy, thyroid nonpalpable.  Skin: Warm and dry, no rashes. Does have incisions on knees bilaterally for knee replacement surgery. Cardiac:  no lower extremity edema. Respiratory: Not using accessory muscles, speaking in full sentences. Abdominal: NT, soft Gait: Nonantlagic, good balance and coordination Lymphatic: no lymphadenopathy in neck or axillae on palpation, non tender.   Musculoskeletal: Inspection and palpation of the right and left upper extremities including the shoulders elbows and wrist are unremarkable with full range of motion and good muscle strength and tone. Inspection and palpation of the right and left lower extremities including the hips knees and ankles are unremarkable and nontender with full range of motion and good muscle strength and tone and are symmetric. 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 He is neurovascularly intact with full range of motion. Good strength. No new findings Back Exam:  Inspection: Unremarkable  Motion: Flexion 45 deg, Extension 45 deg, Side Bending to 45 deg bilaterally,  Rotation to 45 deg bilaterally  SLR laying: Negative  XSLR laying: Negative  Palpable tenderness: None. FABER: negative. Sensory change: Gross sensation intact to all lumbar and sacral dermatomes.  Reflexes: 2+ at both patellar tendons, 2+ at achilles tendons, Babinski's downgoing.  Strength at foot  Plantar-flexion: 5/5 Dorsi-flexion: 5/5 Eversion: 5/5 Inversion: 5/5  Leg strength  Quad: 5/5 Hamstring: 5/5 Hip flexor: 5/5 Hip abductors: 5/5  Gait unremarkable.  OMT Findings:  Cervical: neutral  Thoracic:  T3 extended rotated inside that right T 5 extended rotated and side bent right  Lumbar:  L2 F RS right,   Sacrum:  Right on right  No significant change from previous exam.   Impression and Recommendations:

## 2014-01-21 ENCOUNTER — Other Ambulatory Visit: Payer: Self-pay | Admitting: Family Medicine

## 2014-03-16 ENCOUNTER — Ambulatory Visit (INDEPENDENT_AMBULATORY_CARE_PROVIDER_SITE_OTHER): Payer: PRIVATE HEALTH INSURANCE | Admitting: Family Medicine

## 2014-03-16 ENCOUNTER — Encounter: Payer: Self-pay | Admitting: Family Medicine

## 2014-03-16 VITALS — BP 134/86 | HR 56 | Ht 69.0 in | Wt 180.0 lb

## 2014-03-16 DIAGNOSIS — M9902 Segmental and somatic dysfunction of thoracic region: Secondary | ICD-10-CM

## 2014-03-16 DIAGNOSIS — M5137 Other intervertebral disc degeneration, lumbosacral region: Secondary | ICD-10-CM

## 2014-03-16 DIAGNOSIS — M9904 Segmental and somatic dysfunction of sacral region: Secondary | ICD-10-CM

## 2014-03-16 DIAGNOSIS — M9903 Segmental and somatic dysfunction of lumbar region: Secondary | ICD-10-CM

## 2014-03-16 DIAGNOSIS — M999 Biomechanical lesion, unspecified: Secondary | ICD-10-CM

## 2014-03-16 MED ORDER — KETOCONAZOLE 2 % EX CREA: 1.0000 "application " | TOPICAL_CREAM | Freq: Every day | CUTANEOUS | Status: DC

## 2014-03-16 NOTE — Progress Notes (Signed)
  Subjective:    CC: Back pain followup  HPI: Patient is a very pleasant 79 year old recently retired male coming back for osteopathic manipulation. Patient does have a past medical history for severe spinal stenosis Condition continues to be significantly active. Patient continue to be active.  Continue to play golf most of the time. At least 3-4 times a week.  No new symptoms, back is doing well, no radiation down the leg. No numbness or weakness.  Still resting comfortably   Past medical history, Surgical history, Family history not pertinant except as noted below, Social history, Allergies, and medications have been entered into the medical record, reviewed, and no changes needed.   Review of Systems: No fevers, chills, night sweats, weight loss, chest pain, or shortness of breath.   Objective:   Blood pressure 134/86, pulse 56, height 5\' 9"  (1.753 m), weight 180 lb (81.647 kg), SpO2 96 %.  General: Well Developed, well nourished, and in no acute distress.  Neuro: Alert and oriented x3, extra-ocular muscles intact, sensation grossly intact.  HEENT: Normocephalic, atraumatic, pupils equal round reactive to light, neck supple, no masses, no lymphadenopathy, thyroid nonpalpable.  Skin: Warm and dry, no rashes. Does have incisions on knees bilaterally for knee replacement surgery. Cardiac:  no lower extremity edema. Respiratory: Not using accessory muscles, speaking in full sentences. Abdominal: NT, soft Gait: Nonantlagic, good balance and coordination Lymphatic: no lymphadenopathy in neck or axillae on palpation, non tender.  Musculoskeletal: Inspection and palpation of the right and left upper extremities including the shoulders elbows and wrist are unremarkable with full range of motion and good muscle strength and tone. Inspection and palpation of the right and left lower extremities including the hips knees and ankles are unremarkable and nontender with full range of motion and good  muscle strength and tone and are symmetric. 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 He is neurovascularly intact with full range of motion. Good strength. No new findings Back Exam:  Inspection: Unremarkable  Motion: Flexion 45 deg, Extension 45 deg, Side Bending to 45 deg bilaterally,  Rotation to 45 deg bilaterally  SLR laying: Negative  XSLR laying: Negative  Palpable tenderness: None. FABER: negative. Sensory change: Gross sensation intact to all lumbar and sacral dermatomes.  Reflexes: 2+ at both patellar tendons, 2+ at achilles tendons, Babinski's downgoing.  Strength at foot  Plantar-flexion: 5/5 Dorsi-flexion: 5/5 Eversion: 5/5 Inversion: 5/5  Leg strength  Quad: 5/5 Hamstring: 5/5 Hip flexor: 5/5 Hip abductors 4/5  Gait unremarkable.  OMT Findings:  Cervical: neutral  Thoracic:  T3 extended rotated inside that right T 5 extended rotated and side bent right   Lumbar:  L2 F RS right,  L4 F RS left.   Sacrum:  Right on right     Impression and Recommendations:

## 2014-03-16 NOTE — Assessment & Plan Note (Signed)
Patient does have spinal stenosis. Continue to do well with conservative therapy.  Given posture exercises Discussed other exercises if he wants to have some different regimen.  See me again in 2-3 months.

## 2014-03-16 NOTE — Patient Instructions (Addendum)
Good to see you as always.  Continue what you are doing.  You make my job too easy.  COnitnue to watch the skin we can always try a cream if you want  See me in 2-3 months Happy Holidays.

## 2014-03-16 NOTE — Assessment & Plan Note (Signed)
Decision today to treat with OMT was based on Physical Exam  After verbal consent patient was treated with HVLA and ME techniques in thoracic, lumbar and sacral areas  Patient tolerated the procedure well with improvement in symptoms  Patient given exercises, stretches and lifestyle modifications  See medications in patient instructions if given  Patient will follow up in 8-10 weeks

## 2014-03-20 ENCOUNTER — Other Ambulatory Visit: Payer: Self-pay | Admitting: Family Medicine

## 2014-03-20 DIAGNOSIS — N183 Chronic kidney disease, stage 3 unspecified: Secondary | ICD-10-CM

## 2014-03-22 NOTE — Telephone Encounter (Signed)
Patient needs BMet for further refills beyond the 30-day ACEI prescription on this note.  JB

## 2014-03-28 ENCOUNTER — Other Ambulatory Visit: Payer: Self-pay | Admitting: Family Medicine

## 2014-04-04 ENCOUNTER — Other Ambulatory Visit: Payer: Medicare Other

## 2014-04-04 ENCOUNTER — Ambulatory Visit: Payer: Medicare Other

## 2014-04-04 DIAGNOSIS — N183 Chronic kidney disease, stage 3 unspecified: Secondary | ICD-10-CM

## 2014-04-04 LAB — BASIC METABOLIC PANEL
BUN: 27 mg/dL — ABNORMAL HIGH (ref 6–23)
CO2: 28 mEq/L (ref 19–32)
Calcium: 9.1 mg/dL (ref 8.4–10.5)
Chloride: 103 mEq/L (ref 96–112)
Creat: 1.34 mg/dL (ref 0.50–1.35)
Glucose, Bld: 89 mg/dL (ref 70–99)
Potassium: 4.2 mEq/L (ref 3.5–5.3)
Sodium: 140 mEq/L (ref 135–145)

## 2014-04-04 NOTE — Progress Notes (Signed)
BMP DONE TODAY Lateria Alderman 

## 2014-04-10 ENCOUNTER — Encounter: Payer: Self-pay | Admitting: Family Medicine

## 2014-04-11 ENCOUNTER — Encounter: Payer: Medicare Other | Admitting: Family Medicine

## 2014-04-14 ENCOUNTER — Ambulatory Visit (INDEPENDENT_AMBULATORY_CARE_PROVIDER_SITE_OTHER): Payer: PRIVATE HEALTH INSURANCE | Admitting: Family Medicine

## 2014-04-14 ENCOUNTER — Encounter: Payer: Self-pay | Admitting: Family Medicine

## 2014-04-14 VITALS — BP 150/80 | HR 56 | Temp 98.6°F | Ht 69.0 in | Wt 177.6 lb

## 2014-04-14 DIAGNOSIS — I10 Essential (primary) hypertension: Secondary | ICD-10-CM

## 2014-04-14 DIAGNOSIS — I781 Nevus, non-neoplastic: Secondary | ICD-10-CM

## 2014-04-14 DIAGNOSIS — Z8739 Personal history of other diseases of the musculoskeletal system and connective tissue: Secondary | ICD-10-CM

## 2014-04-14 MED ORDER — CLONIDINE HCL 0.3 MG/24HR TD PTWK: MEDICATED_PATCH | TRANSDERMAL | Status: DC

## 2014-04-14 MED ORDER — TRAMADOL HCL 50 MG PO TABS: 50.0000 mg | ORAL_TABLET | Freq: Two times a day (BID) | ORAL | Status: DC | PRN

## 2014-04-14 MED ORDER — LISINOPRIL 40 MG PO TABS: 40.0000 mg | ORAL_TABLET | Freq: Every day | ORAL | Status: DC

## 2014-04-14 MED ORDER — FELODIPINE ER 10 MG PO TB24: 10.0000 mg | ORAL_TABLET | Freq: Every day | ORAL | Status: DC

## 2014-04-14 NOTE — Assessment & Plan Note (Signed)
Well controlled on current therapy. No changes.  Continue to monitor renal function, just checked and remains stable.  Nuanced discussion of cardiac risk and lipid panel; he has done well with pravastatin and expresses reluctance to change to atorva or rosuvastatin. We agree to continue current treatment and control modifiable risk factors.

## 2014-04-14 NOTE — Progress Notes (Signed)
   Subjective:    Patient ID: Paul RoanDavid [name], male    DOB: [birth year], 79 y.o.   MRN: [id number]  HPI Patient here for CPE; he has a few issues which he would like to cover as well.  Medication list reviewed. He had labs done here recently in order to refill his lisinopril. CKD is stable, no changes since a year ago. No side effects with lisinopril.   Has been doing a lot of core strength work, pushups and sit-ups with weights.  Sees Dr Terrilee FilesZach Smith for OMT for his spinal stenosis. Is able to golf frequently.   Was washing floor with chlorox bleach solution about 1 month ago, breathed in a lot of chlorox and has had rhinorrhea since. Is resolving. No shortness of breath or cough.   Is concerned about a mole on his nose, requests Derm referral.   Social Hx Retired professor. Quit smoking 41 years ago.    ROS: No fevers or chills, no unexplained weight loss, no cough, no shortness of breath, no chest pain, no abdominal pain, no changes in bowel habits. No blood per rectum. Does void 2-3x/night, saw his urologist in May 2015 and no problems. S/p TURP for BPH 3 years ago.    Review of Systems     Objective:   Physical Exam Well appearing, no acute distress HEENT neck supple. Hyperpigmented nevus along lateral aspect of L naris. Mild central erythema of face. TMs clear bilaterally. Nasal mucosa intact and clear. No facial tenderness.  COR Regular S1S2, no extra sounds PULM Clear bilaterally  ABD Soft, nontender, nondistended. Audible bowel sounds.  EXTS: no notable edema noted in ankles.  NEURO gait unremarkable. No assistance       Assessment & Plan:

## 2014-04-14 NOTE — Assessment & Plan Note (Signed)
Occasional flare of acute gouty arthritis in left 4th PIP joint when he eats shellfish. Takes colchicine and resolves. Has not taken in awhile. Expresses no need for refill (has at home).

## 2014-04-14 NOTE — Patient Instructions (Signed)
It was a pleasure to see you today.  No med changes.   Call and leave a message of the name of the dermatologist to whom you'd like to be referred for skin survey/spot on left nasal ala.

## 2014-04-19 ENCOUNTER — Ambulatory Visit (INDEPENDENT_AMBULATORY_CARE_PROVIDER_SITE_OTHER): Payer: PRIVATE HEALTH INSURANCE | Admitting: Sports Medicine

## 2014-04-19 VITALS — BP 153/88 | HR 54 | Ht 69.0 in | Wt 177.0 lb

## 2014-04-19 DIAGNOSIS — M1990 Unspecified osteoarthritis, unspecified site: Secondary | ICD-10-CM

## 2014-04-19 DIAGNOSIS — M19049 Primary osteoarthritis, unspecified hand: Secondary | ICD-10-CM

## 2014-04-19 NOTE — Patient Instructions (Signed)
You have Patellofemoral Pain Syndrome in your Right Knee (irritation from worn out cartilage under your kneecap) - Continue current exercise / weight regimen and golfing - Modify squats STOP at 45* knee flexion - avoid all weight bearing exercises with knees bent < 45 degrees - If sitting for prolonged period of time, move and straighten knee occasionally  Your Left Thumb / Wrist pain is caused by "Scapholunate Instability" and likely some arthritis changes as well - Try the wrist/thumb device given today to support it, if it does not interfere with your golf swing you may wear it - Use it during weight lifting to protector thumb for next 4-6 weeks, and then only as needed

## 2014-04-19 NOTE — Progress Notes (Signed)
Subjective:    Patient ID: Paul [name], male    DOB: [birth year], 79 y.o.   MRN: [id number]  HPI  RIGHT KNEE PAIN: - Reports current complaint of intermittent Right knee pain over past 3-4 months. Prior history without significant Right knee injuries, no surgeries. Admits to history of Left knee total replacement about 7 years ago, due to osteoarthritis. Describes current R-knee pain as brief episodes as "burning, sharp pain within knee" lasting several seconds, usually occurs 1-2x daily, seems to be triggered by prolonged sitting with knee flexed (often while driving and using cruise control) and relief with straightening leg, sometimes aggravated while sleeping on Right side. Continues to remain active with weight lifting (upper and lower extremities), walking, and frequent golfing. Pain does not interfere with these activities. Has not tried any medicine, topicals, exercises, heat or ice. - Denies any associated swelling, weakness, numbness, or tingling  LEFT THUMB PAIN: - Reports chronic intermittent history of Left thumb pain, denies any distinct injury, prior X-rays 09/2012 reveal scapholunate widening concerning for ligamentous injury. Current episode with mild pain localized at inner base of thumb for past 2-3 days, no swelling or associated symptoms, seemed to flare-up while playing golf, used ice on thumb with improvement. No significant pain today. Continues to do weight lifting, concerned about continuing these activities.  I have reviewed and updated the following as appropriate: allergies and current medications  Social Hx: - Former smoker  Review of Systems  See above HPI    Objective:   Physical Exam  BP 153/88 mmHg  Pulse 54  Ht 5\' 9"  (1.753 m)  Wt 177 lb (80.287 kg)  BMI 26.13 kg/m2  Gen - well-appearing elderly M, NAD, athletic MSK: - Right Knee: normal appearance (Left with evidence of TKR) no effusion or erythema, full active ROM, no crepitus, no significant  patellar grinding with ROM except +grinding with ROM <45 flexion, patella mobile, +pain with patellar compression otherwise non-tender to palpation, ligamentous testing negative with stable ACL, MCL, LCL, McMurray's negative. Muscle str 5/5 quads, hamstring, hip abd - Left Thumb: normal appearance, full active ROM without pain, no significant tenderness over anatomical snuff box, mild tenderness localized over body of scaphoid on thumb flexion, negative finklestein's test; no swelling;  Watson test slight pain but no dysfunctional movement. Ext - peripheral pulses intact +2 b/l Neuro - intact distal sensation to light touch, gait normal     Assessment & Plan:   [age] yr M with known history of OA s/p L-TKR, presents for recent intermittent Right Knee pain over past 4 months and acute Left thumb / wrist pain for 3 days. History and exam suggestive of Patellofemoral Pain Syndrome for Right Knee, in setting of likely chronic activity and age related cartilage wear, seems to be provoked by squats during work-out and imbalance with hamstrings > quads str.  2.  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  Plan: 1. Recommend wearing Right knee strap to stabilize patella 2. Avoid weight lifting exercises with knee flexion < 45 degrees (squats), advised if continues should keep knees >45 flexion, and frequent straightening knee while driving or sitting for prolonged periods 3. Left wrist body helix compression sleeve and thumb strap given for support of scapholunate joint 4. RTC PRN  Saralyn PilarAlexander Suresh Audi, DO [facility], PGY-2  Agree with evaluation and plan.  Gerome ApleyK B Fields, MD

## 2014-05-09 ENCOUNTER — Ambulatory Visit: Payer: Medicare Other | Admitting: Sports Medicine

## 2014-05-22 ENCOUNTER — Ambulatory Visit (INDEPENDENT_AMBULATORY_CARE_PROVIDER_SITE_OTHER): Payer: Medicare Other | Admitting: Family Medicine

## 2014-05-22 ENCOUNTER — Telehealth: Payer: Self-pay | Admitting: Family Medicine

## 2014-05-22 ENCOUNTER — Encounter: Payer: Self-pay | Admitting: Family Medicine

## 2014-05-22 VITALS — BP 169/104 | HR 60 | Temp 98.3°F | Wt 182.0 lb

## 2014-05-22 DIAGNOSIS — M1000 Idiopathic gout, unspecified site: Secondary | ICD-10-CM

## 2014-05-22 DIAGNOSIS — M109 Gout, unspecified: Secondary | ICD-10-CM

## 2014-05-22 LAB — URIC ACID: Uric Acid, Serum: 6.8 mg/dL (ref 4.0–7.8)

## 2014-05-22 MED ORDER — PREDNISONE 10 MG PO TABS: ORAL_TABLET | ORAL | Status: DC

## 2014-05-22 MED ORDER — COLCHICINE 0.6 MG PO TABS: 0.6000 mg | ORAL_TABLET | Freq: Two times a day (BID) | ORAL | Status: DC | PRN

## 2014-05-22 NOTE — Patient Instructions (Signed)
Thank you for coming in, today!  I do think this is gout in your toe. I want you to continue the colchicine twice a day for another week. I also want you to take prednisone 30 mg for a week, then taper off of it. I will check a uric acid level today. That's the chemical in your blood that causes gout.  Come back in about 2-3 weeks to see Dr. Mauricio PoBreen. You can talk to him at that point to see what, if any, medicine changes need to be made.  Please feel free to call with any questions or concerns at any time, at [phone]. --Dr. Casper HarrisonStreet

## 2014-05-22 NOTE — Progress Notes (Signed)
   Subjective:    Patient ID: Paul RoanDavid [name], male    DOB: July 02, 1934, 80 y.o.   MRN: [id number]  HPI: Pt presents to clinic for SDA for right great toe redness, swelling, and pain; pt concerned for flare of gout. Initially he thought he might have injured it at the gym. Symptoms have been present for about 1 week, with increased pain with walking / step on his right foot. He has had gout in his left ring finger in the past, which has had some similar but much milder symptoms with this pain in his toe; he has never had podagra before. He has been taking colchicine twice per day which significantly helped his left ring finger, and his toe symptoms have been improved today more than the past few days. He denies frank systemic symptoms, but does have some coryza-type symptoms and had temp of 101 in the past week, which has resolved with drinking plenty of fluids and "giving it time." He feels his blood pressure has been higher due to his foot pain. He denies frank chest pain, SOB, N/V, abdominal pain, or muscle / body aches, otherwise. Pt has used prednisone in the past with flares of gout in his finger, which helped significantly.  Review of Systems: As above.     Objective:   Physical Exam BP 169/104 mmHg  Pulse 60  Temp(Src) 98.3 F (36.8 C) (Oral)  Wt 182 lb (82.555 kg) Gen: well-appearing adult male in NAD HEENT: [place]/AT, EOMI, MMM  Posterior oropharynx mildly red without tonsillar swelling / exudates Cardio: RRR, no murmur Pulm: CTAB, no wheezes Ext: warm, well-perfused, no LE edema pretibial or in ankles Feet: right great toe MTP joint swollen and mildly red compared to other joints in right foot  Definite increased swelling of right MTP joints of 1st and second toes compared to left  Tenderness to palpation over right 1st MTP joint over area of swelling  No streaking or frank cellulitic-appearing skin of right foot, no broken skin or ulceration     Assessment & Plan:  80yo male with hx  of gout with likely gout flare in right great toe; first episode of podagra to pt's knowledge - recommended continue colchicine BID for at least 1 more week, Rx refilled - Rx for prednisone taper (30 mg x 7 days, then 20 mg x3 days, then 10 mg x3 days, then stop) - avoiding NSAIDs due to history of CKD - will check uric acid level today (last checked about 6 years ago, >10) for baseline - advised pt to f/u with PCP Dr. Mauricio PoBreen in 2-3 weeks to discuss whether medication like allopurinol or chronic prophylaxis with colchicine should be started - recommended f/u sooner if symptoms worsen or recur, or if systemic symptoms develop  Note FYI to Dr. Florestine AversBreen  Anapaola Kinsel M Lawrencia Mauney, MD PGY-3, [facility] 05/22/2014, 2:12 PM

## 2014-05-22 NOTE — Telephone Encounter (Signed)
Toe pain, gout, patient given medication.

## 2014-05-22 NOTE — Telephone Encounter (Signed)
Patient called requesting a call back. He states it is in reference to his big toe and would not offer any further information. CB# [phone]

## 2014-06-07 ENCOUNTER — Telehealth: Payer: Self-pay | Admitting: Family Medicine

## 2014-06-07 NOTE — Telephone Encounter (Signed)
Pt called and would like to speak to Dr. Mauricio PoBreen concerning his lab results and also last visit with Dr. Casper HarrisonStreet. He has some questions and feels that only Dr. Mauricio PoBreen can answer them. Myriam Jacobsonjw

## 2014-06-09 ENCOUNTER — Telehealth: Payer: Self-pay | Admitting: Family Medicine

## 2014-06-09 DIAGNOSIS — J3489 Other specified disorders of nose and nasal sinuses: Secondary | ICD-10-CM | POA: Insufficient documentation

## 2014-06-09 DIAGNOSIS — Z8739 Personal history of other diseases of the musculoskeletal system and connective tissue: Secondary | ICD-10-CM

## 2014-06-09 MED ORDER — FLUTICASONE PROPIONATE 50 MCG/ACT NA SUSP: 2.0000 | Freq: Every day | NASAL | Status: DC

## 2014-06-09 MED ORDER — ALLOPURINOL 100 MG PO TABS: 50.0000 mg | ORAL_TABLET | Freq: Every day | ORAL | Status: DC

## 2014-06-09 NOTE — Telephone Encounter (Signed)
Returned patient's call.  He reports that his gouty flare resolved after taking the prednisone.  No longer with pain in the foot.  Has never been on a preventive medicine for gout.  Based on most recent BMet, his GFR is calculated to be around 56.  The most recent uric acid reading was done during a gouty flare and thus is not a reliable assessment of his baseline uric acid level. Historically with levels of 9 or 10.  Plan to start renally-adjusted dose of allopurinol 50mg  daily, recheck uric acid in 3 or 4 weeks. Target uric acid level is 6.  I will contact him with the results of this test.  Also complains of recurrent watery rhinorrhea which he noticed first after an inhalation of chlorox in December 2015.  This resolved, but then he had a recurrence of the rhinorrhea recently after a cold.  Plan to try nasal steroids for at least two weeks. JB

## 2014-06-09 NOTE — Telephone Encounter (Signed)
Pt is calling back to speak to Dr, Mauricio PoBreen he missed the doctors call. Myriam Jacobsonjw

## 2014-06-09 NOTE — Telephone Encounter (Signed)
Called patient back to cell [phone].  Left voice message.  JB

## 2014-06-19 ENCOUNTER — Ambulatory Visit: Payer: Medicare Other | Admitting: Family Medicine

## 2014-06-30 ENCOUNTER — Ambulatory Visit (INDEPENDENT_AMBULATORY_CARE_PROVIDER_SITE_OTHER): Payer: Medicare Other | Admitting: Family Medicine

## 2014-06-30 ENCOUNTER — Encounter: Payer: Self-pay | Admitting: Family Medicine

## 2014-06-30 VITALS — BP 130/82 | HR 61 | Ht 69.0 in | Wt 178.0 lb

## 2014-06-30 DIAGNOSIS — M9902 Segmental and somatic dysfunction of thoracic region: Secondary | ICD-10-CM

## 2014-06-30 DIAGNOSIS — M9903 Segmental and somatic dysfunction of lumbar region: Secondary | ICD-10-CM

## 2014-06-30 DIAGNOSIS — F5232 Male orgasmic disorder: Secondary | ICD-10-CM

## 2014-06-30 DIAGNOSIS — M5137 Other intervertebral disc degeneration, lumbosacral region: Secondary | ICD-10-CM

## 2014-06-30 DIAGNOSIS — M9904 Segmental and somatic dysfunction of sacral region: Secondary | ICD-10-CM

## 2014-06-30 DIAGNOSIS — M999 Biomechanical lesion, unspecified: Secondary | ICD-10-CM

## 2014-06-30 MED ORDER — SILDENAFIL CITRATE 20 MG PO TABS: 20.0000 mg | ORAL_TABLET | Freq: Three times a day (TID) | ORAL | Status: DC

## 2014-06-30 NOTE — Assessment & Plan Note (Signed)
Patient overall is doing fairly well. We discussed continuing the home exercises in the icing protocol. We discussed chest lungs patient stays as well as see is doing he just needs to take tramadol as needed. We discussed icing regimen.  Patient's will come back and see me in 3 months his lungs patient continues to do well.

## 2014-06-30 NOTE — Patient Instructions (Addendum)
Great to see you You are doing great! Almost unbelievable Concentrate some on the eccentric of your shoulder exercises but may need to decrease reps at first.  Paul [name] is still your friend Continue the vitamins See me in 3 months.

## 2014-06-30 NOTE — Assessment & Plan Note (Signed)
Filled patient's medications and discussed potential side effects. Patient verbalized understanding.

## 2014-06-30 NOTE — Progress Notes (Signed)
Pre visit review using our clinic review tool, if applicable. No additional management support is needed unless otherwise documented below in the visit note. 

## 2014-06-30 NOTE — Assessment & Plan Note (Signed)
Decision today to treat with OMT was based on Physical Exam  After verbal consent patient was treated with HVLA and ME techniques in thoracic, lumbar and sacral areas  Patient tolerated the procedure well with improvement in symptoms  Patient given exercises, stretches and lifestyle modifications  See medications in patient instructions if given  Patient will follow up in 12 weeks             

## 2014-06-30 NOTE — Progress Notes (Signed)
  Subjective:    CC: Back pain followup  HPI: Patient is a very pleasant 80 year old recently retired male coming back for osteopathic manipulation. Patient does have a past medical history for severe spinal stenosis Condition continues to be significantly active. Patient continue to be active.  Continue to play golf most of the time. At least 5-6 times a week.  . Patient is been seen for 3 months because he has been doing so well.  No new symptoms, back is doing well, no radiation down the leg. No numbness or weakness.  Still resting comfortably.   Past medical history, Surgical history, Family history not pertinant except as noted below, Social history, Allergies, and medications have been entered into the medical record, reviewed, and no changes needed.   Review of Systems: No fevers, chills, night sweats, weight loss, chest pain, or shortness of breath.   Objective:   Blood pressure 130/82, pulse 61, height 5\' 9"  (1.753 m), weight 178 lb (80.74 kg), SpO2 98 %.  General: Well Developed, well nourished, and in no acute distress.  Neuro: Alert and oriented x3, extra-ocular muscles intact, sensation grossly intact.  HEENT: Normocephalic, atraumatic, pupils equal round reactive to light, neck supple, no masses, no lymphadenopathy, thyroid nonpalpable.  Skin: Warm and dry, no rashes. Does have incisions on knees bilaterally for knee replacement surgery. Cardiac:  no lower extremity edema. Respiratory: Not using accessory muscles, speaking in full sentences. Abdominal: NT, soft Gait: Nonantlagic, good balance and coordination Lymphatic: no lymphadenopathy in neck or axillae on palpation, non tender.  Musculoskeletal: Inspection and palpation of the right and left upper extremities including the shoulders elbows and wrist are unremarkable with full range of motion and good muscle strength and tone. Inspection and palpation of the right and left lower extremities including the hips knees and  ankles are unremarkable and nontender with full range of motion and good muscle strength and tone and are symmetric. 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 He is neurovascularly intact with full range of motion. Good strength. No new findings Back Exam:  Inspection: Unremarkable  Motion: Flexion 45 deg, Extension 45 deg, Side Bending to 45 deg bilaterally,  Rotation to 45 deg bilaterally  SLR laying: Negative  XSLR laying: Negative  Palpable tenderness: None. FABER: negative. Sensory change: Gross sensation intact to all lumbar and sacral dermatomes.  Reflexes: 2+ at both patellar tendons, 2+ at achilles tendons, Babinski's downgoing.  Strength at foot  Plantar-flexion: 5/5 Dorsi-flexion: 5/5 Eversion: 5/5 Inversion: 5/5  Leg strength  Quad: 5/5 Hamstring: 5/5 Hip flexor: 5/5 Hip abductors 4/5  Gait unremarkable.  OMT Findings:  Cervical: neutral  Thoracic:  T3 extended rotated inside that right inhaled third rib T 5 extended rotated and side bent right   Lumbar:  L2 F RS right,  L4 F RS left.   Sacrum:  Right on right     Impression and Recommendations:

## 2014-07-24 ENCOUNTER — Telehealth: Payer: Self-pay | Admitting: Family Medicine

## 2014-07-24 DIAGNOSIS — L98499 Non-pressure chronic ulcer of skin of other sites with unspecified severity: Secondary | ICD-10-CM

## 2014-07-24 DIAGNOSIS — Z8739 Personal history of other diseases of the musculoskeletal system and connective tissue: Secondary | ICD-10-CM

## 2014-07-24 NOTE — Telephone Encounter (Signed)
Had a gout outbreak last week Pharmacist told him to stop taking alpurenol or something like that Gout is much better. Is taking colchicine. Wanted to let dr breen know and see what he thought about it

## 2014-07-27 NOTE — Telephone Encounter (Signed)
Left message on voicemail for patient to call back. 

## 2014-07-27 NOTE — Telephone Encounter (Signed)
I agree with holding allopurinol and taking colchicinne during flare.  Please call and tell pt. Paula ComptonJames Zakkery Dorian, MD  I called patient (July 31, 2014) to inquire about his gouty flare and to report result of uric acid, which was drawn at conclusion of a flare last Friday April 1st. His most recent GFR 56.  Today he says flare is "95% resolved".  Plan to hold colchicine and increase allopurinol to 100mg  daily as maintenance medication.  Repeat uric acid in 6-8 weeks when quiescent, to see if uric acid below target of 6.   He adds that he has had eruption of ulcerative lesions on scalp and is concerned, wants referral to dermatologist.  [place] a particular dermatologist in [place] to whom would like to be referred. Is advised that there are often waits to get appointments with dermatologiy.  JB

## 2014-07-28 ENCOUNTER — Other Ambulatory Visit: Payer: Medicare Other

## 2014-07-28 DIAGNOSIS — Z8739 Personal history of other diseases of the musculoskeletal system and connective tissue: Secondary | ICD-10-CM

## 2014-07-28 NOTE — Progress Notes (Signed)
[facility] phlebotomist drew:  Uric acid

## 2014-07-28 NOTE — Telephone Encounter (Signed)
Would like a call once labs drawn today are back, will forward to PCP

## 2014-07-28 NOTE — Telephone Encounter (Signed)
Patient informed while in office,expressed understanding.

## 2014-07-29 LAB — URIC ACID: Uric Acid, Serum: 7.6 mg/dL (ref 4.0–7.8)

## 2014-07-31 DIAGNOSIS — L98499 Non-pressure chronic ulcer of skin of other sites with unspecified severity: Secondary | ICD-10-CM | POA: Insufficient documentation

## 2014-07-31 MED ORDER — ALLOPURINOL 100 MG PO TABS: 100.0000 mg | ORAL_TABLET | Freq: Every day | ORAL | Status: DC

## 2014-08-04 ENCOUNTER — Ambulatory Visit: Payer: Medicare Other | Admitting: Family Medicine

## 2014-10-02 ENCOUNTER — Encounter: Payer: Self-pay | Admitting: Family Medicine

## 2014-10-02 ENCOUNTER — Ambulatory Visit (INDEPENDENT_AMBULATORY_CARE_PROVIDER_SITE_OTHER): Payer: Medicare Other | Admitting: Family Medicine

## 2014-10-02 VITALS — BP 136/84 | HR 57 | Ht 69.0 in | Wt 180.0 lb

## 2014-10-02 DIAGNOSIS — M9903 Segmental and somatic dysfunction of lumbar region: Secondary | ICD-10-CM | POA: Diagnosis not present

## 2014-10-02 DIAGNOSIS — M9902 Segmental and somatic dysfunction of thoracic region: Secondary | ICD-10-CM

## 2014-10-02 DIAGNOSIS — M5137 Other intervertebral disc degeneration, lumbosacral region: Secondary | ICD-10-CM | POA: Diagnosis not present

## 2014-10-02 DIAGNOSIS — M9904 Segmental and somatic dysfunction of sacral region: Secondary | ICD-10-CM | POA: Diagnosis not present

## 2014-10-02 DIAGNOSIS — M999 Biomechanical lesion, unspecified: Secondary | ICD-10-CM

## 2014-10-02 NOTE — Assessment & Plan Note (Signed)
Decision today to treat with OMT was based on Physical Exam  After verbal consent patient was treated with HVLA and ME techniques in thoracic, lumbar and sacral areas  Patient tolerated the procedure well with improvement in symptoms  Patient given exercises, stretches and lifestyle modifications  See medications in patient instructions if given  Patient will follow up in 12 weeks

## 2014-10-02 NOTE — Assessment & Plan Note (Addendum)
She is doing remarkably well at this time. Encourage home exercises and icing protocol. Patient will call if he has any radicular symptoms earlier on. Patient and will come back and see me again in 3 months.

## 2014-10-02 NOTE — Progress Notes (Signed)
  Subjective:    CC: Back pain followup  HPI: Patient is a very pleasant 80 year old recently retired male coming back for osteopathic manipulation. Patient does have a past medical history for severe spinal stenosis Condition continues to be significantly active. Patient continue to be active.  Continue to play golf most of the time. At least 5-6 times a week.  . Patient is been seen for 3 months because he has been doing so well.  No new symptoms, back is doing well, no radiation down the leg. No numbness or weakness.  Still resting comfortably. Patient has noticed some increase in strength and is continuing to do very well. Patient is also doing his own lawn care and is very happy with the results. Patient states active and is long as he stays active he feels better.  Past medical history, Surgical history, Family history not pertinant except as noted below, Social history, Allergies, and medications have been entered into the medical record, reviewed, and no changes needed.   Review of Systems: No fevers, chills, night sweats, weight loss, chest pain, or shortness of breath.   Objective:   Blood pressure 136/84, pulse 57, height 5\' 9"  (1.753 m), weight 180 lb (81.647 kg), SpO2 98 %.  General: Well Developed, well nourished, and in no acute distress.  Neuro: Alert and oriented x3, extra-ocular muscles intact, sensation grossly intact.  HEENT: Normocephalic, atraumatic, pupils equal round reactive to light, neck supple, no masses, no lymphadenopathy, thyroid nonpalpable.  Skin: Warm and dry, no rashes. Does have incisions on knees bilaterally for knee replacement surgery. Cardiac:  no lower extremity edema. Respiratory: Not using accessory muscles, speaking in full sentences. Abdominal: NT, soft Gait: Nonantlagic, good balance and coordination Lymphatic: no lymphadenopathy in neck or axillae on palpation, non tender.  Musculoskeletal: Inspection and palpation of the right and left upper  extremities including the shoulders elbows and wrist are unremarkable with full range of motion and good muscle strength and tone. Inspection and palpation of the right and left lower extremities including the hips knees and ankles are unremarkable and nontender with full range of motion and good muscle strength and tone and are symmetric. 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 He is neurovascularly intact with full range of motion. Good strength. No new findings Back Exam:  Inspection: Unremarkable  Motion: Flexion 45 deg, Extension 45 deg, Side Bending to 45 deg bilaterally,  Rotation to 45 deg bilaterally  SLR laying: Negative  XSLR laying: Negative  Palpable tenderness: None. FABER: negative. Sensory change: Gross sensation intact to all lumbar and sacral dermatomes.  Reflexes: 2+ at both patellar tendons, 2+ at achilles tendons, Babinski's downgoing.  Strength at foot  Plantar-flexion: 5/5 Dorsi-flexion: 5/5 Eversion: 5/5 Inversion: 5/5  Leg strength  Quad: 5/5 Hamstring: 5/5 Hip flexor: 5/5 Hip abductors 5/5  Gait unremarkable.improved strength from previous exam  OMT Findings:  Cervical: neutral  Thoracic:  T3 extended rotated inside that inhaled rib in place T 5 extended rotated and side bent right   Lumbar:  L2 F RS right,  L4 F RS left.   Sacrum:  Right on right     Impression and Recommendations:

## 2014-10-02 NOTE — Progress Notes (Signed)
Pre visit review using our clinic review tool, if applicable. No additional management support is needed unless otherwise documented below in the visit note. 

## 2014-10-02 NOTE — Patient Instructions (Addendum)
Good to see you Ice when you need it.  Have your friend look at Dr. Tana ConchStephen Hunter.  I got nothing new for you at all You are doing great.  Lets continue 3 months.

## 2014-10-03 ENCOUNTER — Other Ambulatory Visit: Payer: Self-pay | Admitting: Family Medicine

## 2014-10-21 ENCOUNTER — Other Ambulatory Visit: Payer: Self-pay | Admitting: Family Medicine

## 2014-10-24 ENCOUNTER — Other Ambulatory Visit: Payer: Self-pay | Admitting: Family Medicine

## 2014-10-27 ENCOUNTER — Telehealth: Payer: Self-pay | Admitting: Family Medicine

## 2014-10-27 NOTE — Telephone Encounter (Signed)
Patient is requesting a call back in regards to a MD reference from Dr. Katrinka BlazingSmith

## 2014-10-31 NOTE — Telephone Encounter (Signed)
Discussed with pt

## 2014-12-07 ENCOUNTER — Encounter: Payer: Self-pay | Admitting: Sports Medicine

## 2014-12-07 ENCOUNTER — Ambulatory Visit (INDEPENDENT_AMBULATORY_CARE_PROVIDER_SITE_OTHER): Payer: Medicare Other | Admitting: Sports Medicine

## 2014-12-07 VITALS — BP 155/70 | HR 57 | Ht 69.0 in | Wt 180.0 lb

## 2014-12-07 DIAGNOSIS — Z8739 Personal history of other diseases of the musculoskeletal system and connective tissue: Secondary | ICD-10-CM | POA: Diagnosis not present

## 2014-12-07 DIAGNOSIS — M1990 Unspecified osteoarthritis, unspecified site: Secondary | ICD-10-CM

## 2014-12-07 DIAGNOSIS — M19049 Primary osteoarthritis, unspecified hand: Secondary | ICD-10-CM

## 2014-12-07 NOTE — Assessment & Plan Note (Signed)
I think he has some minor arthritis at the first MCP joint With excessive golfing he may put pressure on the spot  He will try KT taping to protect this at first

## 2014-12-07 NOTE — Assessment & Plan Note (Signed)
I reassured him today that do not think he has any signs of gout  I believe he has lost some of the fat pad  We added a dancers pad  I gave him some additional pads to try in his golf and other shoes  He went me know how this works

## 2014-12-07 NOTE — Progress Notes (Signed)
Patient ID: Paul [name], male   DOB: 1934/10/03, 80 y.o.   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  A second problem he is pain in the muscles between the thumb and index finger of the left hand No specific injury to this  Physical exam No acute distress BP 155/70 mmHg  Pulse 57  Ht  (1.753 m)  Wt 180 lb (81.647 kg)  BMI 26.57 kg/m2  Right great toe has lost a lot of padding under the most medial of the sesamoid bones This area is slightly tender The joint is not red or swollen His foot shows collapse of the longitudinal arch and pronation at the first MTP  Left hand shows a very small spur deep, medial base of the first MCP joint The muscle appears normal Both were scanned with ultrasound

## 2015-01-03 ENCOUNTER — Encounter: Payer: Self-pay | Admitting: Family Medicine

## 2015-01-03 ENCOUNTER — Ambulatory Visit (INDEPENDENT_AMBULATORY_CARE_PROVIDER_SITE_OTHER): Payer: Medicare Other | Admitting: Family Medicine

## 2015-01-03 VITALS — BP 132/80 | HR 56 | Ht 69.0 in | Wt 183.0 lb

## 2015-01-03 DIAGNOSIS — M9903 Segmental and somatic dysfunction of lumbar region: Secondary | ICD-10-CM

## 2015-01-03 DIAGNOSIS — M9902 Segmental and somatic dysfunction of thoracic region: Secondary | ICD-10-CM | POA: Diagnosis not present

## 2015-01-03 DIAGNOSIS — M9904 Segmental and somatic dysfunction of sacral region: Secondary | ICD-10-CM | POA: Diagnosis not present

## 2015-01-03 DIAGNOSIS — M5137 Other intervertebral disc degeneration, lumbosacral region: Secondary | ICD-10-CM

## 2015-01-03 DIAGNOSIS — M51379 Other intervertebral disc degeneration, lumbosacral region without mention of lumbar back pain or lower extremity pain: Secondary | ICD-10-CM

## 2015-01-03 DIAGNOSIS — M999 Biomechanical lesion, unspecified: Secondary | ICD-10-CM

## 2015-01-03 NOTE — Assessment & Plan Note (Signed)
Patient is done remarkably well at this time. No radicular symptoms whatsoever. Patient spinal stenosis will be something we'll need to monitor. I do not think that any other changes at this point will be necessary. Patient will continue the same indications, over-the-counter natural supplements, home exercises as well as stretching. We discussed ergonomics and proper lifting mechanics again. Patient and will come back and see me again in 2-3 months.

## 2015-01-03 NOTE — Progress Notes (Signed)
Pre visit review using our clinic review tool, if applicable. No additional management support is needed unless otherwise documented below in the visit note. 

## 2015-01-03 NOTE — Progress Notes (Signed)
  Subjective:    CC: Back pain followup  HPI: Patient is a very pleasant 80 year old recently retired male coming back for osteopathic manipulation. Patient does have a past medical history for severe spinal stenosis Condition continues to be significantly active. Patient continue to be active.  Continue to play golf most of the time. At least 5-6 times a week.  Patient has been traveling a lot as well as driving a lot. His lungs he stays active which means biking multiple times a week and does his exercises multiple times a week he does very well. No new symptoms.  Past medical history, Surgical history, Family history not pertinant except as noted below, Social history, Allergies, and medications have been entered into the medical record, reviewed, and no changes needed.   Review of Systems: No fevers, chills, night sweats, weight loss, chest pain, or shortness of breath.   Objective:   Blood pressure 132/80, pulse 56, height  (1.753 m), weight 183 lb (83.008 kg), SpO2 98 %.  General: Well Developed, well nourished, and in no acute distress.  Neuro: Alert and oriented x3, extra-ocular muscles intact, sensation grossly intact.  HEENT: Normocephalic, atraumatic, pupils equal round reactive to light, neck supple, no masses, no lymphadenopathy, thyroid nonpalpable.  Skin: Warm and dry, no rashes. Does have incisions on knees bilaterally for knee replacement surgery. Cardiac:  no lower extremity edema. Respiratory: Not using accessory muscles, speaking in full sentences. Abdominal: NT, soft Gait: Nonantlagic, good balance and coordination Lymphatic: no lymphadenopathy in neck or axillae on palpation, non tender.  Musculoskeletal: Inspection and palpation of the right and left upper extremities including the shoulders elbows and wrist are unremarkable with full range of motion and good muscle strength and tone. Inspection and palpation of the right and left lower extremities including the  hips knees and ankles are unremarkable and nontender with full range of motion and good muscle strength and tone and are symmetric.  Back Exam:  Inspection: Unremarkable  Motion: Flexion 45 deg, Extension 45 deg, Side Bending to 45 deg bilaterally,  Rotation to 45 deg bilaterally  SLR laying: Negative  XSLR laying: Negative  Palpable tenderness: None. FABER: negative. Sensory change: Gross sensation intact to all lumbar and sacral dermatomes.  Reflexes: 2+ at both patellar tendons, 2+ at achilles tendons, Babinski's downgoing.  Strength at foot  Plantar-flexion: 5/5 Dorsi-flexion: 5/5 Eversion: 5/5 Inversion: 5/5  Leg strength  Quad: 5/5 Hamstring: 5/5 Hip flexor: 5/5 Hip abductors 5/5  Gait unremarkable.  OMT Findings:  Cervical:  C2 flexed rotated and side bent right Thoracic:  T3 extended rotated inside that inhaled rib in place   Lumbar:  L2 F RS right,  L4 F RS left.   Sacrum:  Right on right     Impression and Recommendations:

## 2015-01-03 NOTE — Patient Instructions (Addendum)
Great to see you as always.  Hit the links Stay active and do what you do best.  No changes  Enjoy the fall See me again in 2 months.

## 2015-01-03 NOTE — Assessment & Plan Note (Signed)
Decision today to treat with OMT was based on Physical Exam  After verbal consent patient was treated with HVLA and ME techniques in thoracic, lumbar and sacral areas  Patient tolerated the procedure well with improvement in symptoms  Patient given exercises, stretches and lifestyle modifications  See medications in patient instructions if given  Patient will follow up in 8-12 weeks

## 2015-02-21 ENCOUNTER — Other Ambulatory Visit: Payer: Self-pay | Admitting: Family Medicine

## 2015-03-05 ENCOUNTER — Encounter: Payer: Self-pay | Admitting: Family Medicine

## 2015-03-05 ENCOUNTER — Ambulatory Visit (INDEPENDENT_AMBULATORY_CARE_PROVIDER_SITE_OTHER): Payer: Medicare Other | Admitting: Family Medicine

## 2015-03-05 VITALS — BP 140/84 | HR 59 | Ht 69.0 in | Wt 183.0 lb

## 2015-03-05 DIAGNOSIS — M9902 Segmental and somatic dysfunction of thoracic region: Secondary | ICD-10-CM | POA: Diagnosis not present

## 2015-03-05 DIAGNOSIS — S025XXA Fracture of tooth (traumatic), initial encounter for closed fracture: Secondary | ICD-10-CM

## 2015-03-05 DIAGNOSIS — M9903 Segmental and somatic dysfunction of lumbar region: Secondary | ICD-10-CM | POA: Diagnosis not present

## 2015-03-05 DIAGNOSIS — M5137 Other intervertebral disc degeneration, lumbosacral region: Secondary | ICD-10-CM

## 2015-03-05 DIAGNOSIS — M9904 Segmental and somatic dysfunction of sacral region: Secondary | ICD-10-CM | POA: Diagnosis not present

## 2015-03-05 DIAGNOSIS — M999 Biomechanical lesion, unspecified: Secondary | ICD-10-CM

## 2015-03-05 MED ORDER — AMOXICILLIN-POT CLAVULANATE 875-125 MG PO TABS: 1.0000 | ORAL_TABLET | Freq: Two times a day (BID) | ORAL | Status: DC

## 2015-03-05 NOTE — Patient Instructions (Addendum)
Great to see you  Keep making everyone else look bad.  You should be impressed.  Remember stretching after repeptive activity On wall with heels, butt shoulder and head touching for a goal of 5 minutes daily  Keep working on the core Can't thank you enough for the green [place] Augmentin for the tooth for 10 days 2 times a day Make an appointment in 1 month.

## 2015-03-05 NOTE — Progress Notes (Signed)
  Subjective:    CC: Back pain followup  HPI: Patient is a very pleasant 80 year old recently retired male coming back for osteopathic manipulation. Patient does have a past medical history for severe spinal stenosis Continues to do well. Increasing tightness recently because he did build a new deck.  He was in a flexed position for a long time. Mild tightness. Otherwise unremarkable. No radicular symptoms down the legs.  Patient also has had some tooth pain on the right side. Patient is scheduled to see his dentist in a couple weeks. Wanted to be evaluated toe case there is any sign of infection. Denies any fevers or chills. Seems to be having worsening pain.  Past medical history, Surgical history, Family history not pertinant except as noted below, Social history, Allergies, and medications have been entered into the medical record, reviewed, and no changes needed.   Review of Systems: No fevers, chills, night sweats, weight loss, chest pain, or shortness of breath.   Objective:   Blood pressure 140/84, pulse 59, height 5\' 9"  (1.753 m), weight 183 lb (83.008 kg), SpO2 97 %.  General: Well Developed, well nourished, and in no acute distress.  Neuro: Alert and oriented x3, extra-ocular muscles intact, sensation grossly intact.  HEENT: Normocephalic, atraumatic, pupils equal round reactive to light, neck supple, no masses, no lymphadenopathy, thyroid nonpalpable.  Exam shows patient does have what appears to be a crack to's of the posterior molar on the inferior side. No sign of infection at this time. Tender to palpation on the cheek on the outside. Skin: Warm and dry, no rashes. Does have incisions on knees bilaterally for knee replacement surgery. Cardiac:  no lower extremity edema. Respiratory: Not using accessory muscles, speaking in full sentences. Abdominal: NT, soft Gait: Nonantlagic, good balance and coordination Lymphatic: no lymphadenopathy in neck or axillae on palpation, non  tender.  Musculoskeletal: Inspection and palpation of the right and left upper extremities including the shoulders elbows and wrist are unremarkable with full range of motion and good muscle strength and tone. Inspection and palpation of the right and left lower extremities including the hips knees and ankles are unremarkable and nontender with full range of motion and good muscle strength and tone and are symmetric.  Back Exam:  Inspection: Unremarkable  Motion: Flexion 45 deg, Extension 25 deg, Side Bending to 35 deg bilaterally,  Rotation to 45 deg bilaterally  SLR laying: Negative  XSLR laying: Negative  Palpable tenderness: Mild increased tightness and tenderness of the pairs spinal musculature of the lumbar spine bilaterally. FABER: negative. Sensory change: Gross sensation intact to all lumbar and sacral dermatomes.  Reflexes: 2+ at both patellar tendons, 2+ at achilles tendons, Babinski's downgoing.  Strength at foot  Plantar-flexion: 5/5 Dorsi-flexion: 5/5 Eversion: 5/5 Inversion: 5/5  Leg strength  Quad: 5/5 Hamstring: 5/5 Hip flexor: 5/5 Hip abductors 5/5  Gait unremarkable.  OMT Findings:  Cervical:  C2 flexed rotated and side bent right Thoracic:  T3 extended rotated inside that inhaled rib in place  Lumbar:  L2 F RS right,  L4 F RS left.  Sacrum:  Right on right     Impression and Recommendations:

## 2015-03-05 NOTE — Assessment & Plan Note (Signed)
Patient overall is doing relatively well. Encourage him to continue to stay active. We discussed stretches to do after activity. Patient will try to make these changes and come back and see me again in 3-4 weeks for further evaluation and treatment.

## 2015-03-05 NOTE — Progress Notes (Signed)
Pre visit review using our clinic review tool, if applicable. No additional management support is needed unless otherwise documented below in the visit note. 

## 2015-04-03 ENCOUNTER — Other Ambulatory Visit: Payer: Self-pay | Admitting: Family Medicine

## 2015-04-04 ENCOUNTER — Ambulatory Visit (INDEPENDENT_AMBULATORY_CARE_PROVIDER_SITE_OTHER): Payer: Medicare Other | Admitting: Family Medicine

## 2015-04-04 ENCOUNTER — Encounter: Payer: Self-pay | Admitting: Family Medicine

## 2015-04-04 VITALS — BP 132/84 | HR 58 | Ht 69.0 in | Wt 185.0 lb

## 2015-04-04 DIAGNOSIS — M999 Biomechanical lesion, unspecified: Secondary | ICD-10-CM

## 2015-04-04 DIAGNOSIS — M9903 Segmental and somatic dysfunction of lumbar region: Secondary | ICD-10-CM | POA: Diagnosis not present

## 2015-04-04 DIAGNOSIS — M9902 Segmental and somatic dysfunction of thoracic region: Secondary | ICD-10-CM

## 2015-04-04 DIAGNOSIS — M9904 Segmental and somatic dysfunction of sacral region: Secondary | ICD-10-CM

## 2015-04-04 NOTE — Patient Instructions (Signed)
Good to see you! Ice is your friend Thanks for the green chiles Stay active See me again when you need me Happy holidays!

## 2015-04-04 NOTE — Assessment & Plan Note (Signed)
Patient overall seems to be doing relatively well. We discussed icing regimen. Patient continues to respond well to osteopathic manipulation. We discussed the importance of hip abductor strengthening exercises and showed proper technique. Patient will come back and see me again in 6-8 weeks.

## 2015-04-04 NOTE — Progress Notes (Signed)
  Subjective:    CC: Back pain followup  HPI: Patient is a very pleasant 80 year old recently retired male coming back for osteopathic manipulation. Patient does have a past medical history for severe spinal stenosis Patient has noticed some mild soreness in his right hip. Nothing that his stopping him from activity. States me uncomfortable but continues to do all activities. No radiation of the legs. No weakness. 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  Patient also has had some tooth pain on the right side. Patient states that this was resolved after but was found to have a cracked tooth which is what we felt was working diagnosis.  Past medical history, Surgical history, Family history not pertinant except as noted below, Social history, Allergies, and medications have been entered into the medical record, reviewed, and no changes needed.   Review of Systems: No fevers, chills, night sweats, weight loss, chest pain, or shortness of breath.   Objective:   Blood pressure 132/84, pulse 58, height 5\' 9"  (1.753 m), weight 185 lb (83.915 kg), SpO2 98 %.  General: Well Developed, well nourished, and in no acute distress.  Neuro: Alert and oriented x3, extra-ocular muscles intact, sensation grossly intact.  HEENT: Normocephalic, atraumatic, pupils equal round reactive to light, neck supple, no masses, no lymphadenopathy, thyroid nonpalpable.  Exam shows patient does have what appears to be a crack to's of the posterior molar on the inferior side. No sign of infection at this time. Tender to palpation on the cheek on the outside. Skin: Warm and dry, no rashes. Does have incisions on knees bilaterally for knee replacement surgery. Cardiac:  no lower extremity edema. Respiratory: Not using accessory muscles, speaking in full sentences. Abdominal: NT, soft Gait: Nonantlagic, good balance and coordination Lymphatic: no lymphadenopathy in neck or axillae on palpation, non tender.   Musculoskeletal: Inspection and palpation of the right and left upper extremities including the shoulders elbows and wrist are unremarkable with full range of motion and good muscle strength and tone. Inspection and palpation of the right and left lower extremities including the hips knees and ankles are unremarkable and nontender with full range of motion and good muscle strength and tone and are symmetric.  Back Exam:  Inspection: Unremarkable  Motion: Flexion 40 deg, Extension 25 deg, Side Bending to 35 deg bilaterally,  Rotation to 35 deg bilaterally mild increasing tightness from patient's baseline SLR laying: Negative  XSLR laying: Negative  Palpable tenderness: Mild increased tightness and tenderness of the pairs spinal musculature of the lumbar spine bilaterally. Even more so than previous exam. FABER: negative. Sensory change: Gross sensation intact to all lumbar and sacral dermatomes.  Reflexes: 2+ at both patellar tendons, 2+ at achilles tendons, Babinski's downgoing.  Strength at foot  Plantar-flexion: 5/5 Dorsi-flexion: 5/5 Eversion: 5/5 Inversion: 5/5  Leg strength  Quad: 5/5 Hamstring: 5/5 Hip flexor: 5/5 Hip abductors 5/5  Gait unremarkable.  OMT Findings:  Cervical:  C2 flexed rotated and side bent right Thoracic:  T3 extended rotated and side bent right T8 extended rotated and side bent right Lumbar:  L2 F RS right,  L4 F RS left.  Sacrum:  Right on right     Impression and Recommendations:

## 2015-04-04 NOTE — Assessment & Plan Note (Signed)
Decision today to treat with OMT was based on Physical Exam  After verbal consent patient was treated with HVLA and ME techniques in thoracic, lumbar and sacral areas  Patient tolerated the procedure well with improvement in symptoms  Patient given exercises, stretches and lifestyle modifications  See medications in patient instructions if given  Patient will follow up in 6-12 weeks

## 2015-04-04 NOTE — Progress Notes (Signed)
Pre visit review using our clinic review tool, if applicable. No additional management support is needed unless otherwise documented below in the visit note. 

## 2015-04-05 ENCOUNTER — Other Ambulatory Visit: Payer: Self-pay | Admitting: *Deleted

## 2015-04-10 ENCOUNTER — Telehealth: Payer: Self-pay | Admitting: Family Medicine

## 2015-04-10 MED ORDER — POLYETHYLENE GLYCOL 3350 17 GM/SCOOP PO POWD: ORAL | Status: DC

## 2015-04-10 NOTE — Telephone Encounter (Signed)
Spoke with patient and he is aware of this.  Patient was placed on the waiting list at his request for a sooner appt if possible.  Jazmin Hartsell,CMA

## 2015-04-10 NOTE — Telephone Encounter (Signed)
Pt called to set up his PHY, he would like to see if the doctor can put orders in for his yearly labs before the appointment so that the doctor can go over the labs with him. Please call patient and let him know when to schedule the appointment. jw

## 2015-04-10 NOTE — Telephone Encounter (Signed)
Dear Cliffton AstersWhite Team Please let him know I prefer to see him---decide what I need---and order it at the time. When I get it back I send him a note with my personal summary of what that means.  I know that may be different from the way his previous physicians have done it---everyone has their own way---this is mine. I do not routinely order the same labs on everyone---I customize it so I get exactly what I need for that person and nothing unnecessary,  So --I prefer to see him. We can discuss further at his visit THANKS! Denny LevySara Mikenzie Mccannon

## 2015-05-02 ENCOUNTER — Ambulatory Visit (INDEPENDENT_AMBULATORY_CARE_PROVIDER_SITE_OTHER): Payer: Medicare Other | Admitting: Family Medicine

## 2015-05-02 ENCOUNTER — Encounter: Payer: Self-pay | Admitting: Family Medicine

## 2015-05-02 VITALS — BP 144/76 | HR 55 | Temp 97.8°F | Ht 69.0 in | Wt 183.9 lb

## 2015-05-02 DIAGNOSIS — E039 Hypothyroidism, unspecified: Secondary | ICD-10-CM

## 2015-05-02 DIAGNOSIS — E785 Hyperlipidemia, unspecified: Secondary | ICD-10-CM

## 2015-05-02 DIAGNOSIS — Z79899 Other long term (current) drug therapy: Secondary | ICD-10-CM | POA: Diagnosis not present

## 2015-05-02 DIAGNOSIS — Z125 Encounter for screening for malignant neoplasm of prostate: Secondary | ICD-10-CM

## 2015-05-02 DIAGNOSIS — N183 Chronic kidney disease, stage 3 unspecified: Secondary | ICD-10-CM

## 2015-05-02 DIAGNOSIS — S025XXA Fracture of tooth (traumatic), initial encounter for closed fracture: Secondary | ICD-10-CM

## 2015-05-02 DIAGNOSIS — F5232 Male orgasmic disorder: Secondary | ICD-10-CM

## 2015-05-02 DIAGNOSIS — I10 Essential (primary) hypertension: Secondary | ICD-10-CM

## 2015-05-02 DIAGNOSIS — J4520 Mild intermittent asthma, uncomplicated: Secondary | ICD-10-CM

## 2015-05-02 DIAGNOSIS — N3941 Urge incontinence: Secondary | ICD-10-CM

## 2015-05-02 DIAGNOSIS — Z9079 Acquired absence of other genital organ(s): Secondary | ICD-10-CM

## 2015-05-02 DIAGNOSIS — Z8739 Personal history of other diseases of the musculoskeletal system and connective tissue: Secondary | ICD-10-CM

## 2015-05-02 LAB — CBC WITH DIFFERENTIAL/PLATELET
Basophils Absolute: 0 10*3/uL (ref 0.0–0.1)
Basophils Relative: 0 % (ref 0–1)
Eosinophils Absolute: 0.1 10*3/uL (ref 0.0–0.7)
Eosinophils Relative: 2 % (ref 0–5)
HCT: 42 % (ref 39.0–52.0)
Hemoglobin: 14.1 g/dL (ref 13.0–17.0)
Lymphocytes Relative: 40 % (ref 12–46)
Lymphs Abs: 2.3 10*3/uL (ref 0.7–4.0)
MCH: 27.6 pg (ref 26.0–34.0)
MCHC: 33.6 g/dL (ref 30.0–36.0)
MCV: 82.4 fL (ref 78.0–100.0)
MPV: 11.7 fL (ref 8.6–12.4)
Monocytes Absolute: 0.6 10*3/uL (ref 0.1–1.0)
Monocytes Relative: 11 % (ref 3–12)
Neutro Abs: 2.7 10*3/uL (ref 1.7–7.7)
Neutrophils Relative %: 47 % (ref 43–77)
Platelets: 172 10*3/uL (ref 150–400)
RBC: 5.1 MIL/uL (ref 4.22–5.81)
RDW: 15.4 % (ref 11.5–15.5)
WBC: 5.7 10*3/uL (ref 4.0–10.5)

## 2015-05-02 LAB — LIPID PANEL
Cholesterol: 201 mg/dL — ABNORMAL HIGH (ref 125–200)
HDL: 73 mg/dL (ref >=40)
LDL Cholesterol: 116 mg/dL (ref <130)
Total CHOL/HDL Ratio: 2.8 Ratio (ref <=5.0)
Triglycerides: 58 mg/dL (ref <150)
VLDL: 12 mg/dL (ref <30)

## 2015-05-02 LAB — COMPREHENSIVE METABOLIC PANEL
ALT: 16 U/L (ref 9–46)
AST: 21 U/L (ref 10–35)
Albumin: 4.2 g/dL (ref 3.6–5.1)
Alkaline Phosphatase: 51 U/L (ref 40–115)
BUN: 27 mg/dL — ABNORMAL HIGH (ref 7–25)
CO2: 28 mmol/L (ref 20–31)
Calcium: 9.7 mg/dL (ref 8.6–10.3)
Chloride: 102 mmol/L (ref 98–110)
Creat: 1.42 mg/dL — ABNORMAL HIGH (ref 0.70–1.18)
Glucose, Bld: 94 mg/dL (ref 65–99)
Potassium: 4.9 mmol/L (ref 3.5–5.3)
Sodium: 140 mmol/L (ref 135–146)
Total Bilirubin: 0.7 mg/dL (ref 0.2–1.2)
Total Protein: 7.6 g/dL (ref 6.1–8.1)

## 2015-05-02 LAB — URIC ACID: Uric Acid, Serum: 6.9 mg/dL (ref 4.0–7.8)

## 2015-05-02 LAB — TSH: TSH: 5.69 u[IU]/mL — ABNORMAL HIGH (ref 0.350–4.500)

## 2015-05-02 NOTE — Patient Instructions (Signed)
I will send you a note in the mail with all of your lab results and my personal summary of their meaning for you. Have any questions, please give me a call and leave a message with my nurse and I'll get back to you. You are incredibly healthy! Keep on golfing and doing well you're doing. All of her health maintenance is up-to-date as of today.

## 2015-05-03 ENCOUNTER — Encounter: Payer: Self-pay | Admitting: Family Medicine

## 2015-05-03 LAB — PSA, MEDICARE: PSA: 3.78 ng/mL (ref <=4.00)

## 2015-05-03 NOTE — Progress Notes (Signed)
   Subjective:    Patient ID: Paul RoanDavid Valletta, male    DOB: [birth year], 81 y.o.   MRN: [id number]  HPI The patient to my practice for CPE and to establish care. #1. Fractured tooth. Has appointment next week for dental surgery. Evidently they have to repeat root canal and do an implant. #2. Dermatitis in the scalp. Was seen by rheumatology. Recommended head and shoulder shampoo. Has some questions. #3. Hypertension: No shortness of breath, no chest pain, no palpitations, no headaches area taking medicines fairly without any problems. #4. Hyperlipidemia: Has been on Pravachol for several years. No problems. #5. Erectile dysfunction: Uses sildenafil with good effect. Does not need any refills. #6. History of CK D stage III. Asymptomatic #7. Multiple areas of arthralgia and arthritis.  ACTIVITY: He continues to golf daily.   Review of Systems  Constitutional: Negative for fever, chills, activity change, appetite change and unexpected weight change.  HENT: Positive for dental problem. Negative for ear pain and facial swelling.   Eyes: Negative for photophobia, pain and visual disturbance.  Respiratory: Positive for shortness of breath. Negative for cough and chest tightness.   Cardiovascular: Negative for chest pain and leg swelling.  Gastrointestinal: Negative for abdominal pain and blood in stool.  Endocrine: Negative for cold intolerance and heat intolerance.  Genitourinary: Positive for urgency. Negative for dysuria, scrotal swelling and penile pain.  Musculoskeletal: Positive for back pain and arthralgias.  Neurological: Negative for tremors, speech difficulty, weakness, light-headedness and headaches.  Psychiatric/Behavioral: Negative for hallucinations, behavioral problems, confusion and dysphoric mood.       Objective:   Physical Exam  Constitutional: He is oriented to person, place, and time. He appears well-developed and well-nourished.  HENT:  Right Ear: External ear normal.    Left Ear: External ear normal.  Nose: Nose normal.  Mouth/Throat: Oropharynx is clear and moist.  No swelling in area of tooth problem which is right lower  Eyes: Conjunctivae and EOM are normal. Pupils are equal, round, and reactive to light.  Neck: Normal range of motion. Neck supple. No JVD present. No tracheal deviation present. No thyromegaly present.  Cardiovascular: Normal rate, regular rhythm, normal heart sounds and intact distal pulses.  Exam reveals no friction rub.   No murmur heard. Pulmonary/Chest: Effort normal and breath sounds normal. No respiratory distress. He has no wheezes. He has no rales.  Abdominal: Soft. There is no tenderness.  Genitourinary: Penis normal.  Concerned about slight area of skin discoloration on dorsum penis, appears normal , maybe a little dry skin  Musculoskeletal: He exhibits no edema.  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  Neurological: He is alert and oriented to person, place, and time. He has normal reflexes. He displays normal reflexes. No cranial nerve deficit. He exhibits normal muscle tone. Coordination normal.  Skin: No rash noted.  Psychiatric: He has a normal mood and affect. His behavior is normal. Judgment and thought content normal.          Assessment & Plan:

## 2015-05-03 NOTE — Assessment & Plan Note (Signed)
Continue current 3 drug regimen Agree Goal systolic 140-150 Lisinopril,catapres, plendil

## 2015-05-03 NOTE — Assessment & Plan Note (Signed)
Recheck uric acid. Discussed meds, no changes

## 2015-05-03 NOTE — Assessment & Plan Note (Signed)
Not on any nitrates, no hx reduced EF or prior Mi Continue current meds

## 2015-05-03 NOTE — Assessment & Plan Note (Signed)
Recheck today Pt wishes to continue current regimen

## 2015-05-03 NOTE — Assessment & Plan Note (Signed)
Infected root canal by report--scheduled for dental surgery next week Does not need any antibiotic prophylaxis

## 2015-05-03 NOTE — Assessment & Plan Note (Signed)
Reports no problems or symptoms in many years

## 2015-05-03 NOTE — Assessment & Plan Note (Signed)
TSH is 5.0 on 04/2015 Will recheck in 3 m---lab order is in

## 2015-05-12 ENCOUNTER — Other Ambulatory Visit: Payer: Self-pay | Admitting: Family Medicine

## 2015-05-16 ENCOUNTER — Ambulatory Visit (INDEPENDENT_AMBULATORY_CARE_PROVIDER_SITE_OTHER): Payer: Medicare Other | Admitting: Family Medicine

## 2015-05-16 ENCOUNTER — Encounter: Payer: Self-pay | Admitting: Family Medicine

## 2015-05-16 VITALS — BP 130/84 | HR 56 | Ht 69.0 in | Wt 183.0 lb

## 2015-05-16 DIAGNOSIS — M9902 Segmental and somatic dysfunction of thoracic region: Secondary | ICD-10-CM | POA: Diagnosis not present

## 2015-05-16 DIAGNOSIS — M9903 Segmental and somatic dysfunction of lumbar region: Secondary | ICD-10-CM

## 2015-05-16 DIAGNOSIS — M999 Biomechanical lesion, unspecified: Secondary | ICD-10-CM

## 2015-05-16 DIAGNOSIS — M4806 Spinal stenosis, lumbar region: Secondary | ICD-10-CM | POA: Diagnosis not present

## 2015-05-16 DIAGNOSIS — M9904 Segmental and somatic dysfunction of sacral region: Secondary | ICD-10-CM

## 2015-05-16 DIAGNOSIS — M48062 Spinal stenosis, lumbar region with neurogenic claudication: Secondary | ICD-10-CM

## 2015-05-16 NOTE — Assessment & Plan Note (Signed)
Decision today to treat with OMT was based on Physical Exam  After verbal consent patient was treated with HVLA and ME techniques in thoracic, lumbar and sacral areas  Patient tolerated the procedure well with improvement in symptoms  Patient given exercises, stretches and lifestyle modifications  See medications in patient instructions if given  Patient will follow up in 4-6 weeks                         

## 2015-05-16 NOTE — Progress Notes (Signed)
  Subjective:    CC: Back pain followup  HPI: Patient is a very pleasant 81 year old recently retired male coming back for osteopathic manipulation. Patient does have a past medical history for severe spinal stenosis Patient states overall he has been doing relatively well. Having some mild increase in back pain recently. Would not consider it a full exacerbation. States that it is similar to his other problems. Possibly some increasing tightness in his hamstrings that he knows his from his back and usually one of the first time she's having more difficulty.  Patient also has had some tooth pain on the right side. Patient did have an infection and did have it removed.  Past medical history, Surgical history, Family history not pertinant except as noted below, Social history, Allergies, and medications have been entered into the medical record, reviewed, and no changes needed.   Review of Systems: No fevers, chills, night sweats, weight loss, chest pain, or shortness of breath.   Objective:   Blood pressure 130/84, pulse 56, height  (1.753 m), weight 183 lb (83.008 kg), SpO2 97 %.  General: Well Developed, well nourished, and in no acute distress.  Neuro: Alert and oriented x3, extra-ocular muscles intact, sensation grossly intact.  HEENT: Normocephalic, atraumatic, pupils equal round reactive to light, neck supple, no masses, no lymphadenopathy, thyroid nonpalpable.  Exam shows patient does have what appears to be a crack to's of the posterior molar on the inferior side. No sign of infection at this time. Tender to palpation on the cheek on the outside. Skin: Warm and dry, no rashes. Does have incisions on knees bilaterally for knee replacement surgery. Cardiac:  no lower extremity edema. Respiratory: Not using accessory muscles, speaking in full sentences. Abdominal: NT, soft Gait: Nonantlagic, good balance and coordination Lymphatic: no lymphadenopathy in neck or axillae on  palpation, non tender.  Musculoskeletal: Inspection and palpation of the right and left upper extremities including the shoulders elbows and wrist are unremarkable with full range of motion and good muscle strength and tone. Inspection and palpation of the right and left lower extremities including the hips knees and ankles are unremarkable and nontender with full range of motion and good muscle strength and tone and are symmetric.  Back Exam:  Inspection: Unremarkable  Motion: Flexion 40 deg, Extension 25 deg, Side Bending to 35 deg bilaterally,  Rotation to 35 deg continued tightness from previous exam SLR laying: Negative  XSLR laying: Negative  Palpable tenderness: more generalized discomfort of the Pierce musculature of the lumbar spine Even more so than previous exam. FABER: negative. Sensory change: Gross sensation intact to all lumbar and sacral dermatomes.  Reflexes: 2+ at both patellar tendons, 2+ at achilles tendons, Babinski's downgoing.  Strength at foot  Plantar-flexion: 5/5 Dorsi-flexion: 5/5 Eversion: 5/5 Inversion: 5/5  Leg strength  Quad: 5/5 Hamstring: 5/5 Hip flexor: 5/5 Hip abductors 5/5  Gait unremarkable.  OMT Findings:  Cervical:   C2 flexed rotated and side bent right Thoracic:  T3 extended rotated and side bent right T7 extended rotated and side bent right Lumbar:  L3 F RS right,  L4 F RS left.  Sacrum:  Right on right     Impression and Recommendations:

## 2015-05-16 NOTE — Progress Notes (Signed)
Pre visit review using our clinic review tool, if applicable. No additional management support is needed unless otherwise documented below in the visit note. 

## 2015-05-16 NOTE — Assessment & Plan Note (Signed)
Patient has this was severe spinal stenosis. Continues to improve with conservative therapy most of the time. Still playing golf multiple days a week. Is responding well to osteopathic manipulation. I do not feel any aggressive therapy is warranted. I would like to see patient back again in 4-6 weeks for further evaluation and treatment.

## 2015-05-16 NOTE — Patient Instructions (Signed)
Good to see you Ice when you need it You are doing great  You know where I am if you need me Check with urologist

## 2015-06-06 ENCOUNTER — Other Ambulatory Visit: Payer: Self-pay | Admitting: Family Medicine

## 2015-06-22 ENCOUNTER — Other Ambulatory Visit: Payer: Self-pay | Admitting: Family Medicine

## 2015-06-25 ENCOUNTER — Telehealth: Payer: Self-pay | Admitting: *Deleted

## 2015-06-25 MED ORDER — SILDENAFIL CITRATE 20 MG PO TABS: 20.0000 mg | ORAL_TABLET | Freq: Three times a day (TID) | ORAL | Status: DC

## 2015-06-25 NOTE — Telephone Encounter (Signed)
done

## 2015-06-25 NOTE — Telephone Encounter (Signed)
Refill request faxed for sildenafil .  Okay to refill?

## 2015-06-27 ENCOUNTER — Ambulatory Visit: Payer: Medicare Other | Admitting: Family Medicine

## 2015-07-05 ENCOUNTER — Other Ambulatory Visit: Payer: Self-pay | Admitting: Family Medicine

## 2015-07-05 NOTE — Telephone Encounter (Signed)
Had exam on 05/02/15.  Did not get any feedback for the labs he had drawn that day.  Would like to have a call from provider to discuss.   Need refill sildenafil 20 mg.  [facility] to [facility] on [address]

## 2015-07-06 MED ORDER — SILDENAFIL CITRATE 20 MG PO TABS: ORAL_TABLET | ORAL | Status: DC

## 2015-07-06 NOTE — Telephone Encounter (Signed)
[phone] Judie Petit(M) [phone] (W) [phone] (H)  Discussed. Re-sending letter. Dosing sildenafil for ED He has questions about coloncoscopy needs and stress test needs Denny LevySara Neal

## 2015-07-09 ENCOUNTER — Telehealth: Payer: Self-pay | Admitting: *Deleted

## 2015-07-09 NOTE — Telephone Encounter (Signed)
[facility]  PA form placed in provider box for completion. Clovis PuMartin, Browning Southwood L, RN

## 2015-07-11 NOTE — Telephone Encounter (Signed)
PA form faxed to [facility] for review.  Review process could take 24-72 hours to complete.  Aliena Ghrist L, RN  

## 2015-07-16 NOTE — Telephone Encounter (Signed)
PA denied.  MD aware and will contact patient.  Clovis PuMartin, Chasiti Waddington L, RN

## 2015-07-17 ENCOUNTER — Ambulatory Visit (INDEPENDENT_AMBULATORY_CARE_PROVIDER_SITE_OTHER): Payer: Medicare Other | Admitting: Family Medicine

## 2015-07-17 VITALS — BP 138/82 | HR 61 | Wt 181.0 lb

## 2015-07-17 DIAGNOSIS — M999 Biomechanical lesion, unspecified: Secondary | ICD-10-CM

## 2015-07-17 DIAGNOSIS — M9903 Segmental and somatic dysfunction of lumbar region: Secondary | ICD-10-CM | POA: Diagnosis not present

## 2015-07-17 DIAGNOSIS — M9904 Segmental and somatic dysfunction of sacral region: Secondary | ICD-10-CM

## 2015-07-17 DIAGNOSIS — M9902 Segmental and somatic dysfunction of thoracic region: Secondary | ICD-10-CM

## 2015-07-17 NOTE — Progress Notes (Signed)
  Subjective:    CC: Back pain followup  HPI: Patient is a very pleasant 81 year old recently retired male coming back for osteopathic manipulation. Patient does have a past medical history for severe spinal stenosis Patient has been doing very well. Continues to stay active. Working out at least 30 minutes a day. Plays golf multiple times a week. Has been traveling more frequently. States he did disconnect from [place]. Was playing golf there. Some mild straining her hands but nothing severe. Patient states mild discomfort but overall been doing well. No radiation of the legs any numbness or weakness.    Past medical history, Surgical history, Family history not pertinant except as noted below, Social history, Allergies, and medications have been entered into the medical record, reviewed, and no changes needed.   Review of Systems: No fevers, chills, night sweats, weight loss, chest pain, or shortness of breath.   Objective:   Blood pressure 138/82, pulse 61, weight 181 lb (82.101 kg), SpO2 97 %.  General: Well Developed, well nourished, and in no acute distress.  Neuro: Alert and oriented x3, extra-ocular muscles intact, sensation grossly intact.  HEENT: Normocephalic, atraumatic, pupils equal round reactive to light, neck supple, no masses, no lymphadenopathy, thyroid nonpalpable.  Exam shows patient does have what appears to be a crack to's of the posterior molar on the inferior side. No sign of infection at this time. Tender to palpation on the cheek on the outside. Skin: Warm and dry, no rashes. Does have incisions on knees bilaterally for knee replacement surgery. Cardiac:  no lower extremity edema. Respiratory: Not using accessory muscles, speaking in full sentences. Abdominal: NT, soft Gait: Nonantlagic, good balance and coordination Lymphatic: no lymphadenopathy in neck or axillae on palpation, non tender.  Musculoskeletal: Inspection and palpation of the right and left upper  extremities including the shoulders elbows and wrist are unremarkable with full range of motion and good muscle strength and tone. Inspection and palpation of the right and left lower extremities including the hips knees and ankles are unremarkable and nontender with full range of motion and good muscle strength and tone and are symmetric.  Back Exam:  Inspection: Unremarkable  Motion: Flexion 40 deg, Extension 25 deg, Side Bending to 35 deg bilaterally,  Rotation to 35 deg seems patient's baseline SLR laying: Negative  XSLR laying: Negative  Palpable tenderness: less tightness less tenderness in the paraspinal muscular than previous exam FABER: negative. Sensory change: Gross sensation intact to all lumbar and sacral dermatomes.  Reflexes: 2+ at both patellar tendons, 2+ at achilles tendons, Babinski's downgoing.  Strength at foot  Plantar-flexion: 5/5 Dorsi-flexion: 5/5 Eversion: 5/5 Inversion: 5/5  Leg strength  Quad: 5/5 Hamstring: 5/5 Hip flexor: 5/5 Hip abductors 5/5  Gait unremarkable.  OMT Findings:  Cervical:   C2 flexed rotated and side bent right Thoracic:  T3 extended rotated and side bent right T8 extended rotated and side bent right Lumbar:  L3 F RS right,  L4 F RS left.  Sacrum:  Right on right     Impression and Recommendations:

## 2015-07-17 NOTE — Assessment & Plan Note (Signed)
Decision today to treat with OMT was based on Physical Exam  After verbal consent patient was treated with HVLA and ME techniques in thoracic, lumbar and sacral areas  Patient tolerated the procedure well with improvement in symptoms  Patient given exercises, stretches and lifestyle modifications  See medications in patient instructions if given  Patient will follow up in 8 weeks

## 2015-07-17 NOTE — Assessment & Plan Note (Signed)
Been stable for quite some time. Patient does have known spinal stenosis. Continues to do well with no significant worsening symptoms. At this point I do think the patient can come back in 8 week intervals. Continue all other medications. Has not needed any pain medication for break through pain.

## 2015-07-17 NOTE — Patient Instructions (Signed)
Good to see you  You have been doing great.  I want you to continue to work on the core but I am impressed.  Enjoy the upcoming golf season.   I would say though lets keep visits every 8 weeks during the season then to just have a tune up Try toweling off the forehead after playing and lotion to see if that helps the forehead.

## 2015-07-24 ENCOUNTER — Encounter: Payer: Self-pay | Admitting: Family Medicine

## 2015-07-24 NOTE — Progress Notes (Signed)
Received notice from his insurance that [facility](Medicare) will not pay for drugs treatment of sexual dysfunction. Reference prior Auth # [id number]

## 2015-08-15 ENCOUNTER — Ambulatory Visit (INDEPENDENT_AMBULATORY_CARE_PROVIDER_SITE_OTHER): Payer: Medicare Other | Admitting: Family Medicine

## 2015-08-15 ENCOUNTER — Encounter: Payer: Self-pay | Admitting: Family Medicine

## 2015-08-15 VITALS — BP 162/79 | Ht 68.0 in | Wt 180.0 lb

## 2015-08-15 DIAGNOSIS — S76311A Strain of muscle, fascia and tendon of the posterior muscle group at thigh level, right thigh, initial encounter: Secondary | ICD-10-CM

## 2015-08-15 MED ORDER — TRAMADOL HCL 50 MG PO TABS: 50.0000 mg | ORAL_TABLET | Freq: Two times a day (BID) | ORAL | Status: DC | PRN

## 2015-08-20 ENCOUNTER — Telehealth: Payer: Self-pay | Admitting: *Deleted

## 2015-08-20 NOTE — Telephone Encounter (Signed)
Patient calling requesting to speak with Dr. Jennette KettleNeal, asked patient several times what this was regarding but patient only wanted to speak directly with Dr. Jennette KettleNeal.

## 2015-08-21 ENCOUNTER — Encounter: Payer: Self-pay | Admitting: Family Medicine

## 2015-08-21 ENCOUNTER — Other Ambulatory Visit: Payer: Medicare Other

## 2015-08-21 DIAGNOSIS — E039 Hypothyroidism, unspecified: Secondary | ICD-10-CM

## 2015-08-21 NOTE — Telephone Encounter (Signed)
Mr Willette BraceBoger called back to remind us of scheduling his thyroid lab, I scheduled that for today.  Also he stated that he had gone down to see Dr. Jettie BoozeField and Dr. Mayford KnifeWilliams about the problem with his quadricep.  He stated that they focused on the injury and that they didn't have his background about his spinal stenosis.  He wanted to see if Dr. Jennette KettleNeal could collaborate with them about this and see if there is an aggressive approach for the pain he is having. Pt stated that the pain shoots down deep inside and feels like his bones are aching. He did mention maybe a blood clot.  Told him i would send a message to PCP to be advised as to his next step. He stated that he would be happy to speak with Dr. Jennette KettleNeal this afternoon when he comes in for his lab appointment if she needs to speak with him Lamonte SakaiZimmerman Rumple, April D, CMA

## 2015-08-21 NOTE — Telephone Encounter (Signed)
Dear Cliffton AstersWhite Team It sounds more complicated than a quick conversation. You can put him at very end of my clinic tomorrow--tell him NOON and that he is an add in so I may be running late. (Yes, I know I am already way booked up) [facility]! Denny LevySara Eustacio Ellen

## 2015-08-21 NOTE — Progress Notes (Signed)
Paul [name] - 81 y.o. 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 The pain originates in the medial groin and is sharp. No definite injury although he does work out quite a bit and works on flexibility regularly. He did use the elliptical machine which felt irregular for him. He denies any lower extremity swelling. No personal or family history of a blood clot He is otherwise feeling well. He golfs almost daily [place]. He denies any new onset lower extremity weakness. No pain with ambulation. He mostly has pain getting out of a chair and up from bed. He has no changes in bowel or bladder dysfunction. He has a history of spinal stenosis which last bothered him in 2011. This does not seem similar to that issue.  ROS:     As above, no fevers chills or night sweats.   HISTORY: Past Medical, Surgical, Social, and Family History Reviewed & Updated per EMR.  Pertinent Historical Findings include: HTN, Lumbar radiculopathy, ckd stage III, gout, history of L4-5 spinal stenosis based on 2011 MRI with subsequent resolution of pain.  OBJECTIVE: BP 162/79 mmHg  Ht 5\' 8"  (1.727 m)  Wt 180 lb (81.647 kg)  BMI 27.38 kg/m2  Physical Exam  Calm, NAD Non-labored breathing  Tenderness to palpation over the medial inguinal ligament & pubic tubercle on the right side. Tenderness of the adductor origin on the tubercle. Lesser tenderness throughout the adductor musculature on the right  Hip: right ROM IR: 80 Deg, ER: 80 Deg, Flexion: 120 Deg, Extension: 100 Deg, Abduction: 45 Deg, Adduction: 45 Deg Strength IR: 5/5, ER: 5/5, Flexion: 5/5, Extension: 5/5, Abduction: 5/5, Adduction: 5/5 Greater trochanter without tenderness to palpation. No  tenderness over piriformis and greater trochanter. No SI joint tenderness  Full range of motion of the right knee, 5 out of 5 strength with flexion and extension  Reflex change: none Strength at foot Plantar-flexion: 5 / 5    Dorsi-flexion: 5 / 5     Gait Walking:  Nonantalgic        Ultrasound: Long and short axis views of the right medial groin were obtained. Tenderness with probe placement adductor origin on the pubic tubercle. No consistent irregularities were found on ultrasound.  MEDICATIONS, LABS & OTHER ORDERS: Previous Medications   ALLOPURINOL (ZYLOPRIM) 100 MG TABLET    take 1 tablet by mouth once daily   ASPIRIN 81 MG CHEWABLE TABLET    Chew 81 mg by mouth daily.     CLONIDINE (CATAPRES - DOSED IN MG/24 HR) 0.3 MG/24HR PATCH    apply 1 patch every week   COLCHICINE 0.6 MG TABLET    Take 1 tablet (0.6 mg total) by mouth 2 (two) times daily as needed.   FELODIPINE (PLENDIL) 10 MG 24 HR TABLET    take 1 tablet by mouth once daily   FLUTICASONE (FLONASE) 50 MCG/ACT NASAL SPRAY    Place 2 sprays into both nostrils daily.   LISINOPRIL (PRINIVIL,ZESTRIL) 40 MG TABLET    take 1 tablet by mouth once daily   POLYETHYLENE GLYCOL POWDER (GLYCOLAX/MIRALAX) POWDER    take 17GM (DISSOLVED IN WATER) by mouth twice a day   PRAVASTATIN (PRAVACHOL) 40 MG TABLET  take 1 tablet by mouth once daily   SILDENAFIL (REVATIO) 20 MG TABLET    Takeone by mouth as directed for erectile dysfunction   Modified Medications   Modified Medication Previous Medication   TRAMADOL (ULTRAM) 50 MG TABLET traMADol (ULTRAM) 50 MG tablet      Take 1 tablet (50 mg total) by mouth every 12 (twelve) hours as needed.    Take 1 tablet (50 mg total) by mouth every 12 (twelve) hours as needed. Take one tablet by mouth every 4-6 hours as needed for pain   New Prescriptions   No medications on file   Discontinued Medications   No medications on file  No orders of the defined types were placed in this encounter.     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  He has tenderness over the medial adductors, most prominent at their origin on the pubic tubercle. He also has pain throughout the adductor group of muscles. He believes he may have injured it using the elliptical although he has not had any pain with activity since. The pain is exacerbated with various movements, although not one consistently. He does not have any warning signs or symptoms of a DVT.  He was counseled to seek medical attention if he does develop swelling or dyspnea. He has a history of spinal stenosis but no issues in the last few years.  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 With his medical history an MRI of the lumbar spine would probably be the first step in evaluating this pain if it does not seem to be a musculoskeletal issue. It would be too early to get an EMG. He is leaving to go golfing later today and there is no reason he cannot pursue this. We will provide him with tramadol to take as needed and he was reminded of the sedation side effects. We have also recommended thigh compression sleeve and for him to ease back on his exercise regimen which includes adductor reps. Please call with any questions or concerns. If he is not starting to feel better later this week. We will see him back in clinic next week.

## 2015-08-21 NOTE — Telephone Encounter (Signed)
Contacted pt and he will be here tomorrow to speak with Dr. Jennette KettleNeal. Joycelyn ManZimmerman Rumple, April D, CMA

## 2015-08-22 ENCOUNTER — Other Ambulatory Visit: Payer: Self-pay | Admitting: Family Medicine

## 2015-08-22 ENCOUNTER — Ambulatory Visit (INDEPENDENT_AMBULATORY_CARE_PROVIDER_SITE_OTHER): Payer: Medicare Other | Admitting: Family Medicine

## 2015-08-22 ENCOUNTER — Encounter: Payer: Self-pay | Admitting: Family Medicine

## 2015-08-22 VITALS — BP 127/67 | HR 59 | Temp 98.3°F | Ht 69.0 in | Wt 179.1 lb

## 2015-08-22 DIAGNOSIS — M48061 Spinal stenosis, lumbar region without neurogenic claudication: Secondary | ICD-10-CM

## 2015-08-22 DIAGNOSIS — M5417 Radiculopathy, lumbosacral region: Secondary | ICD-10-CM | POA: Diagnosis not present

## 2015-08-22 DIAGNOSIS — M4806 Spinal stenosis, lumbar region: Secondary | ICD-10-CM

## 2015-08-22 DIAGNOSIS — M5416 Radiculopathy, lumbar region: Secondary | ICD-10-CM | POA: Diagnosis not present

## 2015-08-22 LAB — T4, FREE: Free T4: 1 ng/dL (ref 0.8–1.8)

## 2015-08-22 LAB — TSH: TSH: 5.66 mIU/L — ABNORMAL HIGH (ref 0.40–4.50)

## 2015-08-24 DIAGNOSIS — M5416 Radiculopathy, lumbar region: Secondary | ICD-10-CM

## 2015-08-24 DIAGNOSIS — M48061 Spinal stenosis, lumbar region without neurogenic claudication: Secondary | ICD-10-CM | POA: Insufficient documentation

## 2015-08-24 NOTE — Progress Notes (Signed)
   Subjective:    Patient ID: Paul RoanDavid Parekh, male    DOB: 04-16-1935, 81 y.o.   MRN: [id number]  HPI Right thigh pain. Started after he used a recumbent glider at the gym. He usually does not do this particular exercise. It feels similar to some symptoms he had previously in the past from a herniated disc. The pain is anterior thigh down the medial portion particularly to the area of the knee. Has not had leg weakness. No bowel or bladder incontinence. Pain is 4-5 out of 10. Present most of the time.   Review of Systems See history of present illness    Objective:   Physical Exam I'll signs are reviewed GEN.: Well-developed male no acute distress HIPS: Internal/external rotation is full and painless. THIGH: Muscle bulk and tone is symmetrical and normal. He has intact strength in flexion extension is symmetrical. There is very mild tenderness to palpation over the abductor origin but this does not reproduce his pain. NEURO: Straight leg raise negative. Intact sensation to soft touch and sharp touch bilateral lower extremities all the way down to the feet. DTRs 2+ bilaterally symmetrical knee. IMAGING: Reviewed his MRI images from 2011. He has pre-significant spinal stenosis L3-4-5 particularly on the right with the L4-5 and L3-4 foramina. There appears to be a disc fragment on the right L5 foraminal area that could compressed L5 nerve root.       Assessment & Plan:

## 2015-08-24 NOTE — Assessment & Plan Note (Signed)
I suspect he has 2 issues going on. Certainly he may have some mild abductor muscle strain from the new exercise equipment but I suspect he also has irritated his lumbar spinal area and aggravated L3-4 nerve roots. In the past he is had good success with epidural steroid injections. We spent greater than 50% of our 25 minute office visit in counseling and education regarding options and etiology of pain. Ultimately he agreed to a repeat injection epidural steroid we will set that up. His symptoms worsen, he will let me know.

## 2015-08-28 ENCOUNTER — Ambulatory Visit: Payer: Medicare Other | Admitting: Family Medicine

## 2015-08-29 ENCOUNTER — Other Ambulatory Visit: Payer: Self-pay | Admitting: Family Medicine

## 2015-08-29 ENCOUNTER — Ambulatory Visit
Admission: RE | Admit: 2015-08-29 | Discharge: 2015-08-29 | Disposition: A | Discharge status: Home / Self Care | Payer: Medicare Other | Source: Ambulatory Visit | Attending: Family Medicine | Admitting: Family Medicine

## 2015-08-29 DIAGNOSIS — M5417 Radiculopathy, lumbosacral region: Secondary | ICD-10-CM

## 2015-08-29 MED ORDER — IOPAMIDOL (ISOVUE-M 200) INJECTION 41%: 1.0000 mL | Freq: Once | INTRAMUSCULAR | Status: DC

## 2015-08-29 MED ORDER — METHYLPREDNISOLONE ACETATE 40 MG/ML INJ SUSP (RADIOLOG: 120.0000 mg | Freq: Once | INTRAMUSCULAR | Status: DC

## 2015-08-29 NOTE — Discharge Instructions (Signed)

## 2015-09-11 ENCOUNTER — Ambulatory Visit (INDEPENDENT_AMBULATORY_CARE_PROVIDER_SITE_OTHER): Payer: Medicare Other | Admitting: Family Medicine

## 2015-09-11 ENCOUNTER — Encounter: Payer: Self-pay | Admitting: Family Medicine

## 2015-09-11 VITALS — BP 132/80 | HR 65 | Ht 69.0 in | Wt 176.0 lb

## 2015-09-11 DIAGNOSIS — M9903 Segmental and somatic dysfunction of lumbar region: Secondary | ICD-10-CM | POA: Diagnosis not present

## 2015-09-11 DIAGNOSIS — M5416 Radiculopathy, lumbar region: Secondary | ICD-10-CM

## 2015-09-11 DIAGNOSIS — M9902 Segmental and somatic dysfunction of thoracic region: Secondary | ICD-10-CM

## 2015-09-11 DIAGNOSIS — M9904 Segmental and somatic dysfunction of sacral region: Secondary | ICD-10-CM | POA: Diagnosis not present

## 2015-09-11 DIAGNOSIS — M999 Biomechanical lesion, unspecified: Secondary | ICD-10-CM

## 2015-09-11 DIAGNOSIS — M48061 Spinal stenosis, lumbar region without neurogenic claudication: Secondary | ICD-10-CM

## 2015-09-11 DIAGNOSIS — M4806 Spinal stenosis, lumbar region: Secondary | ICD-10-CM | POA: Diagnosis not present

## 2015-09-11 MED ORDER — GABAPENTIN 100 MG PO CAPS: 100.0000 mg | ORAL_CAPSULE | Freq: Every day | ORAL | Status: DC

## 2015-09-11 NOTE — Patient Instructions (Signed)
Good to see you  I do think this is coming from your back .  Follow exercise with ice for about 20 minutes.  OK to do heat before activity  Lets add 100mg  of gabapentin at night to help a little with the nerve irritation  Did some manipulation today and will be a little more aggressive when I see you again in 2 weeks and then we will get you back on your routine.

## 2015-09-11 NOTE — Progress Notes (Signed)
Subjective:    CC: Back pain followup  HPI: Patient is a very pleasant 81 year old recently retired male coming back for osteopathic manipulation. Patient does have a past medical history for severe spinal stenosis Patient had been doing very well but unfortunately was having a signs of possible radicular symptoms. Patient was seen by his primary care provider. Patient did have an epidural may 3rd.  Patient states that it is improved most of the discomfort that hand. Continues on having a dull throbbing aching pain on the medial aspect of his leg. Since then it does seem to be somewhat associated to his back. Also still having some weakness of the lower extremities from what he would consider his baseline. Has been able to play golf but states that not plain as well as he usually does. Denies any file or bladder incontinence. Denies any muscle spasms recently. Still does not feel that he is back to his baseline yet. Denies fevers chills or any abnormal weight loss.   Past Medical History  Diagnosis Date  . Cataract    Past Surgical History  Procedure Laterality Date  . Total knee arthroplasty      left  . Transurethral resection of prostate  2011   Social History  Substance Use Topics  . Smoking status: Former Smoker    Quit date: 04/28/1972  . Smokeless tobacco: Never Used  . Alcohol Use: 3.5 oz/week    7 drink(s) per week     Comment: 1 glass of wine a night   No Known Allergies Family History  Problem Relation Age of Onset  . Diabetes Mother   . Kidney disease Father   . Cancer Sister   . Heart disease Brother   . Cancer Sister     Past medical history, Surgical history, Family history not pertinant except as noted below, Social history, Allergies, and medications have been entered into the medical record, reviewed, and no changes needed.   Review of Systems: No fevers, chills, night sweats, weight loss, chest pain, or shortness of breath. Denies any  change in bowel  habits, weakness of the lower Jimmy's.  Objective:   Blood pressure 132/80, pulse 65, height  (1.753 m), weight 176 lb (79.833 kg), SpO2 98 %.  General: Well Developed, well nourished, and in no acute distress.  Neuro: Alert and oriented x3, extra-ocular muscles intact, sensation grossly intact.  HEENT: Normocephalic, atraumatic, pupils equal round reactive to light, neck supple, no masses, no lymphadenopathy, thyroid nonpalpable.  Exam shows patient does have what appears to be a crack to's of the posterior molar on the inferior side. No sign of infection at this time. Tender to palpation on the cheek on the outside. Skin: Warm and dry, no rashes. Does have incisions on knees bilaterally for knee replacement surgery. Cardiac:  no lower extremity edema. Respiratory: Not using accessory muscles, speaking in full sentences. Abdominal: NT, soft Gait: Nonantlagic, good balance and coordination Lymphatic: no lymphadenopathy in neck or axillae on palpation, non tender.  Musculoskeletal: Inspection and palpation of the right and left upper extremities including the shoulders elbows and wrist are unremarkable with full range of motion and good muscle strength and tone. Inspection and palpation of the right and left lower extremities including the hips knees and ankles are unremarkable and nontender with full range of motion and good muscle strength and tone and are symmetric.  Back Exam:  Inspection: Unremarkable  Motion: Flexion 40 deg, Extension 15 deg, Side Bending to 25 deg bilaterally,  Rotation to 35 deg seems patient's baseline SLR laying: positive right with severe tightness of the hamstring more with radicular symptoms going down the medial aspect of the leg XSLR laying: Negative  Increasing tightness of the thoracolumbar juncture as well as the paraspinal musket sure of the lumbar spine from previous exam FABER: more stiffness today than previously bilaterally right greater than  left Sensory change: Gross sensation intact to all lumbar and sacral dermatomes.  Reflexes: 2+ at both patellar tendons, 2+ at achilles tendons, Babinski's downgoing.  Strength at foot  Leg strength shows 4+ out of 5 strength but symmetric. Minor weakness from previous exam Tendon reflexes are intact. OMT Findings:  Cervical:   C2 flexed rotated and side bent right Thoracic:  T3 extended rotated and side bent right Lumbar:  L2 F RS right,  Sacrum:  Right on right     Impression and Recommendations:

## 2015-09-11 NOTE — Assessment & Plan Note (Signed)
Decision today to treat with OMT was based on Physical Exam  After verbal consent patient was treated with HVLA and ME techniques in thoracic, lumbar and sacral areas  Patient tolerated the procedure well with improvement in symptoms  Patient given exercises, stretches and lifestyle modifications  See medications in patient instructions if given  Patient will follow up in 2-3 weeks

## 2015-09-11 NOTE — Assessment & Plan Note (Signed)
Patient is having worsening symptoms at this time. Feels on a more of a bilateral weakness of the leg as well as radicular symptoms down the right leg. Patient did respond somewhat to his recent epidural. Patient will be started on a low dose gabapentin. Patient does respond fairly well to osteopathic manipulation. Follow-up again in 2 weeks. If worsening symptoms with patient having radicular symptoms more on the inside of the leg or possible epidural at the L2-L3 or even L3-L4 levels may be a possibility. We will discuss at follow-up.

## 2015-09-11 NOTE — Progress Notes (Signed)
Pre visit review using our clinic review tool, if applicable. No additional management support is needed unless otherwise documented below in the visit note. 

## 2015-09-13 ENCOUNTER — Other Ambulatory Visit: Payer: Self-pay | Admitting: Family Medicine

## 2015-09-27 ENCOUNTER — Ambulatory Visit: Payer: Medicare Other | Admitting: Family Medicine

## 2015-10-01 ENCOUNTER — Ambulatory Visit: Payer: Medicare Other | Admitting: Family Medicine

## 2015-10-04 ENCOUNTER — Ambulatory Visit (INDEPENDENT_AMBULATORY_CARE_PROVIDER_SITE_OTHER): Payer: Medicare Other | Admitting: Family Medicine

## 2015-10-04 ENCOUNTER — Encounter: Payer: Self-pay | Admitting: Family Medicine

## 2015-10-04 VITALS — BP 138/84 | HR 58 | Ht 69.0 in | Wt 178.0 lb

## 2015-10-04 DIAGNOSIS — M9904 Segmental and somatic dysfunction of sacral region: Secondary | ICD-10-CM | POA: Diagnosis not present

## 2015-10-04 DIAGNOSIS — M9903 Segmental and somatic dysfunction of lumbar region: Secondary | ICD-10-CM

## 2015-10-04 DIAGNOSIS — M48061 Spinal stenosis, lumbar region without neurogenic claudication: Secondary | ICD-10-CM

## 2015-10-04 DIAGNOSIS — M5416 Radiculopathy, lumbar region: Secondary | ICD-10-CM

## 2015-10-04 DIAGNOSIS — M9902 Segmental and somatic dysfunction of thoracic region: Secondary | ICD-10-CM | POA: Diagnosis not present

## 2015-10-04 DIAGNOSIS — M4806 Spinal stenosis, lumbar region: Secondary | ICD-10-CM | POA: Diagnosis not present

## 2015-10-04 DIAGNOSIS — M999 Biomechanical lesion, unspecified: Secondary | ICD-10-CM

## 2015-10-04 NOTE — Progress Notes (Signed)
Pre visit review using our clinic review tool, if applicable. No additional management support is needed unless otherwise documented below in the visit note. 

## 2015-10-04 NOTE — Assessment & Plan Note (Signed)
I do believe the patient is having more lumbar radiculopathy. I do think it is secondary to his spinal stenosis. Seems to be improving somewhat but continues to have some difficulty. Did respond fairly well to an L5 nerve root injection but I do feel an L4-L5 epidurals could be beneficial for the spinal stenosis. Marga HootsOakley this will help with the rest of his pain and get him back to his baseline. This will be ordered today. Continues to respond fairly well to osteopathic manipulation. We discussed icing regimen and continuing medications. Patient does have terminal for breakthrough pain that he has not used. Continues to gabapentin at 100 mg at night. Follow-up again in 3-4 weeks for further evaluation and treatment.

## 2015-10-04 NOTE — Assessment & Plan Note (Signed)
Decision today to treat with OMT was based on Physical Exam  After verbal consent patient was treated with HVLA and ME techniques in thoracic, lumbar and sacral areas  Patient tolerated the procedure well with improvement in symptoms  Patient given exercises, stretches and lifestyle modifications  See medications in patient instructions if given  Patient will follow up in 3-4 weeks

## 2015-10-04 NOTE — Progress Notes (Signed)
Subjective:    CC: Back pain followup  HPI: Patient is a very pleasant 81 year old recently retired male coming back for osteopathic manipulation. Patient does have a past medical history for severe spinal stenosis Patient had been doing very well but unfortunately was having a signs of possible radicular symptoms. Patient was seen by his primary care provider. Patient did have an epidural may 3rd.  Still having some discomfort more on the right sign. Minimal radiation that seems to be more in the groin area. Not severe. Still play golf approximately 3 times a week. More tenderness than what he would consider his baseline but states that the radicular symptoms seem to be somewhat better. Denies any weakness of the lower extremities but states that sometimes it feels that the inside of his right leg feels different but he can't describe it.   Past Medical History  Diagnosis Date  . Cataract    Past Surgical History  Procedure Laterality Date  . Total knee arthroplasty      left  . Transurethral resection of prostate  2011   Social History  Substance Use Topics  . Smoking status: Former Smoker    Quit date: 04/28/1972  . Smokeless tobacco: Never Used  . Alcohol Use: 3.5 oz/week    7 drink(s) per week     Comment: 1 glass of wine a night   No Known Allergies Family History  Problem Relation Age of Onset  . Diabetes Mother   . Kidney disease Father   . Cancer Sister   . Heart disease Brother   . Cancer Sister     Past medical history, Surgical history, Family history not pertinant except as noted below, Social history, Allergies, and medications have been entered into the medical record, reviewed, and no changes needed.   Review of Systems: No fevers, chills, night sweats, weight loss, chest pain, or shortness of breath. Denies any  change in bowel habits, weakness of the lower Jimmy's.  Objective:   Blood pressure 138/84, pulse 58, height  (1.753 m), weight 178 lb  (80.74 kg), SpO2 98 %.  General: Well Developed, well nourished, and in no acute distress.  Neuro: Alert and oriented x3, extra-ocular muscles intact, sensation grossly intact.  HEENT: Normocephalic, atraumatic, pupils equal round reactive to light, neck supple, no masses, no lymphadenopathy, thyroid nonpalpable.  Exam shows patient does have what appears to be a crack to's of the posterior molar on the inferior side. No sign of infection at this time. Tender to palpation on the cheek on the outside. Skin: Warm and dry, no rashes. Does have incisions on knees bilaterally for knee replacement surgery. Cardiac:  no lower extremity edema. Respiratory: Not using accessory muscles, speaking in full sentences. Abdominal: NT, soft Gait: Nonantlagic, good balance and coordination Lymphatic: no lymphadenopathy in neck or axillae on palpation, non tender.  Musculoskeletal: Inspection and palpation of the right and left upper extremities including the shoulders elbows and wrist are unremarkable with full range of motion and good muscle strength and tone. Inspection and palpation of the right and left lower extremities including the hips knees and ankles are unremarkable and nontender with full range of motion and good muscle strength and tone and are symmetric.  Back Exam:  Inspection: Unremarkable  Motion: Flexion 40 deg, Extension 15 deg, Side Bending to 25 deg bilaterally,  Rotation to 35 deg seems patient's baseline SLR laying: Still moderately tight hamstrings on the right side with positive straight leg test XSLR laying: Negative  Increasing mild improvement in range of motion FABER: more stiffness today than previously bilaterally right greater than left Sensory change: Gross sensation intact to all lumbar and sacral dermatomes.  Reflexes: 2+ at both patellar tendons, 2+ at achilles tendons, Babinski's downgoing.  Strength at foot  Continue mild weakness of the lower extremity from  this. Tendon reflexes are intact. OMT Findings:  Cervical:   C2 flexed rotated and side bent right Thoracic:  T3 extended rotated and side bent right T7 extended rotated and side bent right Lumbar:  L2 F RS right,  L4 flexed rotated and side bent right Sacrum:  Right on right     Impression and Recommendations:

## 2015-10-04 NOTE — Patient Instructions (Signed)
Good to see you  Overall it does appear you are improving' We will put in for a epidural at the level higher that may help a little more.   Keep doing what you are doing and the stretching is great with continuing the core strength See me again in 3 weeks or a couple weeks after the epidural

## 2015-10-08 ENCOUNTER — Other Ambulatory Visit: Payer: Self-pay | Admitting: *Deleted

## 2015-10-08 MED ORDER — PRAVASTATIN SODIUM 40 MG PO TABS: 40.0000 mg | ORAL_TABLET | Freq: Every day | ORAL | Status: DC

## 2015-10-10 ENCOUNTER — Ambulatory Visit
Admission: RE | Admit: 2015-10-10 | Discharge: 2015-10-10 | Disposition: A | Discharge status: Home / Self Care | Payer: Medicare Other | Source: Ambulatory Visit | Attending: Family Medicine | Admitting: Family Medicine

## 2015-10-10 DIAGNOSIS — M5416 Radiculopathy, lumbar region: Principal | ICD-10-CM

## 2015-10-10 DIAGNOSIS — M48061 Spinal stenosis, lumbar region without neurogenic claudication: Secondary | ICD-10-CM

## 2015-10-10 MED ORDER — METHYLPREDNISOLONE ACETATE 40 MG/ML INJ SUSP (RADIOLOG
120.0000 mg | Freq: Once | INTRAMUSCULAR | Status: AC
  Administered 2015-10-10: 120 mg via EPIDURAL

## 2015-10-10 MED ORDER — IOPAMIDOL (ISOVUE-M 200) INJECTION 41%
1.0000 mL | Freq: Once | INTRAMUSCULAR | Status: AC
  Administered 2015-10-10: 1 mL via EPIDURAL

## 2015-10-10 NOTE — Discharge Instructions (Signed)

## 2015-10-25 ENCOUNTER — Ambulatory Visit (INDEPENDENT_AMBULATORY_CARE_PROVIDER_SITE_OTHER): Payer: Medicare Other | Admitting: Family Medicine

## 2015-10-25 ENCOUNTER — Encounter: Payer: Self-pay | Admitting: Family Medicine

## 2015-10-25 VITALS — BP 134/80 | HR 60 | Ht 69.0 in | Wt 181.0 lb

## 2015-10-25 DIAGNOSIS — M9902 Segmental and somatic dysfunction of thoracic region: Secondary | ICD-10-CM

## 2015-10-25 DIAGNOSIS — M4806 Spinal stenosis, lumbar region: Secondary | ICD-10-CM | POA: Diagnosis not present

## 2015-10-25 DIAGNOSIS — M9903 Segmental and somatic dysfunction of lumbar region: Secondary | ICD-10-CM

## 2015-10-25 DIAGNOSIS — M9904 Segmental and somatic dysfunction of sacral region: Secondary | ICD-10-CM

## 2015-10-25 DIAGNOSIS — M5416 Radiculopathy, lumbar region: Secondary | ICD-10-CM

## 2015-10-25 DIAGNOSIS — M48061 Spinal stenosis, lumbar region without neurogenic claudication: Secondary | ICD-10-CM

## 2015-10-25 DIAGNOSIS — M999 Biomechanical lesion, unspecified: Secondary | ICD-10-CM

## 2015-10-25 NOTE — Assessment & Plan Note (Signed)
Decision today to treat with OMT was based on Physical Exam  After verbal consent patient was treated with HVLA and ME techniques in thoracic, lumbar and sacral areas  Patient tolerated the procedure well with improvement in symptoms  Patient given exercises, stretches and lifestyle modifications  See medications in patient instructions if given  Patient will follow up in 6 weeks

## 2015-10-25 NOTE — Progress Notes (Signed)
Pre visit review using our clinic review tool, if applicable. No additional management support is needed unless otherwise documented below in the visit note. 

## 2015-10-25 NOTE — Assessment & Plan Note (Signed)
Overall patient has been making some progress. Patient has a history of a herniated disc fragment on the right sign. Patient has worsening symptoms advance imaging could be warranted. Patient notices. Patient also has a history of gouty arthritis but continues to take his medications regularly. We discussed the possibility of formal physical therapy the patient does have core strength and has been doing home exercises religiously for multiple years. Has been responding well to osteopathic manipulation and we will continue to do some. Patient will come back and see me again in 6 weeks for further evaluation and treatment.

## 2015-10-25 NOTE — Patient Instructions (Addendum)
Good to see you  You are making progress.  Lets keep you in your routine now.  If the pain worsens I would consider getting a new MRI and we would need a new xray as well.  It has been 6 years.  Ice when you need it B12 1000mcg daily with 200mg  of vitamin B6 may help See me again in 5-6 weeks.

## 2015-10-25 NOTE — Progress Notes (Signed)
Subjective:    CC: Back pain followup  HPI: Patient is a very pleasant 81 year old recently retired male coming back for osteopathic manipulation. Patient does have a past medical history for severe spinal stenosis Patient is having worsening symptoms. Recently had second epidural patient year on June 14. Patient states he is making some progress. Patient states that the pain as well as the numbness in his feet was a most completely resolved for 24 hours and then started to come back again. Patient states that he is still doing better than what he had prior to the second injection but continues to have tightness in the morning. Continues to have numbness on the lateral aspect of the feet bilaterally. Still playing golf most days a week and continues to do daily activities and work out religiously.  Past Medical History  Diagnosis Date  . Cataract    Past Surgical History  Procedure Laterality Date  . Total knee arthroplasty      left  . Transurethral resection of prostate  2011   Social History  Substance Use Topics  . Smoking status: Former Smoker    Quit date: 04/28/1972  . Smokeless tobacco: Never Used  . Alcohol Use: 3.5 oz/week    7 drink(s) per week     Comment: 1 glass of wine a night   No Known Allergies Family History  Problem Relation Age of Onset  . Diabetes Mother   . Kidney disease Father   . Cancer Sister   . Heart disease Brother   . Cancer Sister     Past medical history, Surgical history, Family history not pertinant except as noted below, Social history, Allergies, and medications have been entered into the medical record, reviewed, and no changes needed.   Review of Systems: No fevers, chills, night sweats, weight loss, chest pain, or shortness of breath. Denies any  change in bowel habits, weakness of the lower Jimmy's.  Objective:   There were no vitals taken for this visit.  General: Well Developed, well nourished, and in no acute distress.  Neuro:  Alert and oriented x3, extra-ocular muscles intact, sensation grossly intact.  HEENT: Normocephalic, atraumatic, pupils equal round reactive to light, neck supple, no masses, no lymphadenopathy, thyroid nonpalpable.  Exam shows patient does have what appears to be a crack to's of the posterior molar on the inferior side. No sign of infection at this time. Tender to palpation on the cheek on the outside. Skin: Warm and dry, no rashes. Does have incisions on knees bilaterally for knee replacement surgery. Cardiac:  no lower extremity edema. Respiratory: Not using accessory muscles, speaking in full sentences. Abdominal: NT, soft Gait: Nonantlagic, good balance and coordination Lymphatic: no lymphadenopathy in neck or axillae on palpation, non tender.  Musculoskeletal: Inspection and palpation of the right and left upper extremities including the shoulders elbows and wrist are unremarkable with full range of motion and good muscle strength and tone. Inspection and palpation of the right and left lower extremities including the hips knees and ankles are unremarkable and nontender with full range of motion and good muscle strength and tone and are symmetric.  Back Exam:  Inspection: Unremarkable  Motion: Flexion 40 deg, Extension 15 deg, Side Bending to 25 deg bilaterally,  Rotation to 35 deg seems patient's baseline SLR laying tightness of the hamstrings bilaterally but negative straight leg test XSLR laying: Negative  Increasing mild improvement in range of motion FABER: more stiffness today than previously bilaterally right greater than left Sensory  change: Gross sensation intact to all lumbar and sacral dermatomes.  Reflexes: 2+ at both patellar tendons, 2+ at achilles tendons, Babinski's downgoing.  Strength at foot  Continue mild weakness of the lower extremity from this. Tendon reflexes are intact. OMT Findings:  Cervical:   C2 flexed rotated and side bent right Thoracic: T5 extended  rotated and side bent right T8 extended rotated and side bent right Lumbar:  L2 F RS right,  L4 flexed rotated and side bent right Sacrum:  Right on right     Impression and Recommendations:

## 2015-11-26 ENCOUNTER — Other Ambulatory Visit: Payer: Self-pay | Admitting: *Deleted

## 2015-11-26 DIAGNOSIS — I10 Essential (primary) hypertension: Secondary | ICD-10-CM

## 2015-12-03 NOTE — Progress Notes (Signed)
Subjective:    CC: Back pain followup  HPI: Patient is a very pleasant  male coming back for osteopathic manipulation.  Patient had an epidural in June 2017. Has been 2 months. Continues to have tightness. New-onset of radicular symptoms going down his legs more. Continues to try to play golf and be active. States more discomfort at night as well.  Past Medical History:  Diagnosis Date  . Cataract    Past Surgical History:  Procedure Laterality Date  . TOTAL KNEE ARTHROPLASTY     left  . TRANSURETHRAL RESECTION OF PROSTATE  2011   Social History  Substance Use Topics  . Smoking status: Former Smoker    Quit date: 04/28/1972  . Smokeless tobacco: Never Used  . Alcohol use 3.5 oz/week    7 drink(s) per week     Comment: 1 glass of wine a night   No Known Allergies Family History  Problem Relation Age of Onset  . Diabetes Mother   . Kidney disease Father   . Cancer Sister   . Heart disease Brother   . Cancer Sister     Past medical history, Surgical history, Family history not pertinant except as noted below, Social history, Allergies, and medications have been entered into the medical record, reviewed, and no changes needed.   Review of Systems: No fevers, chills, night sweats, weight loss, chest pain, or shortness of breath. Denies any  change in bowel habits, weakness of the lower Jimmy's.  Objective:   Blood pressure (!) 150/78, pulse 63, weight 178 lb (80.7 kg), SpO2 99 %.  General: Well Developed, well nourished, and in no acute distress.  Neuro: Alert and oriented x3, extra-ocular muscles intact, sensation grossly intact.  HEENT: Normocephalic, atraumatic, pupils equal round reactive to light, neck supple, no masses, no lymphadenopathy, thyroid nonpalpable.  Exam shows patient does have what appears to be a crack to's of the posterior molar on the inferior side. No sign of infection at this time. Tender to palpation on the cheek on the outside. Skin: Warm and dry,  no rashes. Does have incisions on knees bilaterally for knee replacement surgery. Cardiac:  no lower extremity edema. Respiratory: Not using accessory muscles, speaking in full sentences. Abdominal: NT, soft Gait: Nonantlagic, good balance and coordination Lymphatic: no lymphadenopathy in neck or axillae on palpation, non tender.  Musculoskeletal: Inspection and palpation of the right and left upper extremities including the shoulders elbows and wrist are unremarkable with full range of motion and good muscle strength and tone. Inspection and palpation of the right and left lower extremities including the hips knees and ankles are unremarkable and nontender with full range of motion and good muscle strength and tone and are symmetric.  Back Exam:  Inspection: Unremarkable  Motion: Flexion 40 deg, Extension 10 deg, Side Bending to 25 deg bilaterally,  Rotation to 25. More tight than previous exam SLR laying tightness of the hamstrings bilaterally  An Signs of radiation down both legs with straight leg test XSLR laying: Negative  Increasing mild improvement in range of motion FABER: more stiffness today than previously bilaterally right greater than left Sensory change: Gross sensation intact to all lumbar and sacral dermatomes.  Reflexes: 2+ at both patellar tendons, 2+ at achilles tendons, Babinski's downgoing.  Strength at foot  Continue mild weakness of the lower extremity 4 out of 5 but seems to be symmetric Tendon reflexes are intact.  OMT Findings:  Cervical:   C2 flexed rotated and side bent right  Thoracic: T5 extended rotated and side bent right T7 extended rotated and side bent right Lumbar:  L4 flexed rotated and side bent right Sacrum:  Right on right     Impression and Recommendations:

## 2015-12-04 ENCOUNTER — Ambulatory Visit (INDEPENDENT_AMBULATORY_CARE_PROVIDER_SITE_OTHER)
Admission: RE | Admit: 2015-12-04 | Discharge: 2015-12-04 | Disposition: A | Discharge status: Home / Self Care | Payer: Medicare Other | Source: Ambulatory Visit | Attending: Family Medicine | Admitting: Family Medicine

## 2015-12-04 ENCOUNTER — Ambulatory Visit (HOSPITAL_BASED_OUTPATIENT_CLINIC_OR_DEPARTMENT_OTHER): Payer: Medicare Other | Admitting: Sports Medicine

## 2015-12-04 ENCOUNTER — Encounter: Payer: Self-pay | Admitting: Family Medicine

## 2015-12-04 ENCOUNTER — Ambulatory Visit (HOSPITAL_COMMUNITY)
Admission: RE | Admit: 2015-12-04 | Discharge: 2015-12-04 | Disposition: A | Discharge status: Home / Self Care | Payer: Medicare Other | Source: Ambulatory Visit | Attending: Sports Medicine | Admitting: Sports Medicine

## 2015-12-04 ENCOUNTER — Ambulatory Visit (INDEPENDENT_AMBULATORY_CARE_PROVIDER_SITE_OTHER): Payer: Medicare Other | Admitting: Family Medicine

## 2015-12-04 ENCOUNTER — Encounter: Payer: Self-pay | Admitting: Sports Medicine

## 2015-12-04 VITALS — BP 150/78 | HR 63 | Wt 178.0 lb

## 2015-12-04 VITALS — Ht 69.0 in | Wt 179.0 lb

## 2015-12-04 DIAGNOSIS — M9902 Segmental and somatic dysfunction of thoracic region: Secondary | ICD-10-CM

## 2015-12-04 DIAGNOSIS — M4806 Spinal stenosis, lumbar region: Secondary | ICD-10-CM | POA: Diagnosis not present

## 2015-12-04 DIAGNOSIS — I10 Essential (primary) hypertension: Secondary | ICD-10-CM

## 2015-12-04 DIAGNOSIS — M999 Biomechanical lesion, unspecified: Secondary | ICD-10-CM

## 2015-12-04 DIAGNOSIS — M5416 Radiculopathy, lumbar region: Secondary | ICD-10-CM

## 2015-12-04 DIAGNOSIS — M9903 Segmental and somatic dysfunction of lumbar region: Secondary | ICD-10-CM | POA: Diagnosis not present

## 2015-12-04 DIAGNOSIS — M48061 Spinal stenosis, lumbar region without neurogenic claudication: Secondary | ICD-10-CM

## 2015-12-04 NOTE — Assessment & Plan Note (Signed)
Decision today to treat with OMT was based on Physical Exam  After verbal consent patient was treated with HVLA and ME techniques in thoracic, lumbar and sacral areas  Patient tolerated the procedure well with improvement in symptoms  Patient given exercises, stretches and lifestyle modifications  See medications in patient instructions if given  Patient will follow up in 3-6 weeks

## 2015-12-04 NOTE — Patient Instructions (Signed)
Good to see you  I do think it would be a good idea to get the xray today and we are ordering the MRI.  This will tell us what has changed. Continue the other medicines Check your blood pressure a little closer Drop off the book on Friday  Heat before activity and ice after activity  See me again in 4 weeks.

## 2015-12-04 NOTE — Assessment & Plan Note (Signed)
Patient seems to be having worsening symptoms bilaterally. Last imaging 6 years ago. The x-rays today with patient having continued weakness of the lower extremities I would like to have an MRI. No systemic illnesses at this time but is affecting quality of living as well as daily activities. Encourage patient to monitor for any bowel or bladder incontinence. Started on gabapentin. Return to clinic after MRI and to discuss different treatment options.

## 2015-12-05 LAB — EXERCISE TOLERANCE TEST
Estimated workload: 8.5 METS
Exercise duration (min): 7 min
MPHR: 142 {beats}/min
Peak HR: 137 {beats}/min
Percent HR: 96 %
Rest HR: 56 {beats}/min

## 2015-12-05 NOTE — Progress Notes (Signed)
  For ETT at hospital lab.  See report.

## 2015-12-07 ENCOUNTER — Other Ambulatory Visit: Payer: Self-pay | Admitting: Family Medicine

## 2015-12-14 ENCOUNTER — Encounter: Payer: Medicare Other | Admitting: Sports Medicine

## 2015-12-14 ENCOUNTER — Encounter (HOSPITAL_COMMUNITY): Payer: Medicare Other

## 2015-12-19 ENCOUNTER — Ambulatory Visit
Admission: RE | Admit: 2015-12-19 | Discharge: 2015-12-19 | Disposition: A | Discharge status: Home / Self Care | Payer: Medicare Other | Source: Ambulatory Visit | Attending: Family Medicine | Admitting: Family Medicine

## 2015-12-19 DIAGNOSIS — M5416 Radiculopathy, lumbar region: Principal | ICD-10-CM

## 2015-12-19 DIAGNOSIS — M48061 Spinal stenosis, lumbar region without neurogenic claudication: Secondary | ICD-10-CM

## 2015-12-27 NOTE — Progress Notes (Signed)
Subjective:    CC: Back pain followup  HPI: Patient is a very pleasant  male coming back for osteopathic manipulation.  Patient had an epidural in June 2017. Patient did not respond as well as C7 the past. Was having worsening symptoms. We elected to try a new MRI. MRI was independently visualized by me. MRI patient's lumbar spine show that patient has had progression of arthritis over the course last 6 years. Severe multifactorial spinal stenosis mostly at L2-L5.  Discussed with patient at length. Patient is having some mild weakness in the legs bilaterally. Continues to try to stay active but has not been working out as much. Continues to play local call so fairly regularly. Denies he would consider true weakness. Denies also any constant numbness.   Past Medical History:  Diagnosis Date  . Cataract    Past Surgical History:  Procedure Laterality Date  . TOTAL KNEE ARTHROPLASTY     left  . TRANSURETHRAL RESECTION OF PROSTATE  2011   Social History  Substance Use Topics  . Smoking status: Former Smoker    Quit date: 04/28/1972  . Smokeless tobacco: Never Used  . Alcohol use 3.5 oz/week    7 drink(s) per week     Comment: 1 glass of wine a night   No Known Allergies Family History  Problem Relation Age of Onset  . Diabetes Mother   . Kidney disease Father   . Cancer Sister   . Heart disease Brother   . Cancer Sister     Past medical history, Surgical history, Family history not pertinant except as noted below, Social history, Allergies, and medications have been entered into the medical record, reviewed, and no changes needed.   Review of Systems: No fevers, chills, night sweats, weight loss, chest pain, or shortness of breath. Denies any  change in bowel habits, weakness of the lower Jimmy's.  Objective:   There were no vitals taken for this visit.  General: Well Developed, well nourished, and in no acute distress.  Neuro: Alert and oriented x3, extra-ocular muscles  intact, sensation grossly intact.  HEENT: Normocephalic, atraumatic, pupils equal round reactive to light, neck supple, no masses, no lymphadenopathy, thyroid nonpalpable.  Exam shows patient does have what appears to be a crack to's of the posterior molar on the inferior side. No sign of infection at this time. Tender to palpation on the cheek on the outside. Skin: Warm and dry, no rashes. Does have incisions on knees bilaterally for knee replacement surgery. Cardiac:  no lower extremity edema. Respiratory: Not using accessory muscles, speaking in full sentences. Abdominal: NT, soft Gait: Nonantlagic, good balance and coordination Lymphatic: no lymphadenopathy in neck or axillae on palpation, non tender.  Musculoskeletal: Inspection and palpation of the right and left upper extremities including the shoulders elbows and wrist are unremarkable with full range of motion and good muscle strength and tone. Inspection and palpation of the right and left lower extremities including the hips knees and ankles are unremarkable and nontender with full range of motion and good muscle strength and tone and are symmetric.  Back Exam:  Inspection: Unremarkable  Motion: Flexion 40 deg, Extension 10 deg, Side Bending to 25 deg bilaterally,  Rotation to 25. More tight than previous exam SLR laying tightness of the hamstrings bilaterally  An Signs of radiation down both legs with straight leg test XSLR laying: Negative  Increasing mild improvement in range of motion FABER: more stiffness today than previously bilaterally right greater than left  Sensory change: Gross sensation intact to all lumbar and sacral dermatomes.  Reflexes: 2+ at both patellar tendons, 2+ at achilles tendons, Babinski's downgoing. 1 beat of clonus bilaterally Strength at foot  Patient has some mild weakness of the legs bilaterally little more than previous exam.  OMT Findings:  Cervical:   C2 flexed rotated and side bent  right Thoracic: T5 extended rotated and side bent right T7 extended rotated and side bent right Lumbar:  L4 flexed rotated and side bent right Sacrum:  Right on right     Impression and Recommendations:

## 2015-12-28 ENCOUNTER — Encounter: Payer: Self-pay | Admitting: Family Medicine

## 2015-12-28 ENCOUNTER — Telehealth: Payer: Self-pay | Admitting: Family Medicine

## 2015-12-28 ENCOUNTER — Ambulatory Visit (INDEPENDENT_AMBULATORY_CARE_PROVIDER_SITE_OTHER): Payer: Medicare Other | Admitting: Family Medicine

## 2015-12-28 VITALS — BP 130/74 | HR 78 | Wt 181.0 lb

## 2015-12-28 DIAGNOSIS — M4806 Spinal stenosis, lumbar region: Secondary | ICD-10-CM

## 2015-12-28 DIAGNOSIS — M9904 Segmental and somatic dysfunction of sacral region: Secondary | ICD-10-CM

## 2015-12-28 DIAGNOSIS — M48061 Spinal stenosis, lumbar region without neurogenic claudication: Secondary | ICD-10-CM

## 2015-12-28 DIAGNOSIS — M9903 Segmental and somatic dysfunction of lumbar region: Secondary | ICD-10-CM

## 2015-12-28 DIAGNOSIS — M999 Biomechanical lesion, unspecified: Secondary | ICD-10-CM

## 2015-12-28 DIAGNOSIS — M9902 Segmental and somatic dysfunction of thoracic region: Secondary | ICD-10-CM

## 2015-12-28 DIAGNOSIS — M5416 Radiculopathy, lumbar region: Secondary | ICD-10-CM

## 2015-12-28 MED ORDER — MELOXICAM 7.5 MG PO TABS: 7.5000 mg | ORAL_TABLET | Freq: Every day | ORAL | 0 refills | Status: DC

## 2015-12-28 MED ORDER — SILDENAFIL CITRATE 20 MG PO TABS: ORAL_TABLET | ORAL | 12 refills | Status: DC

## 2015-12-28 NOTE — Telephone Encounter (Signed)
Pt would like his sildenafil increased to 30 per refill.  Please advise

## 2015-12-28 NOTE — Patient Instructions (Addendum)
Good to see you  Ice is your friend Dr. Venetia MaxonStern office will call you Avoid being barefoot.  For the shoulder keep hands within peripheral vision  See me again when you are back for new [place].

## 2015-12-28 NOTE — Assessment & Plan Note (Signed)
Repeat MRI patient's lumbar region that shows progression with severe spinal stenosis. I do believe that patient also is having some increasing weakness in the legs bilaterally. Denies any bowel or bladder incontinence. Patient continues to have significant tightness of the hamstrings with some radicular symptoms bilaterally. Discussed with patient that I do feel that possible further intervention would be necessary. Patient will be referred to neurosurgery for further evaluation. Patient is going out of [place] for the next month. Does have medications for breakthrough pain. Given a very low dose of meloxicam. Patient has tramadol as well. Patient will continue with the gabapentin. We discussed warning signs and when to seek medical attention. Patient verbalized understanding.  Spent  25 minutes with patient face-to-face and had greater than 50% of counseling including as described above in assessment and plan.

## 2015-12-28 NOTE — Assessment & Plan Note (Signed)
Decision today to treat with OMT was based on Physical Exam  After verbal consent patient was treated with HVLA and ME techniques in thoracic, lumbar and sacral areas  Patient tolerated the procedure well with improvement in symptoms  Patient given exercises, stretches and lifestyle modifications  See medications in patient instructions if given  Patient will follow up in 3-6 weeks                         

## 2015-12-29 ENCOUNTER — Other Ambulatory Visit: Payer: Self-pay | Admitting: Family Medicine

## 2016-01-01 NOTE — Telephone Encounter (Signed)
Refill done.  

## 2016-01-30 NOTE — Assessment & Plan Note (Addendum)
Patient would like to continue conservative therapy. We did discuss the possibility of epidural. Was referred to neurosurgery. Patient states has appointment in 2 weeks. I do think that an him knowing his other options could be beneficial. Patient will continue see me every 4-6 weeks.

## 2016-01-30 NOTE — Progress Notes (Signed)
Subjective:    CC: Back pain followup  HPI: Patient is a very pleasant  male coming back for osteopathic manipulation.  Patient had an epidural in June 2017. Was having worsening symptoms. We elected to try a new MRI. MRI was independently visualized by me. MRI patient's lumbar spine show that patient has had progression of arthritis over the course last 6 years. Severe multifactorial spinal stenosis mostly at L2-L5. Was having worsening symptoms and was sent to neurosurgery. Scheduled to see them in 2 weeks. Continuing to have some tightness. Did go on a long drive. Feels that overall having some difficulty.     Past Medical History:  Diagnosis Date  . Cataract    Past Surgical History:  Procedure Laterality Date  . TOTAL KNEE ARTHROPLASTY     left  . TRANSURETHRAL RESECTION OF PROSTATE  2011   Social History  Substance Use Topics  . Smoking status: Former Smoker    Quit date: 04/28/1972  . Smokeless tobacco: Never Used  . Alcohol use 3.5 oz/week    7 drink(s) per week     Comment: 1 glass of wine a night   No Known Allergies Family History  Problem Relation Age of Onset  . Diabetes Mother   . Kidney disease Father   . Cancer Sister   . Heart disease Brother   . Cancer Sister     Past medical history, Surgical history, Family history not pertinant except as noted below, Social history, Allergies, and medications have been entered into the medical record, reviewed, and no changes needed.   Review of Systems: No fevers, chills, night sweats, weight loss, chest pain, or shortness of breath. Denies any  change in bowel habits, weakness of the lower Jimmy's.  Objective:   Blood pressure 132/84, pulse (!) 57, weight 180 lb (81.6 kg), SpO2 99 %.  General: Well Developed, well nourished, and in no acute distress.  Neuro: Alert and oriented x3, extra-ocular muscles intact, sensation grossly intact.  HEENT: Normocephalic, atraumatic, pupils equal round reactive to light, neck  supple, no masses, no lymphadenopathy, thyroid nonpalpable.  Exam shows patient does have what appears to be a crack to's of the posterior molar on the inferior side. No sign of infection at this time. Tender to palpation on the cheek on the outside. Skin: Warm and dry, no rashes. Does have incisions on knees bilaterally for knee replacement surgery. Cardiac:  no lower extremity edema. Respiratory: Not using accessory muscles, speaking in full sentences. Abdominal: NT, soft Gait: Nonantlagic, good balance and coordination Lymphatic: no lymphadenopathy in neck or axillae on palpation, non tender.  Musculoskeletal: Inspection and palpation of the right and left upper extremities including the shoulders elbows and wrist are unremarkable with full range of motion and good muscle strength and tone. Inspection and palpation of the right and left lower extremities including the hips knees and ankles are unremarkable and nontender with full range of motion and good muscle strength and tone and are symmetric.  Back Exam:  Inspection: Unremarkable  Motion: Flexion 40 deg, Extension 10 deg, Side Bending to 25 deg bilaterally,  Rotation to 25. More tight than previous exam Continue to have radicular symptoms with straight leg test mostly on the right side XSLR laying: Negative  Increasing mild improvement in range of motion FABER: more stiffness today than previously bilaterally right greater than left possibly worse than previous exam Sensory change: Gross sensation intact to all lumbar and sacral dermatomes.  Reflexes: 2+ at both patellar tendons,  2+ at achilles tendons, Babinski's downgoing. Clonus improved Strength at foot  4 out of 5 strength of the legs bilaterally.  OMT Findings:  Cervical:   C2 flexed rotated and side bent right Thoracic: T3 extended rotated and side bent right T7 extended rotated and side bent right Lumbar:  L4 flexed rotated and side bent right Sacrum:  Right on  right     Impression and Recommendations:

## 2016-01-30 NOTE — Assessment & Plan Note (Signed)
Decision today to treat with OMT was based on Physical Exam  After verbal consent patient was treated with HVLA and ME techniques in thoracic, lumbar and sacral areas  Patient tolerated the procedure well with improvement in symptoms  Patient given exercises, stretches and lifestyle modifications  See medications in patient instructions if given  Patient will follow up in 4-8 weeks

## 2016-01-31 ENCOUNTER — Encounter: Payer: Self-pay | Admitting: Family Medicine

## 2016-01-31 ENCOUNTER — Ambulatory Visit (INDEPENDENT_AMBULATORY_CARE_PROVIDER_SITE_OTHER): Payer: Medicare Other | Admitting: Family Medicine

## 2016-01-31 VITALS — BP 132/84 | HR 57 | Wt 180.0 lb

## 2016-01-31 DIAGNOSIS — M9902 Segmental and somatic dysfunction of thoracic region: Secondary | ICD-10-CM | POA: Diagnosis not present

## 2016-01-31 DIAGNOSIS — M5416 Radiculopathy, lumbar region: Secondary | ICD-10-CM

## 2016-01-31 DIAGNOSIS — M48061 Spinal stenosis, lumbar region without neurogenic claudication: Secondary | ICD-10-CM

## 2016-01-31 DIAGNOSIS — M999 Biomechanical lesion, unspecified: Secondary | ICD-10-CM

## 2016-01-31 NOTE — Patient Instructions (Signed)
Good to see you  Ice is your friend Thank you again  One pill daily of the other pill Meloxicma still Stay off the gabapentin if no improvement.  See what stern says See me again in 4 week.

## 2016-02-04 ENCOUNTER — Other Ambulatory Visit: Payer: Self-pay | Admitting: Family Medicine

## 2016-02-04 NOTE — Telephone Encounter (Signed)
Refill done.  

## 2016-02-13 ENCOUNTER — Other Ambulatory Visit: Payer: Self-pay | Admitting: Family Medicine

## 2016-02-15 ENCOUNTER — Other Ambulatory Visit: Payer: Self-pay | Admitting: *Deleted

## 2016-02-15 ENCOUNTER — Ambulatory Visit (INDEPENDENT_AMBULATORY_CARE_PROVIDER_SITE_OTHER): Payer: Medicare Other | Admitting: Family Medicine

## 2016-02-15 ENCOUNTER — Encounter: Payer: Self-pay | Admitting: Family Medicine

## 2016-02-15 VITALS — BP 169/76 | HR 53 | Ht 69.0 in | Wt 181.0 lb

## 2016-02-15 DIAGNOSIS — M791 Myalgia: Secondary | ICD-10-CM | POA: Diagnosis not present

## 2016-02-15 DIAGNOSIS — M7918 Myalgia, other site: Secondary | ICD-10-CM

## 2016-02-15 NOTE — Progress Notes (Signed)
  Paul RoanDavid [name] - 81 y.o. male MRN [id number]  Date of birth: 05/08/1934  SUBJECTIVE:  Including CC & ROS.  Chief Complaint  Patient presents with  . Shoulder Pain     [name] is a 81 yo M that is presenting with left shoulder pain. The pain started on Wednesday while he was golfing. He was unable to finish his round. He locates the pain over his left scapula. He has used ice and has noticed an improvement in his pain. He reports performing new exercises that morning before golfing and that may have contributed. He uses mobic for pain. He has no problem lying on that side. He denies any prior surgery or injury on that side.   ROS: No unexpected weight loss, fever, chills, swelling, instability, numbness/tingling, redness, otherwise see HPI    HISTORY: Past Medical, Surgical, Social, and Family History Reviewed & Updated per EMR.   Pertinent Historical Findings include: PMSHx -  Left TKA, left clavicle fracture, spinal stenosis    DATA REVIEWED: None to review   PHYSICAL EXAM:  VS: BP:(!) 169/76  HR:(!) 53bpm  TEMP: ( )  RESP:   HT:5\' 9"  (175.3 cm)   WT:181 lb (82.1 kg)  BMI:26.8 PHYSICAL EXAM: Gen: NAD, alert, cooperative with exam, well-appearing HEENT: clear conjunctiva, EOMI CV:  no edema, capillary refill brisk,  Resp: non-labored, normal speech Skin: no rashes, normal turgor  Neuro: no gross deficits.  Psych:  alert and oriented Left shoulder:  No obvious winging.  Left scapula has less fluid motion compared to right.  Some TTP the rhomboids on the left.  No TTP of the BT or AC joint.  Normal strength with IR and ER to resistance.  Normal Empty can  Normal Hawkin's test  Neurovascularly intact   ASSESSMENT & PLAN:   Rhomboid muscle pain Most likely having left sided scapular dyskinesis. No obvious winging. Exam of rotator cuff is reassuring  - provided home exercises for scapula.  - Advised to try these exercises for a week before implementing the golf workouts    - follow up PRN and consider referral to PT if no improvement if follows up.

## 2016-02-15 NOTE — Progress Notes (Signed)
[facility]: Attending Note: I have reviewed the chart, discussed wit the Sports Medicine Fellow. I agree with assessment and treatment plan as detailed in the Fellow's note. #1. Acute episode of left-sided rhomboid pain during a golf game. After discussion it sounds like he was doing some exercises prior to that round of golf, may have overstressed that particular muscle. Seems to be resolved now. Discussed perhaps doing same type of exercises with 50% of the previous weight and higher reps just to condition this area. He appropriately iced it etc. I see nothing else that would worry me about this area and unless he has a recurrence, follow-up when necessary.

## 2016-02-17 DIAGNOSIS — M7918 Myalgia, other site: Secondary | ICD-10-CM | POA: Insufficient documentation

## 2016-02-17 NOTE — Assessment & Plan Note (Signed)
Most likely having left sided scapular dyskinesis. No obvious winging. Exam of rotator cuff is reassuring  - provided home exercises for scapula.  - Advised to try these exercises for a week before implementing the golf workouts  - follow up PRN and consider referral to PT if no improvement if follows up.

## 2016-02-27 NOTE — Progress Notes (Signed)
Subjective:    CC: Back pain followup  HPI: Patient is a very pleasant  male coming back for osteopathic manipulation.  Patient had an epidural in June 2017. Was having worsening symptoms. We elected to try a new MRI. MRI was independently visualized by me. MRI patient's lumbar spine show that patient has had progression of arthritis over the course last 6 years. Severe multifactorial spinal stenosis mostly at L2-L5. Patient was to see neurosurgery. Patient states Doing very well overall. States that at this time they do not want him to have any type of surgery as long as he continues to be fairly active. Patient will continue conservative therapy. No worsening pain.   Past Medical History:  Diagnosis Date  . Cataract    Past Surgical History:  Procedure Laterality Date  . TOTAL KNEE ARTHROPLASTY     left  . TRANSURETHRAL RESECTION OF PROSTATE  2011   Social History  Substance Use Topics  . Smoking status: Former Smoker    Quit date: 04/28/1972  . Smokeless tobacco: Never Used  . Alcohol use 3.5 oz/week    7 drink(s) per week     Comment: 1 glass of wine a night   No Known Allergies Family History  Problem Relation Age of Onset  . Diabetes Mother   . Kidney disease Father   . Cancer Sister   . Heart disease Brother   . Cancer Sister     Past medical history, Surgical history, Family history not pertinant except as noted below, Social history, Allergies, and medications have been entered into the medical record, reviewed, and no changes needed.   Review of Systems: No fevers, chills, night sweats, weight loss, chest pain, or shortness of breath. Denies any  change in bowel habits, weakness of the lower Jimmy's.  Objective:   Blood pressure 134/80, pulse 62, height 5\' 9"  (1.753 m), weight 179 lb (81.2 kg), SpO2 99 %. Systems examined below as of 02/28/16  General: Well Developed, well nourished, and in no acute distress.  Neuro: Alert and oriented x3, extra-ocular  muscles intact, sensation grossly intact.  HEENT: Normocephalic, atraumatic, pupils equal , neck supple, no masses, no lymphadenopathy, thyroid nonpalpable.   to palpation on the cheek on the outside. Skin: Warm and dry, no rashes.  Cardiac:  no lower extremity edema. Respiratory: Not using accessory muscles, speaking in full sentences. Abdominal: NT, soft Gait: Nonantlagic, good balance and coordination Lymphatic: no lymphadenopathy in neck or axillae on palpation, non tender.  Musculoskeletal: Inspection and palpation of the right and left upper extremities including the shoulders elbows and wrist are unremarkable with full range of motion and good muscle strength and tone. Inspection and palpation of the right and left lower extremities including the hips knees and ankles are unremarkable and nontender with full range of motion and good muscle strength and tone and are symmetric.  Back Exam:  Inspection: Unremarkable  Motion: Flexion 45 deg, Extension 10 deg, Side Bending to 25 deg bilaterally,  Rotation to 25.  XSLR laying: Negative  Increasing mild improvement in range of motion FABER: continue stiffness.  Sensory change: Gross sensation intact to all lumbar and sacral dermatomes.  Reflexes: 2+ at both patellar tendons, 2+ at achilles tendons, Babinski's downgoing. No clonus Strength at foot  4+ out of 5 strength of the legs bilaterally.  OMT Findings:  Cervical:   C2 flexed rotated and side bent right Thoracic: T3 extended rotated and side bent right T7 extended rotated and side bent right  Lumbar:  L5 flexed rotated and side bent right Sacrum:  Right on right     Impression and Recommendations:

## 2016-02-28 ENCOUNTER — Encounter: Payer: Self-pay | Admitting: Family Medicine

## 2016-02-28 ENCOUNTER — Ambulatory Visit (INDEPENDENT_AMBULATORY_CARE_PROVIDER_SITE_OTHER): Payer: Medicare Other | Admitting: Family Medicine

## 2016-02-28 VITALS — BP 134/80 | HR 62 | Ht 69.0 in | Wt 179.0 lb

## 2016-02-28 DIAGNOSIS — M5416 Radiculopathy, lumbar region: Secondary | ICD-10-CM | POA: Diagnosis not present

## 2016-02-28 DIAGNOSIS — M48061 Spinal stenosis, lumbar region without neurogenic claudication: Secondary | ICD-10-CM

## 2016-02-28 DIAGNOSIS — M999 Biomechanical lesion, unspecified: Secondary | ICD-10-CM

## 2016-02-28 NOTE — Assessment & Plan Note (Signed)
Decision today to treat with OMT was based on Physical Exam  After verbal consent patient was treated with HVLA and ME techniques in thoracic, lumbar and sacral areas  Patient tolerated the procedure well with improvement in symptoms  Patient given exercises, stretches and lifestyle modifications  See medications in patient instructions if given  Patient will follow up in 4-6 weeks

## 2016-02-28 NOTE — Assessment & Plan Note (Signed)
Seems more stable than previous exam. Patient does not want to go up on his gabapentin. Continue all other medications at this time. Continue to encourage him to monitor his blood pressure. Patient will follow-up again in 4-6 weeks. She respond well to osteopathic manipulation. No significant change in current treatment.

## 2016-02-28 NOTE — Patient Instructions (Signed)
Good to see you  COntinue wit hthe [place] Keep hands within peripheral vision with lifting See me again in 5-6 weeks.

## 2016-03-26 ENCOUNTER — Ambulatory Visit (INDEPENDENT_AMBULATORY_CARE_PROVIDER_SITE_OTHER): Payer: Medicare Other | Admitting: Family Medicine

## 2016-03-26 ENCOUNTER — Encounter: Payer: Self-pay | Admitting: Family Medicine

## 2016-03-26 DIAGNOSIS — M25512 Pain in left shoulder: Secondary | ICD-10-CM

## 2016-03-27 ENCOUNTER — Other Ambulatory Visit: Payer: Self-pay | Admitting: Family Medicine

## 2016-03-27 NOTE — Assessment & Plan Note (Signed)
LEFT Mild bicep tendinitis. Rec change his workout regimen to: 50-70% WEIGHT HE IS CURRENTLY USING AND INCREASE REPS TO 20, TID FOR BICEP CURLS  Ice.  SHOUDL BE RESOLVING IN NEXT 2-3 WEEKS, IF NOT WE COULD CONSIDER BICEP TENDON INJECTION OR NTG PATCH.HE SEES DR Katrinka BlazingSMITH IN A FEW WEEKS FOR OMT. AND WANTS TO KNOW IF HE COULD GET THE INJECTION FROM HIM (yes)

## 2016-03-27 NOTE — Progress Notes (Signed)
    CHIEF COMPLAINT / HPI:  Left shoulder ?upper arm pain. Several weeks. Only thing he has changed is his golf swing---loosening up a bot to get more distance. Pain is anterior upper arm and radiating into shoulder. Positional. Nothing seems to make it worse or better. No weakness. No swelling or redness. No specific injury. No numbness in LUE  REVIEW OF SYSTEMS:  See HPI  OBJECTIVE:  Vital signs are reviewed.   GEN WDWNNAD SHOULDERs are symmetrical with good muscle bulk and tone. Left bicep tendon area is mildly TTP in the bicipital groove. Bicep muscle strength is intact. Strength intact as is ROM in all planes of rotatr cuff. Very slight pain with active supraspinatus testing. NEURO: distally intact to soft touch B UE VASC Radial pulses 2+B=  US: no defects noted in rotator cuff muscles. A small amount of fluid id  seen surrounding the bicep tendon---the tendon itself is intact.  ASSESSMENT / PLAN: Please see problem oriented charting for details

## 2016-04-03 ENCOUNTER — Other Ambulatory Visit (INDEPENDENT_AMBULATORY_CARE_PROVIDER_SITE_OTHER): Payer: Medicare Other

## 2016-04-03 ENCOUNTER — Encounter: Payer: Self-pay | Admitting: Family Medicine

## 2016-04-03 ENCOUNTER — Ambulatory Visit (INDEPENDENT_AMBULATORY_CARE_PROVIDER_SITE_OTHER): Payer: Medicare Other | Admitting: Family Medicine

## 2016-04-03 VITALS — BP 136/78 | HR 44 | Ht 69.0 in | Wt 178.0 lb

## 2016-04-03 DIAGNOSIS — M9982 Other biomechanical lesions of thoracic region: Secondary | ICD-10-CM | POA: Diagnosis not present

## 2016-04-03 DIAGNOSIS — M5416 Radiculopathy, lumbar region: Secondary | ICD-10-CM | POA: Diagnosis not present

## 2016-04-03 DIAGNOSIS — R358 Other polyuria: Secondary | ICD-10-CM

## 2016-04-03 DIAGNOSIS — M999 Biomechanical lesion, unspecified: Secondary | ICD-10-CM

## 2016-04-03 DIAGNOSIS — R3589 Other polyuria: Secondary | ICD-10-CM

## 2016-04-03 DIAGNOSIS — M48061 Spinal stenosis, lumbar region without neurogenic claudication: Secondary | ICD-10-CM | POA: Diagnosis not present

## 2016-04-03 LAB — URINALYSIS
Bilirubin Urine: NEGATIVE
Hgb urine dipstick: NEGATIVE
Ketones, ur: NEGATIVE
Leukocytes, UA: NEGATIVE
Nitrite: NEGATIVE
Specific Gravity, Urine: 1.01 (ref 1.000–1.030)
Total Protein, Urine: NEGATIVE
Urine Glucose: NEGATIVE
Urobilinogen, UA: 0.2 (ref 0.0–1.0)
pH: 7 (ref 5.0–8.0)

## 2016-04-03 NOTE — Progress Notes (Signed)
Subjective:    CC: Back pain followup  HPI: Patient is a very pleasant  male coming back for osteopathic manipulation.  Patient had an epidural in June 2017. Was having worsening symptoms. We elected to try a new MRI. MRI was independently visualized by me. MRI patient's lumbar spine show that patient has had progression of arthritis over the course last 6 years. Severe multifactorial spinal stenosis mostly at L2-L5. Patient was to see neurosurgery.Wants to continue with conservative therapy. Has had some good results with osteopathic manipulation.  Patient has had some mild increasing urination over the course last week. Patient is wondering if there is a potential for a infection. Denies any fevers chills or any abnormal weight loss. Patient denies any hematuria. No abdominal pain    Past Medical History:  Diagnosis Date  . Cataract    Past Surgical History:  Procedure Laterality Date  . TOTAL KNEE ARTHROPLASTY     left  . TRANSURETHRAL RESECTION OF PROSTATE  2011   Social History  Substance Use Topics  . Smoking status: Former Smoker    Quit date: 04/28/1972  . Smokeless tobacco: Never Used  . Alcohol use 3.5 oz/week    7 drink(s) per week     Comment: 1 glass of wine a night   No Known Allergies Family History  Problem Relation Age of Onset  . Diabetes Mother   . Kidney disease Father   . Cancer Sister   . Heart disease Brother   . Cancer Sister     Past medical history, Surgical history, Family history not pertinant except as noted below, Social history, Allergies, and medications have been entered into the medical record, reviewed, and no changes needed.   Review of Systems: No fevers, chills, night sweats, weight loss, chest pain, or shortness of breath. Denies any  change in bowel habits, weakness of the lower Jimmy's.  Objective:   Blood pressure 136/78, pulse (!) 44, height 5\' 9"  (1.753 m), weight 178 lb (80.7 kg), SpO2 98 %. Systems examined below as of  04/03/16 General: NAD A&O x3 mood, affect normal  HEENT: Pupils equal, extraocular movements intact no nystagmus Respiratory: not short of breath at rest or with speaking Cardiovascular: No lower extremity edema, non tender Skin: Warm dry intact with no signs of infection or rash on extremities or on axial skeleton. Abdomen: Soft nontender, no masses Neuro: Cranial nerves  intact, neurovascularly intact in all extremities with 2+ DTRs and 2+ pulses. Lymph: No lymphadenopathy appreciated today  Gait normal with good balance and coordination.  MSK: Non tender with full range of motion and good stability and symmetric strength and tone of shoulders, elbows, wrist,  knee hips and ankles bilaterally.  Arthritic changes of multiple joints  Back Exam:  Inspection: Unremarkable  Motion: Flexion 40 deg, Extension 8 deg, Side Bending to 25 deg bilaterally,  Rotation to 25. Mild increasing tightness from previous exam XSLR laying: Negative  FABER: continue stiffness. No significant change or  Sensory change: Gross sensation intact to all lumbar and sacral dermatomes.  Reflexes: 2+ at both patellar tendons, 2+ at achilles tendons, Babinski's downgoing. No clonus Strength at foot  4+ out of 5 strength of the legs bilaterally.  OMT Findings:  Cervical:   C3 flexed rotated and side bent right Thoracic: T3 extended rotated and side bent right T7 extended rotated and side bent right Lumbar:  L5  flexed rotated and side bent right Sacrum:  Right on right     Impression  and Recommendations:

## 2016-04-03 NOTE — Assessment & Plan Note (Signed)
Seems to be doing relatively well. No change in management at this time. Encourage him to continue the over-the-counter medications. Has tramadol for break through pain and is not using it regularly. Continues to play golf pretty regularly. Follow-up again in 4 weeks

## 2016-04-03 NOTE — Assessment & Plan Note (Signed)
UA unremarkable. Patient will be sent to primary care provider next month anyhow for his regular physical. Patient at that time if continuing have pain we'll have labs for further evaluate for other causes a polyuria

## 2016-04-03 NOTE — Assessment & Plan Note (Signed)
Decision today to treat with OMT was based on Physical Exam  After verbal consent patient was treated with HVLA and ME techniques in thoracic, lumbar and sacral areas  Patient tolerated the procedure well with improvement in symptoms  Patient given exercises, stretches and lifestyle modifications  See medications in patient instructions if given  Patient will follow up in 4-6 weeks                         

## 2016-04-03 NOTE — Patient Instructions (Signed)
Good to see you  Ice is your friend when you need it COnsider compression sleeve for the arm with activity [facility] at [facility] in the basketball section.  Keep doing what you are doing  Get a urine test downstairs and I will write you  Happy New Year! See you again in 6 weeks.

## 2016-04-04 ENCOUNTER — Encounter: Payer: Self-pay | Admitting: Family Medicine

## 2016-04-07 MED ORDER — MELOXICAM 15 MG PO TABS: 15.0000 mg | ORAL_TABLET | Freq: Every day | ORAL | 0 refills | Status: DC

## 2016-04-11 ENCOUNTER — Ambulatory Visit (INDEPENDENT_AMBULATORY_CARE_PROVIDER_SITE_OTHER): Payer: Medicare Other | Admitting: Family Medicine

## 2016-04-11 ENCOUNTER — Encounter: Payer: Self-pay | Admitting: Family Medicine

## 2016-04-11 VITALS — BP 153/73 | Ht 69.0 in | Wt 178.0 lb

## 2016-04-11 DIAGNOSIS — T148XXA Other injury of unspecified body region, initial encounter: Secondary | ICD-10-CM

## 2016-04-11 DIAGNOSIS — M25512 Pain in left shoulder: Secondary | ICD-10-CM | POA: Diagnosis not present

## 2016-04-11 MED ORDER — MELOXICAM 15 MG PO TABS: ORAL_TABLET | ORAL | 1 refills | Status: DC

## 2016-04-12 ENCOUNTER — Other Ambulatory Visit: Payer: Self-pay | Admitting: Family Medicine

## 2016-04-14 NOTE — Progress Notes (Signed)
  Katherine RoanDavid Cowgill - 81 y.o. male MRN [id number]  Date of birth: 1934/06/20    SUBJECTIVE:      Chief Complaint:/ HPI:   R hip pain---intermittent. Saw Dr Katrinka BlazingSmith who gave him some meloxicam and that resolved it within 2-3 days. Pain is not in joint but at  Top of pelvis laterally when he has it. Worse with rotation or rolling over. 2 F/u left bicep and shoulder pain. Much improved with HEP and di nto get injection and does not feel he needs it now.   ROS:     No fever.No abdominal pain. See HPI  PERTINENT  PMH / PSH FH / / SH:  Past Medical, Surgical, Social, and Family History Reviewed & Updated in the EMR.  Pertinent findings include:  No personal hx of DM Nonsmoker Works out several hours daily Avid golfer  OBJECTIVE: BP (!) 153/73   Ht 5\' 9"  (1.753 m)   Wt 178 lb (80.7 kg)   BMI 26.29 kg/m   Physical Exam:  Vital signs are reviewed. Vital signs reviewed. GENERAL: Well-developed, well-nourished, no acute distress. ABDOMEN: Soft positive bowel sounds, non tender. NEURO: No gross focal neurological deficits. MSK: Movement of extremity x 4.Arright hip" is at top of pelvic bone over muscular insertionea he points to for pain is over anterior iliac crest where tansversus abdominis inserts. No defect noted. Not tender today to palpation. No pain with muscle activation or rotation. Left biceps tendon less ttp. Bicep curl with only mild pain against resistance    ASSESSMENT & PLAN:  1. Abdominal muscle strain--I gave him some more meloxicam to use prn only. We discussed risks of using it daily or long term.. 2. Left bicipital  Tendinitis is improving whth HEP so continue. F/u PRN

## 2016-05-05 ENCOUNTER — Other Ambulatory Visit: Payer: Self-pay | Admitting: Family Medicine

## 2016-05-15 ENCOUNTER — Ambulatory Visit: Payer: Medicare Other | Admitting: Family Medicine

## 2016-05-21 ENCOUNTER — Ambulatory Visit (INDEPENDENT_AMBULATORY_CARE_PROVIDER_SITE_OTHER): Payer: Medicare Other | Admitting: Family Medicine

## 2016-05-21 ENCOUNTER — Encounter: Payer: Self-pay | Admitting: Family Medicine

## 2016-05-21 VITALS — BP 134/88 | HR 57 | Temp 97.8°F | Ht 69.0 in | Wt 183.8 lb

## 2016-05-21 DIAGNOSIS — Z9079 Acquired absence of other genital organ(s): Secondary | ICD-10-CM

## 2016-05-21 DIAGNOSIS — N3289 Other specified disorders of bladder: Secondary | ICD-10-CM

## 2016-05-21 DIAGNOSIS — N183 Chronic kidney disease, stage 3 unspecified: Secondary | ICD-10-CM

## 2016-05-21 DIAGNOSIS — R1310 Dysphagia, unspecified: Secondary | ICD-10-CM | POA: Diagnosis not present

## 2016-05-21 DIAGNOSIS — Z0000 Encounter for general adult medical examination without abnormal findings: Secondary | ICD-10-CM

## 2016-05-21 DIAGNOSIS — N3941 Urge incontinence: Secondary | ICD-10-CM

## 2016-05-21 DIAGNOSIS — I10 Essential (primary) hypertension: Secondary | ICD-10-CM

## 2016-05-21 DIAGNOSIS — E039 Hypothyroidism, unspecified: Secondary | ICD-10-CM

## 2016-05-21 DIAGNOSIS — E785 Hyperlipidemia, unspecified: Secondary | ICD-10-CM

## 2016-05-21 MED ORDER — MIRABEGRON ER 25 MG PO TB24: 25.0000 mg | ORAL_TABLET | Freq: Every day | ORAL | 2 refills | Status: DC

## 2016-05-21 NOTE — Assessment & Plan Note (Signed)
Regarding his health maintenance, MRA sending him to gastroenterology for evaluation of some swallowing issues. I told him just to ask them about setting up a colonoscopy. He has any problems getting this set up, he'll let me know.

## 2016-05-21 NOTE — Assessment & Plan Note (Signed)
We'll check his LDL at next lab draw

## 2016-05-21 NOTE — Patient Instructions (Addendum)
In about 3 weeks send me a message on My chart or call me and I will put in for some lab work for you Regarding the Mobic, use it as needed only. I think we should check your creatinine every 4-6 months if you are going to continue to use it intermittently. I am calling in some myrbetrique for the bladder issue--if that is not helping in 2-3 weeks, let me know and I would likely send you to urology I am sending in another refill on the Mobic  I have put in a referral to GI---for BOTH the swallowing eval and for ultimately a colonoscopy When you get your blood work drawn I will send you a note Great to see you!

## 2016-05-21 NOTE — Assessment & Plan Note (Signed)
We'll try myrbetrique. If this is not on his insurance plan we could consider oxybutynin I would rather go with the type III beta blocker.

## 2016-05-21 NOTE — Progress Notes (Signed)
    CHIEF COMPLAINT / HPI:  CPE  #1. Having some occasional urge incontinence of urine. History of TURP many years ago. No problems starting urinary strain. 2. Has continued to use the meloxicam intermittently for his muscle skeletal aches and pains. Does not use it every day. It really seems to help his issues. #3. Has felt like he's had some issues with swallowing recently. Feels like the food is not going down as easily as previously. He was reading on the Internet about this and became concerned when he saw that this could attention to be a symptom of esophageal cancer. He was never a smoker at one point he was evidently a fairly heavy drinker. Taking only occasional alcoholic this point. #4. Health maintenance. Has been greater than 10 years since he had a colonoscopy. Has questions about whether not he should have another one done. His gastroenterologist has retired.  REVIEW OF SYSTEMS: Review of Systems  Constitutional: Negative for activity change, appetite change and unexpected weight change.  Eyes: Negative for pain and visual disturbance.  Respiratory: Negative for cough and wheezing.   Gastrointestinal: Negative for abdominal pain, diarrhea and constipation.  Genitourinary: Negative for decreased urine volume and difficulty urinating see HPI.  Musculoskeletal: positive for arthralgias, intermittent and multiple joints but esp hips  Skin: Negative for rash.  Psychiatric/Behavioral: Negative for behavioral problems, no sleep disturbance and no agitation.    OBJECTIVE:  Vital signs reviewed GENERALl: Well developed, well nourished, in no acute distress. HEENT: PERRLA, EOMI, sclerae are nonicteric NECK: Supple, FROM, without lymphadenopathy.  THYROID: normal without nodularity CAROTID ARTERIES: without bruits LUNGS: clear to auscultation bilaterally. No wheezes or rales. HEART: Regular rate and rhythm, no murmurs ABDOMEN: soft with positive bowel sounds MSK: MOE x 4. Normal  muscle strength, bulk and tone. SKIN no rash. Normal temperature. NEURO: no focal deficits. Normal gait. Normal balance.    ASSESSMENT / PLAN: Please see problem oriented charting for details

## 2016-05-21 NOTE — Assessment & Plan Note (Signed)
Continue current regimen

## 2016-05-21 NOTE — Assessment & Plan Note (Signed)
Discussed use of NSAIDs. Avoid keep a close eye on his creatinine. We'll check that in 2 weeks and he comes back for lab work and then I would probably check it every 4-6 months depending on the results we get from this checked. He is using meloxicam only as needed but that include several times a week.

## 2016-05-21 NOTE — Assessment & Plan Note (Signed)
We'll check his TSH at next lab draw in 2 weeks.

## 2016-05-22 ENCOUNTER — Encounter: Payer: Self-pay | Admitting: Internal Medicine

## 2016-05-22 NOTE — Progress Notes (Signed)
[name] [address] [place], [place] [address] Phone: [phone] Subjective:    CC: low back pain   OZH:YQMVHQIONG  Paul [name] is a 82 y.o. male coming in with complaint of Low back pain. Discussed with patient at great length. We discussed icing regimen and home exercises. We discussed which activities to do previously. Patient does have known spinal stenosis that is severe with neurogenic claudication. Patient though is able to be working on a regular basis. Has noticed some mild soreness of the back as well as the neck but very minor. Not as much radicular symptoms. Previously. No new symptoms.     Past Medical History:  Diagnosis Date  . Cataract    Past Surgical History:  Procedure Laterality Date  . TOTAL KNEE ARTHROPLASTY     left  . TRANSURETHRAL RESECTION OF PROSTATE  2011   Social History   Social History  . Marital status: Legally Separated    Spouse name: N/A  . Number of children: 0  . Years of education: N/A   Occupational History  . 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 Smoking status: Former Smoker    Quit date: 04/28/1972  . Smokeless tobacco: Never Used  . Alcohol use 3.5 oz/week    7 drink(s) per week     Comment: 1 glass of wine a night  . Drug use: No  . Sexual activity: Yes   Other Topics Concern  . None   Social History Narrative   Health Care POA:    Emergency Contact:    End of Life Plan:    Who lives with you: Lives by himself in 1 story home.    Any pets: none   Diet: Patient has a varied diet of protein, starch, and vegetables.  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   Exercise: Patient golfs several times a week and does yoga at home 2x week.    Seatbelts: Patient reports wearing seat belt when in vehicle.   [name] Exposure/Protection: Patient reports wearing sun screen intermittently.    Hobbies: golfing,  teaching at [employer]         No Known Allergies Family History  Problem Relation Age of Onset  . Diabetes Mother   . Kidney disease Father   . Cancer Sister   . Heart disease Brother   . Cancer Sister     Past medical history, social, surgical and family history all reviewed in electronic medical record.  No pertanent information unless stated regarding to the chief complaint.   Review of Systems:Review of systems updated and as accurate as of 05/23/16  No headache, visual changes, nausea, vomiting, diarrhea, constipation, dizziness, abdominal pain, skin rash, fevers, chills, night sweats, weight loss, swollen lymph nodes, body aches, joint swelling, muscle aches, chest pain, shortness of breath, mood changes.   Objective  Blood pressure (!) 152/82, pulse (!) 58, height 5\' 9"  (1.753 m), weight 181 lb 6.4 oz (82.3 kg). Systems examined below as of 05/23/16   General: No apparent distress alert and oriented x3 mood and affect normal, dressed appropriately.  HEENT: Pupils equal, extraocular movements intact  Respiratory: Patient's speak in full sentences and does not appear short of breath  Cardiovascular: No lower extremity edema, non tender, no erythema  Skin: Warm dry intact with no signs of infection or rash on extremities or  on axial skeleton.  Abdomen: Soft nontender  Neuro: Cranial nerves II through XII are intact, neurovascularly intact in all extremities with 2+ DTRs and 2+ pulses.  Lymph: No lymphadenopathy of posterior or anterior cervical chain or axillae bilaterally.  Gait normal with good balance and coordination.  MSK:  Non tender with full range of motion and good stability and symmetric strength and tone of shoulders, elbows, wrist, hip, knee and ankles bilaterally. Arthritic changes of multiple joint Back Exam:  Inspection: Unremarkable  Motion: Flexion 40 deg, Extension 15 deg, Side Bending to 35 deg bilaterally,  Rotation to 35 deg bilaterally  SLR laying:  Negative  XSLR laying: Negative  Palpable tenderness: Tender to palpation in the paraspinal musculature of the lumbar spine. Mostly over the right side today.Marland Kitchen. FABER: negative. Sensory change: Gross sensation intact to all lumbar and sacral dermatomes.  Reflexes: 2+ at both patellar tendons, 2+ at achilles tendons, Babinski's downgoing.  Strength at foot  Plantar-flexion: 5/5 Dorsi-flexion: 5/5 Eversion: 5/5 Inversion: 5/5  Leg strength  Quad: 5/5 Hamstring: 5/5 Hip flexor: 5/5 Hip abductors: 4/5 but symmetric Gait unremarkable.  Osteopathic findings Cervical C2 flexed rotated and side bent right C4 flexed rotated and side bent left T3 extended rotated and side bent right inhaled third rib T4 extended rotated and side bent left L2 flexed rotated and side bent right Sacrum right on right    Impression and Recommendations:     This case required medical decision making of moderate complexity.      Note: This dictation was prepared with Dragon dictation along with smaller phrase technology. Any transcriptional errors that result from this process are unintentional.

## 2016-05-23 ENCOUNTER — Encounter: Payer: Self-pay | Admitting: Family Medicine

## 2016-05-23 ENCOUNTER — Ambulatory Visit (INDEPENDENT_AMBULATORY_CARE_PROVIDER_SITE_OTHER): Payer: Medicare Other | Admitting: Family Medicine

## 2016-05-23 VITALS — BP 152/82 | HR 58 | Ht 69.0 in | Wt 181.4 lb

## 2016-05-23 DIAGNOSIS — M48061 Spinal stenosis, lumbar region without neurogenic claudication: Secondary | ICD-10-CM

## 2016-05-23 DIAGNOSIS — M5416 Radiculopathy, lumbar region: Secondary | ICD-10-CM | POA: Diagnosis not present

## 2016-05-23 DIAGNOSIS — M9982 Other biomechanical lesions of thoracic region: Secondary | ICD-10-CM

## 2016-05-23 DIAGNOSIS — M999 Biomechanical lesion, unspecified: Secondary | ICD-10-CM

## 2016-05-23 NOTE — Assessment & Plan Note (Signed)
Decision today to treat with OMT was based on Physical Exam  After verbal consent patient was treated with HVLA, ME, FPR techniques in cervical, thoracic, lumbar and sacral areas  Patient tolerated the procedure well with improvement in symptoms  Patient given exercises, stretches and lifestyle modifications  See medications in patient instructions if given  Patient will follow up in 3-4 weeks  

## 2016-05-23 NOTE — Assessment & Plan Note (Signed)
Stable patient did have some increasing muscle tightness in it. We discussed with patient at great length. We discussed icing regimen. Encourage patient to split up his upper and lower body working out. Patient will come back and see me again in 4 weeks.

## 2016-05-23 NOTE — Patient Instructions (Signed)
Good to see you  Ice is still good  Overall a little tighter Maybe break up upper body and lower body on different days.  New exercises for the back  Keep hands within peripheral vision.  See me again in 4 weeks.

## 2016-05-31 ENCOUNTER — Other Ambulatory Visit: Payer: Self-pay | Admitting: Family Medicine

## 2016-06-05 ENCOUNTER — Telehealth: Payer: Self-pay | Admitting: Family Medicine

## 2016-06-05 DIAGNOSIS — N183 Chronic kidney disease, stage 3 unspecified: Secondary | ICD-10-CM

## 2016-06-05 DIAGNOSIS — I10 Essential (primary) hypertension: Secondary | ICD-10-CM

## 2016-06-05 DIAGNOSIS — E039 Hypothyroidism, unspecified: Secondary | ICD-10-CM

## 2016-06-05 DIAGNOSIS — Z8739 Personal history of other diseases of the musculoskeletal system and connective tissue: Secondary | ICD-10-CM

## 2016-06-05 NOTE — Telephone Encounter (Signed)
Dear Cliffton AstersWhite Team Can u let him know I have placed lab orders Can you also check and make sure I did them RIGHT for him to have a future lab at [facility]? THANKS! Denny LevySara Yamili Lichtenwalner

## 2016-06-05 NOTE — Telephone Encounter (Signed)
Pt needs to get orders for labs to take across the street or sent over there. Pt states he has a gout flare up and would need certain labs for that as well. Please call pt when lab orders are ready. ep

## 2016-06-06 NOTE — Telephone Encounter (Signed)
Lab work orders printed. Patient notified. He will pick up and have labs drawn on Monday am. Sunday SpillersSharon T Saunders, CMA

## 2016-06-11 ENCOUNTER — Other Ambulatory Visit: Payer: Self-pay | Admitting: Family Medicine

## 2016-06-14 ENCOUNTER — Other Ambulatory Visit: Payer: Self-pay | Admitting: Family Medicine

## 2016-06-17 ENCOUNTER — Encounter: Payer: Self-pay | Admitting: *Deleted

## 2016-06-19 ENCOUNTER — Encounter: Payer: Self-pay | Admitting: Family Medicine

## 2016-06-19 ENCOUNTER — Ambulatory Visit (INDEPENDENT_AMBULATORY_CARE_PROVIDER_SITE_OTHER): Payer: Medicare Other | Admitting: Family Medicine

## 2016-06-19 VITALS — BP 162/80 | HR 74 | Ht 69.0 in | Wt 181.0 lb

## 2016-06-19 DIAGNOSIS — M48061 Spinal stenosis, lumbar region without neurogenic claudication: Secondary | ICD-10-CM | POA: Diagnosis not present

## 2016-06-19 DIAGNOSIS — M999 Biomechanical lesion, unspecified: Secondary | ICD-10-CM

## 2016-06-19 DIAGNOSIS — M5416 Radiculopathy, lumbar region: Secondary | ICD-10-CM | POA: Diagnosis not present

## 2016-06-19 DIAGNOSIS — M9982 Other biomechanical lesions of thoracic region: Secondary | ICD-10-CM | POA: Diagnosis not present

## 2016-06-19 NOTE — Assessment & Plan Note (Signed)
Stable overall at this time. No significant changes. The last exam. Encourage still the core strengthening instability. Follow-up again in 4-5 weeks.

## 2016-06-19 NOTE — Assessment & Plan Note (Signed)
Decision today to treat with OMT was based on Physical Exam  After verbal consent patient was treated with HVLA, ME, FPR techniques in cervical, thoracic, lumbar and sacral areas  Patient tolerated the procedure well with improvement in symptoms  Patient given exercises, stretches and lifestyle modifications  See medications in patient instructions if given  Patient will follow up in 4-5 weeks 

## 2016-06-19 NOTE — Patient Instructions (Signed)
God to see you  Check with Jennette Kettleeal on the labs.  Ice is your friend Stay active Dark beer may be better but only a little.  See me again in 4-5 weeks!

## 2016-06-19 NOTE — Progress Notes (Signed)
Paul ScaleZach [name] D.O. [facility] [address] [place], [place] [address] Phone: [phone] Subjective:    CC: low back pain   OZH:YQMVHQIONGHPI:Subjective  Paul RoanDavid [name] is a 82 y.o. male coming in with complaint of Low back pain. Known severe spinal stenosis. Has been doing relatively well. Patient states that some changes have been more beneficial. States minimal pain down the legs.  Ice does help Plays golf 4-5 times a week.    Past Medical History:  Diagnosis Date  . Cataract    Past Surgical History:  Procedure Laterality Date  . TOTAL KNEE ARTHROPLASTY     left  . TRANSURETHRAL RESECTION OF PROSTATE  2011   Social History   Social History  . Marital status: Legally Separated    Spouse name: N/A  . Number of children: 0  . Years of education: N/A   Occupational History  . 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 Smoking status: Former Smoker    Quit date: 04/28/1972  . Smokeless tobacco: Never Used  . Alcohol use 3.5 oz/week    7 drink(s) per week     Comment: 1 glass of wine a night  . Drug use: No  . Sexual activity: Yes   Other Topics Concern  . Not on file   Social History Narrative   Health Care POA:    Emergency Contact:    End of Life Plan:    Who lives with you: Lives by himself in 1 story home.    Any pets: none   Diet: Patient has a varied diet of protein, starch, and vegetables.  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   Exercise: Patient golfs several times a week and does yoga at home 2x week.    Seatbelts: Patient reports wearing seat belt when in vehicle.   Wynelle LinkSun Exposure/Protection: Patient reports wearing sun screen intermittently.    Hobbies: golfing, teaching at [employer]         No Known Allergies Family History  Problem Relation Age of Onset  . Diabetes Mother   . Kidney disease Father   . Cancer Sister   . Heart disease Brother   . Cancer  Sister     Past medical history, social, surgical and family history all reviewed in electronic medical record.  No pertanent information unless stated regarding to the chief complaint.   Review of Systems: No headache, visual changes, nausea, vomiting, diarrhea, constipation, dizziness, abdominal pain, skin rash, fevers, chills, night sweats, weight loss, swollen lymph nodes, body aches, joint swelling, muscle aches, chest pain, shortness of breath, mood changes.    Objective  Blood pressure (!) 162/80, pulse 74, height 5\' 9"  (1.753 m), weight 181 lb (82.1 kg).   Systems examined below as of 06/19/16 General: NAD A&O x3 mood, affect normal  HEENT: Pupils equal, extraocular movements intact no nystagmus Respiratory: not short of breath at rest or with speaking Cardiovascular: No lower extremity edema, non tender Skin: Warm dry intact with no signs of infection or rash on extremities or on axial skeleton. Abdomen: Soft nontender, no masses Neuro: Cranial nerves  intact, neurovascularly intact in all extremities with 2+ DTRs and 2+ pulses. Lymph: No lymphadenopathy appreciated today  Gait normal with good balance and coordination.  MSK:  Non tender with full range of motion and good stability and symmetric strength and tone of shoulders, elbows,  wrist, hip, knee and ankles bilaterally. Arthritic changes of multiple joint Back Exam:  Inspection: Unremarkable  Motion: Flexion 40 deg, Extension 15 deg, Side Bending to 30 deg bilaterally,  Rotation to 35 deg bilaterally  SLR laying: Negative  XSLR laying: Negative  Palpable tenderness: Tender to palpation still appears palmar sensory of the thoracal lumbar juncture as well as the lumbosacral junction FABER: negative. Sensory change: Gross sensation intact to all lumbar and sacral dermatomes.  Reflexes: 2+ at both patellar tendons, 2+ at achilles tendons, Babinski's downgoing.  Strength at foot  Plantar-flexion: 5/5 Dorsi-flexion: 5/5  Eversion: 5/5 Inversion: 5/5  Leg strength  Quad: 5/5 Hamstring: 5/5 Hip flexor: 5/5 Hip abductors: 3+/5 but symmetric mild worse.   Osteopathic findings Cervical C4 flexed rotated and side bent left C7 flexed rotated and side bent left T3 extended rotated and side bent right inhaled third rib L3 flexed rotated and side bent right Sacrum right on right     Impression and Recommendations:     This case required medical decision making of moderate complexity.      Note: This dictation was prepared with Dragon dictation along with smaller phrase technology. Any transcriptional errors that result from this process are unintentional.

## 2016-06-22 ENCOUNTER — Other Ambulatory Visit: Payer: Self-pay | Admitting: Family Medicine

## 2016-06-25 ENCOUNTER — Ambulatory Visit: Payer: Medicare Other | Admitting: Family Medicine

## 2016-06-26 LAB — COMPREHENSIVE METABOLIC PANEL
ALT: 14 IU/L (ref 0–44)
AST: 20 IU/L (ref 0–40)
Albumin/Globulin Ratio: 1.4 (ref 1.2–2.2)
Albumin: 4.3 g/dL (ref 3.5–4.8)
Alkaline Phosphatase: 56 IU/L (ref 39–117)
BUN/Creatinine Ratio: 18 (ref 10–24)
BUN: 28 mg/dL — ABNORMAL HIGH (ref 8–27)
Bilirubin Total: 0.4 mg/dL (ref 0.0–1.2)
CO2: 26 mmol/L (ref 18–29)
Calcium: 9.7 mg/dL (ref 8.6–10.2)
Chloride: 99 mmol/L (ref 96–106)
Creatinine, Ser: 1.54 mg/dL — ABNORMAL HIGH (ref 0.76–1.27)
GFR calc Af Amer: 49 mL/min/{1.73_m2} — ABNORMAL LOW (ref >59)
GFR calc non Af Amer: 42 mL/min/{1.73_m2} — ABNORMAL LOW (ref >59)
Globulin, Total: 3.1 g/dL (ref 1.5–4.5)
Glucose: 98 mg/dL (ref 65–99)
Potassium: 4.1 mmol/L (ref 3.5–5.2)
Sodium: 143 mmol/L (ref 134–144)
Total Protein: 7.4 g/dL (ref 6.0–8.5)

## 2016-06-26 LAB — URIC ACID: Uric Acid: 6.5 mg/dL (ref 3.7–8.6)

## 2016-06-26 LAB — TSH: TSH: 5.81 u[IU]/mL — ABNORMAL HIGH (ref 0.450–4.500)

## 2016-06-26 LAB — LDL CHOLESTEROL, DIRECT: LDL Direct: 122 mg/dL — ABNORMAL HIGH (ref 0–99)

## 2016-06-30 NOTE — Telephone Encounter (Signed)
Cancelling order as lab not collected, no need to re-order 

## 2016-07-02 ENCOUNTER — Encounter: Payer: Self-pay | Admitting: Internal Medicine

## 2016-07-02 ENCOUNTER — Ambulatory Visit (INDEPENDENT_AMBULATORY_CARE_PROVIDER_SITE_OTHER): Payer: Medicare Other | Admitting: Internal Medicine

## 2016-07-02 VITALS — BP 150/76 | HR 60 | Ht 69.0 in | Wt 187.5 lb

## 2016-07-02 DIAGNOSIS — Z1211 Encounter for screening for malignant neoplasm of colon: Secondary | ICD-10-CM | POA: Diagnosis not present

## 2016-07-02 DIAGNOSIS — R1319 Other dysphagia: Secondary | ICD-10-CM

## 2016-07-02 DIAGNOSIS — R1310 Dysphagia, unspecified: Secondary | ICD-10-CM | POA: Diagnosis not present

## 2016-07-02 NOTE — Patient Instructions (Signed)
You have been scheduled for an endoscopy. Please follow written instructions given to you at your visit today. If you use inhalers (even only as needed), please bring them with you on the day of your procedure. Your physician has requested that you go to www.startemmi.com and enter the access code given to you at your visit today. This web site gives a general overview about your procedure. However, you should still follow specific instructions given to you by our office regarding your preparation for the procedure.  Your provider has ordered Cologuard testing as an option for colon cancer screening. This is performed by [facility] and may be out of network with your insurance. PRIOR to completing the test, it is YOUR responsibility to contact your insurance about covered benefits for this test. Your out of pocket expense could be anywhere from $0.00 to $649.00. The CPT code to provide to your insurance carrier is [phone].  We have already sent your demographic and insurance information to [facility] and they should contact you within the next week regarding your test. If you have not heard from them within the next week, please call our office at [phone].  If you are age 80 or older, your body mass index should be between 23-30. Your Body mass index is 27.69 kg/m. If this is out of the aforementioned range listed, please consider follow up with your Primary Care Provider.  If you are age 80 or younger, your body mass index should be between 19-25. Your Body mass index is 27.69 kg/m. If this is out of the aformentioned range listed, please consider follow up with your Primary Care Provider.

## 2016-07-02 NOTE — Progress Notes (Signed)
Patient ID: Paul [name], male   DOB: 16-May-1934, 82 y.o.   MRN: [id number] HPI: Paul [name] is a 82 year old male with a past medical history of heartburn, constipation, hypertension, gout, hyperlipidemia who is seen in consultation at the request of Dr. Jennette KettleNeal to evaluate dysphagia. He is here alone today.  The patient reports that he has remote history of heartburn but this is not been an issue for him in many years. Over the last 3 months she's had on and off issues with solid food dysphagia. He's also had trouble swallowing large pills. Food feels like it sticks at the sternal notch. He has not had to regurgitate or vomit due to food sticking. Liquids have not been a problem. He has not lost weight. There is been no nausea, vomiting or early satiety. He read online and is concerned about esophageal cancer. He had an upper endoscopy 20+ years ago and was told he had a hiatal hernia. He does not recall ever having prior esophageal dilatation. There is no family history of esophageal or colon cancer.  He denies change in bowel habits. No diarrhea or constipation. He previously has constipation but has been on MiraLAX daily for the last 4 or 5 years. This works well for him. No blood in his stool or melena. No abdominal pain. He had a colonoscopy 10 years ago with [facility] which was reportedly normal. He has had screening colonoscopy since age 82 and denies a history of polyps  Past Medical History:  Diagnosis Date  . Cataract   . Hypertension     Past Surgical History:  Procedure Laterality Date  . TOTAL KNEE ARTHROPLASTY     left  . TRANSURETHRAL RESECTION OF PROSTATE  2011    Outpatient Medications Prior to Visit  Medication Sig Dispense Refill  . allopurinol (ZYLOPRIM) 100 MG tablet take 1 tablet by mouth once daily 30 tablet 2  . aspirin 81 MG chewable tablet Chew 81 mg by mouth daily.      Marland Kitchen. BOOSTRIX 5-2.5-18.5 injection     . cloNIDine (CATAPRES - DOSED IN MG/24 HR) 0.3 mg/24hr patch  apply 1 patch every week 12 patch 3  . colchicine 0.6 MG tablet take 1 tablet by mouth twice a day if needed 60 tablet 1  . felodipine (PLENDIL) 10 MG 24 hr tablet take 1 tablet by mouth once daily 90 tablet 3  . FLUAD 0.5 ML SUSY     . fluticasone (FLONASE) 50 MCG/ACT nasal spray Place 2 sprays into both nostrils daily. 16 g 6  . lisinopril (PRINIVIL,ZESTRIL) 40 MG tablet take 1 tablet by mouth once daily 90 tablet 3  . meloxicam (MOBIC) 15 MG tablet Take one by mouth daily prn arthralgias 30 tablet 1  . mirabegron ER (MYRBETRIQ) 25 MG TB24 tablet Take 1 tablet (25 mg total) by mouth daily. 30 tablet 2  . polyethylene glycol powder (GLYCOLAX/MIRALAX) powder Take 17 g by mouth 2 (two) times daily at 10 AM and 5 PM. take 17GM (DISSOLVED IN WATER) by mouth twice a day 1054 g 2  . pravastatin (PRAVACHOL) 40 MG tablet Take 1 tablet (40 mg total) by mouth daily. 90 tablet 3  . sildenafil (REVATIO) 20 MG tablet Takeone by mouth as directed for erectile dysfunction 30 tablet 12  . traMADol (ULTRAM) 50 MG tablet Take 1 tablet (50 mg total) by mouth every 12 (twelve) hours as needed. 30 tablet 0  . gabapentin (NEURONTIN) 100 MG capsule take 1 capsule by mouth  once daily at bedtime 30 capsule 6   No facility-administered medications prior to visit.     No Known Allergies  Family History  Problem Relation Age of Onset  . Diabetes Mother   . Kidney disease Father   . Cancer Sister     ? pancreatic  . Heart disease Brother   . Cancer Sister     ? pancreatic  . Colon cancer Neg Hx   . Stomach cancer Neg Hx   . Rectal cancer Neg Hx   . Esophageal cancer Neg Hx   . Liver cancer Neg Hx     Social History  Substance Use Topics  . Smoking status: Former Smoker    Quit date: 04/28/1972  . Smokeless tobacco: Never Used  . Alcohol use 3.5 oz/week    7 Standard drinks or equivalent per week     Comment: 1 glass of wine a night    ROS: As per history of present illness, otherwise negative  BP  (!) 150/76   Pulse 60   Ht 5\' 9"  (1.753 m)   Wt 187 lb 8 oz (85 kg)   BMI 27.69 kg/m  Constitutional: Well-developed and well-nourished. No distress. HEENT: Normocephalic and atraumatic. Oropharynx is clear and moist. No oropharyngeal exudate. Conjunctivae are normal.  No scleral icterus. Neck: Neck supple. Trachea midline. Cardiovascular: Normal rate, regular rhythm and intact distal pulses. No M/R/G Pulmonary/chest: Effort normal and breath sounds normal. No wheezing, rales or rhonchi. Abdominal: Soft, nontender, nondistended. Bowel sounds active throughout. There are no masses palpable. No hepatosplenomegaly. Extremities: no clubbing, cyanosis, or edema Lymphadenopathy: No cervical adenopathy noted. Neurological: Alert and oriented to person place and time. Skin: Skin is warm and dry. No rashes noted. Psychiatric: Normal mood and affect. Behavior is normal.  RELEVANT LABS AND IMAGING: CBC    Component Value Date/Time   WBC 5.7 05/02/2015 0943   RBC 5.10 05/02/2015 0943   HGB 14.1 05/02/2015 0943   HCT 42.0 05/02/2015 0943   PLT 172 05/02/2015 0943   MCV 82.4 05/02/2015 0943   MCH 27.6 05/02/2015 0943   MCHC 33.6 05/02/2015 0943   RDW 15.4 05/02/2015 0943   LYMPHSABS 2.3 05/02/2015 0943   MONOABS 0.6 05/02/2015 0943   EOSABS 0.1 05/02/2015 0943   BASOSABS 0.0 05/02/2015 0943    CMP     Component Value Date/Time   NA 143 06/11/2016 0902   K 4.1 06/11/2016 0902   CL 99 06/11/2016 0902   CO2 26 06/11/2016 0902   GLUCOSE 98 06/11/2016 0902   GLUCOSE 94 05/02/2015 0943   BUN 28 (H) 06/11/2016 0902   CREATININE 1.54 (H) 06/11/2016 0902   CREATININE 1.42 (H) 05/02/2015 0943   CALCIUM 9.7 06/11/2016 0902   PROT 7.4 06/11/2016 0902   ALBUMIN 4.3 06/11/2016 0902   AST 20 06/11/2016 0902   ALT 14 06/11/2016 0902   ALKPHOS 56 06/11/2016 0902   BILITOT 0.4 06/11/2016 0902   GFRNONAA 42 (L) 06/11/2016 0902   GFRAA 49 (L) 06/11/2016 0902    ASSESSMENT/PLAN: 82 year old  male with a past medical history of heartburn, constipation, hypertension, gout, hyperlipidemia who is seen in consultation at the request of Dr. Jennette Kettle to evaluate dysphagia.  1.  esophageal dysphagia -- upper endoscopy recommended for direct visualization and possible dilation. We discussed the risks, benefits and alternatives and he wishes to proceed. Soft diet until endoscopy  2. Colon cancer screening -- we discussed colon cancer screening and the general guidelines to consider discontinuation  of colorectal cancer screening around age 34. He will be 82 later this year. He still remains in good health. We decided to pursue Cologuard testing. Cologuard ordered today. If positive he is aware that colonoscopy would be recommended and he reports that he would be agreeable.  .    NW:GNFA Kerri Perches, Md [address] [place], [place] [address]

## 2016-07-08 ENCOUNTER — Encounter: Payer: Self-pay | Admitting: Internal Medicine

## 2016-07-14 ENCOUNTER — Telehealth: Payer: Self-pay | Admitting: Internal Medicine

## 2016-07-14 NOTE — Telephone Encounter (Signed)
Rec'd from Coralyn Markagle Gatro forward 11 pages to Dr. Rhea BeltonPyrtle

## 2016-07-15 ENCOUNTER — Other Ambulatory Visit: Payer: Self-pay

## 2016-07-15 LAB — COLOGUARD: Cologuard: NEGATIVE

## 2016-07-22 ENCOUNTER — Ambulatory Visit (AMBULATORY_SURGERY_CENTER): Payer: Medicare Other | Admitting: Internal Medicine

## 2016-07-22 ENCOUNTER — Encounter: Payer: Self-pay | Admitting: Internal Medicine

## 2016-07-22 VITALS — BP 135/81 | HR 52 | Temp 97.8°F | Resp 20 | Ht 69.0 in | Wt 187.0 lb

## 2016-07-22 DIAGNOSIS — K2210 Ulcer of esophagus without bleeding: Secondary | ICD-10-CM | POA: Diagnosis not present

## 2016-07-22 DIAGNOSIS — K449 Diaphragmatic hernia without obstruction or gangrene: Secondary | ICD-10-CM

## 2016-07-22 DIAGNOSIS — R1310 Dysphagia, unspecified: Secondary | ICD-10-CM

## 2016-07-22 DIAGNOSIS — K219 Gastro-esophageal reflux disease without esophagitis: Secondary | ICD-10-CM | POA: Diagnosis not present

## 2016-07-22 MED ORDER — PANTOPRAZOLE SODIUM 40 MG PO TBEC: 40.0000 mg | DELAYED_RELEASE_TABLET | Freq: Two times a day (BID) | ORAL | 4 refills | Status: DC

## 2016-07-22 MED ORDER — SODIUM CHLORIDE 0.9 % IV SOLN: 500.0000 mL | INTRAVENOUS | Status: DC

## 2016-07-22 NOTE — Patient Instructions (Signed)
Hand outs given on Esophagitis and hiatal hernia   YOU HAD AN ENDOSCOPIC PROCEDURE TODAY: Refer to the procedure report and other information in the discharge instructions given to you for any specific questions about what was found during the examination. If this information does not answer your questions, please call [place] office at [phone] to clarify.   YOU SHOULD EXPECT: Some feelings of bloating in the abdomen. Passage of more gas than usual. Walking can help get rid of the air that was put into your GI tract during the procedure and reduce the bloating. If you had a lower endoscopy (such as a colonoscopy or flexible sigmoidoscopy) you may notice spotting of blood in your stool or on the toilet paper. Some abdominal soreness may be present for a day or two, also.  DIET: Your first meal following the procedure should be a light meal and then it is ok to progress to your normal diet. A half-sandwich or bowl of soup is an example of a good first meal. Heavy or fried foods are harder to digest and may make you feel nauseous or bloated. Drink plenty of fluids but you should avoid alcoholic beverages for 24 hours. If you had a esophageal dilation, please see attached instructions for diet.    ACTIVITY: Your care partner should take you home directly after the procedure. You should plan to take it easy, moving slowly for the rest of the day. You can resume normal activity the day after the procedure however YOU SHOULD NOT DRIVE, use power tools, machinery or perform tasks that involve climbing or major physical exertion for 24 hours (because of the sedation medicines used during the test).   SYMPTOMS TO REPORT IMMEDIATELY: A gastroenterologist can be reached at any hour. Please call [phone]  for any of the following symptoms:   Following upper endoscopy (EGD, EUS, ERCP, esophageal dilation) Vomiting of blood or coffee ground material  New, significant abdominal pain  New, significant chest  pain or pain under the shoulder blades  Painful or persistently difficult swallowing  New shortness of breath  Black, tarry-looking or red, bloody stools  FOLLOW UP:  If any biopsies were taken you will be contacted by phone or by letter within the next 1-3 weeks. Call [phone]  if you have not heard about the biopsies in 3 weeks.  Please also call with any specific questions about appointments or follow up tests.

## 2016-07-22 NOTE — Op Note (Signed)
[facility] Patient Name: Paul [name] Procedure Date: 07/22/2016 3:10 PM MRN: [id number] Endoscopist: Beverley Fiedler , MD Age: 82 Referring MD:  Date of Birth: 04/08/1935 Gender: Male Account #: [id number] Procedure:                Upper GI endoscopy with biopsy Indications:              Dysphagia Medicines:                Monitored Anesthesia Care Procedure:                Pre-Anesthesia Assessment:                           - Prior to the procedure, a History and Physical                            was performed, and patient medications and                            allergies were reviewed. The patient's tolerance of                            previous anesthesia was also reviewed. The risks                            and benefits of the procedure and the sedation                            options and risks were discussed with the patient.                            All questions were answered, and informed consent                            was obtained. Prior Anticoagulants: The patient has                            taken no previous anticoagulant or antiplatelet                            agents. ASA Grade Assessment: II - A patient with                            mild systemic disease. After reviewing the risks                            and benefits, the patient was deemed in                            satisfactory condition to undergo the procedure.                           After obtaining informed consent, the endoscope was  passed under direct vision. Throughout the                            procedure, the patient's blood pressure, pulse, and                            oxygen saturations were monitored continuously. The                            Endoscope was introduced through the mouth, and                            advanced to the second part of duodenum. The upper                            GI endoscopy was accomplished without  difficulty.                            The patient tolerated the procedure well. Scope In: Scope Out: Findings:                 Severe, ulcerative, esophagitis with no active                            bleeding was found at the gastroesophageal                            junction. Multiple biopsies were obtained with cold                            forceps for histology in a targeted manner at the                            gastroesophageal junction. There is mild luminal                            narrowing which was not dilated due to inflammation.                           A 3 cm hiatal hernia was present.                           The entire examined stomach was normal.                           The examined duodenum was normal. Complications:            No immediate complications. Estimated Blood Loss:     Estimated blood loss was minimal. Impression:               - Severe reflux and erosive esophagitis. Biopsied.                           - 3 cm hiatal hernia.                           -  Normal stomach.                           - Normal examined duodenum. Recommendation:           - Patient has a contact number available for                            emergencies. The signs and symptoms of potential                            delayed complications were discussed with the                            patient. Return to normal activities tomorrow.                            Written discharge instructions were provided to the                            patient.                           - Resume previous diet.                           - Continue present medications.                           - Begin pantoprazole 40 mg twice daily (30 minutes                            before 1st and last meal of the day) x 1 month,                            then daily thereafter.                           - Await pathology results.                           - Office follow-up next available.                            - Repeat EGD if persistent or recurrent dysphagia                            with PPI for probable dilation. Beverley FiedlerJay M Clee Pandit, MD 07/22/2016 3:40:22 PM This report has been signed electronically.

## 2016-07-22 NOTE — Progress Notes (Signed)
Patient awakening,vss,report to rn 

## 2016-07-22 NOTE — Progress Notes (Signed)
Pt. Reports no change in medical or surgical history since pre-visit.   

## 2016-07-22 NOTE — Progress Notes (Signed)
Called to room to assist during endoscopic procedure.  Patient ID and intended procedure confirmed with present staff. Received instructions for my participation in the procedure from the performing physician.  

## 2016-07-23 ENCOUNTER — Telehealth: Payer: Self-pay

## 2016-07-23 NOTE — Telephone Encounter (Signed)
  Follow up Call-  Call back number 07/22/2016  Post procedure Call Back phone  # [phone]  Permission to leave phone message Yes  Some recent data might be hidden     Patient questions:  Do you have a fever, pain , or abdominal swelling? No. Pain Score  0 *  Have you tolerated food without any problems? Yes.    Have you been able to return to your normal activities? Yes.    Do you have any questions about your discharge instructions: Diet   No. Medications  No. Follow up visit  No.  Do you have questions or concerns about your Care? No.  Actions: * If pain score is 4 or above: No action needed, pain <4.

## 2016-07-24 ENCOUNTER — Ambulatory Visit (INDEPENDENT_AMBULATORY_CARE_PROVIDER_SITE_OTHER): Payer: Medicare Other | Admitting: Family Medicine

## 2016-07-24 ENCOUNTER — Encounter: Payer: Self-pay | Admitting: Family Medicine

## 2016-07-24 VITALS — BP 132/84 | HR 53 | Ht 69.0 in | Wt 180.4 lb

## 2016-07-24 DIAGNOSIS — M48061 Spinal stenosis, lumbar region without neurogenic claudication: Secondary | ICD-10-CM

## 2016-07-24 DIAGNOSIS — M5416 Radiculopathy, lumbar region: Secondary | ICD-10-CM

## 2016-07-24 DIAGNOSIS — M999 Biomechanical lesion, unspecified: Secondary | ICD-10-CM | POA: Diagnosis not present

## 2016-07-24 NOTE — Assessment & Plan Note (Signed)
Decision today to treat with OMT was based on Physical Exam  After verbal consent patient was treated with HVLA, ME, FPR techniques in cervical, thoracic, lumbar and sacral areas  Patient tolerated the procedure well with improvement in symptoms  Patient given exercises, stretches and lifestyle modifications  See medications in patient instructions if given  Patient will follow up in 6 weeks 

## 2016-07-24 NOTE — Progress Notes (Signed)
Tawana ScaleZach Quintasia Theroux D.O. [facility] [address] [place], [place] [address] Phone: [phone] Subjective:    CC: low back pain f/u  BJY:NWGNFAOZHYHPI:Subjective  Katherine RoanDavid Reihl is a 82 y.o. male coming in with complaint of Low back pain. Known severe spinal stenosis. Patient is been doing relatively well. Continues to try to be active. Started to increase rowing states that that may have caused some mild increase in pain. States though no significant tingling at this time. Still doing better than he had been months ago. Ice does help Plays golf 4-5 times a week.    Past Medical History:  Diagnosis Date  . Cataract   . Hypertension    Past Surgical History:  Procedure Laterality Date  . TOTAL KNEE ARTHROPLASTY     left  . TRANSURETHRAL RESECTION OF PROSTATE  2011   Social History   Social History  . Marital status: Legally Separated    Spouse name: N/A  . Number of children: 0  . Years of education: N/A   Occupational History  . 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 Smoking status: Former Smoker    Quit date: 04/28/1972  . Smokeless tobacco: Never Used  . Alcohol use 3.5 oz/week    7 Standard drinks or equivalent per week     Comment: 1 glass of wine a night  . Drug use: No  . Sexual activity: Yes   Other Topics Concern  . None   Social History Narrative   Health Care POA:    Emergency Contact:    End of Life Plan:    Who lives with you: Lives by himself in 1 story home.    Any pets: none   Diet: Patient has a varied diet of protein, starch, and vegetables.  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   Exercise: Patient golfs several times a week and does yoga at home 2x week.    Seatbelts: Patient reports wearing seat belt when in vehicle.   Wynelle LinkSun Exposure/Protection: Patient reports wearing sun screen intermittently.    Hobbies: golfing, teaching at [employer]         No Known  Allergies Family History  Problem Relation Age of Onset  . Diabetes Mother   . Kidney disease Father   . Cancer Sister     ? pancreatic  . Heart disease Brother   . Cancer Sister     ? pancreatic  . Colon cancer Neg Hx   . Stomach cancer Neg Hx   . Rectal cancer Neg Hx   . Esophageal cancer Neg Hx   . Liver cancer Neg Hx     Past medical history, social, surgical and family history all reviewed in electronic medical record.  No pertanent information unless stated regarding to the chief complaint.   Review of Systems: No headache, visual changes, nausea, vomiting, diarrhea, constipation, dizziness, abdominal pain, skin rash, fevers, chills, night sweats, weight loss, swollen lymph nodes, body aches, joint swelling, muscle aches, chest pain, shortness of breath, mood changes.      Objective  Blood pressure 132/84, pulse (!) 53, height 5\' 9"  (1.753 m), weight 180 lb 6.4 oz (81.8 kg), SpO2 98 %.   Systems examined below as of 07/24/16 General: NAD A&O x3 mood, affect normal  HEENT: Pupils equal, extraocular movements intact no nystagmus Respiratory: not short of breath at rest or with speaking  Cardiovascular: No lower extremity edema, non tender Skin: Warm dry intact with no signs of infection or rash on extremities or on axial skeleton. Abdomen: Soft nontender, no masses Neuro: Cranial nerves  intact, neurovascularly intact in all extremities with 2+ DTRs and 2+ pulses. Lymph: No lymphadenopathy appreciated today  Gait normal with good balance and coordination.  MSK:  Non tender with full range of motion and good stability and symmetric strength and tone of shoulders, elbows, wrist, hip, knee and ankles bilaterally. Arthritic changes of multiple joint Back Exam:  Inspection: Unremarkable  Motion: Flexion 45 deg, Extension 10 deg, Side Bending to 35 deg bilaterally,  Rotation to 35 deg bilaterally  SLR laying: Negative  XSLR laying: Negative  Palpable tenderness: Mild  tenderness to palpation in the paraspinal musculature of the lumbar spine again. FABER: Worsening tenderness bilaterally. Sensory change: Gross sensation intact to all lumbar and sacral dermatomes.  Reflexes: 2+ at both patellar tendons, 2+ at achilles tendons, Babinski's downgoing.  Strength at foot  Plantar-flexion: 5/5 Dorsi-flexion: 5/5 Eversion: 5/5 Inversion: 5/5  Leg strength  Quad: 5/5 Hamstring: 5/5 Hip flexor: 5/5 Hip abductors: 4/5 but symmetric Gait unremarkable.   Osteopathic findings Cervical C2 flexed rotated and side bent right C4 flexed rotated and side bent left T3 extended rotated and side bent right inhaled third rib T7 extended rotated and side bent left L3 flexed rotated and side bent right Sacrum right on right     Impression and Recommendations:     This case required medical decision making of moderate complexity.      Note: This dictation was prepared with Dragon dictation along with smaller phrase technology. Any transcriptional errors that result from this process are unintentional.

## 2016-07-24 NOTE — Patient Instructions (Signed)
Have a great round See me again in 6 weeks.

## 2016-07-24 NOTE — Assessment & Plan Note (Signed)
Stable at the moment. Responding well to conservative therapy including home exercises. No change in medications and does have tramadol and meloxicam for breakthrough pain. Patient come back and see me again in 6 weeks for further evaluation and treatment.

## 2016-07-28 ENCOUNTER — Encounter: Payer: Self-pay | Admitting: Internal Medicine

## 2016-08-05 ENCOUNTER — Other Ambulatory Visit: Payer: Self-pay | Admitting: Family Medicine

## 2016-08-28 ENCOUNTER — Encounter: Payer: Self-pay | Admitting: *Deleted

## 2016-08-30 ENCOUNTER — Other Ambulatory Visit: Payer: Self-pay | Admitting: Family Medicine

## 2016-08-30 DIAGNOSIS — Z9079 Acquired absence of other genital organ(s): Secondary | ICD-10-CM

## 2016-08-30 DIAGNOSIS — N3289 Other specified disorders of bladder: Secondary | ICD-10-CM

## 2016-09-03 ENCOUNTER — Ambulatory Visit (INDEPENDENT_AMBULATORY_CARE_PROVIDER_SITE_OTHER): Payer: Medicare Other | Admitting: Family Medicine

## 2016-09-03 ENCOUNTER — Encounter: Payer: Self-pay | Admitting: Family Medicine

## 2016-09-03 VITALS — BP 166/88 | HR 84 | Resp 16 | Wt 184.0 lb

## 2016-09-03 DIAGNOSIS — M999 Biomechanical lesion, unspecified: Secondary | ICD-10-CM

## 2016-09-03 DIAGNOSIS — M48061 Spinal stenosis, lumbar region without neurogenic claudication: Secondary | ICD-10-CM

## 2016-09-03 DIAGNOSIS — M5416 Radiculopathy, lumbar region: Secondary | ICD-10-CM

## 2016-09-03 NOTE — Patient Instructions (Signed)
You are doing amazing! Ice is your friend Try to tape wrist before the round just proximal to the wrist bones.  The back is great continue the rowing and the [place] chi pennsaid pinkie amount topically 2 times daily as needed.  See me again in 6 weeks.

## 2016-09-03 NOTE — Assessment & Plan Note (Signed)
Decision today to treat with OMT was based on Physical Exam  After verbal consent patient was treated with HVLA, ME, FPR techniques in cervical, thoracic, lumbar and sacral areas  Patient tolerated the procedure well with improvement in symptoms  Patient given exercises, stretches and lifestyle modifications  See medications in patient instructions if given  Patient will follow up in 4-8 weeks 

## 2016-09-03 NOTE — Assessment & Plan Note (Signed)
Stable Doing well  No large changes responding to OMT  Discussed continue rowing and [place] RTC in 2-3 months.

## 2016-09-03 NOTE — Progress Notes (Signed)
Tawana ScaleZach Hadyn Blanck D.O. [facility] [address] [name], [place] [address] Phone: [phone] Subjective:    CC: low back pain f/u  BJY:NWGNFAOZHYHPI:Subjective  Paul RoanDavid [name] is a 82 y.o. male coming in with complaint of Low back pain. Known severe spinal stenosis. Patient states has been doing relatively well. Patient has been very active. Continues to play golf Patient states that rowing has been very beneficial. Continue same medications. Just some mild increasing tightness.  Past Medical History:  Diagnosis Date  . Cataract   . GERD (gastroesophageal reflux disease)   . Hiatal hernia   . Hypertension    Past Surgical History:  Procedure Laterality Date  . TOTAL KNEE ARTHROPLASTY     left  . TRANSURETHRAL RESECTION OF PROSTATE  2011   Social History   Social History  . Marital status: Legally Separated    Spouse name: N/A  . Number of children: 0  . Years of education: N/A   Occupational History  . 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 Smoking status: Former Smoker    Quit date: 04/28/1972  . Smokeless tobacco: Never Used  . Alcohol use 3.5 oz/week    7 Standard drinks or equivalent per week     Comment: 1 glass of wine a night  . Drug use: No  . Sexual activity: Yes   Other Topics Concern  . Not on file   Social History Narrative   Health Care POA:    Emergency Contact:    End of Life Plan:    Who lives with you: Lives by himself in 1 story home.    Any pets: none   Diet: Patient has a varied diet of protein, starch, and vegetables.  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   Exercise: Patient golfs several times a week and does yoga at home 2x week.    Seatbelts: Patient reports wearing seat belt when in vehicle.   Wynelle LinkSun Exposure/Protection: Patient reports wearing sun screen intermittently.    Hobbies: golfing, teaching at [employer]         No Known Allergies Family  History  Problem Relation Age of Onset  . Diabetes Mother   . Kidney disease Father   . Cancer Sister     ? pancreatic  . Heart disease Brother   . Cancer Sister     ? pancreatic  . Colon cancer Neg Hx   . Stomach cancer Neg Hx   . Rectal cancer Neg Hx   . Esophageal cancer Neg Hx   . Liver cancer Neg Hx     Past medical history, social, surgical and family history all reviewed in electronic medical record.  No pertanent information unless stated regarding to the chief complaint.   Review of Systems: No headache, visual changes, nausea, vomiting, diarrhea, constipation, dizziness, abdominal pain, skin rash, fevers, chills, night sweats, weight loss, swollen lymph nodes, body aches, joint swelling, chest pain, shortness of breath, mood changes.    Positive muscle aches   Objective  There were no vitals taken for this visit.   Systems examined below as of 09/03/16 General: NAD A&O x3 mood, affect normal  HEENT: Pupils equal, extraocular movements intact no nystagmus Respiratory: not short of breath at rest or with speaking Cardiovascular: No lower extremity edema, non tender Skin: Warm dry intact with no signs of infection or rash on extremities or  on axial skeleton. Abdomen: Soft nontender, no masses Neuro: Cranial nerves  intact, neurovascularly intact in all extremities with 2+ DTRs and 2+ pulses. Lymph: No lymphadenopathy appreciated today  Gait normal with good balance and coordination.  MSK: Non tender with full range of motion and good stability and symmetric strength and tone of shoulders, elbows, wrist,  knee hips and ankles bilaterally.  Significant arthritic changes of multiple joints especially hands  Back Exam:  Inspection: Unremarkable  Motion: Flexion 35 deg, Extension 15 deg, Side Bending to 45 deg bilaterally,  Rotation to 45 deg bilaterally  SLR laying: Negative  XSLR laying: Negative  Palpable tenderness: tender to palpation in the L5-S1 area FABER:  tightness bilaterally. . Sensory change: Gross sensation intact to all lumbar and sacral dermatomes.  Reflexes: 2+ at both patellar tendons, 2+ at achilles tendons, Babinski's downgoing.  Strength at foot  Plantar-flexion: 5/5 Dorsi-flexion: 5/5 Eversion: 5/5 Inversion: 5/5  Leg strength  Quad: 5/5 Hamstring: 5/5 Hip flexor: 5/5 Hip abductors: 5/5  Gait unremarkable.    Osteopathic findings C4 flexed rotated and side bent left T3 extended rotated and side bent right inhaled third rib T6 extended rotated and side bent left L2 flexed rotated and side bent right Sacrum right on right      Impression and Recommendations:     This case required medical decision making of moderate complexity.      Note: This dictation was prepared with Dragon dictation along with smaller phrase technology. Any transcriptional errors that result from this process are unintentional.

## 2016-09-10 ENCOUNTER — Other Ambulatory Visit (INDEPENDENT_AMBULATORY_CARE_PROVIDER_SITE_OTHER): Payer: Medicare Other

## 2016-09-10 ENCOUNTER — Ambulatory Visit (INDEPENDENT_AMBULATORY_CARE_PROVIDER_SITE_OTHER): Payer: Medicare Other | Admitting: Internal Medicine

## 2016-09-10 ENCOUNTER — Encounter: Payer: Self-pay | Admitting: Internal Medicine

## 2016-09-10 VITALS — BP 168/90 | HR 56 | Ht 67.13 in | Wt 181.4 lb

## 2016-09-10 DIAGNOSIS — R1013 Epigastric pain: Secondary | ICD-10-CM

## 2016-09-10 DIAGNOSIS — K2100 Gastro-esophageal reflux disease with esophagitis, without bleeding: Secondary | ICD-10-CM

## 2016-09-10 LAB — BASIC METABOLIC PANEL
BUN: 32 mg/dL — ABNORMAL HIGH (ref 6–23)
CO2: 29 mEq/L (ref 19–32)
Calcium: 9.7 mg/dL (ref 8.4–10.5)
Chloride: 101 mEq/L (ref 96–112)
Creatinine, Ser: 1.63 mg/dL — ABNORMAL HIGH (ref 0.40–1.50)
GFR: 52.67 mL/min — ABNORMAL LOW (ref >60.00)
Glucose, Bld: 92 mg/dL (ref 70–99)
Potassium: 4.6 mEq/L (ref 3.5–5.1)
Sodium: 138 mEq/L (ref 135–145)

## 2016-09-10 MED ORDER — RANITIDINE HCL 150 MG PO TABS: 150.0000 mg | ORAL_TABLET | Freq: Two times a day (BID) | ORAL | 3 refills | Status: DC

## 2016-09-10 NOTE — Telephone Encounter (Signed)
Although this note indicates that information was forwarded to Dr Rhea BeltonPyrtle, our office has never received these records. We have requested these records 2 additional [facility] but have not had any success getting these records resent. Per Dr Rhea BeltonPyrtle, no need to continue requesting these records.

## 2016-09-10 NOTE — Progress Notes (Signed)
Subjective:    Patient ID: Paul [name], male    DOB: 1934-12-27, 82 y.o.   MRN: [id number]  HPI Paul [name] is a 82 yo male with History of heartburn with dysphagia recently found to have ulcerative reflux esophagitis who is here for follow-up. He was initially seen on 07/02/2016 in the office and then for upper endoscopy on 07/22/2016. He also has a history of hypertension, gout, hyperlipidemia and erectile dysfunction.  Upper endoscopy showed severe ulcerative esophagitis at the GE junction which was biopsied. There was mild luminal narrowing not dilated due to inflammation. A 3 cm hiatal hernia and a normal examined stomach and duodenum. Biopsies were benign but did show reflux inflammation.  After the procedure we started pantoprazole 40 mg twice a day before meals and he returns for follow-up.  He reports that prior to pantoprazole he was only having intermittent dysphagia as we discussed previously. Since starting this he has noticed burning epigastric/subxiphoid pain. This is occurring nocturnally around 2 AM. Seems to be worse if he eats chocolate. Does not keep him from sleeping but is uncomfortable. Is not present in the daytime. His dysphagia has improved and resolved completely. He denies nausea and vomiting. Appetite has been good.  He feels that his sildenafil may be worsening his reflux by work on smooth muscle. He has been researching this. He uses this approximately one day per week for erectile dysfunction. He uses between 60-100 mg per dose.  He did return Cologuard which was negative  Review of Systems As per history of present illness, otherwise negative  Current Medications, Allergies, Past Medical History, Past Surgical History, Family History and Social History were reviewed in [facility] electronic medical record.     Objective:   Physical Exam BP (!) 168/90 (BP Location: Left Arm, Patient Position: Sitting, Cuff Size: Normal)   Pulse (!) 56   Ht 5' 7.13"  (1.705 m) Comment: height measured wihtout shoes  Wt 181 lb 6 oz (82.3 kg)   BMI 28.30 kg/m  Constitutional: Well-developed and well-nourished. No distress. HEENT: Normocephalic and atraumatic. Oropharynx is clear and moist. Conjunctivae are normal.  No scleral icterus. Neck: Neck supple. Trachea midline. Cardiovascular: Normal rate, regular rhythm and intact distal pulses. 2/6 sem Pulmonary/chest: Effort normal and breath sounds normal. No wheezing, rales or rhonchi. Abdominal: Soft, nontender, nondistended. Bowel sounds active throughout. There are no masses palpable. No hepatosplenomegaly. Extremities: no clubbing, cyanosis, or edema Neurological: Alert and oriented to person place and time. Skin: Skin is warm and dry. Psychiatric: Normal mood and affect. Behavior is normal.      Assessment & Plan:   82 yo male with History of heartburn with dysphagia recently found to have ulcerative reflux esophagitis who is here for follow-up  1. GERD with ulcerative reflux-induced esophagitis -- he is proceeding some nocturnal epigastric abdominal pain now though it is unclear to me why this is present now on twice a day PPI. His dysphagia has resolved. We did review antireflux diet and specifically triggers such as chocolate, alcohol, peppermint, acidic and spicy foods. He will try to avoid these. Biopsies were benign. I recommended that we change pantoprazole to 40 mg 30 minutes before breakfast daily. Add ranitidine 150 mg at bedtime. Continue this regimen until June 15. If symptoms completely resolve drop pantoprazole and continue ranitidine 150 mg daily at bedtime plus antireflux diet measures. If symptoms do not resolve he should contact me for further recommendations. He is happy with this plan. --Check basic metabolic  panel today to ensure stable renal function on PPI  2. CRC screening -- Cologuard negative 2018  3-4 month follow-up, sooner if necessary 25 minutes spent with the patient  today. Greater than 50% was spent in counseling and coordination of care with the patient

## 2016-09-10 NOTE — Patient Instructions (Addendum)
Your physician has requested that you go to the basement for the following lab work before leaving today: BMP  Decrease your pantoprazole to 40 mg every morning.  We have sent the following medications to your pharmacy for you to pick up at your convenience: Zantac 150 mg every night  Take the pantoprazole 40 mg every morning and zantac 150 mg every night until 10/10/16. If your reflux symptoms have improved by that time, you may discontinue the pantoprazole and instead take only the zantac every night. If your symptoms have NOT improved, please call our office and let us know.  Please follow up with Dr Rhea BeltonPyrtle in 3-4 months.  If you are age 80 or older, your body mass index should be between 23-30. Your Body mass index is 28.3 kg/m. If this is out of the aforementioned range listed, please consider follow up with your Primary Care Provider.  If you are age 80 or younger, your body mass index should be between 19-25. Your Body mass index is 28.3 kg/m. If this is out of the aformentioned range listed, please consider follow up with your Primary Care Provider.

## 2016-09-14 ENCOUNTER — Encounter: Payer: Self-pay | Admitting: Family Medicine

## 2016-09-17 ENCOUNTER — Telehealth: Payer: Self-pay | Admitting: Internal Medicine

## 2016-09-17 NOTE — Telephone Encounter (Signed)
Rec'd from [facility] forward 8 pages to Dr. Erick BlinksPyrtle Jay

## 2016-09-24 ENCOUNTER — Ambulatory Visit (INDEPENDENT_AMBULATORY_CARE_PROVIDER_SITE_OTHER): Payer: Medicare Other | Admitting: Family Medicine

## 2016-09-24 ENCOUNTER — Encounter: Payer: Self-pay | Admitting: Family Medicine

## 2016-09-24 VITALS — BP 162/82 | HR 55 | Temp 97.8°F | Ht 67.0 in | Wt 179.4 lb

## 2016-09-24 DIAGNOSIS — I10 Essential (primary) hypertension: Secondary | ICD-10-CM | POA: Diagnosis not present

## 2016-09-24 DIAGNOSIS — R011 Cardiac murmur, unspecified: Secondary | ICD-10-CM | POA: Diagnosis not present

## 2016-09-24 DIAGNOSIS — N183 Chronic kidney disease, stage 3 unspecified: Secondary | ICD-10-CM

## 2016-09-24 DIAGNOSIS — E785 Hyperlipidemia, unspecified: Secondary | ICD-10-CM

## 2016-09-24 MED ORDER — HYDROCHLOROTHIAZIDE 25 MG PO TABS: 25.0000 mg | ORAL_TABLET | Freq: Every day | ORAL | 3 refills | Status: DC

## 2016-09-24 NOTE — Assessment & Plan Note (Signed)
Reviewed his recent lab work. His CK D is stable. He is currently on 40 mg of lisinopril and his GFR is 52. We are adding diuretic so I will likely recheck this when I see him back.

## 2016-09-24 NOTE — Patient Instructions (Signed)
We will add one tablet of HCTZ a day. Continue other meds.  See if you can get some BP readings for me--vary the times of day. Maybe 3-4 readings a WEEK! Let me see you in a month

## 2016-09-24 NOTE — Assessment & Plan Note (Signed)
I reviewed blood pressures over the last 18 months. He's had a few elevated intermittently but in the last month she's had 3 that are in the 160 range. He agrees to check his blood pressure to 3 times a day for the next 3 days to get near baseline. Then he will add the HCTZ 25 mg. I will see him back in one month. We discussed potential side effects. He was most concerned about possible. Sexual dysfunction.

## 2016-09-24 NOTE — Assessment & Plan Note (Signed)
LDL was 107. He's been on Pravachol. He does not recall specifically why he was on that medicine. He agrees to try more powerful statin in the future. We elected not to change medications 1 are also adding a new blood pressure medicine. He's tolerating the HCTZ at next office visit, I would switch him from Pravachol to atorvastatin.

## 2016-09-24 NOTE — Progress Notes (Signed)
    CHIEF COMPLAINT / HPI:   #1. Heart murmur: His gastroenterologist heard a soft heart murmur at his last office visit. He is asymptomatic. No one has ever mentioned this to him before   #2. Elevated t blood pressure. He reports several readings recently in the 1 5160 systolic range. Does not recall ever having this before. He has no chest pain, no change in exercise tolerance. No lower extremity edema. No shortness of breath. No PND. #3. With me to look at a area on his left wrist where he sometimes notices soft tissue swelling. Intermittently he has a very brief pain if he turns his head a certain way. Most importantly, this does not interfere with his golf swing. REVIEW OF SYSTEMS:  See history of present illness PERTINENT  PMH / PSH: I have reviewed the patient's medications, allergies, past medical and surgical history, smoking status.  Pertinent findings that relate to today's visit / issues include: Hypertension currently on 3 medications Nonsmoker Exercises daily No history of coronary artery disease  OBJECTIVE:  Vital signs are reviewed.   Vital signs reviewed. GENERAL: Well-developed, well-nourished, no acute distress. CARDIOVASCULAR: Regular rate and rhythm ; there is a 2 out of 6 soft systolic ejection murmur heard best at the left sternal border that does not radiate. LUNGS: Clear to auscultation bilaterally, no rales or wheeze. ABDOMEN: Soft positive bowel sounds NEURO: No gross focal neurological deficits. MSK: Movement of extremity x 4. Left wrist full range of motion flexion extension and ulnar and radial deviation. Small soft tissue mass that is mobile and a formidable noted in the left anatomic snuffbox and extending above the abductor pollicis brevis. The tendon itself is intact. He has normal range of motion and strength in all planes of the thumb. VASCULAR: There is no palpable pulse within the mass. The radial pulse is separate and 2+ and  symmetrical.,    ASSESSMENT / PLAN: Please see problem oriented charting for details. Additionally: #3. Soft tissue mass most consistent with a small ganglion cyst probably arising from the scapholunate joint of his left hand. It does not interfere with his golf game so I would be reluctant to do anything with this. Should it worsen, he will follow-up. 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

## 2016-09-27 ENCOUNTER — Encounter: Payer: Self-pay | Admitting: Family Medicine

## 2016-09-29 ENCOUNTER — Other Ambulatory Visit: Payer: Self-pay

## 2016-09-29 ENCOUNTER — Telehealth: Payer: Self-pay | Admitting: Family Medicine

## 2016-09-29 ENCOUNTER — Ambulatory Visit (HOSPITAL_COMMUNITY): Payer: Medicare Other | Attending: Cardiovascular Disease

## 2016-09-29 DIAGNOSIS — I5030 Unspecified diastolic (congestive) heart failure: Secondary | ICD-10-CM | POA: Diagnosis not present

## 2016-09-29 DIAGNOSIS — R011 Cardiac murmur, unspecified: Secondary | ICD-10-CM | POA: Diagnosis not present

## 2016-09-29 LAB — ECHOCARDIOGRAM COMPLETE
Ao-asc: 37 cm
Area-P 1/2: 2.37 cm2
E decel time: 317 msec
E/e' ratio: 11.63
FS: 33 % (ref 28–44)
IVS/LV PW RATIO, ED: 0.9
LA ID, A-P, ES: 37 mm
LA diam end sys: 37 mm
LA diam index: 1.92 cm/m2
LA vol A4C: 32.1 ml
LA vol index: 22.5 mL/m2
LA vol: 43.5 mL
LV E/e' medial: 11.63
LV E/e'average: 11.63
LV PW d: 10 mm — AB (ref 0.6–1.1)
LV e' LATERAL: 7.05 cm/s
LVOT SV: 98 mL
LVOT VTI: 31.2 cm
LVOT area: 3.14 cm2
LVOT diameter: 20 mm
LVOT peak grad rest: 8 mmHg
LVOT peak vel: 142 cm/s
Lateral S' vel: 12.8 cm/s
MV Dec: 317
MV Peak grad: 3 mmHg
MV pk A vel: 94.6 m/s
MV pk E vel: 82 m/s
P 1/2 time: 93 ms
Reg peak vel: 240 cm/s
TAPSE: 33 mm
TDI e' lateral: 7.05
TDI e' medial: 5.35
TR max vel: 240 cm/s

## 2016-09-29 NOTE — Progress Notes (Unsigned)
ECHO report read and I will communicate with him via My Chart

## 2016-09-29 NOTE — Telephone Encounter (Signed)
LVM for pt to call office back to inform him of below.  If pt calls back please inform him. Paul [name], April D, [place]

## 2016-09-29 NOTE — Telephone Encounter (Signed)
Dear White TeamCliffton Asters Please let him know: 1. the ECHO results are in and they look A OK.  2. There is some thickening of a valve that is common with aging and some slight stiffness of the ventricle--_ALSO NORMAL with aging--- 3. the valve thickening is likely source of his murmur so I would do nothing additional.  Very reassuring.  THANKS! Denny LevySara Neal

## 2016-10-01 ENCOUNTER — Encounter: Payer: Self-pay | Admitting: Family Medicine

## 2016-10-04 ENCOUNTER — Other Ambulatory Visit: Payer: Self-pay | Admitting: Family Medicine

## 2016-10-07 NOTE — Telephone Encounter (Signed)
Second attempt to reach pt to give him the below message, if he calls back please inform him of this. Lamonte SakaiZimmerman Rumple, Champagne Paletta D, [place]

## 2016-10-08 ENCOUNTER — Other Ambulatory Visit: Payer: Self-pay | Admitting: Family Medicine

## 2016-10-08 NOTE — Telephone Encounter (Signed)
Pt was advised. ep °

## 2016-10-15 ENCOUNTER — Encounter: Payer: Self-pay | Admitting: Family Medicine

## 2016-10-15 ENCOUNTER — Ambulatory Visit (INDEPENDENT_AMBULATORY_CARE_PROVIDER_SITE_OTHER): Payer: Medicare Other | Admitting: Family Medicine

## 2016-10-15 VITALS — BP 132/80 | HR 66 | Ht 67.0 in | Wt 181.0 lb

## 2016-10-15 DIAGNOSIS — M5416 Radiculopathy, lumbar region: Secondary | ICD-10-CM

## 2016-10-15 DIAGNOSIS — M9982 Other biomechanical lesions of thoracic region: Secondary | ICD-10-CM

## 2016-10-15 DIAGNOSIS — M999 Biomechanical lesion, unspecified: Secondary | ICD-10-CM

## 2016-10-15 DIAGNOSIS — M48061 Spinal stenosis, lumbar region without neurogenic claudication: Secondary | ICD-10-CM

## 2016-10-15 NOTE — Progress Notes (Signed)
[name] [address] [place], [place] [address] Phone: [phone] Subjective:    I'm seeing this patient by the request  of:    CC: Lumbar stenosis follow-up  GEX:[id number]  Paul [name] is a 82 y.o. male coming in with complaint of lumbar spinal stenosis and low back pain. Patient has been seen multiple times for this. We discussed icing regimen, home exercises, patient has been very active and continues to play golf on a regular basis. Patient states Some mild back pain overall. Did have a recent injury to left wrist that seems to be getting better. Patient's blood pressure had been high and was started on a diuretic by primary care provider. Making progress with decreasing with systolics in the 120s to 140s now. States that when his blood pressure is in the 120s does have headaches. Midfoot pain on the right side. Not stopping him just more uncomfortable after walking 9 holes.     Past Medical History:  Diagnosis Date  . Cataract   . GERD (gastroesophageal reflux disease)   . Hiatal hernia   . Hypertension    Past Surgical History:  Procedure Laterality Date  . TOTAL KNEE ARTHROPLASTY     left  . TRANSURETHRAL RESECTION OF PROSTATE  2011   Social History   Social History  . Marital status: Legally Separated    Spouse name: N/A  . Number of children: 0  . Years of education: N/A   Occupational History  . retired [employer]    [employer]- teaches science education   Social History Main Topics  . Smoking status: Former Smoker    Quit date: 04/28/1972  . Smokeless tobacco: Never Used  . Alcohol use 3.5 oz/week    7 Standard drinks or equivalent per week     Comment: 1 glass of wine a night  . Drug use: No  . Sexual activity: Yes   Other Topics Concern  . None   Social History Narrative   Health Care POA:    Emergency Contact:    End of Life Plan:    Who lives with you: Lives by himself in 1 story home.    Any pets:  none   Diet: Patient has a varied diet of protein, starch, and vegetables.  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   Exercise: Patient golfs several times a week and does yoga at home 2x week.    Seatbelts: Patient reports wearing seat belt when in vehicle.   [name] Exposure/Protection: Patient reports wearing sun screen intermittently.    Hobbies: golfing, teaching at [employer]         No Known Allergies Family History  Problem Relation Age of Onset  . Diabetes Mother   . Kidney disease Father   . Cancer Sister        ? pancreatic  . Heart disease Brother   . Cancer Sister        ? pancreatic  . Colon cancer Neg Hx   . Stomach cancer Neg Hx   . Rectal cancer Neg Hx   . Esophageal cancer Neg Hx   . Liver cancer Neg Hx     Past medical history, social, surgical and family history all reviewed in electronic medical record.  No pertanent information unless stated regarding to the chief complaint.   Review of Systems: No headache, visual changes, nausea, vomiting, diarrhea, constipation, dizziness, abdominal pain, skin rash, fevers, chills,  night sweats, weight loss, swollen lymph nodes, body aches, joint swelling, chest pain, shortness of breath, mood changes.  Mild muscle aches  Objective  Blood pressure 132/80, pulse 66, height 5\' 7"  (1.702 m), weight 181 lb (82.1 kg), SpO2 98 %. Systems examined below as of 10/15/16   General: No apparent distress alert and oriented x3 mood and affect normal, dressed appropriately.  HEENT: Pupils equal, extraocular movements intact  Respiratory: Patient's speak in full sentences and does not appear short of breath  Cardiovascular: No lower extremity edema, non tender, no erythema  Skin: Warm dry intact with no signs of infection or rash on extremities or on axial skeleton.  Abdomen: Soft nontender  Neuro: Cranial nerves II through XII are intact, neurovascularly intact in all extremities with 2+ DTRs and 2+ pulses.    Lymph: No lymphadenopathy of posterior or anterior cervical chain or axillae bilaterally.  Gait normal with good balance and coordination.  MSK:  Non tender with full range of motion and good stability and symmetric strength and tone of shoulders, elbows, wrist, hip, knee and ankles bilaterally. Severe arthritic changes of multiple joints Left hand exam shows the patient is moderately tender over the ECU area. Right foot exam shows the patient does have pes planus with overpronation of the hindfoot. Severe rigidity of the midfoot  Back Exam:  Inspection: Loss of lordosis Motion: Flexion 30 deg, Extension 25 deg, Side Bending to 25 deg bilaterally,  Rotation to 35 deg bilaterally  SLR laying: Negative  XSLR laying: Negative  Palpable tenderness: Tender to palpation of the paraspinal musculature lumbar spine mostly over the lumbosacral area.Marland Kitchen. FABER: negative. Sensory change: Gross sensation intact to all lumbar and sacral dermatomes.  Reflexes: 2+ at both patellar tendons, 2+ at achilles tendons, Babinski's downgoing.  Strength at foot  Plantar-flexion: 5/5 Dorsi-flexion: 5/5 Eversion: 5/5 Inversion: 5/5  Leg strength  Quad: 5/5 Hamstring: 5/5 Hip flexor: 5/5 Hip abductors: 5/5  Gait unremarkable.  Osteopathic findings C6 flexed rotated and side bent left T3 extended rotated and side bent right inhaled third rib T10 extended rotated and side bent left L1 flexed rotated and side bent right Sacrum right on right     Impression and Recommendations:     This case required medical decision making of moderate complexity.      Note: This dictation was prepared with Dragon dictation along with smaller phrase technology. Any transcriptional errors that result from this process are unintentional.

## 2016-10-15 NOTE — Patient Instructions (Addendum)
As always I am impressed.   Keep doing your thing.  [employer] "total support" online would be great  Be careful in the sun and stay hydrated  For the hand wear the brace daily  See me again in 4-6 weeks.

## 2016-10-15 NOTE — Assessment & Plan Note (Signed)
Seems stable overall. Responding well to osteopathic manipulation. Encourage patient to continue negative core strengthening. Discussed hip abductor strengthening. Follow-up again in 4-6 weeks.

## 2016-10-15 NOTE — Assessment & Plan Note (Signed)
Decision today to treat with OMT was based on Physical Exam  After verbal consent patient was treated with HVLA, ME, FPR techniques in cervical, thoracic, lumbar and sacral areas  Patient tolerated the procedure well with improvement in symptoms  Patient given exercises, stretches and lifestyle modifications  See medications in patient instructions if given  Patient will follow up in 4-6 weeks 

## 2016-10-30 ENCOUNTER — Encounter: Payer: Self-pay | Admitting: Family Medicine

## 2016-10-31 ENCOUNTER — Encounter: Payer: Self-pay | Admitting: Family Medicine

## 2016-11-04 ENCOUNTER — Encounter: Payer: Self-pay | Admitting: Family Medicine

## 2016-11-24 NOTE — Progress Notes (Signed)
[name] D[facility] [address] [place], [place] [address] Phone: [phone] Subjective:    I'm seeing this patient by the request  of:    CC: Lumbar stenosis follow-up  KGM:[id number]:Subjective  Paul RoanDavid [name] is a 82 y.o. male coming in with complaint of lumbar spinal stenosis and low back pain. Hasn't very well with conservative therapy. Patient has done icing regimen, home exercises, and has stayed very dizzy. Not having any radicular pain but has noticed some increasing tightness of the lower back. Patient states going out of the country to playing golf. Patient wants to make sure he is feeling better. Has responded to prednisone in the passive needed.   Was having pain over the ECU on the wrist as well. Seems to improved at this time.     Past Medical History:  Diagnosis Date  . Cataract   . GERD (gastroesophageal reflux disease)   . Hiatal hernia   . Hypertension    Past Surgical History:  Procedure Laterality Date  . TOTAL KNEE ARTHROPLASTY     left  . TRANSURETHRAL RESECTION OF PROSTATE  2011   Social History   Social History  . Marital status: Legally Separated    Spouse name: N/A  . Number of children: 0  . Years of education: N/A   Occupational History  . 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 Smoking status: Former Smoker    Quit date: 04/28/1972  . Smokeless tobacco: Never Used  . Alcohol use 3.5 oz/week    7 Standard drinks or equivalent per week     Comment: 1 glass of wine a night  . Drug use: No  . Sexual activity: Yes   Other Topics Concern  . None   Social History Narrative   Health Care POA:    Emergency Contact:    End of Life Plan:    Who lives with you: Lives by himself in 1 story home.    Any pets: none   Diet: Patient has a varied diet of protein, starch, and vegetables.  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   Exercise: Patient golfs  several times a week and does yoga at home 2x week.    Seatbelts: Patient reports wearing seat belt when in vehicle.   Wynelle LinkSun Exposure/Protection: Patient reports wearing sun screen intermittently.    Hobbies: golfing, teaching at [employer]         No Known Allergies Family History  Problem Relation Age of Onset  . Diabetes Mother   . Kidney disease Father   . Cancer Sister        ? pancreatic  . Heart disease Brother   . Cancer Sister        ? pancreatic  . Colon cancer Neg Hx   . Stomach cancer Neg Hx   . Rectal cancer Neg Hx   . Esophageal cancer Neg Hx   . Liver cancer Neg Hx     Past medical history, social, surgical and family history all reviewed in electronic medical record.  No pertanent information unless stated regarding to the chief complaint.   Review of Systems: No headache, visual changes, nausea, vomiting, diarrhea, constipation, dizziness, abdominal pain, skin rash, fevers, chills, night sweats, weight loss, swollen lymph nodes, body aches, joint swelling, muscle aches, chest pain, shortness of breath, mood changes.    Objective  Blood pressure  124/70, pulse 61, height 5\' 7"  (1.702 m), weight 183 lb (83 kg), SpO2 99 %.   Systems examined below as of 11/26/16 General: NAD A&O x3 mood, affect normal  HEENT: Pupils equal, extraocular movements intact no nystagmus Respiratory: not short of breath at rest or with speaking Cardiovascular: No lower extremity edema, non tender Skin: Warm dry intact with no signs of infection or rash on extremities or on axial skeleton. Abdomen: Soft nontender, no masses Neuro: Cranial nerves  intact, neurovascularly intact in all extremities with 2+ DTRs and 2+ pulses. Lymph: No lymphadenopathy appreciated today  Gait normal with good balance and coordination.  MSK: Non tender with full range of motion and good stability and symmetric strength and tone of shoulders, elbows, wrist,  knee hips and ankles bilaterally.  Arthritic  changes of multiple joints  Back Exam:  Inspection: Loss of lordosis Motion: Flexion 45 deg, Extension 25 deg, Side Bending to 45 deg bilaterally,  Rotation to 45 deg bilaterally  SLR laying: Negative  XSLR laying: Negative  Palpable tenderness: Tender to palpation in the paraspinal musculature mostly of the lumbosacral area. FABER: Tightness bilaterally. Sensory change: Gross sensation intact to all lumbar and sacral dermatomes.  Reflexes: 2+ at both patellar tendons, 2+ at achilles tendons, Babinski's downgoing.  Strength at foot  Plantar-flexion: 5/5 Dorsi-flexion: 5/5 Eversion: 5/5 Inversion: 5/5  Leg strength  Quad: 5/5 Hamstring: 5/5 Hip flexor: 5/5 Hip abductors: 4+/5 but symmetric Gait unremarkable.   Osteopathic findings C2 flexed rotated and side bent right C4 flexed rotated and side bent left C6 flexed rotated and side bent left T3 extended rotated and side bent right inhaled third rib T9 extended rotated and side bent left L2 flexed rotated and side bent right Sacrum right on right'      Impression and Recommendations:     This case required medical decision making of moderate complexity.      Note: This dictation was prepared with Dragon dictation along with smaller phrase technology. Any transcriptional errors that result from this process are unintentional.

## 2016-11-26 ENCOUNTER — Ambulatory Visit (INDEPENDENT_AMBULATORY_CARE_PROVIDER_SITE_OTHER): Payer: Medicare Other | Admitting: Family Medicine

## 2016-11-26 ENCOUNTER — Encounter: Payer: Self-pay | Admitting: Family Medicine

## 2016-11-26 VITALS — BP 124/70 | HR 61 | Ht 67.0 in | Wt 183.0 lb

## 2016-11-26 DIAGNOSIS — M999 Biomechanical lesion, unspecified: Secondary | ICD-10-CM

## 2016-11-26 DIAGNOSIS — M48061 Spinal stenosis, lumbar region without neurogenic claudication: Secondary | ICD-10-CM

## 2016-11-26 DIAGNOSIS — M5416 Radiculopathy, lumbar region: Secondary | ICD-10-CM

## 2016-11-26 MED ORDER — PREDNISONE 50 MG PO TABS: 50.0000 mg | ORAL_TABLET | Freq: Every day | ORAL | 0 refills | Status: DC

## 2016-11-26 NOTE — Patient Instructions (Addendum)
Good to see you  Paul [name] is your friend.  Prednisone daily for 5 day if really bad 20-3830mmhg of pressure compression socks.  See me again in 5 weeks

## 2016-11-26 NOTE — Assessment & Plan Note (Signed)
Decision today to treat with OMT was based on Physical Exam  After verbal consent patient was treated with HVLA, ME, FPR techniques in cervical, thoracic, lumbar and sacral areas  Patient tolerated the procedure well with improvement in symptoms  Patient given exercises, stretches and lifestyle modifications  See medications in patient instructions if given  Patient will follow up in 4-6 weeks 

## 2016-11-26 NOTE — Assessment & Plan Note (Signed)
Patient is doing relatively well. Will be out of the country for 4 weeks. Prednisone given encased patient has a flare. We discussed icing regimen, home exercise, which activities to do in which ones to avoid. Patient is other medications including muscle relaxers for breaks or pain. Patient did well continue the exercises. Follow-up with me again in 5-6 weeks and patient returns.

## 2016-12-09 ENCOUNTER — Ambulatory Visit (INDEPENDENT_AMBULATORY_CARE_PROVIDER_SITE_OTHER): Payer: Medicare Other | Admitting: Family Medicine

## 2016-12-09 ENCOUNTER — Encounter: Payer: Self-pay | Admitting: Family Medicine

## 2016-12-09 VITALS — BP 128/70 | HR 63 | Temp 98.7°F | Wt 183.0 lb

## 2016-12-09 DIAGNOSIS — G44201 Tension-type headache, unspecified, intractable: Secondary | ICD-10-CM

## 2016-12-09 MED ORDER — BACLOFEN 10 MG PO TABS: 10.0000 mg | ORAL_TABLET | Freq: Three times a day (TID) | ORAL | 0 refills | Status: AC

## 2016-12-09 MED ORDER — BACLOFEN 10 MG PO TABS: 10.0000 mg | ORAL_TABLET | Freq: Three times a day (TID) | ORAL | 0 refills | Status: DC

## 2016-12-09 MED ORDER — ACETAMINOPHEN 500 MG PO TABS: 500.0000 mg | ORAL_TABLET | Freq: Four times a day (QID) | ORAL | 0 refills | Status: DC | PRN

## 2016-12-09 NOTE — Patient Instructions (Addendum)
It was great seeing you today! We have addressed the following issues today  1. I will prescribe baclofen a muscle relaxer to help with the tightness in your neck. Please take 5 mg three times a day. You can also use tylenol as needed for headaches. 2. If you continue to have headaches despite current treatment or notice any neurologic changes or develop fever make sure you are seen in the ED.  If we did any lab work today, and the results require attention, either me or my nurse will get in touch with you. If everything is normal, you will get a letter in mail and a message via . If you don't hear from [place] in two weeks, please give [place] a call. Otherwise, we look forward to seeing you again at your next visit. If you have any questions or concerns before then, please call the clinic at [phone].  Please bring all your medications to every doctors visit  Sign up for My Chart to have easy access to your labs results, and communication with your Primary care physician. Please ask Front Desk for some assistance.   Please check-out at the front desk before leaving the clinic.    Take Care,   Dr. Sydnee Cabal   Tension Headache A tension headache is a feeling of pain, pressure, or aching that is often felt over the front and sides of the head. The pain can be dull, or it can feel tight (constricting). Tension headaches are not normally associated with nausea or vomiting, and they do not get worse with physical activity. Tension headaches can last from 30 minutes to several days. This is the most common type of headache. CAUSES The exact cause of this condition is not known. Tension headaches often begin after stress, anxiety, or depression. Other triggers may include:  Alcohol.  Too much caffeine, or caffeine withdrawal.  Respiratory infections, such as colds, flu, or sinus infections.  Dental problems or teeth clenching.  Fatigue.  Holding your head and neck in the same position for a  long period of time, such as while using a computer.  Smoking. SYMPTOMS Symptoms of this condition include:  A feeling of pressure around the head.  Dull, aching head pain.  Pain felt over the front and sides of the head.  Tenderness in the muscles of the head, neck, and shoulders. DIAGNOSIS This condition may be diagnosed based on your symptoms and a physical exam. Tests may be done, such as a CT scan or an MRI of your head. These tests may be done if your symptoms are severe or unusual. TREATMENT This condition may be treated with lifestyle changes and medicines to help relieve symptoms. HOME CARE INSTRUCTIONS Managing Pain  Take over-the-counter and prescription medicines only as told by your health care provider.  Lie down in a dark, quiet room when you have a headache.  If directed, apply ice to the head and neck area: ? Put ice in a plastic bag. ? Place a towel between your skin and the bag. ? Leave the ice on for 20 minutes, 2-3 times per day.  Use a heating pad or a hot shower to apply heat to the head and neck area as told by your health care provider. Eating and Drinking  Eat meals on a regular schedule.  Limit alcohol use.  Decrease your caffeine intake, or stop using caffeine. General Instructions  Keep all follow-up visits as told by your health care provider. This is important.  Keep  a headache journal to help find out what may trigger your headaches. For example, write down: ? What you eat and drink. ? How much sleep you get. ? Any change to your diet or medicines.  Try massage or other relaxation techniques.  Limit stress.  Sit up straight, and avoid tensing your muscles.  Do not use tobacco products, including cigarettes, chewing tobacco, or e-cigarettes. If you need help quitting, ask your health care provider.  Exercise regularly as told by your health care provider.  Get 7-9 hours of sleep, or the amount recommended by your health care  provider. SEEK MEDICAL CARE IF:  Your symptoms are not helped by medicine.  You have a headache that is different from what you normally experience.  You have nausea or you vomit.  You have a fever. SEEK IMMEDIATE MEDICAL CARE IF:  Your headache becomes severe.  You have repeated vomiting.  You have a stiff neck.  You have a loss of vision.  You have problems with speech.  You have pain in your eye or ear.  You have muscular weakness or loss of muscle control.  You lose your balance or you have trouble walking.  You feel faint or you pass out.  You have confusion. This information is not intended to replace advice given to you by your health care provider. Make sure you discuss any questions you have with your health care provider. Document Released: 04/14/2005 Document Revised: 01/03/2015 Document Reviewed: 08/07/2014 Elsevier Interactive Patient Education  2017 [employer]

## 2016-12-10 NOTE — Progress Notes (Signed)
   Subjective:    Patient ID: Paul [name], male    DOB: 06-Apr-1935, [age] y.o.   MRN: [id number]   CC: Headaches  HPI: Patient is  82 yo male who present today for headaches for the past three days. Patient reports intermittent "superficial"/scalp headaches for the past three days. Patient has not tried any medication for his headaches, denies any similar prior history. Patient also reports some tenderness at the back of his neck. Patient has been working out regularly and have been doing a lot of overhead exercises with weight. Patient denies any fever, nausea, vomiting, chills, vision or speech changes, no gait change or other neurologic changes. Patient [place] and want to make sure he is feeling better before traveling.  Smoking status reviewed   ROS: all other systems were reviewed and are negative other than in the HPI   Past Medical History:  Diagnosis Date  . Cataract   . GERD (gastroesophageal reflux disease)   . Hiatal hernia   . Hypertension     Past Surgical History:  Procedure Laterality Date  . TOTAL KNEE ARTHROPLASTY     left  . TRANSURETHRAL RESECTION OF PROSTATE  2011    Past medical history, surgical, family, and social history reviewed and updated in the EMR as appropriate.  Objective:  BP 128/70   Pulse 63   Temp 98.7 F (37.1 C) (Oral)   Wt 183 lb (83 kg)   SpO2 99%   BMI 28.66 kg/m   Vitals and nursing note reviewed  General: NAD, pleasant, able to participate in exam HEENT: Cervical tenderness on palpation in the SCM/trapezius distribution, no difficulty with neck flexion and extension Cardiac: RRR, normal heart sounds, no murmurs. 2+ radial and PT pulses bilaterally Respiratory: CTAB, normal effort, No wheezes, rales or rhonchi Abdomen: soft, nontender, nondistended, no hepatic or splenomegaly, +BS Extremities: no edema or cyanosis. WWP. Skin: warm and dry, no rashes noted Neuro: alert and oriented x4, cranial nerves  II-XII are intact, strength UE and LE+5, sensation intact bilaterally, normal gait Psych: Normal affect and mood   Assessment & Plan:   #Headaches, acute Patient presents with headaches for 3 days and some neck muscles tightness without any alarming signs/ symptoms concerning for CNS infection such as meningitis. Neurologic exam is within normal limits. Headaches appears to be secondary to tension in his neck and shoulders. Patient is 5679 and continue left weight and exercise frequently. Patient may have over done it in his exercise routine. Will treat conservatively, return instructions given to patient in case symptoms to nnot improve or worsen in the next 24 to 48 hrs --Tylenol 500 mg as needed --Baclofen 5 mg tid for 3 days --Follow up as needed     Lovena NeighboursAbdoulaye Kinser Fellman, MD [facility] PGY-2

## 2017-01-01 ENCOUNTER — Encounter: Payer: Self-pay | Admitting: Family Medicine

## 2017-01-01 ENCOUNTER — Ambulatory Visit (INDEPENDENT_AMBULATORY_CARE_PROVIDER_SITE_OTHER): Payer: Medicare Other | Admitting: Family Medicine

## 2017-01-01 VITALS — BP 142/82 | HR 61 | Ht 67.0 in | Wt 180.0 lb

## 2017-01-01 DIAGNOSIS — M9982 Other biomechanical lesions of thoracic region: Secondary | ICD-10-CM | POA: Diagnosis not present

## 2017-01-01 DIAGNOSIS — M5416 Radiculopathy, lumbar region: Secondary | ICD-10-CM

## 2017-01-01 DIAGNOSIS — M999 Biomechanical lesion, unspecified: Secondary | ICD-10-CM

## 2017-01-01 DIAGNOSIS — M48061 Spinal stenosis, lumbar region without neurogenic claudication: Secondary | ICD-10-CM

## 2017-01-01 MED ORDER — DOXYCYCLINE HYCLATE 100 MG PO TABS: 100.0000 mg | ORAL_TABLET | Freq: Two times a day (BID) | ORAL | 0 refills | Status: AC

## 2017-01-01 NOTE — Assessment & Plan Note (Signed)
Stable overall. We will continue to monitor. We discussed icing regimen and home exercises. We discussed which activities doing which was to avoid. Increase activity as tolerated. Follow-up again with me 6 weeks. No change in medications

## 2017-01-01 NOTE — Assessment & Plan Note (Signed)
Decision today to treat with OMT was based on Physical Exam  After verbal consent patient was treated with HVLA, ME, FPR techniques in cervical, thoracic, lumbar and sacral areas  Patient tolerated the procedure well with improvement in symptoms  Patient given exercises, stretches and lifestyle modifications  See medications in patient instructions if given  Patient will follow up in 6-8 weeks 

## 2017-01-01 NOTE — Progress Notes (Signed)
Tawana ScaleZach Kaydra Borgen D.O. [facility] [address] [place], [place] [address] Phone: [phone] Subjective:     CC: Lumbar stenosis follow-up  NWG:[id number]:Subjective  Paul RoanDavid [name] is a 82 y.o. male coming in with complaint of lumbar spinal stenosis and low back pain. Patient continues to be very active.  Was having pain over the ECU on the wrist as well. Seems to improved at this time.     Past Medical History:  Diagnosis Date  . Cataract   . GERD (gastroesophageal reflux disease)   . Hiatal hernia   . Hypertension    Past Surgical History:  Procedure Laterality Date  . TOTAL KNEE ARTHROPLASTY     left  . TRANSURETHRAL RESECTION OF PROSTATE  2011   Social History   Social History  . Marital status: Legally Separated    Spouse name: N/A  . Number of children: 0  . Years of education: N/A   Occupational History  . 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 Smoking status: Former Smoker    Quit date: 04/28/1972  . Smokeless tobacco: Never Used  . Alcohol use 3.5 oz/week    7 Standard drinks or equivalent per week     Comment: 1 glass of wine a night  . Drug use: No  . Sexual activity: Yes   Other Topics Concern  . Not on file   Social History Narrative   Health Care POA:    Emergency Contact:    End of Life Plan:    Who lives with you: Lives by himself in 1 story home.    Any pets: none   Diet: Patient has a varied diet of protein, starch, and vegetables.  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   Exercise: Patient golfs several times a week and does yoga at home 2x week.    Seatbelts: Patient reports wearing seat belt when in vehicle.   Wynelle LinkSun Exposure/Protection: Patient reports wearing sun screen intermittently.    Hobbies: golfing, teaching at [employer]         No Known Allergies Family History  Problem Relation Age of Onset  . Diabetes Mother   . Kidney disease  Father   . Cancer Sister        ? pancreatic  . Heart disease Brother   . Cancer Sister        ? pancreatic  . Colon cancer Neg Hx   . Stomach cancer Neg Hx   . Rectal cancer Neg Hx   . Esophageal cancer Neg Hx   . Liver cancer Neg Hx     Past medical history, social, surgical and family history all reviewed in electronic medical record.  No pertanent information unless stated regarding to the chief complaint.   Review of Systems: No headache, visual changes, nausea, vomiting, diarrhea, constipation, dizziness, abdominal pain, skin rash, fevers, chills, night sweats, weight loss, swollen lymph nodes, body aches, joint swelling, muscle aches, chest pain, shortness of breath, mood changes.     Objective  There were no vitals taken for this visit.   Systems examined below as of 01/01/17 General: NAD A&O x3 mood, affect normal  HEENT: Pupils equal, extraocular movements intact no nystagmus Respiratory: not short of breath at rest or with speaking Cardiovascular: No lower extremity edema, non tender Skin: Warm dry intact with no signs of infection or rash on extremities or on axial  skeleton. Abdomen: Soft nontender, no masses Neuro: Cranial nerves  intact, neurovascularly intact in all extremities with 2+ DTRs and 2+ pulses. Lymph: No lymphadenopathy appreciated today  Gait normal with good balance and coordination.  MSK: Non tender with full range of motion and good stability and symmetric strength and tone of shoulders, elbows, wrist,  knee hips and ankles bilaterally.  Arthritic changes of multiple joints  Back Exam:  Inspection: Loss of lordosis Motion: Flexion 40 deg, Extension 25 deg, Side Bending to 40 deg bilaterally,  Rotation to 45 deg bilaterally  SLR laying: Negative  XSLR laying: Negative  Palpable tenderness: Tender to palpation in the paraspinal musculature mostly of the lumbosacral area. FABER: Tightness bilaterally. Sensory change: Gross sensation intact to all  lumbar and sacral dermatomes.  Reflexes: 2+ at both patellar tendons, 2+ at achilles tendons, Babinski's downgoing.  Strength at foot  Plantar-flexion: 5/5 Dorsi-flexion: 5/5 Eversion: 5/5 Inversion: 5/5  Leg strength  Quad: 5/5 Hamstring: 5/5 Hip flexor: 5/5 Hip abductors: 4+5 and symmetric  Gait unremarkable.  Osteopathic findings C2 flexed rotated and side bent right C4 flexed rotated and side bent left C7 flexed rotated and side bent left T3 extended rotated and side bent right inhaled third rib T11 extended rotated and side bent left L3 flexed rotated and side bent right Sacrum right on right           Impression and Recommendations:     This case required medical decision making of moderate complexity.      Note: This dictation was prepared with Dragon dictation along with smaller phrase technology. Any transcriptional errors that result from this process are unintentional.

## 2017-01-01 NOTE — Patient Instructions (Signed)
Good to see you  Doxycycline for next 10 days  Get back your routine New balance 990 or 1080 could be great  See me again in 4-6 weeks

## 2017-01-03 ENCOUNTER — Encounter: Payer: Self-pay | Admitting: Family Medicine

## 2017-01-08 ENCOUNTER — Other Ambulatory Visit: Payer: Self-pay | Admitting: Family Medicine

## 2017-01-08 ENCOUNTER — Other Ambulatory Visit: Payer: Self-pay | Admitting: Internal Medicine

## 2017-01-14 ENCOUNTER — Telehealth: Payer: Self-pay | Admitting: Internal Medicine

## 2017-01-14 MED ORDER — RANITIDINE HCL 150 MG PO TABS: 150.0000 mg | ORAL_TABLET | Freq: Two times a day (BID) | ORAL | 1 refills | Status: DC

## 2017-01-14 NOTE — Telephone Encounter (Signed)
Rx sent 

## 2017-01-29 ENCOUNTER — Ambulatory Visit: Payer: Medicare Other | Admitting: Family Medicine

## 2017-03-05 ENCOUNTER — Encounter: Payer: Self-pay | Admitting: Family Medicine

## 2017-03-05 ENCOUNTER — Ambulatory Visit: Payer: Medicare Other | Admitting: Family Medicine

## 2017-03-05 VITALS — BP 138/82 | HR 62 | Ht 69.0 in | Wt 187.0 lb

## 2017-03-05 DIAGNOSIS — M999 Biomechanical lesion, unspecified: Secondary | ICD-10-CM

## 2017-03-05 DIAGNOSIS — M9982 Other biomechanical lesions of thoracic region: Secondary | ICD-10-CM

## 2017-03-05 DIAGNOSIS — M5416 Radiculopathy, lumbar region: Secondary | ICD-10-CM

## 2017-03-05 DIAGNOSIS — M48061 Spinal stenosis, lumbar region without neurogenic claudication: Secondary | ICD-10-CM

## 2017-03-05 NOTE — Assessment & Plan Note (Signed)
Patient is having worsening symptoms.  More radicular symptoms.  Significant tightness.  Declined any type of epidural at this time.  We discussed icing regimen, home exercise, which activities are doing which wants to avoid.  Patient will slowly increase activity as tolerated.  Follow-up with me again in 4-6 weeks.

## 2017-03-05 NOTE — Assessment & Plan Note (Signed)
Decision today to treat with OMT was based on Physical Exam  After verbal consent patient was treated with HVLA, ME, FPR techniques in cervical, thoracic, lumbar and sacral areas  Patient tolerated the procedure well with improvement in symptoms  Patient given exercises, stretches and lifestyle modifications  See medications in patient instructions if given  Patient will follow up in 4 weeks 

## 2017-03-05 NOTE — Progress Notes (Signed)
Paul [name] D.O. [facility] [address] [place], [place] [address] Phone: [phone] Subjective:    I'm seeing this patient by the request  of:    CC: Back pain follow-up  BJY:[id number]:Subjective  Paul RoanDavid [name] is a 82 y.o. male coming in for follow up for back pain. He said his back is tight since he missed his last appointment.  Patient does have a past medical history significant for spinal stenosis.  Patient states some mild increase in radiation going down.  Patient is doing more walking and less biking recent.       Past Medical History:  Diagnosis Date  . Cataract   . GERD (gastroesophageal reflux disease)   . Hiatal hernia   . Hypertension    Past Surgical History:  Procedure Laterality Date  . TOTAL KNEE ARTHROPLASTY     left  . TRANSURETHRAL RESECTION OF PROSTATE  2011   Social History   Socioeconomic History  . Marital status: Legally Separated    Spouse name: None  . Number of children: 0  . Years of education: None  . Highest education level: None  Social Needs  . Financial resource strain: None  . Food insecurity - worry: None  . Food insecurity - inability: None  . Transportation needs - medical: None  . Transportation needs - non-medical: None  Occupational History  . 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 Smoking status: Former Smoker    Last attempt to quit: 04/28/1972    Years since quitting: 44.8  . Smokeless tobacco: Never Used  Substance and Sexual Activity  . Alcohol use: Yes    Alcohol/week: 3.5 oz    Types: 7 Standard drinks or equivalent per week    Comment: 1 glass of wine a night  . Drug use: No  . Sexual activity: Yes  Other Topics Concern  . None  Social History Narrative   Health Care POA:    Emergency Contact:    End of Life Plan:    Who lives with you: Lives by himself in 1 story home.    Any pets: none   Diet: Patient has a varied  diet of protein, starch, and vegetables.  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   Exercise: Patient golfs several times a week and does yoga at home 2x week.    Seatbelts: Patient reports wearing seat belt when in vehicle.   Wynelle LinkSun Exposure/Protection: Patient reports wearing sun screen intermittently.    Hobbies: golfing, teaching at [employer]      No Known Allergies Family History  Problem Relation Age of Onset  . Diabetes Mother   . Kidney disease Father   . Cancer Sister        ? pancreatic  . Heart disease Brother   . Cancer Sister        ? pancreatic  . Colon cancer Neg Hx   . Stomach cancer Neg Hx   . Rectal cancer Neg Hx   . Esophageal cancer Neg Hx   . Liver cancer Neg Hx     Past medical history, social, surgical and family history all reviewed in electronic medical record.  No pertanent information unless stated regarding to the chief complaint.   Review of Systems:Review of systems updated and as accurate as of 03/05/17  No headache, visual changes, nausea, vomiting, diarrhea, constipation, dizziness, abdominal  pain, skin rash, fevers, chills, night sweats, weight loss, swollen lymph nodes, body aches, joint swelling,  chest pain, shortness of breath, mood changes.  Positive muscle aches  Objective  Blood pressure 138/82, pulse 62, height 5\' 9"  (1.753 m), weight 187 lb (84.8 kg), SpO2 98 %. Systems examined below as of 03/05/17   General: No apparent distress alert and oriented x3 mood and affect normal, dressed appropriately.  HEENT: Pupils equal, extraocular movements intact  Respiratory: Patient's speak in full sentences and does not appear short of breath  Cardiovascular: No lower extremity edema, non tender, no erythema  Skin: Warm dry intact with no signs of infection or rash on extremities or on axial skeleton.  Abdomen: Soft nontender  Neuro: Cranial nerves II through XII are intact, neurovascularly intact in all extremities with  2+ DTRs and 2+ pulses.  Lymph: No lymphadenopathy of posterior or anterior cervical chain or axillae bilaterally.  Gait normal with good balance and coordination.  MSK:  Non tender with full range of motion and good stability and symmetric strength and tone of shoulders, elbows, wrist, hip, knee and ankles bilaterally.  Arthritic changes in multiple joints Back Exam:  Inspection: Unremarkable  Motion: Flexion 35 deg, Extension 15 deg, Side Bending to 45 deg bilaterally,  Rotation to 45 deg bilaterally  SLR laying: Negative significant tightness of the hamstrings bilaterally  XSLR laying: Negative  Palpable tenderness: Severe increased tightness in the paraspinal musculature of the lumbar spine.Marland Kitchen. FABER: Tightness bilaterally. Sensory change: Gross sensation intact to all lumbar and sacral dermatomes.  Reflexes: 2+ at both patellar tendons, 2+ at achilles tendons, Babinski's downgoing.  Strength at foot  Plantar-flexion: 5/5 Dorsi-flexion: 5/5 Eversion: 5/5 Inversion: 5/5  Leg strength  Quad: 5/5 Hamstring: 5/5 Hip flexor: 5/5 Hip abductors: 4/5 but symmetric Gait unremarkable.  Osteopathic findings C2 flexed rotated and side bent right C4 flexed rotated and side bent left C6 flexed rotated and side bent left T3 extended rotated and side bent right inhaled third rib T9 extended rotated and side bent left L2 flexed rotated and side bent right L4 flexed rotated and side bent right Sacrum right on right    Impression and Recommendations:     This case required medical decision making of moderate complexity.      Note: This dictation was prepared with Dragon dictation along with smaller phrase technology. Any transcriptional errors that result from this process are unintentional.

## 2017-03-27 ENCOUNTER — Encounter: Payer: Self-pay | Admitting: Internal Medicine

## 2017-03-27 ENCOUNTER — Ambulatory Visit: Payer: Medicare Other | Admitting: Internal Medicine

## 2017-03-27 VITALS — BP 144/74 | HR 80 | Ht 67.1 in | Wt 185.4 lb

## 2017-03-27 DIAGNOSIS — K2100 Gastro-esophageal reflux disease with esophagitis, without bleeding: Secondary | ICD-10-CM

## 2017-03-27 DIAGNOSIS — K5909 Other constipation: Secondary | ICD-10-CM | POA: Diagnosis not present

## 2017-03-27 MED ORDER — PANTOPRAZOLE SODIUM 40 MG PO TBEC: 40.0000 mg | DELAYED_RELEASE_TABLET | Freq: Every day | ORAL | 1 refills | Status: DC

## 2017-03-27 MED ORDER — RANITIDINE HCL 150 MG PO TABS: 150.0000 mg | ORAL_TABLET | Freq: Every day | ORAL | 1 refills | Status: DC

## 2017-03-27 NOTE — Progress Notes (Signed)
   Subjective:    Patient ID: Paul RoanDavid [name], male    DOB: [birth year], 82 y.o.   MRN: [id number]  HPI Paul [name] is an 82 year old male with a history of GERD with esophagitis who is here for follow-up.  He was last seen on 09/10/2016.  He has a history of hypertension, gout, hyperlipidemia and erectile dysfunction.  He has a history of severe ulcerative esophagitis of the GE junction which was found to be benign by biopsy.  He also has a 3 cm hiatal hernia.  He has been maintained on pantoprazole 40 mg once daily and Zantac 150 mg at bedtime.  He says for the most part his reflux and heartburn have been well controlled, particularly throughout the day but occasionally will flare in the late evening.  Intact seems to alleviate the symptoms for the most part.  He does have an adjustable bed and will move the bed to the "snoring position" if reflux is bothering him.  He denies all dysphagia and odynophagia.  No nausea or vomiting.  Occasionally he will have intermittent burning epigastric pain which is often postprandial.  He does not associate this with reflux.  This is quite rare and he feels may be dietary related.  He cannot identify specific food trigger.  Bowel movements have been regular with MiraLAX 17 g daily.  No blood in his stool or melena.  Review of Systems As per HPI, otherwise negative  Current Medications, Allergies, Past Medical History, Past Surgical History, Family History and Social History were reviewed in [facility] electronic medical record.     Objective:   Physical Exam BP (!) 144/74   Pulse 80   Ht 5' 7.1" (1.704 m)   Wt 185 lb 6.4 oz (84.1 kg)   BMI 28.95 kg/m  Constitutional: Well-developed and well-nourished. No distress. HEENT: Normocephalic and atraumatic.   No scleral icterus. Neck: Neck supple. Trachea midline. Cardiovascular: Normal rate, regular rhythm and intact distal pulses. No M/R/G Pulmonary/chest: Effort normal and breath sounds normal. No  wheezing, rales or rhonchi. Abdominal: Soft, nontender, nondistended. Bowel sounds active throughout.  Extremities: no clubbing, cyanosis, or edema Neurological: Alert and oriented to person place and time. Skin: Skin is warm and dry. Psychiatric: Normal mood and affect. Behavior is normal.      Assessment & Plan:  82 year old male with a history of GERD with esophagitis who is here for follow-up.  He was last seen on 09/10/2016   1.  GERD with esophagitis --some breakthrough symptoms in the evening particularly when drinking wine.  We discussed that one is a definitive reflux trigger.  We discussed twice daily PPI versus moving his ranitidine to closer to dinner to cover him more fully in the evening.  He would like to avoid twice daily PPI if possible given his mild renal insufficiency.  Thus we will continue pantoprazole 40 mg once daily in the morning and ranitidine 150 mg in the evening at dinnertime.  He is asked to notify me if he develops uncontrolled heartburn or any evidence of dysphagia.  We again discussed GERD diet and triggers.  Need to elevate the head of his bed should be helpful.  2.  CRC screening --up-to-date with negative Cologuard in 2018  3.  Mild constipation --continue MiraLAX 17 g daily which is working well.  He prefers to follow-up in about 6 month 15 minutes spent with the patient today. Greater than 50% was spent in counseling and coordination of care with the patient

## 2017-03-27 NOTE — Patient Instructions (Signed)
We have sent the following medications to your pharmacy for you to pick up at your convenience: Pantoprazole every morning Zantac after dinner (rather than before bedtime)  If you are age 80 or older, your body mass index should be between 23-30. Your Body mass index is 28.95 kg/m. If this is out of the aforementioned range listed, please consider follow up with your Primary Care Provider.  If you are age 80 or younger, your body mass index should be between 19-25. Your Body mass index is 28.95 kg/m. If this is out of the aformentioned range listed, please consider follow up with your Primary Care Provider.

## 2017-04-01 NOTE — Progress Notes (Signed)
[facility] [place], [place] [address] Phone: [phone] Subjective:     CC: Back pain follow-up  [id number]  Paul [name] is a 82 y.o. male coming in with complaint of back pain.  Patient has known lumbar spinal stenosis.  Patient is doing relatively well with conservative therapy.  Takes medications occasionally and has had a couple epidurals over the course of several years.  Patient has responded very well to osteopathic manipulation.  Patient states overall doing relatively well.  Patient was playing even golf the other day.  No significant radiation of the legs.  As long as patient does his tai chi and exercises seems to do well.      Past Medical History:  Diagnosis Date  . Cataract   . GERD (gastroesophageal reflux disease)   . Hiatal hernia   . Hypertension    Past Surgical History:  Procedure Laterality Date  . TOTAL KNEE ARTHROPLASTY     left  . TRANSURETHRAL RESECTION OF PROSTATE  2011   Social History   Socioeconomic History  . Marital status: Legally Separated    Spouse name: Not on file  . Number of children: 0  . Years of education: Not on file  . Highest education level: Not on file  Social Needs  . Financial resource strain: Not on file  . Food insecurity - worry: Not on file  . Food insecurity - inability: Not on file  . Transportation needs - medical: Not on file  . Transportation needs - non-medical: Not on file  Occupational History  . 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 Smoking status: Former Smoker    Last attempt to quit: 04/28/1972    Years since quitting: 44.9  . Smokeless tobacco: Never Used  Substance and Sexual Activity  . Alcohol use: Yes    Alcohol/week: 3.5 oz    Types: 7 Standard drinks or equivalent per week    Comment: 1 glass of wine a night  . Drug use: No  . Sexual activity: Yes  Other Topics  Concern  . Not on file  Social History Narrative   Health Care POA:    Emergency Contact:    End of Life Plan:    Who lives with you: Lives by himself in 1 story home.    Any pets: none   Diet: Patient has a varied diet of protein, starch, and vegetables.  Is currently working with [employer] for nutrition.   Exercise: Patient golfs several times a week and does yoga at home 2x week.    Seatbelts: Patient reports wearing seat belt when in vehicle.   Nancy Fetter Exposure/Protection: Patient reports wearing sun screen intermittently.    Hobbies: golfing, teaching at [employer]      No Known Allergies Family History  Problem Relation Age of Onset  . Diabetes Mother   . Kidney disease Father   . Cancer Sister        ? pancreatic  . Heart disease Brother   . Cancer Sister        ? pancreatic  . Colon cancer Neg Hx   . Stomach cancer Neg Hx   . Rectal cancer Neg Hx   . Esophageal cancer Neg Hx   . Liver cancer Neg Hx      Past medical history, social, surgical and family history all  reviewed in electronic medical record.  No pertanent information unless stated regarding to the chief complaint.   Review of Systems:Review of systems updated and as accurate as of 04/01/17  No headache, visual changes, nausea, vomiting, diarrhea, constipation, dizziness, abdominal pain, skin rash, fevers, chills, night sweats, weight loss, swollen lymph nodes, body aches, joint swelling, muscle aches, chest pain, shortness of breath, mood changes.   Objective  There were no vitals taken for this visit. Systems examined below as of 04/01/17   General: No apparent distress alert and oriented x3 mood and affect normal, dressed appropriately.  HEENT: Pupils equal, extraocular movements intact  Respiratory: Patient's speak in full sentences and does not appear short of breath  Cardiovascular: No lower extremity edema, non tender, no erythema  Skin: Warm dry intact with no signs of infection or  rash on extremities or on axial skeleton.  Abdomen: Soft nontender  Neuro: Cranial nerves II through XII are intact, neurovascularly intact in all extremities with 2+ DTRs and 2+ pulses.  Lymph: No lymphadenopathy of posterior or anterior cervical chain or axillae bilaterally.  Gait normal with good balance and coordination.  MSK:  Non tender with full range of motion and good stability and symmetric strength and tone of shoulders, elbows, wrist, hip, knee and ankles bilaterally.  Arthritic changes in multiple joints  Back Exam:  Inspection: Loss of lordosis with degenerative scoliosis Motion: Flexion 45 deg, Extension 15 deg, Side Bending to 35 deg bilaterally,  Rotation to 30deg bilaterally  SLR laying: Negative  XSLR laying: Negative  Palpable tenderness: Tender to palpation of the paraspinal musculature in the lumbar spine. FABER: Significant tightness bilaterally. Sensory change: Gross sensation intact to all lumbar and sacral dermatomes.  Reflexes: 2+ at both patellar tendons, 2+ at achilles tendons, Babinski's downgoing.  Strength at foot  Plantar-flexion: 5/5 Dorsi-flexion: 5/5 Eversion: 5/5 Inversion: 5/5  Leg strength  Quad: 5/5 Hamstring: 5/5 Hip flexor: 5/5 Hip abductors: 5/5  Gait unremarkable.  Osteopathic findings C7 flexed rotated and side bent left T3 extended rotated and side bent right inhaled third rib T6 extended rotated and side bent left L3 flexed rotated and side bent right Sacrum right on right     Impression and Recommendations:     This case required medical decision making of moderate complexity.      Note: This dictation was prepared with Dragon dictation along with smaller phrase technology. Any transcriptional errors that result from this process are unintentional.

## 2017-04-02 ENCOUNTER — Ambulatory Visit: Payer: Medicare Other | Admitting: Family Medicine

## 2017-04-02 ENCOUNTER — Encounter: Payer: Self-pay | Admitting: Family Medicine

## 2017-04-02 VITALS — BP 130/70 | HR 60 | Ht 69.0 in | Wt 181.0 lb

## 2017-04-02 DIAGNOSIS — M999 Biomechanical lesion, unspecified: Secondary | ICD-10-CM

## 2017-04-02 DIAGNOSIS — M9982 Other biomechanical lesions of thoracic region: Secondary | ICD-10-CM

## 2017-04-02 DIAGNOSIS — M5416 Radiculopathy, lumbar region: Secondary | ICD-10-CM

## 2017-04-02 DIAGNOSIS — M48061 Spinal stenosis, lumbar region without neurogenic claudication: Secondary | ICD-10-CM

## 2017-04-02 NOTE — Assessment & Plan Note (Signed)
Patient does have degenerative scoliosis with spinal stenosis.  Has been doing relatively well.  No significant change in management.  Follow-up again in 4-6 weeks

## 2017-04-02 NOTE — Patient Instructions (Signed)
Good to see you  Ice is your friend Overall not bad Watch the stomach and keep me updated  See me again in 4 weeks Happy holidays!  Happy New Year!

## 2017-04-02 NOTE — Assessment & Plan Note (Signed)
Decision today to treat with OMT was based on Physical Exam  After verbal consent patient was treated with HVLA, ME, FPR techniques in cervical, thoracic, lumbar and sacral areas  Patient tolerated the procedure well with improvement in symptoms  Patient given exercises, stretches and lifestyle modifications  See medications in patient instructions if given  Patient will follow up in 4 weeks 

## 2017-04-16 ENCOUNTER — Other Ambulatory Visit: Payer: Self-pay | Admitting: Family Medicine

## 2017-04-16 MED ORDER — LISINOPRIL 40 MG PO TABS: 40.0000 mg | ORAL_TABLET | Freq: Every day | ORAL | 3 refills | Status: DC

## 2017-04-29 NOTE — Progress Notes (Signed)
Tawana ScaleZach [name] D.O. [facility] [address] [place], [place] [address] Phone: [phone] Subjective:    I'm seeing this patient by the request  of:    CC: Low back pain follow-up  BJY:NWGNFAOZHYHPI:Subjective  Paul RoanDavid [name] is a 83 y.o. male coming in with complaint of low back pain follow-up.  Patient does have moderate to severe spinal stenosis of the lumbar spine.  Has responded fairly well to conservative therapy including osteopathic manipulation.  Has had a couple epidurals over the course the last several years.  Patient continues to play golf most days of the week and works out every day and some facet.  Patient states that the radiation down the leg is minimal.  Has noticed some mild stiffness in the morning with a new mattress recently.  Patient denies though any weakness.  States though that in the midday does notice some significant fatigue.       Past Medical History:  Diagnosis Date  . Cataract   . GERD (gastroesophageal reflux disease)   . Hiatal hernia   . Hypertension    Past Surgical History:  Procedure Laterality Date  . TOTAL KNEE ARTHROPLASTY     left  . TRANSURETHRAL RESECTION OF PROSTATE  2011   Social History   Socioeconomic History  . Marital status: Legally Separated    Spouse name: None  . Number of children: 0  . Years of education: None  . Highest education level: None  Social Needs  . Financial resource strain: None  . Food insecurity - worry: None  . Food insecurity - inability: None  . Transportation needs - medical: None  . Transportation needs - non-medical: None  Occupational History  . 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 Smoking status: Former Smoker    Last attempt to quit: 04/28/1972    Years since quitting: 45.0  . Smokeless tobacco: Never Used  Substance and Sexual Activity  . Alcohol use: Yes    Alcohol/week: 3.5 oz    Types: 7 Standard drinks  or equivalent per week    Comment: 1 glass of wine a night  . Drug use: No  . Sexual activity: Yes  Other Topics Concern  . None  Social History Narrative   Health Care POA:    Emergency Contact:    End of Life Plan:    Who lives with you: Lives by himself in 1 story home.    Any pets: none   Diet: Patient has a varied diet of protein, starch, and vegetables.  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   Exercise: Patient golfs several times a week and does yoga at home 2x week.    Seatbelts: Patient reports wearing seat belt when in vehicle.   Wynelle LinkSun Exposure/Protection: Patient reports wearing sun screen intermittently.    Hobbies: golfing, teaching at [employer]      No Known Allergies Family History  Problem Relation Age of Onset  . Diabetes Mother   . Kidney disease Father   . Cancer Sister        ? pancreatic  . Heart disease Brother   . Cancer Sister        ? pancreatic  . Colon cancer Neg Hx   . Stomach cancer Neg Hx   . Rectal cancer Neg Hx   . Esophageal cancer Neg Hx   . Liver  cancer Neg Hx      Past medical history, social, surgical and family history all reviewed in electronic medical record.  No pertanent information unless stated regarding to the chief complaint.   Review of Systems:Review of systems updated and as accurate as of 04/30/17  No headache, visual changes, nausea, vomiting, diarrhea, constipation, dizziness, abdominal pain, skin rash, fevers, chills, night sweats, weight loss, swollen lymph nodes, body aches, joint swelling, chest pain, shortness of breath, mood changes.  Positive fatigue and mild muscle aches  Objective  Blood pressure (!) 142/64, pulse 65, height 5\' 9"  (1.753 m), weight 184 lb (83.5 kg), SpO2 98 %. Systems examined below as of 04/30/17   General: No apparent distress alert and oriented x3 mood and affect normal, dressed appropriately.  HEENT: Pupils equal, extraocular movements intact  Respiratory:  Patient's speak in full sentences and does not appear short of breath  Cardiovascular: No lower extremity edema, non tender, no erythema  Skin: Warm dry intact with no signs of infection or rash on extremities or on axial skeleton.  Abdomen: Soft nontender  Neuro: Cranial nerves II through XII are intact, neurovascularly intact in all extremities with 2+ DTRs and 2+ pulses.  Lymph: No lymphadenopathy of posterior or anterior cervical chain or axillae bilaterally.  Gait normal with good balance and coordination.  MSK:  Non tender with full range of motion and good stability and symmetric strength and tone of shoulders, elbows, wrist, hip, knee and ankles bilaterally.  Moderate arthritic changes in multiple joints Back Exam:  Inspection: Loss of lordosis with mild degenerative scoliosis Motion: Flexion 35 deg, Extension 15 deg, Side Bending to 25 deg bilaterally,  Rotation to 25 deg bilaterally  SLR laying: Negative tightness in the hamstrings bilaterally XSLR laying: Negative  Palpable tenderness: Moderate tightness in the paraspinal musculature lumbar spine right greater than left. FABER: negative. Sensory change: Gross sensation intact to all lumbar and sacral dermatomes.  Reflexes: 2+ at both patellar tendons, 2+ at achilles tendons, Babinski's downgoing.  Strength at foot  Plantar-flexion: 5/5 Dorsi-flexion: 5/5 Eversion: 5/5 Inversion: 5/5  Leg strength  Quad: 5/5 Hamstring: 5/5 Hip flexor: 5/5 Hip abductors: 5/5  Gait unremarkable.  Osteopathic findings C2 flexed rotated and side bent right C4 flexed rotated and side bent left C6 flexed rotated and side bent left T3 extended rotated and side bent right inhaled third rib T9 extended rotated and side bent left L2 flexed rotated and side bent right Sacrum right on right    Impression and Recommendations:     This case required medical decision making of moderate complexity.      Note: This dictation was prepared with  Dragon dictation along with smaller phrase technology. Any transcriptional errors that result from this process are unintentional.

## 2017-04-30 ENCOUNTER — Other Ambulatory Visit: Payer: Self-pay | Admitting: Family Medicine

## 2017-04-30 ENCOUNTER — Ambulatory Visit: Payer: Medicare Other | Admitting: Family Medicine

## 2017-04-30 ENCOUNTER — Encounter: Payer: Self-pay | Admitting: Family Medicine

## 2017-04-30 VITALS — BP 142/64 | HR 65 | Ht 69.0 in | Wt 184.0 lb

## 2017-04-30 DIAGNOSIS — M48061 Spinal stenosis, lumbar region without neurogenic claudication: Secondary | ICD-10-CM | POA: Diagnosis not present

## 2017-04-30 DIAGNOSIS — M5416 Radiculopathy, lumbar region: Secondary | ICD-10-CM | POA: Diagnosis not present

## 2017-04-30 DIAGNOSIS — M999 Biomechanical lesion, unspecified: Secondary | ICD-10-CM

## 2017-04-30 DIAGNOSIS — M9902 Segmental and somatic dysfunction of thoracic region: Secondary | ICD-10-CM | POA: Diagnosis not present

## 2017-04-30 NOTE — Assessment & Plan Note (Signed)
Decision today to treat with OMT was based on Physical Exam  After verbal consent patient was treated with HVLA, ME, FPR techniques in cervical, thoracic, lumbar and sacral areas  Patient tolerated the procedure well with improvement in symptoms  Patient given exercises, stretches and lifestyle modifications  See medications in patient instructions if given  Patient will follow up in 4-6 weeks 

## 2017-04-30 NOTE — Patient Instructions (Addendum)
Good to see you as always.  Firm memory foam non contour pillow  Bring feet up 5 degrees with sleep maybe Squeeze ball and rubber band when watching tv Love the shoes Back is decent Lets watch the neck and the numbness in the fingers Consider neal looking at adrenal fatigue with you being on steroid drops for a while See me again in 4-6 weeks Happy New Year!

## 2017-04-30 NOTE — Assessment & Plan Note (Signed)
Patient has been doing relatively well.  No need for aggressive therapies.  We will continue osteopathic manipulation in 4-6-week intervals.  Continue all medications at this time.  Encourage patient continue to work on more isometric strengthening of the core.  Follow-up again in 4-6 weeks

## 2017-05-04 ENCOUNTER — Other Ambulatory Visit: Payer: Self-pay | Admitting: *Deleted

## 2017-05-06 NOTE — Telephone Encounter (Signed)
Pt is calling for refill on his Allopurinol, Felodipine. Epic shows that we sent in the Felodipine on 01/08/2019but the pharmacy stated that they didn't receive this. Can we resend the Felodipine and add the Allopurinol to that. Myriam Jacobsonjw

## 2017-05-06 NOTE — Telephone Encounter (Signed)
Patient left message on nurse line requesting refills at [facility] [address] Paul [name]. [place], RN, BSN

## 2017-05-07 MED ORDER — FELODIPINE ER 10 MG PO TB24: 10.0000 mg | ORAL_TABLET | Freq: Every day | ORAL | 3 refills | Status: DC

## 2017-05-07 MED ORDER — POLYETHYLENE GLYCOL 3350 17 GM/SCOOP PO POWD: ORAL | 2 refills | Status: DC

## 2017-05-07 MED ORDER — ALLOPURINOL 100 MG PO TABS: 100.0000 mg | ORAL_TABLET | Freq: Every day | ORAL | 2 refills | Status: DC

## 2017-05-25 ENCOUNTER — Encounter: Payer: Self-pay | Admitting: Family Medicine

## 2017-05-25 ENCOUNTER — Ambulatory Visit (INDEPENDENT_AMBULATORY_CARE_PROVIDER_SITE_OTHER): Payer: Medicare Other | Admitting: Family Medicine

## 2017-05-25 ENCOUNTER — Other Ambulatory Visit: Payer: Self-pay

## 2017-05-25 VITALS — BP 112/70 | HR 81 | Temp 98.3°F | Ht 69.0 in | Wt 184.0 lb

## 2017-05-25 DIAGNOSIS — I10 Essential (primary) hypertension: Secondary | ICD-10-CM

## 2017-05-25 DIAGNOSIS — Z0000 Encounter for general adult medical examination without abnormal findings: Secondary | ICD-10-CM

## 2017-05-25 DIAGNOSIS — Z0001 Encounter for general adult medical examination with abnormal findings: Secondary | ICD-10-CM | POA: Diagnosis not present

## 2017-05-25 DIAGNOSIS — E039 Hypothyroidism, unspecified: Secondary | ICD-10-CM | POA: Diagnosis not present

## 2017-05-25 DIAGNOSIS — N183 Chronic kidney disease, stage 3 unspecified: Secondary | ICD-10-CM

## 2017-05-25 MED ORDER — MIRABEGRON ER 25 MG PO TB24: 25.0000 mg | ORAL_TABLET | Freq: Every day | ORAL | 3 refills | Status: DC

## 2017-05-25 NOTE — Progress Notes (Signed)
    CHIEF COMPLAINT / HPI: Checkup.  Taking his blood pressure medicines regularly.  At one point he started cutting the HCTZ tablet in half because he noticed he was having some mid afternoon fatigue.  He was also having multiple nighttime awakenings to urinate.  He then started having a snack in the afternoon felt more energized and realized that changing the dose of the diuretic really did not change his nighttime urinary symptoms so he is now back at full dose. #2.  Needs some refills of the mybetrik. 3.  Had bilateral cataract surgery and did well with the left eye.  The right eye is having some dryness and mild pain noted in the eyelid.  His ophthalmologist, Dr. Hazle Quantigby, put him on some medications and drops for that which has minimally helped.  He still has to use reading glasses for reading.  Far vision much better.  REVIEW OF SYSTEMS: Review of Systems  Constitutional: Negative for activity chang; no  appetite change and no unexpected weight change.  Eyes: no visual disturbance.  Neck: denies neck pain; no swallowing problems CV: No chest pain, no shortness of breath, no lower extremity edema. No change in exercise tolerance Respiratory: Negative for cough or wheezing.  No shortness of breath. Gastrointestinal: Negative for abdominal pain, no diarrhea and no  constipation. Not having any heartburn symptoms when he takes his medication appropriately. Genitourinary: Negative for decreased urine volume and  no difficulty urinating. Is having nocturnal urination as per HPI Musculoskeletal: Chronic low back pain essentially unchanged.  Also having some left deltoid problems associated with his golf swing. No muscle weakness. Skin: Negative for rash.  Psychiatric/Behavioral: Negative for behavioral problems; no sleep disturbance except for awakenings for nocturnal urination, and no  agitation.     PERTINENT  PMH / PSH: I have reviewed the patient's medications, allergies, past medical and  surgical history, smoking status and updated in the EMR as appropriate. Endoscopy in March showing some gastritis.  Was placed on twice daily PPI, then they decreased it to once daily PPI and once daily H2 blocker.  OBJECTIVE:  Vital signs reviewed GENERALl: Well developed, well nourished, in no acute distress. HEENT: PERRLA, EOMI, sclerae are nonicteric NECK: Supple, FROM, without lymphadenopathy.  THYROID: normal without nodularity CAROTID ARTERIES: without bruits LUNGS: clear to auscultation bilaterally. No wheezes or rales. Normal respiratory effort HEART: Regular rate and rhythm, no murmurs. Distal pulses are bilaterally symmetrical, 2+. ABDOMEN: soft with positive bowel sounds. No masses noted MSK: MOE x 4. Normal muscle strength, bulk and tone. SKIN no rash. Normal temperature. NEURO: no focal deficits. Normal gait. Normal balance.   ASSESSMENT / PLAN: Please see problem oriented charting for details

## 2017-05-26 ENCOUNTER — Encounter: Payer: Self-pay | Admitting: Family Medicine

## 2017-05-26 LAB — PSA: Prostate Specific Ag, Serum: 3.6 ng/mL (ref 0.0–4.0)

## 2017-05-26 LAB — CMP14+EGFR
ALT: 20 IU/L (ref 0–44)
AST: 29 IU/L (ref 0–40)
Albumin/Globulin Ratio: 1.4 (ref 1.2–2.2)
Albumin: 4.3 g/dL (ref 3.5–4.7)
Alkaline Phosphatase: 53 IU/L (ref 39–117)
BUN/Creatinine Ratio: 17 (ref 10–24)
BUN: 28 mg/dL — ABNORMAL HIGH (ref 8–27)
Bilirubin Total: 0.5 mg/dL (ref 0.0–1.2)
CO2: 28 mmol/L (ref 20–29)
Calcium: 9.4 mg/dL (ref 8.6–10.2)
Chloride: 92 mmol/L — ABNORMAL LOW (ref 96–106)
Creatinine, Ser: 1.63 mg/dL — ABNORMAL HIGH (ref 0.76–1.27)
GFR calc Af Amer: 45 mL/min/{1.73_m2} — ABNORMAL LOW (ref >59)
GFR calc non Af Amer: 39 mL/min/{1.73_m2} — ABNORMAL LOW (ref >59)
Globulin, Total: 3 g/dL (ref 1.5–4.5)
Glucose: 85 mg/dL (ref 65–99)
Potassium: 4.3 mmol/L (ref 3.5–5.2)
Sodium: 136 mmol/L (ref 134–144)
Total Protein: 7.3 g/dL (ref 6.0–8.5)

## 2017-05-26 LAB — LDL CHOLESTEROL, DIRECT: LDL Direct: 129 mg/dL — ABNORMAL HIGH (ref 0–99)

## 2017-05-26 LAB — TSH: TSH: 4.98 u[IU]/mL — ABNORMAL HIGH (ref 0.450–4.500)

## 2017-05-27 ENCOUNTER — Encounter: Payer: Medicare Other | Admitting: Family Medicine

## 2017-05-29 ENCOUNTER — Encounter: Payer: Self-pay | Admitting: Family Medicine

## 2017-05-29 ENCOUNTER — Ambulatory Visit (HOSPITAL_COMMUNITY)
Admission: EM | Admit: 2017-05-29 | Discharge: 2017-05-29 | Disposition: A | Discharge status: Home / Self Care | Payer: Medicare Other | Attending: Internal Medicine | Admitting: Internal Medicine

## 2017-05-29 ENCOUNTER — Ambulatory Visit (INDEPENDENT_AMBULATORY_CARE_PROVIDER_SITE_OTHER): Payer: Medicare Other

## 2017-05-29 ENCOUNTER — Other Ambulatory Visit: Payer: Self-pay

## 2017-05-29 ENCOUNTER — Encounter (HOSPITAL_COMMUNITY): Payer: Self-pay | Admitting: Emergency Medicine

## 2017-05-29 DIAGNOSIS — R05 Cough: Secondary | ICD-10-CM

## 2017-05-29 DIAGNOSIS — J069 Acute upper respiratory infection, unspecified: Secondary | ICD-10-CM | POA: Diagnosis not present

## 2017-05-29 DIAGNOSIS — R509 Fever, unspecified: Secondary | ICD-10-CM

## 2017-05-29 LAB — POCT URINALYSIS DIP (DEVICE)
Bilirubin Urine: NEGATIVE
Glucose, UA: NEGATIVE mg/dL
Ketones, ur: NEGATIVE mg/dL
Leukocytes, UA: NEGATIVE
Nitrite: NEGATIVE
Protein, ur: NEGATIVE mg/dL
Specific Gravity, Urine: 1.015 (ref 1.005–1.030)
Urobilinogen, UA: 0.2 mg/dL (ref 0.0–1.0)
pH: 7 (ref 5.0–8.0)

## 2017-05-29 MED ORDER — OSELTAMIVIR PHOSPHATE 75 MG PO CAPS: 75.0000 mg | ORAL_CAPSULE | Freq: Two times a day (BID) | ORAL | 0 refills | Status: AC

## 2017-05-29 NOTE — ED Provider Notes (Signed)
[facility]    CSN: [id number] Arrival date & time: 05/29/17  1902     History   Chief Complaint Chief Complaint  Patient presents with  . Fever    HPI Paul [name] is a 83 y.o. male.   Paul [name] presents with concern for fever today. TMAX of 100.8. States he saw his PCP 1/28 for annual physical, concern about possible exposure to illness. Yesterday started sneezing. Today with mild weakness, runny nose and slight cough. Without sore throat, ear pain, shortness of breath, abdominal pain, nausea, diarrhea, urinary symptoms. No other known ill contacts. States he is currently on antibiotics s/p eye surgery last week. Did get a flu vaccine this season. History of gerd, hypertension, kidney disease, arthritis.     ROS per HPI.       Past Medical History:  Diagnosis Date  . Cataract   . GERD (gastroesophageal reflux disease)   . Hiatal hernia   . Hypertension     Patient Active Problem List   Diagnosis Date Noted  . Murmur, cardiac 09/24/2016  . Well adult exam 05/21/2016  . Urge incontinence of urine 05/21/2016  . Spinal stenosis of lumbar region with radiculopathy 08/24/2015  . Arthritis of carpometacarpal joint 03/23/2013  . Nonallopathic lesion of thoracic region 12/16/2012  . Hypothyroidism 04/26/2012  . 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 Nonallopathic lesion of lumbar region 10/24/2010  . Nonallopathic lesion of sacral region 10/24/2010  . Pain in joint, shoulder region 07/16/2010  . CHRONIC KIDNEY DISEASE STAGE III (MODERATE) 03/15/2010  . BACK PAIN, LUMBAR, WITH RADICULOPATHY 08/30/2009  . GERD 03/09/2009  . H/O acute gouty arthritis 05/14/2007  . DEGENERATIVE DISC DISEASE, LUMBAR SPINE 07/24/2006  . Hyperlipidemia 06/25/2006  . ERECTILE DYSFUNCTION 06/25/2006  . HYPERTENSION, BENIGN SYSTEMIC 06/25/2006  . Asthma 06/25/2006    Past Surgical History:  Procedure Laterality Date  . TOTAL KNEE ARTHROPLASTY     left  . TRANSURETHRAL RESECTION OF PROSTATE   2011       Home Medications    Prior to Admission medications   Medication Sig Start Date End Date Taking? Authorizing Provider  allopurinol (ZYLOPRIM) 100 MG tablet Take 1 tablet (100 mg total) by mouth daily. 05/07/17   [name], MD  aspirin 81 MG chewable tablet Chew 81 mg by mouth daily.      [provider]  Cholecalciferol (VITAMIN D3 PO) Take 1 tablet by mouth daily.    [provider]  cloNIDine (CATAPRES - DOSED IN MG/24 HR) 0.3 mg/24hr patch apply 1 patch every week 06/17/16   [name], MD  colchicine 0.6 MG tablet take 1 tablet by mouth twice a day if needed 06/03/16   [name], MD  felodipine (PLENDIL) 10 MG 24 hr tablet Take 1 tablet (10 mg total) by mouth daily. 05/07/17   [name], MD  FLUAD 0.5 ML SUSY  02/13/16   [provider]  fluticasone (FLONASE) 50 MCG/ACT nasal spray Place 2 sprays into both nostrils daily. 06/09/14   Barbaraann Barthel, MD  hydrochlorothiazide (HYDRODIURIL) 25 MG tablet Take 1 tablet (25 mg total) by mouth daily. 09/24/16   [name], MD  lisinopril (PRINIVIL,ZESTRIL) 40 MG tablet Take 1 tablet (40 mg total) by mouth daily. 04/16/17   [name], MD  mirabegron ER (MYRBETRIQ) 25 MG TB24 tablet Take 1 tablet (25 mg total) by mouth daily. 05/25/17   [name], MD  oseltamivir (TAMIFLU) 75 MG capsule Take  1 capsule (75 mg total) by mouth every 12 (twelve) hours for 5 days. 05/29/17 06/03/17  Georgetta Haber, NP  pantoprazole (PROTONIX) 40 MG tablet Take 1 tablet (40 mg total) by mouth daily. 03/27/17   Pyrtle, Carie Caddy, MD  polyethylene glycol powder (GLYCOLAX/MIRALAX) powder take 17GM (DISSOLVED IN WATER) by mouth twice a day AT 10 AM AND 5 PM 05/07/17   [name], MD  pravastatin (PRAVACHOL) 40 MG tablet take 1 tablet by mouth once daily 10/08/16   [name], MD  ranitidine (ZANTAC) 150 MG tablet Take 1 tablet (150 mg total) by mouth daily after supper. 03/27/17   Pyrtle, Carie Caddy, MD  sildenafil (REVATIO) 20 MG  tablet TAKE 1 BY MOUTH AS DIRECTED FOR ERECTILE DYSFUNSTION 04/30/17   Moses Manners, MD    Family History Family History  Problem Relation Age of Onset  . Diabetes Mother   . Kidney disease Father   . Cancer Sister        ? pancreatic  . Heart disease Brother   . Cancer Sister        ? pancreatic  . Colon cancer Neg Hx   . Stomach cancer Neg Hx   . Rectal cancer Neg Hx   . Esophageal cancer Neg Hx   . Liver cancer Neg Hx     Social History Social History   Tobacco Use  . Smoking status: Former Smoker    Last attempt to quit: 04/28/1972    Years since quitting: 45.1  . Smokeless tobacco: Never Used  Substance Use Topics  . Alcohol use: Yes    Alcohol/week: 3.5 oz    Types: 7 Standard drinks or equivalent per week    Comment: 1 glass of wine a night  . Drug use: No     Allergies   Patient has no known allergies.   Review of Systems Review of Systems   Physical Exam Triage Vital Signs ED Triage Vitals  Enc Vitals Group     BP 05/29/17 1931 (!) 162/79     Pulse Rate 05/29/17 1931 76     Resp 05/29/17 1931 18     Temp 05/29/17 1931 99.4 F (37.4 C)     Temp src --      SpO2 05/29/17 1931 96 %     Weight --      Height --      Head Circumference --      Peak Flow --      Pain Score 05/29/17 1933 0     Pain Loc --      Pain Edu? --      Excl. in GC? --    No data found.  Updated Vital Signs BP (!) 162/79   Pulse 76   Temp 99.4 F (37.4 C)   Resp 18   SpO2 96%   Visual Acuity Right Eye Distance:   Left Eye Distance:   Bilateral Distance:    Right Eye Near:   Left Eye Near:    Bilateral Near:     Physical Exam  Constitutional: He is oriented to person, place, and time. He appears well-developed and well-nourished.  HENT:  Head: Normocephalic and atraumatic.  Right Ear: Tympanic membrane, external ear and ear canal normal.  Left Ear: Tympanic membrane, external ear and ear canal normal.  Nose: Rhinorrhea present. Right sinus exhibits no  maxillary sinus tenderness and no frontal sinus tenderness. Left sinus exhibits no maxillary sinus tenderness and no  frontal sinus tenderness.  Mouth/Throat: Uvula is midline, oropharynx is clear and moist and mucous membranes are normal.  Eyes: Pupils are equal, round, and reactive to light. Left conjunctiva is injected.  Mild injection to left eye without drainage; surgery last week  Neck: Normal range of motion.  Cardiovascular: Normal rate and regular rhythm.  Pulmonary/Chest: Effort normal and breath sounds normal.  Lymphadenopathy:    He has no cervical adenopathy.  Neurological: He is alert and oriented to person, place, and time.  Skin: Skin is warm and dry.  Vitals reviewed.    UC Treatments / Results  Labs (all labs ordered are listed, but only abnormal results are displayed) Labs Reviewed  POCT URINALYSIS DIP (DEVICE) - Abnormal; Notable for the following components:      Result Value   Hgb urine dipstick TRACE (*)    All other components within normal limits    EKG  EKG Interpretation None       Radiology Dg Chest 2 View  Result Date: 05/29/2017 CLINICAL DATA:  Fever, cough, congestion EXAM: CHEST  2 VIEW COMPARISON:  04/15/2010 FINDINGS: The heart size and mediastinal contours are within normal limits. Both lungs are clear. The visualized skeletal structures are unremarkable. IMPRESSION: No active cardiopulmonary disease. Electronically Signed   By: Elige KoHetal  Patel   On: 05/29/2017 20:46    Procedures Procedures (including critical care time)  Medications Ordered in UC Medications - No data to display   Initial Impression / Assessment and Plan / UC Course  I have reviewed the triage vital signs and the nursing notes.  Pertinent labs & imaging results that were available during my care of the patient were reviewed by me and considered in my medical decision making (see chart for details).     Temp in clinic of 99.6. Normal chest xray and urine today. URI  symptoms, otherwise without complaints or findings on exam. Possible exposure to illness at PCP office earlier this week. Discussed viral vs bacterial illness with patient. tamiflu provided at this time. Supportive cares recommended. Return precautions provided. If symptoms worsen or do not improve in the next week to return to be seen or to follow up with PCP.  Patient verbalized understanding and agreeable to plan.  Ambulatory out of clinic without difficulty.    Final Clinical Impressions(s) / UC Diagnoses   Final diagnoses:  Viral upper respiratory tract infection    ED Discharge Orders        Ordered    oseltamivir (TAMIFLU) 75 MG capsule  Every 12 hours     05/29/17 2056       Controlled Substance Prescriptions [place] Controlled Substance Registry consulted? Not Applicable   Georgetta HaberBurky, Natalie B, NP 05/29/17 2107

## 2017-05-29 NOTE — Discharge Instructions (Signed)
Push fluids to ensure adequate hydration and keep secretions thin.  Tylenol as needed for pain or fevers.  Tamiflu may decrease risk of complication.  If symptoms worsen, develop increased fevers, chills, lethargy, otherwise worsening, or do not improve in the next week to return to be seen or to follow up with your primary care provider.

## 2017-05-29 NOTE — ED Triage Notes (Signed)
Pt states he took his temp today and max reading 100.8. Pt has not taken any medicine for it. Temp is 99.4 in triage.

## 2017-06-03 NOTE — Progress Notes (Signed)
Tawana ScaleZach [name] D.O. [facility] [address] [place], [place] [address] Phone: [phone] Subjective:     CC: Back pain follow-up  NUU:[id number]:Subjective  Paul [name] is a 83 y.o. male coming in with complaint of back pain. Known spinal stenosis.  Has responded well to osteopathic manipulation.  Having some worsening pain.  Recently fever has caused patient to feel more agitated.  Feels that the back is worse.  More tightness in the glutes but no radiation down the leg.     Past Medical History:  Diagnosis Date  . Cataract   . GERD (gastroesophageal reflux disease)   . Hiatal hernia   . Hypertension    Past Surgical History:  Procedure Laterality Date  . TOTAL KNEE ARTHROPLASTY     left  . TRANSURETHRAL RESECTION OF PROSTATE  2011   Social History   Socioeconomic History  . Marital status: Legally Separated    Spouse name: None  . Number of children: 0  . Years of education: None  . Highest education level: None  Social Needs  . Financial resource strain: None  . Food insecurity - worry: None  . Food insecurity - inability: None  . Transportation needs - medical: None  . Transportation needs - non-medical: None  Occupational History  . 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 Smoking status: Former Smoker    Last attempt to quit: 04/28/1972    Years since quitting: 45.1  . Smokeless tobacco: Never Used  Substance and Sexual Activity  . Alcohol use: Yes    Alcohol/week: 3.5 oz    Types: 7 Standard drinks or equivalent per week    Comment: 1 glass of wine a night  . Drug use: No  . Sexual activity: Yes  Other Topics Concern  . None  Social History Narrative   Health Care POA:    Emergency Contact:    End of Life Plan:    Who lives with you: Lives by himself in 1 story home.    Any pets: none   Diet: Patient has a varied diet of protein, starch, and vegetables.  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   Exercise: Patient golfs several times a week and does yoga at home 2x week.    Seatbelts: Patient reports wearing seat belt when in vehicle.   Wynelle LinkSun Exposure/Protection: Patient reports wearing sun screen intermittently.    Hobbies: golfing, teaching at [employer]      No Known Allergies Family History  Problem Relation Age of Onset  . Diabetes Mother   . Kidney disease Father   . Cancer Sister        ? pancreatic  . Heart disease Brother   . Cancer Sister        ? pancreatic  . Colon cancer Neg Hx   . Stomach cancer Neg Hx   . Rectal cancer Neg Hx   . Esophageal cancer Neg Hx   . Liver cancer Neg Hx      Past medical history, social, surgical and family history all reviewed in electronic medical record.  No pertanent information unless stated regarding to the chief complaint.   Review of Systems:Review of systems updated and as accurate as of 06/04/17  No headache, visual changes, nausea, vomiting, diarrhea, constipation, dizziness, abdominal pain, skin rash, fevers, chills, night sweats, weight loss, swollen lymph nodes, body  aches, joint swelling, chest pain, shortness of breath, mood changes.  Positive muscle aches  Objective  Blood pressure 138/74, pulse 64, height 5\' 9"  (1.753 m), weight 182 lb (82.6 kg), SpO2 98 %. Systems examined below as of 06/04/17   General: No apparent distress alert and oriented x3 mood and affect normal, dressed appropriately.  HEENT: Pupils equal, extraocular movements intact  Respiratory: Patient's speak in full sentences and does not appear short of breath  Cardiovascular: No lower extremity edema, non tender, no erythema  Skin: Warm dry intact with no signs of infection or rash on extremities or on axial skeleton.  Abdomen: Soft nontender  Neuro: Cranial nerves II through XII are intact, neurovascularly intact in all extremities with 2+ DTRs and 2+ pulses.  Lymph: No lymphadenopathy of  posterior or anterior cervical chain or axillae bilaterally.  Gait normal with good balance and coordination.  MSK:  Non tender with full range of motion and good stability and symmetric strength and tone of shoulders, elbows, wrist, hip, knee and ankles bilaterally.  Back Exam:  Inspection: Mild loss of lordosis Motion: Flexion 25 deg, Extension 15 deg, Side Bending to 35 deg bilaterally,  Rotation to 35 deg bilaterally  SLR laying: Negative  XSLR laying: Negative  Palpable tenderness: Tender to palpation the paraspinal musculature lumbar spine right greater than left. FABER: Tightness right. Sensory change: Gross sensation intact to all lumbar and sacral dermatomes.  Reflexes: 2+ at both patellar tendons, 2+ at achilles tendons, Babinski's downgoing.  Strength at foot  Plantar-flexion: 5/5 Dorsi-flexion: 5/5 Eversion: 5/5 Inversion: 5/5  Leg strength  Quad: 5/5 Hamstring: 5/5 Hip flexor: 5/5 Hip abductors: 5/5  Gait unremarkable.   Osteopathic findings C6 flexed rotated and side bent left T3 extended rotated and side bent right inhaled third rib T7 extended rotated and side bent left L1 flexed rotated and side bent right Sacrum right on right     Impression and Recommendations:     This case required medical decision making of moderate complexity.      Note: This dictation was prepared with Dragon dictation along with smaller phrase technology. Any transcriptional errors that result from this process are unintentional.

## 2017-06-04 ENCOUNTER — Ambulatory Visit: Payer: Medicare Other | Admitting: Family Medicine

## 2017-06-04 ENCOUNTER — Encounter: Payer: Self-pay | Admitting: Family Medicine

## 2017-06-04 VITALS — BP 138/74 | HR 64 | Ht 69.0 in | Wt 182.0 lb

## 2017-06-04 DIAGNOSIS — M999 Biomechanical lesion, unspecified: Secondary | ICD-10-CM | POA: Diagnosis not present

## 2017-06-04 DIAGNOSIS — M5137 Other intervertebral disc degeneration, lumbosacral region: Secondary | ICD-10-CM | POA: Diagnosis not present

## 2017-06-04 DIAGNOSIS — M48061 Spinal stenosis, lumbar region without neurogenic claudication: Secondary | ICD-10-CM

## 2017-06-04 DIAGNOSIS — M9903 Segmental and somatic dysfunction of lumbar region: Secondary | ICD-10-CM | POA: Diagnosis not present

## 2017-06-04 DIAGNOSIS — M5416 Radiculopathy, lumbar region: Secondary | ICD-10-CM

## 2017-06-04 NOTE — Assessment & Plan Note (Signed)
Stable overall. Mild worsening due to illness.

## 2017-06-04 NOTE — Assessment & Plan Note (Signed)
Patient has known degenerative disc disease as well as spinal stenosis.  Patient has responded well to osteopathic manipulation.  I believe that the patient's recent illness likely contributed to some more muscle aches and pains.  Patient will follow up with me again in 4 weeks

## 2017-06-04 NOTE — Assessment & Plan Note (Signed)
Decision today to treat with OMT was based on Physical Exam  After verbal consent patient was treated with HVLA, ME, FPR techniques in cervical, thoracic, lumbar and sacral areas  Patient tolerated the procedure well with improvement in symptoms  Patient given exercises, stretches and lifestyle modifications  See medications in patient instructions if given  Patient will follow up in 4 weeks 

## 2017-06-04 NOTE — Patient Instructions (Signed)
Good to see you  I am sorry you are not feeling good  Dr. Gweneth Dimitriim Bevis. [phone] See me again in 3 weeks

## 2017-06-24 NOTE — Progress Notes (Signed)
Paul [name] D.O. [facility] [address] [place], [place] [address] Phone: [phone] Subjective:      CC: back pain follow up   NFA:[id number]:Subjective  Paul RoanDavid [name] is a 83 y.o. male coming in with complaint of back pain follow up .  Seen patient multiple times and is responded very well to osteopathic manipulation for severe spinal stenosis of the lumbar spine.  Patient continues to take his medications sparingly.  Continues to be very active.  Plays golf on a regular basis.  Has noted some decreasing rotation of the back on the right.     Past Medical History:  Diagnosis Date  . Cataract   . GERD (gastroesophageal reflux disease)   . Hiatal hernia   . Hypertension    Past Surgical History:  Procedure Laterality Date  . TOTAL KNEE ARTHROPLASTY     left  . TRANSURETHRAL RESECTION OF PROSTATE  2011   Social History   Socioeconomic History  . Marital status: Legally Separated    Spouse name: None  . Number of children: 0  . Years of education: None  . Highest education level: None  Social Needs  . Financial resource strain: None  . Food insecurity - worry: None  . Food insecurity - inability: None  . Transportation needs - medical: None  . Transportation needs - non-medical: None  Occupational History  . 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 Smoking status: Former Smoker    Last attempt to quit: 04/28/1972    Years since quitting: 45.1  . Smokeless tobacco: Never Used  Substance and Sexual Activity  . Alcohol use: Yes    Alcohol/week: 3.5 oz    Types: 7 Standard drinks or equivalent per week    Comment: 1 glass of wine a night  . Drug use: No  . Sexual activity: Yes  Other Topics Concern  . None  Social History Narrative   Health Care POA:    Emergency Contact:    End of Life Plan:    Who lives with you: Lives by himself in 1 story home.    Any pets: none   Diet:  Patient has a varied diet of protein, starch, and vegetables.  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   Exercise: Patient golfs several times a week and does yoga at home 2x week.    Seatbelts: Patient reports wearing seat belt when in vehicle.   Wynelle LinkSun Exposure/Protection: Patient reports wearing sun screen intermittently.    Hobbies: golfing, teaching at [employer]      No Known Allergies Family History  Problem Relation Age of Onset  . Diabetes Mother   . Kidney disease Father   . Cancer Sister        ? pancreatic  . Heart disease Brother   . Cancer Sister        ? pancreatic  . Colon cancer Neg Hx   . Stomach cancer Neg Hx   . Rectal cancer Neg Hx   . Esophageal cancer Neg Hx   . Liver cancer Neg Hx      Past medical history, social, surgical and family history all reviewed in electronic medical record.  No pertanent information unless stated regarding to the chief complaint.   Review of Systems:Review of systems updated and as accurate as of 06/25/17  No headache, visual changes, nausea, vomiting, diarrhea,  constipation, dizziness, abdominal pain, skin rash, fevers, chills, night sweats, weight loss, swollen lymph nodes, body aches, joint swelling, chest pain, shortness of breath, mood changes.  Positive muscle aches  Objective  Blood pressure 130/62, pulse 66, height 5\' 9"  (1.753 m), weight 181 lb (82.1 kg), SpO2 97 %. Systems examined below as of 06/25/17   General: No apparent distress alert and oriented x3 mood and affect normal, dressed appropriately.  HEENT: Pupils equal, extraocular movements intact  Respiratory: Patient's speak in full sentences and does not appear short of breath  Cardiovascular: No lower extremity edema, non tender, no erythema  Skin: Warm dry intact with no signs of infection or rash on extremities or on axial skeleton.  Abdomen: Soft nontender  Neuro: Cranial nerves II through XII are intact, neurovascularly intact in  all extremities with 2+ DTRs and 2+ pulses.  Lymph: No lymphadenopathy of posterior or anterior cervical chain or axillae bilaterally.  Gait normal with good balance and coordination.  MSK: Mild tender with full range of motion and good stability and symmetric strength and tone of shoulders, elbows, wrist, hip, knee and ankles bilaterally.  Arthritic changes noted  Back Exam:  Inspection: Degenerative scoliosis Motion: Flexion 25 deg, Extension 15 deg, Side Bending to 35 deg bilaterally,  Rotation to 35 deg bilaterally  SLR laying: Negative  XSLR laying: Negative  Palpable tenderness: Tender to palpation in the paraspinal musculature.  Diffusely. FABER: Positive bilateral. Sensory change: Gross sensation intact to all lumbar and sacral dermatomes.  Reflexes: 2+ at both patellar tendons, 2+ at achilles tendons, Babinski's downgoing.  Strength at foot  Plantar-flexion: 5/5 Dorsi-flexion: 5/5 Eversion: 5/5 Inversion: 5/5  Leg strength  Quad: 5/5 Hamstring: 5/5 Hip flexor: 5/5 Hip abductors: 5/5  Gait unremarkable.  Osteopathic findings C2 flexed rotated and side bent right T3 extended rotated and side bent right inhaled third rib L3 flexed rotated and side bent right Sacrum right on right      Impression and Recommendations:     This case required medical decision making of moderate complexity.      Note: This dictation was prepared with Dragon dictation along with smaller phrase technology. Any transcriptional errors that result from this process are unintentional.

## 2017-06-25 ENCOUNTER — Encounter: Payer: Self-pay | Admitting: Family Medicine

## 2017-06-25 ENCOUNTER — Ambulatory Visit: Payer: Medicare Other | Admitting: Family Medicine

## 2017-06-25 VITALS — BP 130/62 | HR 66 | Ht 69.0 in | Wt 181.0 lb

## 2017-06-25 DIAGNOSIS — M5416 Radiculopathy, lumbar region: Secondary | ICD-10-CM | POA: Diagnosis not present

## 2017-06-25 DIAGNOSIS — M999 Biomechanical lesion, unspecified: Secondary | ICD-10-CM

## 2017-06-25 DIAGNOSIS — M48061 Spinal stenosis, lumbar region without neurogenic claudication: Secondary | ICD-10-CM

## 2017-06-25 DIAGNOSIS — M9982 Other biomechanical lesions of thoracic region: Secondary | ICD-10-CM

## 2017-06-25 NOTE — Assessment & Plan Note (Signed)
Decision today to treat with OMT was based on Physical Exam  After verbal consent patient was treated with HVLA, ME, FPR techniques in cervical, thoracic, lumbar and sacral areas  Patient tolerated the procedure well with improvement in symptoms  Patient given exercises, stretches and lifestyle modifications  See medications in patient instructions if given  Patient will follow up in 4 weeks 

## 2017-06-25 NOTE — Assessment & Plan Note (Signed)
Degenerative spinal stenosis noted.  Discussed with patient again at great length.  Had some progression with the last MRI.  Does not need an epidural.  Patient doing relatively well.  Follow-up again in 4-6 weeks.

## 2017-06-25 NOTE — Patient Instructions (Signed)
Good to see you  pennsaid pinkie amount topically 2 times daily as needed.  Watch the wheel barrow.  Rotation of the back is key and Kana will give you some different ones but be careful to avoid extension of the back  Otherwise see me again in 4-6 weeks!

## 2017-07-13 ENCOUNTER — Other Ambulatory Visit: Payer: Self-pay

## 2017-07-15 MED ORDER — CLONIDINE 0.3 MG/24HR TD PTWK: 0.3000 mg | MEDICATED_PATCH | TRANSDERMAL | 3 refills | Status: DC

## 2017-07-17 ENCOUNTER — Encounter: Payer: Self-pay | Admitting: Family Medicine

## 2017-07-17 ENCOUNTER — Ambulatory Visit: Payer: Medicare Other | Admitting: Family Medicine

## 2017-07-17 VITALS — BP 134/74 | Ht 69.0 in | Wt 181.0 lb

## 2017-07-17 DIAGNOSIS — M25532 Pain in left wrist: Secondary | ICD-10-CM | POA: Diagnosis not present

## 2017-07-17 DIAGNOSIS — G8929 Other chronic pain: Secondary | ICD-10-CM

## 2017-07-17 DIAGNOSIS — F5232 Male orgasmic disorder: Secondary | ICD-10-CM | POA: Diagnosis not present

## 2017-07-17 DIAGNOSIS — L659 Nonscarring hair loss, unspecified: Secondary | ICD-10-CM | POA: Diagnosis not present

## 2017-07-17 NOTE — Assessment & Plan Note (Signed)
We discussed evaluation of his hair loss today we decided to check testosterone level.  He intermittently needs sildenafil

## 2017-07-17 NOTE — Assessment & Plan Note (Signed)
Looking at his old imaging, he has chronic scaphoid lunate dissociation.  Oddly and this does not seem to bother him during golf.  I think when he was doing the shoveling associated with bulging his yard he aggravated this.  We discussed about modification of activity.  Icing.  Compression sleeve.  I did a brief ultrasound in the clinic today which showed some mild DJD and osteophytes and a widened distance between the scaphoid and lunate bones particularly with his clench.  If his symptoms do not resolve or if it worsens he will return to clinic.

## 2017-07-17 NOTE — Assessment & Plan Note (Signed)
We discussed issues.  His hair loss seems diffuse.  Will check testosterone level.  May just be physiologic telogen effluvium

## 2017-07-17 NOTE — Patient Instructions (Signed)
I have put some lab work in for you. You mat get it drawn at your convenience, Rest the wrist! Great to see you

## 2017-07-17 NOTE — Progress Notes (Signed)
    CHIEF COMPLAINT / HPI: Left wrist pain Noted in the last 10 days.  Has had some swelling, with a pocket of fluid noted.  In the last 2 weeks he has been doing some mulch on his yard and has been shoveling more than usual.  Has had problems with this wrist before and had an MRI several years ago.  Wrist does not bother him while he is playing golf.  The swelling goes down with icing.  Activities such as picking up things like a coffee cup or skillet cause pain.  REVIEW OF SYSTEMS: No unusual weight change.  Wrist pain as per HPI.  Also notes that he has been  noting thinning hair diffusely on the scalp for the last several months.  PERTINENT  PMH / PSH: I have reviewed the patient's medications, allergies, past medical and surgical history, smoking status and updated in the EMR as appropriate.   OBJECTIVE: GENERAL: Well-developed male no acute distress SCALP: Hair appears to be symmetrical.  Mild frontotemporal hairline recession. WRISTS: Left wrist has some dorsal swelling noted compared with the right.  Left wrist that is mildly tender to palpation over the swollen area.  He has full range of motion flexion extension, lateral and medial rotation.  Pain with resisted extension.  Grip strength is normal. VASCULAR: Radial pulses 2+ bilateral symmetrical NEURO: Intact sensation soft touch bilateral hands Skin: Area of the left wrist is without any sign of erythema; there is small patch of dry skin; there is no lesion or rash.   IMAGING: June 2014, complete left wrist.  Most significant finding is widened space between the scaphoid and lunate bones. ASSESSMENT / PLAN: Please see problem oriented charting for details

## 2017-07-20 ENCOUNTER — Other Ambulatory Visit: Payer: Medicare Other

## 2017-07-20 DIAGNOSIS — F5232 Male orgasmic disorder: Secondary | ICD-10-CM

## 2017-07-20 DIAGNOSIS — L659 Nonscarring hair loss, unspecified: Secondary | ICD-10-CM

## 2017-07-21 LAB — TESTOSTERONE, FREE, TOTAL, SHBG
Sex Hormone Binding: 57.5 nmol/L (ref 19.3–76.4)
Testosterone, Free: 3.7 pg/mL — ABNORMAL LOW (ref 6.6–18.1)
Testosterone: 290 ng/dL (ref 264–916)

## 2017-07-24 ENCOUNTER — Encounter: Payer: Self-pay | Admitting: Family Medicine

## 2017-07-28 ENCOUNTER — Encounter: Payer: Self-pay | Admitting: Family Medicine

## 2017-07-30 ENCOUNTER — Ambulatory Visit: Payer: Medicare Other | Admitting: Family Medicine

## 2017-08-07 ENCOUNTER — Other Ambulatory Visit: Payer: Self-pay

## 2017-08-07 MED ORDER — FELODIPINE ER 10 MG PO TB24: 10.0000 mg | ORAL_TABLET | Freq: Every day | ORAL | 3 refills | Status: DC

## 2017-08-08 NOTE — Progress Notes (Signed)
Tawana ScaleZach Anessia Oakland D.O. [facility] [address] [place], [place] [address] Phone: [phone]([phone] Subjective:    I'm seeing this patient by the request  of:    CC: Back pain follow-up  OZH:[id number]:Subjective  Paul RoanDavid [name] is a 83 y.o. male coming in with complaint of neck pain.  Patient does have known spinal stenosis.  Discussed with patient in great length and has been very active.  Patient has noticed some mild tightness but nothing severe.  No weakness in the legs.  Saw primary care provider and was found to have some mildly low testosterone.  Discussing the possibility of supplementation.  Patient does state that he is having some thinning hair as well as some fatigue and mild weight gain.  Patient is doing well has been very active.  Playing golf most days of the week     Past Medical History:  Diagnosis Date  . Cataract   . GERD (gastroesophageal reflux disease)   . Hiatal hernia   . Hypertension    Past Surgical History:  Procedure Laterality Date  . TOTAL KNEE ARTHROPLASTY     left  . TRANSURETHRAL RESECTION OF PROSTATE  2011   Social History   Socioeconomic History  . Marital status: Legally Separated    Spouse name: Not on file  . Number of children: 0  . Years of education: Not on file  . Highest education level: Not on file  Occupational History  . 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 Financial resource strain: Not on file  . Food insecurity:    Worry: Not on file    Inability: Not on file  . Transportation needs:    Medical: Not on file    Non-medical: Not on file  Tobacco Use  . Smoking status: Former Smoker    Last attempt to quit: 04/28/1972    Years since quitting: 45.3  . Smokeless tobacco: Never Used  Substance and Sexual Activity  . Alcohol use: Yes    Alcohol/week: 3.5 oz    Types: 7 Standard drinks or equivalent per week    Comment: 1 glass of wine a night  .  Drug use: No  . Sexual activity: Yes  Lifestyle  . Physical activity:    Days per week: Not on file    Minutes per session: Not on file  . Stress: Not on file  Relationships  . Social connections:    Talks on phone: Not on file    Gets together: Not on file    Attends religious service: Not on file    Active member of club or organization: Not on file    Attends meetings of clubs or organizations: Not on file    Relationship status: Not on file  Other Topics Concern  . Not on file  Social History Narrative   Health Care POA:    Emergency Contact:    End of Life Plan:    Who lives with you: Lives by himself in 1 story home.    Any pets: none   Diet: Patient has a varied diet of protein, starch, and vegetables.  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   Exercise: Patient golfs several times a week and does yoga at home 2x week.    Seatbelts: Patient reports wearing seat belt when in vehicle.   Wynelle LinkSun Exposure/Protection: Patient reports wearing sun screen  intermittently.    Hobbies: golfing, teaching at [employer]      No Known Allergies Family History  Problem Relation Age of Onset  . Diabetes Mother   . Kidney disease Father   . Cancer Sister        ? pancreatic  . Heart disease Brother   . Cancer Sister        ? pancreatic  . Colon cancer Neg Hx   . Stomach cancer Neg Hx   . Rectal cancer Neg Hx   . Esophageal cancer Neg Hx   . Liver cancer Neg Hx      Past medical history, social, surgical and family history all reviewed in electronic medical record.  No pertanent information unless stated regarding to the chief complaint.   Review of Systems:Review of systems updated and as accurate as of 08/10/17  No headache, visual changes, nausea, vomiting, diarrhea, constipation, dizziness, abdominal pain, skin rash, fevers, chills, night sweats, weight loss, swollen lymph nodes, body aches, joint swelling, chest pain, shortness of breath, mood  changes.  Mild positive muscle aches and fatigue  Objective  Blood pressure 116/74, pulse (!) 58, height 5\' 9"  (1.753 m), weight 182 lb (82.6 kg), SpO2 99 %. Systems examined below as of 08/10/17   General: No apparent distress alert and oriented x3 mood and affect normal, dressed appropriately.  HEENT: Pupils equal, extraocular movements intact  Respiratory: Patient's speak in full sentences and does not appear short of breath  Cardiovascular: No lower extremity edema, non tender, no erythema  Skin: Warm dry intact with no signs of infection or rash on extremities or on axial skeleton.  Abdomen: Soft nontender  Neuro: Cranial nerves II through XII are intact, neurovascularly intact in all extremities with 2+ DTRs and 2+ pulses.  Lymph: No lymphadenopathy of posterior or anterior cervical chain or axillae bilaterally.  Gait normal with good balance and coordination.  MSK:  Non tender with full range of motion and good stability and symmetric strength and tone of shoulders, elbows, wrist, hip, knee and ankles bilaterally.  Moderate arthritic changes of multiple joints  Back exam shows the patient does have loss of lordosis and does have some degenerative scoliosis.  Significant tightness with straight leg test bilaterally.  Patient has positive Pearlean Brownie on the right side.  Neurovascularly intact distally with full strength.  Patient does have some limited range of motion in all planes.   Osteopathic findings C2 flexed rotated and side bent right C4 flexed rotated and side bent left C6 flexed rotated and side bent left T3 extended rotated and side bent right inhaled third rib T9 extended rotated and side bent left L2 flexed rotated and side bent right L4 flexed rotated and side bent right sacrum right on right    Impression and Recommendations:     This case required medical decision making of moderate complexity.      Note: This dictation was prepared with Dragon dictation along  with smaller phrase technology. Any transcriptional errors that result from this process are unintentional.

## 2017-08-10 ENCOUNTER — Ambulatory Visit: Payer: Medicare Other | Admitting: Family Medicine

## 2017-08-10 ENCOUNTER — Encounter: Payer: Self-pay | Admitting: Family Medicine

## 2017-08-10 VITALS — BP 116/74 | HR 58 | Ht 69.0 in | Wt 182.0 lb

## 2017-08-10 DIAGNOSIS — M48061 Spinal stenosis, lumbar region without neurogenic claudication: Secondary | ICD-10-CM

## 2017-08-10 DIAGNOSIS — M999 Biomechanical lesion, unspecified: Secondary | ICD-10-CM | POA: Diagnosis not present

## 2017-08-10 DIAGNOSIS — M5416 Radiculopathy, lumbar region: Secondary | ICD-10-CM

## 2017-08-10 MED ORDER — PREDNISONE 50 MG PO TABS: 50.0000 mg | ORAL_TABLET | Freq: Every day | ORAL | 0 refills | Status: DC

## 2017-08-10 NOTE — Assessment & Plan Note (Signed)
Stable overall.  Patient's most recent imaging did not show the patient has had some progression.  Nothing is severe though.  Nothing that is stopping from activities.  We discussed icing regimen which patient has been doing relatively frequently.  Patient will see me again in 3-4 weeks.

## 2017-08-10 NOTE — Patient Instructions (Addendum)
Prednisone daily for 5 days DHEA 50 mg daily for 4 weeks.  Keep hitting the greens See me again in 3 weeks if not better we will do injeciton in the wrist

## 2017-08-10 NOTE — Assessment & Plan Note (Signed)
Decision today to treat with OMT was based on Physical Exam  After verbal consent patient was treated with HVLA, ME, FPR techniques in cervical, thoracic, lumbar and sacral areas  Patient tolerated the procedure well with improvement in symptoms  Patient given exercises, stretches and lifestyle modifications  See medications in patient instructions if given  Patient will follow up in 4 weeks 

## 2017-08-24 ENCOUNTER — Other Ambulatory Visit: Payer: Self-pay | Admitting: Family Medicine

## 2017-09-06 NOTE — Progress Notes (Signed)
[name] [address] [place], [place] [address] Phone: [phone] Subjective:     CC: Back pain follow-up  ION:[id number]  Paul [name] is a 83 y.o. male coming in with complaint of back pain.  Patient describes pain as a dull, throbbing aching sensation.  Has known lumbar spinal stenosis.  Has been doing very well.  Patient states that he was able to even play golf 6 times last week.  Patient is doing DHEA and is noticing some improvement overall.     Past Medical History:  Diagnosis Date  . Cataract   . GERD (gastroesophageal reflux disease)   . Hiatal hernia   . Hypertension    Past Surgical History:  Procedure Laterality Date  . TOTAL KNEE ARTHROPLASTY     left  . TRANSURETHRAL RESECTION OF PROSTATE  2011   Social History   Socioeconomic History  . Marital status: Legally Separated    Spouse name: Not on file  . Number of children: 0  . Years of education: Not on file  . Highest education level: Not on file  Occupational History  . 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 Financial resource strain: Not on file  . Food insecurity:    Worry: Not on file    Inability: Not on file  . Transportation needs:    Medical: Not on file    Non-medical: Not on file  Tobacco Use  . Smoking status: Former Smoker    Last attempt to quit: 04/28/1972    Years since quitting: 45.3  . Smokeless tobacco: Never Used  Substance and Sexual Activity  . Alcohol use: Yes    Alcohol/week: 3.5 oz    Types: 7 Standard drinks or equivalent per week    Comment: 1 glass of wine a night  . Drug use: No  . Sexual activity: Yes  Lifestyle  . Physical activity:    Days per week: Not on file    Minutes per session: Not on file  . Stress: Not on file  Relationships  . Social connections:    Talks on phone: Not on file    Gets together: Not on file    Attends religious  service: Not on file    Active member of club or organization: Not on file    Attends meetings of clubs or organizations: Not on file    Relationship status: Not on file  Other Topics Concern  . Not on file  Social History Narrative   Health Care POA:    Emergency Contact:    End of Life Plan:    Who lives with you: Lives by himself in 1 story home.    Any pets: none   Diet: Patient has a varied diet of protein, starch, and vegetables.  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   Exercise: Patient golfs several times a week and does yoga at home 2x week.    Seatbelts: Patient reports wearing seat belt when in vehicle.   [name] Exposure/Protection: Patient reports wearing sun screen intermittently.    Hobbies: golfing, teaching at [employer]      No Known Allergies Family History  Problem Relation Age of Onset  . Diabetes Mother   . Kidney disease Father   . Cancer Sister        ? pancreatic  . Heart disease Brother   .  Cancer Sister        ? pancreatic  . Colon cancer Neg Hx   . Stomach cancer Neg Hx   . Rectal cancer Neg Hx   . Esophageal cancer Neg Hx   . Liver cancer Neg Hx      Past medical history, social, surgical and family history all reviewed in electronic medical record.  No pertanent information unless stated regarding to the chief complaint.   Review of Systems:Review of systems updated and as accurate as of 09/07/17  No headache, visual changes, nausea, vomiting, diarrhea, constipation, dizziness, abdominal pain, skin rash, fevers, chills, night sweats, weight loss, swollen lymph nodes, body aches, joint swelling, muscle aches, chest pain, shortness of breath, mood changes.   Objective  Blood pressure 136/78, pulse (!) 58, height  (1.753 m), weight 185 lb (83.9 kg), SpO2 98 %. Systems examined below as of 09/07/17   General: No apparent distress alert and oriented x3 mood and affect normal, dressed appropriately.  HEENT: Pupils  equal, extraocular movements intact  Respiratory: Patient's speak in full sentences and does not appear short of breath  Cardiovascular: No lower extremity edema, non tender, no erythema  Skin: Warm dry intact with no signs of infection or rash on extremities or on axial skeleton.  Abdomen: Soft nontender  Neuro: Cranial nerves II through XII are intact, neurovascularly intact in all extremities with 2+ DTRs and 2+ pulses.  Lymph: No lymphadenopathy of posterior or anterior cervical chain or axillae bilaterally.  Gait normal with good balance and coordination.  MSK:  Non tender with full range of motion and good stability and symmetric strength and tone of shoulders, elbows, wrist, hip, knee and ankles bilaterally.  Mild arthritic changes of multiple joints  Back exam still shows loss of lordosis with some mild degenerative scoliosis.  Patient does have some tightness with Pearlean Brownie bilaterally but improved.  Negative straight leg test.  Neurovascularly intact distally.  5 out of 5 strength in lower extremities.  Osteopathic findings C6 flexed rotated and side bent left T6 extended rotated and side bent left L2 flexed rotated and side bent right Sacrum right on right    Impression and Recommendations:     This case required medical decision making of moderate complexity.      Note: This dictation was prepared with Dragon dictation along with smaller phrase technology. Any transcriptional errors that result from this process are unintentional.

## 2017-09-07 ENCOUNTER — Encounter: Payer: Self-pay | Admitting: Family Medicine

## 2017-09-07 ENCOUNTER — Ambulatory Visit: Payer: Medicare Other | Admitting: Family Medicine

## 2017-09-07 VITALS — BP 136/78 | HR 58 | Ht 69.0 in | Wt 185.0 lb

## 2017-09-07 DIAGNOSIS — M9901 Segmental and somatic dysfunction of cervical region: Secondary | ICD-10-CM | POA: Diagnosis not present

## 2017-09-07 DIAGNOSIS — M9902 Segmental and somatic dysfunction of thoracic region: Secondary | ICD-10-CM

## 2017-09-07 DIAGNOSIS — M48061 Spinal stenosis, lumbar region without neurogenic claudication: Secondary | ICD-10-CM

## 2017-09-07 DIAGNOSIS — M9903 Segmental and somatic dysfunction of lumbar region: Secondary | ICD-10-CM

## 2017-09-07 DIAGNOSIS — M5416 Radiculopathy, lumbar region: Secondary | ICD-10-CM | POA: Diagnosis not present

## 2017-09-07 DIAGNOSIS — M999 Biomechanical lesion, unspecified: Secondary | ICD-10-CM | POA: Diagnosis not present

## 2017-09-07 DIAGNOSIS — M9904 Segmental and somatic dysfunction of sacral region: Secondary | ICD-10-CM | POA: Diagnosis not present

## 2017-09-07 NOTE — Assessment & Plan Note (Signed)
Decision today to treat with OMT was based on Physical Exam  After verbal consent patient was treated with HVLA, ME, FPR techniques in cervical, thoracic, lumbar and sacral areas  Patient tolerated the procedure well with improvement in symptoms  Patient given exercises, stretches and lifestyle modifications  See medications in patient instructions if given  Patient will follow up in 4-6 weeks 

## 2017-09-07 NOTE — Patient Instructions (Signed)
You are awesome Watch the wrist  See me again in 4-6 weeks

## 2017-09-07 NOTE — Assessment & Plan Note (Signed)
Known spinal stenosis.  Has done well with conservative therapy.  Discussed icing regimen and home exercises.  Discussed which activities to do which wants to avoid.  Patient denies any numbness or any tingling.  Patient responds well to the conservative therapy.  Patient will follow-up in 4 to 6 weeks

## 2017-09-08 ENCOUNTER — Encounter: Payer: Self-pay | Admitting: Family Medicine

## 2017-09-19 ENCOUNTER — Other Ambulatory Visit: Payer: Self-pay | Admitting: Family Medicine

## 2017-09-22 ENCOUNTER — Other Ambulatory Visit: Payer: Self-pay | Admitting: Family Medicine

## 2017-10-05 ENCOUNTER — Encounter: Payer: Self-pay | Admitting: Family Medicine

## 2017-10-05 ENCOUNTER — Ambulatory Visit: Payer: Medicare Other | Admitting: Family Medicine

## 2017-10-06 ENCOUNTER — Telehealth: Payer: Self-pay

## 2017-10-06 NOTE — Telephone Encounter (Signed)
[place] in [place] and having trouble with his BP being elevated. Has sent a MyChart message and would like PCP to review.   Call back is [phone].  Ples SpecterAlisa Senya Hinzman, RN [facility] RN)

## 2017-10-07 NOTE — Telephone Encounter (Signed)
RN TEAM I answered him by My Chart. Don't know if you need to call him back or not THANKS! Paul LevySara Jeananne [name]

## 2017-10-13 NOTE — Progress Notes (Signed)
[name] [address] [place], [place] [address] Phone: [phone] Subjective:     CC: Back pain follow-up  BJY:[id number]  Paul [name] is a 83 y.o. male coming in with complaint of back pain.  Known to have spinal stenosis that is fairly severe.  Patient was out of the state and playing golf.  Had a hypertensive urgency.  Was told that it was altitude sickness.  Patient is come back and blood pressure is improved again.  Still having some tightness of the back though.  States that he is feeling ill overall for a while.  Now feeling better again.  Trying to get back to his regular activities including working out which he has not done since he has returned.     Past Medical History:  Diagnosis Date  . Cataract   . GERD (gastroesophageal reflux disease)   . Hiatal hernia   . Hypertension    Past Surgical History:  Procedure Laterality Date  . TOTAL KNEE ARTHROPLASTY     left  . TRANSURETHRAL RESECTION OF PROSTATE  2011   Social History   Socioeconomic History  . Marital status: Legally Separated    Spouse name: Not on file  . Number of children: 0  . Years of education: Not on file  . Highest education level: Not on file  Occupational History  . 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 Financial resource strain: Not on file  . Food insecurity:    Worry: Not on file    Inability: Not on file  . Transportation needs:    Medical: Not on file    Non-medical: Not on file  Tobacco Use  . Smoking status: Former Smoker    Last attempt to quit: 04/28/1972    Years since quitting: 45.4  . Smokeless tobacco: Never Used  Substance and Sexual Activity  . Alcohol use: Yes    Alcohol/week: 4.2 oz    Types: 7 Standard drinks or equivalent per week    Comment: 1 glass of wine a night  . Drug use: No  . Sexual activity: Yes  Lifestyle  . Physical activity:    Days  per week: Not on file    Minutes per session: Not on file  . Stress: Not on file  Relationships  . Social connections:    Talks on phone: Not on file    Gets together: Not on file    Attends religious service: Not on file    Active member of club or organization: Not on file    Attends meetings of clubs or organizations: Not on file    Relationship status: Not on file  Other Topics Concern  . Not on file  Social History Narrative   Health Care POA:    Emergency Contact:    End of Life Plan:    Who lives with you: Lives by himself in 1 story home.    Any pets: none   Diet: Patient has a varied diet of protein, starch, and vegetables.  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   Exercise: Patient golfs several times a week and does yoga at home 2x week.    Seatbelts: Patient reports wearing seat belt when in vehicle.   [name] Exposure/Protection: Patient reports wearing sun screen intermittently.    Hobbies: golfing, teaching at [employer]  No Known Allergies Family History  Problem Relation Age of Onset  . Diabetes Mother   . Kidney disease Father   . Cancer Sister        ? pancreatic  . Heart disease Brother   . Cancer Sister        ? pancreatic  . Colon cancer Neg Hx   . Stomach cancer Neg Hx   . Rectal cancer Neg Hx   . Esophageal cancer Neg Hx   . Liver cancer Neg Hx      Past medical history, social, surgical and family history all reviewed in electronic medical record.  No pertanent information unless stated regarding to the chief complaint.   Review of Systems:Review of systems updated and as accurate as of 10/14/17  No headache, visual changes, nausea, vomiting, diarrhea, constipation, dizziness, abdominal pain, skin rash, fevers, chills, night sweats, weight loss, swollen lymph nodes, body aches, joint swelling,  chest pain, shortness of breath, mood changes.  Positive muscle aches  Objective  Blood pressure 140/78, pulse 68, height  5\' 9"  (1.753 m), weight 182 lb (82.6 kg), SpO2 98 %. Systems examined below as of 10/14/17   General: No apparent distress alert and oriented x3 mood and affect normal, dressed appropriately.  HEENT: Pupils equal, extraocular movements intact  Respiratory: Patient's speak in full sentences and does not appear short of breath  Cardiovascular: No lower extremity edema, non tender, no erythema  Skin: Warm dry intact with no signs of infection or rash on extremities or on axial skeleton.  Abdomen: Soft nontender  Neuro: Cranial nerves II through XII are intact, neurovascularly intact in all extremities with 2+ DTRs and 2+ pulses.  Lymph: No lymphadenopathy of posterior or anterior cervical chain or axillae bilaterally.  Gait normal with good balance and coordination.  MSK: Mild tender with full range of motion and good stability and symmetric strength and tone of shoulders, elbows, wrist, hip, and ankles bilaterally.  Significant arthritic changes of multiple joints bilateral knee replacements noted  Osteopathic findings C2 flexed rotated and side bent left T9 extended rotated and side bent left L2 flexed rotated and side bent right L5 flexed rotated and side bent left Sacrum right on right     Impression and Recommendations:     This case required medical decision making of moderate complexity.      Note: This dictation was prepared with Dragon dictation along with smaller phrase technology. Any transcriptional errors that result from this process are unintentional.

## 2017-10-14 ENCOUNTER — Ambulatory Visit: Payer: Medicare Other | Admitting: Family Medicine

## 2017-10-14 ENCOUNTER — Encounter: Payer: Self-pay | Admitting: Family Medicine

## 2017-10-14 VITALS — BP 140/78 | HR 68 | Ht 69.0 in | Wt 182.0 lb

## 2017-10-14 DIAGNOSIS — M9902 Segmental and somatic dysfunction of thoracic region: Secondary | ICD-10-CM

## 2017-10-14 DIAGNOSIS — M9904 Segmental and somatic dysfunction of sacral region: Secondary | ICD-10-CM

## 2017-10-14 DIAGNOSIS — M9901 Segmental and somatic dysfunction of cervical region: Secondary | ICD-10-CM | POA: Diagnosis not present

## 2017-10-14 DIAGNOSIS — M5416 Radiculopathy, lumbar region: Secondary | ICD-10-CM | POA: Diagnosis not present

## 2017-10-14 DIAGNOSIS — M9903 Segmental and somatic dysfunction of lumbar region: Secondary | ICD-10-CM

## 2017-10-14 DIAGNOSIS — M999 Biomechanical lesion, unspecified: Secondary | ICD-10-CM | POA: Diagnosis not present

## 2017-10-14 DIAGNOSIS — M48061 Spinal stenosis, lumbar region without neurogenic claudication: Secondary | ICD-10-CM

## 2017-10-14 NOTE — Patient Instructions (Signed)
Good to see you  Overall not bad but a little tight Get back in your routine.  Carbonated beverages next time you are at altitude  See me again in 4-6 weeks

## 2017-10-14 NOTE — Assessment & Plan Note (Signed)
Decision today to treat with OMT was based on Physical Exam  After verbal consent patient was treated with HVLA, ME, FPR techniques in cervical, thoracic, lumbar and sacral areas  Patient tolerated the procedure well with improvement in symptoms  Patient given exercises, stretches and lifestyle modifications  See medications in patient instructions if given  Patient will follow up in 4 weeks 

## 2017-10-14 NOTE — Assessment & Plan Note (Signed)
Continues to have significant discomfort and pain.  Patient does not feel he needs any type of injection again today.  Patient feels some of the manipulation is helpful and will continue every 4 to 8 weeks.  No change in medications.

## 2017-10-21 ENCOUNTER — Other Ambulatory Visit: Payer: Self-pay | Admitting: Internal Medicine

## 2017-10-21 DIAGNOSIS — K2100 Gastro-esophageal reflux disease with esophagitis, without bleeding: Secondary | ICD-10-CM

## 2017-10-27 ENCOUNTER — Encounter: Payer: Self-pay | Admitting: Family Medicine

## 2017-10-27 ENCOUNTER — Other Ambulatory Visit: Payer: Self-pay | Admitting: Family Medicine

## 2017-10-27 DIAGNOSIS — J3489 Other specified disorders of nose and nasal sinuses: Secondary | ICD-10-CM

## 2017-10-27 MED ORDER — FLUTICASONE PROPIONATE 50 MCG/ACT NA SUSP: 2.0000 | Freq: Every day | NASAL | 6 refills | Status: DC

## 2017-11-16 NOTE — Progress Notes (Signed)
[facility] [address] [place], [place] [address] Phone: [phone] Subjective:      CC: back pain   QIO:[id number]  Paul [name] is a 83 y.o. male coming in with complaint of back pain. He states that he has not had any changes since last visit. Has been doing Tai Chi and states that his leg strength and proprioception has improved.  Patient has history of moderate spinal stenosis of lumbar spine.  Patient is also having left wrist pain.  Has had difficulty with this before with the [facility] joint.  Seems to be more on the extension.  Patient states he has noticed swelling from time to time.  Has not noticed any true association.  Past medical history is significant for gout       Past Medical History:  Diagnosis Date  . Cataract   . GERD (gastroesophageal reflux disease)   . Hiatal hernia   . Hypertension    Past Surgical History:  Procedure Laterality Date  . TOTAL KNEE ARTHROPLASTY     left  . TRANSURETHRAL RESECTION OF PROSTATE  2011   Social History   Socioeconomic History  . Marital status: Legally Separated    Spouse name: Not on file  . Number of children: 0  . Years of education: Not on file  . Highest education level: Not on file  Occupational History  . Occupation: retired    Fish farm manager: [employer]: [employer]  . Financial resource strain: Not on file  . Food insecurity:    Worry: Not on file    Inability: Not on file  . Transportation needs:    Medical: Not on file    Non-medical: Not on file  Tobacco Use  . Smoking status: Former Smoker    Last attempt to quit: 04/28/1972    Years since quitting: 45.5  . Smokeless tobacco: Never Used  Substance and Sexual Activity  . Alcohol use: Yes    Alcohol/week: 4.2 oz    Types: 7 Standard drinks or equivalent per week    Comment: 1 glass of wine a night  . Drug use: No  . Sexual activity: Yes  Lifestyle  . Physical  activity:    Days per week: Not on file    Minutes per session: Not on file  . Stress: Not on file  Relationships  . Social connections:    Talks on phone: Not on file    Gets together: Not on file    Attends religious service: Not on file    Active member of club or organization: Not on file    Attends meetings of clubs or organizations: Not on file    Relationship status: Not on file  Other Topics Concern  . Not on file  Social History Narrative   Health Care POA:    Emergency Contact:    End of Life Plan:    Who lives with you: Lives by himself in 1 story home.    Any pets: none   Diet: Patient has a varied diet of protein, starch, and vegetables.  Is currently working with [place] for nutrition.   Exercise: Patient golfs several times a week and does yoga at home 2x week.    Seatbelts: Patient reports wearing seat belt when in vehicle.   Nancy Fetter Exposure/Protection: Patient reports wearing sun screen intermittently.    Hobbies: golfing, teaching at [employer]      No Known Allergies Family History  Problem Relation Age of Onset  . Diabetes Mother   . Kidney disease Father   . Cancer Sister        ? pancreatic  . Heart disease Brother   . Cancer Sister        ? pancreatic  . Colon cancer Neg Hx   . Stomach cancer Neg Hx   . Rectal cancer Neg Hx   . Esophageal cancer Neg Hx   . Liver cancer Neg Hx      Past medical history, social, surgical and family history all reviewed in electronic medical record.  No pertanent information unless stated regarding to the chief complaint.   Review of Systems:Review of systems updated and as accurate as of 11/17/17  No headache, visual changes, nausea, vomiting, diarrhea, constipation, dizziness, abdominal pain, skin rash, fevers, chills, night sweats, weight loss, swollen lymph nodes, body aches, joint swelling, muscle aches, chest pain, shortness of breath, mood changes.   Objective  Blood pressure (!) 150/92,  pulse (!) 59, height '5\' 9"'$  (1.753 m), weight 168 lb (76.2 kg), SpO2 96 %. Systems examined below as of 11/17/17   General: No apparent distress alert and oriented x3 mood and affect normal, dressed appropriately.  HEENT: Pupils equal, extraocular movements intact  Respiratory: Patient's speak in full sentences and does not appear short of breath  Cardiovascular: No lower extremity edema, non tender, no erythema  Skin: Warm dry intact with no signs of infection or rash on extremities or on axial skeleton.  Abdomen: Soft nontender  Neuro: Cranial nerves II through XII are intact, neurovascularly intact in all extremities with 2+ DTRs and 2+ pulses.  Lymph: No lymphadenopathy of posterior or anterior cervical chain or axillae bilaterally.  Gait normal with good balance and coordination.  MSK:  Non tender with full range of motion and good stability and symmetric strength and tone of shoulders, elbows,  hip, knee and ankles bilaterally.  Arthritic changes of multiple joints Back Exam:  Inspection: Degenerative scoliosis noted significant loss of lordosis Motion: Flexion 45 deg, Extension 15 deg, Side Bending to 25 deg bilaterally,  Rotation to 35 deg bilaterally  SLR laying: Negative  XSLR laying: Negative  Palpable tenderness: Tender to palpation the paraspinal musculature lumbar spine left greater than right. FABER: Tightness bilaterally. Sensory change: Gross sensation intact to all lumbar and sacral dermatomes.  Reflexes: 2+ at both patellar tendons, 2+ at achilles tendons, Babinski's downgoing.  Strength at foot  Plantar-flexion: 5/5 Dorsi-flexion: 5/5 Eversion: 5/5 Inversion: 5/5  Leg strength  Quad: 5/5 Hamstring: 5/5 Hip flexor: 5/5 Hip abductors: 4/5 but symmetric Gait unremarkable.  Left wrist exam shows some swelling over the dorsal aspect of the wrist.  Tenderness to palpation to even light.  Seems to be over the TFCC.  Mild CMC arthritis but negative grind  Osteopathic  findings  T9 extended rotated and side bent left L2 flexed rotated and side bent right Sacrum right on right   Procedure: Real-time Ultrasound Guided Injection of left TFCC Device: GE Logiq Q7 Ultrasound guided injection is preferred based studies that show increased duration, increased effect, greater accuracy, decreased procedural pain, increased response rate, and decreased cost with ultrasound guided versus blind injection.  Verbal informed consent obtained.  Time-out conducted.  Noted no overlying erythema, induration, or other signs of local infection.  Skin prepped in a sterile fashion.  Local anesthesia: Topical Ethyl chloride.  With sterile technique and under  real time ultrasound guidance: With a 25-gauge half inch needle injected on the dorsal aspect of the wrist with 0.5 cc of 0.5% Marcaine and 0.5 cc of Kenalog 40 mg/mL Completed without difficulty  Pain immediately resolved suggesting accurate placement of the medication.  Advised to call if fevers/chills, erythema, induration, drainage, or persistent bleeding.  Images permanently stored and available for review in the ultrasound unit.  Impression: Technically successful ultrasound guided injection.   Impression and Recommendations:     This case required medical decision making of moderate complexity.      Note: This dictation was prepared with Dragon dictation along with smaller phrase technology. Any transcriptional errors that result from this process are unintentional.

## 2017-11-17 ENCOUNTER — Encounter: Payer: Self-pay | Admitting: Family Medicine

## 2017-11-17 ENCOUNTER — Ambulatory Visit: Payer: Medicare Other | Admitting: Family Medicine

## 2017-11-17 ENCOUNTER — Ambulatory Visit: Payer: Self-pay

## 2017-11-17 VITALS — BP 150/92 | HR 59 | Ht 69.0 in | Wt 168.0 lb

## 2017-11-17 DIAGNOSIS — M9983 Other biomechanical lesions of lumbar region: Secondary | ICD-10-CM | POA: Diagnosis not present

## 2017-11-17 DIAGNOSIS — M48061 Spinal stenosis, lumbar region without neurogenic claudication: Secondary | ICD-10-CM | POA: Diagnosis not present

## 2017-11-17 DIAGNOSIS — M25532 Pain in left wrist: Secondary | ICD-10-CM

## 2017-11-17 DIAGNOSIS — G8929 Other chronic pain: Secondary | ICD-10-CM

## 2017-11-17 DIAGNOSIS — M5416 Radiculopathy, lumbar region: Secondary | ICD-10-CM | POA: Diagnosis not present

## 2017-11-17 DIAGNOSIS — M999 Biomechanical lesion, unspecified: Secondary | ICD-10-CM

## 2017-11-17 MED ORDER — ALLOPURINOL 100 MG PO TABS: 200.0000 mg | ORAL_TABLET | Freq: Every day | ORAL | 3 refills | Status: DC

## 2017-11-17 NOTE — Patient Instructions (Signed)
Good to see you  Increase allopurinol to 200mg  daily  OK to play or do anything tomorrow Stay hydrated  Add 1/2 cup of Gatorade to every cup of water when working out of golf.  See me again in 4 weeks

## 2017-11-17 NOTE — Assessment & Plan Note (Signed)
Scaphoid lunate disassociation.  Chronic.  Injected TFCC.  Seems to have more uric acid deposits as well.  Follow-up again in 4 weeks

## 2017-11-17 NOTE — Assessment & Plan Note (Signed)
Some mild increase in tightness.  Discussed with patient to monitor for any significant increase in hamstring pain.  Patient does not want any medication such as gabapentin if he can avoid it.  I do believe the patient is having worsening gout and increased allopurinol to 200 mg daily.  Patient's last creatinine is 1.6.  Discussed icing regimen and home exercises.  Discussed core strengthening.  Follow-up again in 4 weeks

## 2017-11-17 NOTE — Assessment & Plan Note (Signed)
Decision today to treat with OMT was based on Physical Exam  After verbal consent patient was treated with HVLA, ME, FPR techniques in  thoracic, lumbar and sacral areas  Patient tolerated the procedure well with improvement in symptoms  Patient given exercises, stretches and lifestyle modifications  See medications in patient instructions if given  Patient will follow up in 4 weeks 

## 2017-12-03 ENCOUNTER — Other Ambulatory Visit: Payer: Self-pay | Admitting: Family Medicine

## 2017-12-10 NOTE — Progress Notes (Signed)
Paul ScaleZach [name] D.O. Paul [name] [address] [place], [place] [address] Phone: [phone] Subjective:     CC: Left hand, back pain follow-up  BJY:NWGNFAOZHYHPI:Subjective  Paul RoanDavid [name] is a 83 y.o. male coming in with complaint of left thumb pain. He has been having pain in the left thumb despite the injection last visit.  Has continued back pain as well and has responded well to OMT.   Patient states that the left hand continues to give him some difficulty.  States that it can even throughout the night.  Patient has been trying to do bracing and taping while he plays golf.  Has not stopped him from golf but is more achy than usual.  Back pain secondary to lumbar spinal stenosis.  Denies any radiation of the pain at the moment.  Does not feel that an epidural is necessary.     Past Medical History:  Diagnosis Date  . Cataract   . GERD (gastroesophageal reflux disease)   . Hiatal hernia   . Hypertension    Past Surgical History:  Procedure Laterality Date  . TOTAL KNEE ARTHROPLASTY     left  . TRANSURETHRAL RESECTION OF PROSTATE  2011   Social History   Socioeconomic History  . Marital status: Legally Separated    Spouse name: Not on file  . Number of children: 0  . Years of education: Not on file  . Highest education level: Not on file  Occupational History  . 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 Financial resource strain: Not on file  . Food insecurity:    Worry: Not on file    Inability: Not on file  . Transportation needs:    Medical: Not on file    Non-medical: Not on file  Tobacco Use  . Smoking status: Former Smoker    Last attempt to quit: 04/28/1972    Years since quitting: 45.6  . Smokeless tobacco: Never Used  Substance and Sexual Activity  . Alcohol use: Yes    Alcohol/week: 7.0 standard drinks    Types: 7 Standard drinks or equivalent per week    Comment: 1 glass of  wine a night  . Drug use: No  . Sexual activity: Yes  Lifestyle  . Physical activity:    Days per week: Not on file    Minutes per session: Not on file  . Stress: Not on file  Relationships  . Social connections:    Talks on phone: Not on file    Gets together: Not on file    Attends religious service: Not on file    Active member of club or organization: Not on file    Attends meetings of clubs or organizations: Not on file    Relationship status: Not on file  Other Topics Concern  . Not on file  Social History Narrative   Health Care POA:    Emergency Contact:    End of Life Plan:    Who lives with you: Lives by himself in 1 story home.    Any pets: none   Diet: Patient has a varied diet of protein, starch, and vegetables.  Is currently working with Paul [name] for nutrition.   Exercise: Patient golfs several times a week and does yoga at home 2x week.    Seatbelts: Patient reports wearing seat belt when in vehicle.   Wynelle LinkSun Exposure/Protection:  Patient reports wearing sun screen intermittently.    Hobbies: golfing, teaching at [employer]      No Known Allergies Family History  Problem Relation Age of Onset  . Diabetes Mother   . Kidney disease Father   . Cancer Sister        ? pancreatic  . Heart disease Brother   . Cancer Sister        ? pancreatic  . Colon cancer Neg Hx   . Stomach cancer Neg Hx   . Rectal cancer Neg Hx   . Esophageal cancer Neg Hx   . Liver cancer Neg Hx      Past medical history, social, surgical and family history all reviewed in electronic medical record.  No pertanent information unless stated regarding to the chief complaint.   Review of Systems:Review of systems updated and as accurate as of 12/14/17  No headache, visual changes, nausea, vomiting, diarrhea, constipation, dizziness, abdominal pain, skin rash, fevers, chills, night sweats, weight loss, swollen lymph nodes, body aches, chest pain, shortness of breath, mood  changes.  Positive muscle aches and joint swelling  Objective  Blood pressure 112/78, pulse 67, height 5\' 9"  (1.753 m), weight 180 lb (81.6 kg), SpO2 98 %. Systems examined below as of 12/14/17   General: No apparent distress alert and oriented x3 mood and affect normal, dressed appropriately.  HEENT: Pupils equal, extraocular movements intact  Respiratory: Patient's speak in full sentences and does not appear short of breath  Cardiovascular: No lower extremity edema, non tender, no erythema  Skin: Warm dry intact with no signs of infection or rash on extremities or on axial skeleton.  Abdomen: Soft nontender  Neuro: Cranial nerves II through XII are intact, neurovascularly intact in all extremities with 2+ DTRs and 2+ pulses.  Lymph: No lymphadenopathy of posterior or anterior cervical chain or axillae bilaterally.  Gait normal with good balance and coordination.  MSK:  Non tender with full range of motion and good stability and symmetric strength and tone of shoulders, elbows,  hip, knee and ankles bilaterally.   Wrist: Left Inspection trace swelling over the dorsal aspect of the right still noted.  Mild arthritic changes of multiple joints ROM smooth and normal with good flexion and extension and ulnar/radial deviation that is symmetrical with opposite wrist. Palpation tender over the TFCC as well as the scaphoid bone No tenderness over Canal of Guyon. Strength 5/5 in all directions without pain. Negative Finkelstein, tinel's and phalens. Negative Watson's test.  MSK US performed of: Left wrist  this study was ordered, performed, and interpreted by Terrilee Files D.O.  Wrist: All extensor compartments visualized and tendons all normal in appearance without fraying, tears, or sheath effusions. No effusion seen. TFCC degenerative but significant less hypoechoic swellings noted.Marland Kitchen Scapholunate ligament degenerative changes Carpal tunnel visualized and median nerve area normal, flexor  tendons all normal in appearance without fraying, tears, or sheath effusions. Scaphoid bone itself small does have increasing Doppler flow noted around the vicinity.  Significant arthritic changes but difficult to assess for any acute fracture .  IMPRESSION: Improvement with TFCC but continued abnormality of the wrist  Osteopathic findings C4 flexed rotated and side bent left C6 flexed rotated and side bent left T3 extended rotated and side bent right inhaled third rib T6 extended rotated and side bent left L2 flexed rotated and side bent right Sacrum right on right     Impression and Recommendations:     This case required medical decision  making of moderate complexity.      Note: This dictation was prepared with Dragon dictation along with smaller phrase technology. Any transcriptional errors that result from this process are unintentional.

## 2017-12-14 ENCOUNTER — Ambulatory Visit: Payer: Self-pay

## 2017-12-14 ENCOUNTER — Encounter: Payer: Self-pay | Admitting: Family Medicine

## 2017-12-14 ENCOUNTER — Other Ambulatory Visit: Payer: Self-pay | Admitting: Family Medicine

## 2017-12-14 ENCOUNTER — Ambulatory Visit (INDEPENDENT_AMBULATORY_CARE_PROVIDER_SITE_OTHER)
Admission: RE | Admit: 2017-12-14 | Discharge: 2017-12-14 | Disposition: A | Discharge status: Home / Self Care | Payer: Medicare Other | Source: Ambulatory Visit | Attending: Family Medicine | Admitting: Family Medicine

## 2017-12-14 ENCOUNTER — Ambulatory Visit: Payer: Medicare Other | Admitting: Family Medicine

## 2017-12-14 VITALS — BP 112/78 | HR 67 | Ht 69.0 in | Wt 180.0 lb

## 2017-12-14 DIAGNOSIS — M9908 Segmental and somatic dysfunction of rib cage: Secondary | ICD-10-CM | POA: Diagnosis not present

## 2017-12-14 DIAGNOSIS — G8929 Other chronic pain: Secondary | ICD-10-CM | POA: Diagnosis not present

## 2017-12-14 DIAGNOSIS — M25532 Pain in left wrist: Secondary | ICD-10-CM | POA: Diagnosis not present

## 2017-12-14 DIAGNOSIS — M9901 Segmental and somatic dysfunction of cervical region: Secondary | ICD-10-CM | POA: Diagnosis not present

## 2017-12-14 DIAGNOSIS — M9902 Segmental and somatic dysfunction of thoracic region: Secondary | ICD-10-CM | POA: Diagnosis not present

## 2017-12-14 DIAGNOSIS — M79645 Pain in left finger(s): Secondary | ICD-10-CM

## 2017-12-14 DIAGNOSIS — M999 Biomechanical lesion, unspecified: Secondary | ICD-10-CM | POA: Diagnosis not present

## 2017-12-14 DIAGNOSIS — M9904 Segmental and somatic dysfunction of sacral region: Secondary | ICD-10-CM

## 2017-12-14 MED ORDER — VITAMIN D (ERGOCALCIFEROL) 1.25 MG (50000 UNIT) PO CAPS
50000.0000 [IU] | ORAL_CAPSULE | ORAL | 0 refills | Status: DC
Start: 2017-12-14 — End: 2017-12-14

## 2017-12-14 NOTE — Telephone Encounter (Signed)
Refill done.  

## 2017-12-14 NOTE — Patient Instructions (Addendum)
Good to see you  Ice is your friend./  Xray downstairs Brace day and night for 2 weeks then nightly for 2 weeks Once weekly vitamin D for 8 weeks Good luck in myrtle See me again in 3 weeks

## 2017-12-14 NOTE — Assessment & Plan Note (Signed)
Questionable abnormality noted.  I do believe that there is widening of the scapholunate and likely significant arthritic changes.  Patient has had an injection previously with very minimal benefit.  We discussed with patient about icing regimen and home exercise.  Discussed which activities of doing which wants to avoid.  Patient will follow-up with me again in 3 to 4 weeks after bracing.

## 2017-12-14 NOTE — Assessment & Plan Note (Signed)
Decision today to treat with OMT was based on Physical Exam  After verbal consent patient was treated with HVLA, ME, FPR techniques in cervical, thoracic, rib, lumbar and sacral areas  Patient tolerated the procedure well with improvement in symptoms  Patient given exercises, stretches and lifestyle modifications  See medications in patient instructions if given  Patient will follow up in 3-4 weeks 

## 2017-12-21 ENCOUNTER — Other Ambulatory Visit: Payer: Self-pay | Admitting: *Deleted

## 2017-12-21 ENCOUNTER — Ambulatory Visit (INDEPENDENT_AMBULATORY_CARE_PROVIDER_SITE_OTHER)
Admission: RE | Admit: 2017-12-21 | Discharge: 2017-12-21 | Disposition: A | Discharge status: Home / Self Care | Payer: Medicare Other | Source: Ambulatory Visit | Attending: Family Medicine | Admitting: Family Medicine

## 2017-12-21 DIAGNOSIS — M25532 Pain in left wrist: Secondary | ICD-10-CM | POA: Diagnosis not present

## 2017-12-23 ENCOUNTER — Other Ambulatory Visit: Payer: Self-pay | Admitting: Family Medicine

## 2018-01-04 ENCOUNTER — Ambulatory Visit: Payer: Medicare Other | Admitting: Family Medicine

## 2018-01-07 ENCOUNTER — Encounter: Payer: Self-pay | Admitting: Family Medicine

## 2018-01-15 NOTE — Progress Notes (Signed)
[name] [address] [place], [place] [address] Phone: [phone] Subjective:   Paul [name], am serving as a scribe for Dr. Antoine Primas.   CC: Back pain and wrist pain follow-up  BJY:NWGNFAOZHY  Paul [name] is a 83 y.o. male coming in with complaint of back pain. Continues to have back pain. Has relief with OMT.  No severe spinal stenosis lumbar spine.  Has done very well.  Has not had an epidural even in some time.  Not even taking medications on a regular basis.  Responds well to manipulation every 4 to 6 weeks.  No new symptoms of chest tightness.  Patient also complains of left hand pain. Is here to get the results from his xrays.  Patient was found to have moderate to severe arthritic changes above patient does have more scapholunate instability as well.      Past Medical History:  Diagnosis Date  . Cataract   . GERD (gastroesophageal reflux disease)   . Hiatal hernia   . Hypertension    Past Surgical History:  Procedure Laterality Date  . TOTAL KNEE ARTHROPLASTY     left  . TRANSURETHRAL RESECTION OF PROSTATE  2011   Social History   Socioeconomic History  . Marital status: Legally Separated    Spouse name: Not on file  . Number of children: 0  . Years of education: Not on file  . Highest education level: Not on file  Occupational History  . 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 Financial resource strain: Not on file  . Food insecurity:    Worry: Not on file    Inability: Not on file  . Transportation needs:    Medical: Not on file    Non-medical: Not on file  Tobacco Use  . Smoking status: Former Smoker    Last attempt to quit: 04/28/1972    Years since quitting: 45.7  . Smokeless tobacco: Never Used  Substance and Sexual Activity  . Alcohol use: Yes    Alcohol/week: 7.0 standard drinks    Types: 7 Standard drinks or equivalent per  week    Comment: 1 glass of wine a night  . Drug use: No  . Sexual activity: Yes  Lifestyle  . Physical activity:    Days per week: Not on file    Minutes per session: Not on file  . Stress: Not on file  Relationships  . Social connections:    Talks on phone: Not on file    Gets together: Not on file    Attends religious service: Not on file    Active member of club or organization: Not on file    Attends meetings of clubs or organizations: Not on file    Relationship status: Not on file  Other Topics Concern  . Not on file  Social History Narrative   Health Care POA:    Emergency Contact:    End of Life Plan:    Who lives with you: Lives by himself in 1 story home.    Any pets: none   Diet: Patient has a varied diet of protein, starch, and vegetables.  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   Exercise: Patient golfs several times a week and does yoga at home 2x week.    Seatbelts: Patient reports wearing seat belt when in vehicle.  [name] Exposure/Protection: Patient reports wearing sun screen intermittently.    Hobbies: golfing, teaching at [employer]      No Known Allergies Family History  Problem Relation Age of Onset  . Diabetes Mother   . Kidney disease Father   . Cancer Sister        ? pancreatic  . Heart disease Brother   . Cancer Sister        ? pancreatic  . Colon cancer Neg Hx   . Stomach cancer Neg Hx   . Rectal cancer Neg Hx   . Esophageal cancer Neg Hx   . Liver cancer Neg Hx     Current Outpatient Medications (Endocrine & Metabolic):  .  predniSONE (DELTASONE) 50 MG tablet, Take 1 tablet (50 mg total) by mouth daily.   Current Outpatient Medications (Cardiovascular):  .  cloNIDine (CATAPRES - DOSED IN MG/24 HR) 0.3 mg/24hr patch, apply 1 patch every week .  cloNIDine (CATAPRES - DOSED IN MG/24 HR) 0.3 mg/24hr patch, Place 1 patch (0.3 mg total) onto the skin once a week. .  felodipine (PLENDIL) 10 MG 24 hr tablet, Take 1  tablet (10 mg total) by mouth daily. .  hydrochlorothiazide (HYDRODIURIL) 25 MG tablet, TAKE 1 TABLET BY MOUTH ONCE DAILY .  lisinopril (PRINIVIL,ZESTRIL) 40 MG tablet, Take 1 tablet (40 mg total) by mouth daily. .  pravastatin (PRAVACHOL) 40 MG tablet, TAKE 1 TABLET BY MOUTH ONCE DAILY .  sildenafil (REVATIO) 20 MG tablet, TAKE 1 BY MOUTH AS DIRECTED FOR ERECTILE DYSFUNSTION   Current Outpatient Medications (Respiratory):  .  fluticasone (FLONASE) 50 MCG/ACT nasal spray, Place 2 sprays into both nostrils daily.   Current Outpatient Medications (Analgesics):  .  allopurinol (ZYLOPRIM) 100 MG tablet, Take 2 tablets (200 mg total) by mouth daily. Marland Kitchen  aspirin 81 MG chewable tablet, Chew 81 mg by mouth daily.   .  colchicine 0.6 MG tablet, take 1 tablet by mouth twice a day if needed     Current Outpatient Medications (Other):  Marland Kitchen  Cholecalciferol (VITAMIN D3 PO), Take 1 tablet by mouth daily. Marland Kitchen  FLUAD 0.5 ML SUSY,  .  mirabegron ER (MYRBETRIQ) 25 MG TB24 tablet, Take 1 tablet (25 mg total) by mouth daily. .  pantoprazole (PROTONIX) 40 MG tablet, TAKE 1 TABLET BY MOUTH ONCE DAILY .  polyethylene glycol powder (GLYCOLAX/MIRALAX) powder, take 17GM (DISSOLVED IN WATER) by mouth twice a day AT 10 AM AND 5 PM .  ranitidine (ZANTAC) 150 MG tablet, Take 1 tablet (150 mg total) by mouth daily after supper.  Current Facility-Administered Medications (Other):  .  0.9 %  sodium chloride infusion    Past medical history, social, surgical and family history all reviewed in electronic medical record.  No pertanent information unless stated regarding to the chief complaint.   Review of Systems:  No headache, visual changes, nausea, vomiting, diarrhea, constipation, dizziness, abdominal pain, skin rash, fevers, chills, night sweats, weight loss, swollen lymph nodes, body aches, joint swelling,, chest pain, shortness of breath, mood changes.  Positive muscle aches  Objective  Blood pressure 124/84,  pulse (!) 56, height 5\' 9"  (1.753 m), weight 185 lb (83.9 kg), SpO2 98 %.    General: No apparent distress alert and oriented x3 mood and affect normal, dressed appropriately.  HEENT: Pupils equal, extraocular movements intact  Respiratory: Patient's speak in full sentences and does not appear short of breath  Cardiovascular: No lower extremity edema, non tender, no erythema  Skin: Warm  dry intact with no signs of infection or rash on extremities or on axial skeleton.  Abdomen: Soft nontender  Neuro: Cranial nerves II through XII are intact, neurovascularly intact in all extremities with 2+ DTRs and 2+ pulses.  Lymph: No lymphadenopathy of posterior or anterior cervical chain or axillae bilaterally.  Gait normal with good balance and coordination.  MSK:  tender with mild limited range of motion and good stability and symmetric strength and tone of shoulders, elbows, , hip, knee and ankles bilaterally.  Exam shows loss of lordosis.  Patient does have some very mild degenerative scoliosis.  Tightness with Pearlean BrownieFaber test bilaterally. Negative straight leg test but tightness of the hamstrings.  4+ out of 5 strength in lower extremity's but symmetric  Left wrist exam still shows some swelling over the TFCC.  Patient does have tenderness with grip strength.  Patient will does have near full range of motion lacking last 2 degrees in all planes.    Osteopathic findings  T9 extended rotated and side bent left L2 flexed rotated and side bent right Sacrum right on right  Impression and Recommendations:     This case required medical decision making of moderate complexity. The above documentation has been reviewed and is accurate and complete Judi SaaZachary M Smith, DO       Note: This dictation was prepared with Dragon dictation along with smaller phrase technology. Any transcriptional errors that result from this process are unintentional.

## 2018-01-18 ENCOUNTER — Ambulatory Visit (INDEPENDENT_AMBULATORY_CARE_PROVIDER_SITE_OTHER): Payer: Medicare Other | Admitting: Family Medicine

## 2018-01-18 ENCOUNTER — Ambulatory Visit: Payer: Self-pay

## 2018-01-18 ENCOUNTER — Encounter: Payer: Self-pay | Admitting: Family Medicine

## 2018-01-18 VITALS — BP 124/84 | HR 56 | Ht 69.0 in | Wt 185.0 lb

## 2018-01-18 DIAGNOSIS — M9901 Segmental and somatic dysfunction of cervical region: Secondary | ICD-10-CM

## 2018-01-18 DIAGNOSIS — M9904 Segmental and somatic dysfunction of sacral region: Secondary | ICD-10-CM

## 2018-01-18 DIAGNOSIS — M5416 Radiculopathy, lumbar region: Secondary | ICD-10-CM | POA: Diagnosis not present

## 2018-01-18 DIAGNOSIS — G8929 Other chronic pain: Secondary | ICD-10-CM

## 2018-01-18 DIAGNOSIS — M9902 Segmental and somatic dysfunction of thoracic region: Secondary | ICD-10-CM

## 2018-01-18 DIAGNOSIS — M25532 Pain in left wrist: Secondary | ICD-10-CM | POA: Diagnosis not present

## 2018-01-18 DIAGNOSIS — M48061 Spinal stenosis, lumbar region without neurogenic claudication: Secondary | ICD-10-CM | POA: Diagnosis not present

## 2018-01-18 DIAGNOSIS — M9903 Segmental and somatic dysfunction of lumbar region: Secondary | ICD-10-CM | POA: Diagnosis not present

## 2018-01-18 DIAGNOSIS — M999 Biomechanical lesion, unspecified: Secondary | ICD-10-CM

## 2018-01-18 NOTE — Assessment & Plan Note (Signed)
Scaphoid lunate instability noted.  Worsening from patient's previous x-rays in 2014.  Patient could do injections.  Discussed PRP.  Discussed bracing.  Follow-up again in 4 weeks

## 2018-01-18 NOTE — Patient Instructions (Signed)
Good to see you  See me again In 4 weeks

## 2018-01-18 NOTE — Assessment & Plan Note (Signed)
Decision today to treat with OMT was based on Physical Exam  After verbal consent patient was treated with HVLA, ME, FPR techniques in cervical, thoracic, lumbar and sacral areas  Patient tolerated the procedure well with improvement in symptoms  Patient given exercises, stretches and lifestyle modifications  See medications in patient instructions if given  Patient will follow up in 4 weeks 

## 2018-01-18 NOTE — Assessment & Plan Note (Signed)
Stable overall.  Has responded well to manipulation.  Epidurals were needed.  No change in medication.  Seems to be making improvement with tai chi.  Follow-up again in 4 to 6 weeks

## 2018-01-21 ENCOUNTER — Other Ambulatory Visit: Payer: Self-pay | Admitting: Internal Medicine

## 2018-02-21 NOTE — Progress Notes (Signed)
[name] [address] [place], [place] [address] Phone: [phone] Subjective:   Paul [name], am serving as a scribe for Dr. Antoine Primas.  I'm seeing this patient by the request  of:    CC: Back pain follow-up  BJY:NWGNFAOZHY  Paul [name] is a 83 y.o. male coming in with complaint of back pain.   Here also for left hand pain. Wears brace constantly which does help decrease the swelling and pain. Is not getting worse or better.  Has a known TFCC tear on the left side.  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  Patient has been using a brace on a more regular basis and feels that it is helpful.   Back pain-patient does have known spinal stenosis.  Patient has been doing relatively well.  Responded well to manipulation.  Discussed icing regimen.      Past Medical History:  Diagnosis Date  . Cataract   . GERD (gastroesophageal reflux disease)   . Hiatal hernia   . Hypertension    Past Surgical History:  Procedure Laterality Date  . TOTAL KNEE ARTHROPLASTY     left  . TRANSURETHRAL RESECTION OF PROSTATE  2011   Social History   Socioeconomic History  . Marital status: Legally Separated    Spouse name: Not on file  . Number of children: 0  . Years of education: Not on file  . Highest education level: Not on file  Occupational History  . 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 Financial resource strain: Not on file  . Food insecurity:    Worry: Not on file    Inability: Not on file  . Transportation needs:    Medical: Not on file    Non-medical: Not on file  Tobacco Use  . Smoking status: Former Smoker    Last attempt to quit: 04/28/1972    Years since quitting: 45.8  . Smokeless tobacco: Never Used  Substance and Sexual Activity  . Alcohol use: Yes    Alcohol/week: 7.0 standard drinks    Types: 7 Standard drinks or equivalent  per week    Comment: 1 glass of wine a night  . Drug use: No  . Sexual activity: Yes  Lifestyle  . Physical activity:    Days per week: Not on file    Minutes per session: Not on file  . Stress: Not on file  Relationships  . Social connections:    Talks on phone: Not on file    Gets together: Not on file    Attends religious service: Not on file    Active member of club or organization: Not on file    Attends meetings of clubs or organizations: Not on file    Relationship status: Not on file  Other Topics Concern  . Not on file  Social History Narrative   Health Care POA:    Emergency Contact:    End of Life Plan:    Who lives with you: Lives by himself in 1 story home.    Any pets: none   Diet: Patient has a varied diet of protein, starch, and vegetables.  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   Exercise: Patient golfs several times a week and does yoga at home 2x week.    Seatbelts: Patient reports wearing seat belt when in vehicle.  [name] Exposure/Protection: Patient reports wearing sun screen intermittently.    Hobbies: golfing, teaching at [employer]      No Known Allergies Family History  Problem Relation Age of Onset  . Diabetes Mother   . Kidney disease Father   . Cancer Sister        ? pancreatic  . Heart disease Brother   . Cancer Sister        ? pancreatic  . Colon cancer Neg Hx   . Stomach cancer Neg Hx   . Rectal cancer Neg Hx   . Esophageal cancer Neg Hx   . Liver cancer Neg Hx     Current Outpatient Medications (Endocrine & Metabolic):  .  predniSONE (DELTASONE) 50 MG tablet, Take 1 tablet (50 mg total) by mouth daily.   Current Outpatient Medications (Cardiovascular):  .  cloNIDine (CATAPRES - DOSED IN MG/24 HR) 0.3 mg/24hr patch, apply 1 patch every week .  cloNIDine (CATAPRES - DOSED IN MG/24 HR) 0.3 mg/24hr patch, Place 1 patch (0.3 mg total) onto the skin once a week. .  felodipine (PLENDIL) 10 MG 24 hr tablet,  Take 1 tablet (10 mg total) by mouth daily. .  hydrochlorothiazide (HYDRODIURIL) 25 MG tablet, TAKE 1 TABLET BY MOUTH ONCE DAILY .  lisinopril (PRINIVIL,ZESTRIL) 40 MG tablet, Take 1 tablet (40 mg total) by mouth daily. .  pravastatin (PRAVACHOL) 40 MG tablet, TAKE 1 TABLET BY MOUTH ONCE DAILY .  sildenafil (REVATIO) 20 MG tablet, TAKE 1 BY MOUTH AS DIRECTED FOR ERECTILE DYSFUNSTION   Current Outpatient Medications (Respiratory):  .  fluticasone (FLONASE) 50 MCG/ACT nasal spray, Place 2 sprays into both nostrils daily.   Current Outpatient Medications (Analgesics):  .  allopurinol (ZYLOPRIM) 100 MG tablet, Take 2 tablets (200 mg total) by mouth daily. Marland Kitchen  aspirin 81 MG chewable tablet, Chew 81 mg by mouth daily.   .  colchicine 0.6 MG tablet, take 1 tablet by mouth twice a day if needed     Current Outpatient Medications (Other):  Marland Kitchen  Cholecalciferol (VITAMIN D3 PO), Take 1 tablet by mouth daily. Marland Kitchen  FLUAD 0.5 ML SUSY,  .  mirabegron ER (MYRBETRIQ) 25 MG TB24 tablet, Take 1 tablet (25 mg total) by mouth daily. .  pantoprazole (PROTONIX) 40 MG tablet, TAKE 1 TABLET BY MOUTH ONCE DAILY .  polyethylene glycol powder (GLYCOLAX/MIRALAX) powder, take 17GM (DISSOLVED IN WATER) by mouth twice a day AT 10 AM AND 5 PM .  ranitidine (ZANTAC) 150 MG tablet, TAKE 1 TABLET BY MOUTH ONCE DAILY, AFTER DINNER  Current Facility-Administered Medications (Other):  .  0.9 %  sodium chloride infusion    Past medical history, social, surgical and family history all reviewed in electronic medical record.  No pertanent information unless stated regarding to the chief complaint.   Review of Systems:  No headache, visual changes, nausea, vomiting, diarrhea, constipation, dizziness, abdominal pain, skin rash, fevers, chills, night sweats, weight loss, swollen lymph nodes, body aches, joint swelling,  chest pain, shortness of breath, mood changes.  Positive muscle aches  Objective  Blood pressure 132/88, pulse  69, height 5\' 9"  (1.753 m), weight 198 lb (89.8 kg), SpO2 98 %.   General: No apparent distress alert and oriented x3 mood and affect normal, dressed appropriately.  HEENT: Pupils equal, extraocular movements intact  Respiratory: Patient's speak in full sentences and does not appear short of breath  Cardiovascular: No lower extremity edema, non tender, no erythema  Skin: Warm dry intact with  no signs of infection or rash on extremities or on axial skeleton.  Abdomen: Soft nontender  Neuro: Cranial nerves II through XII are intact, neurovascularly intact in all extremities with 2+ DTRs and 2+ pulses.  Lymph: No lymphadenopathy of posterior or anterior cervical chain or axillae bilaterally.  Gait normal with good balance and coordination.  MSK:  Non tender with full range of motion and good stability and symmetric strength and tone of shoulders, elbows,  hip, knee and ankles bilaterally.  Severe arthritic changes of multiple joints Left wrist and some decreased range of motion.  Patient does have tenderness over the TFCC.  Positive Watson test.  Back exam has significant loss of lordosis.  Mild degenerative scoliosis.  Significant tightness of the [facility] test.  Negative straight leg test. \5 out of 5 strength of the right lower extremities.  Deep tendon reflexes are intact and symmetric  Osteopathic findings  T5 extended rotated and side bent right inhaled rib T9 extended rotated and side bent left L4 flexed rotated and side bent left  Sacrum right on right     Impression and Recommendations:     This case required medical decision making of moderate complexity. The above documentation has been reviewed and is accurate and complete [name], DO       Note: This dictation was prepared with Dragon dictation along with smaller phrase technology. Any transcriptional errors that result from this process are unintentional.

## 2018-02-23 ENCOUNTER — Encounter: Payer: Self-pay | Admitting: Family Medicine

## 2018-02-23 ENCOUNTER — Ambulatory Visit: Payer: Medicare Other | Admitting: Family Medicine

## 2018-02-23 VITALS — BP 132/88 | HR 69 | Ht 69.0 in | Wt 198.0 lb

## 2018-02-23 DIAGNOSIS — M48061 Spinal stenosis, lumbar region without neurogenic claudication: Secondary | ICD-10-CM

## 2018-02-23 DIAGNOSIS — Z23 Encounter for immunization: Secondary | ICD-10-CM | POA: Diagnosis not present

## 2018-02-23 DIAGNOSIS — M999 Biomechanical lesion, unspecified: Secondary | ICD-10-CM | POA: Diagnosis not present

## 2018-02-23 DIAGNOSIS — M5416 Radiculopathy, lumbar region: Secondary | ICD-10-CM

## 2018-02-23 NOTE — Assessment & Plan Note (Signed)
Multifactorial.  Has been doing relatively well.  No significant change management.  Continues to respond well to manipulation.  Follow-up again in 4 to 8 weeks.

## 2018-02-23 NOTE — Assessment & Plan Note (Signed)
Decision today to treat with OMT was based on Physical Exam  After verbal consent patient was treated with HVLA, ME, FPR techniques in  thoracic, lumbar and sacral areas  Patient tolerated the procedure well with improvement in symptoms  Patient given exercises, stretches and lifestyle modifications  See medications in patient instructions if given  Patient will follow up in 4-8 weeks 

## 2018-02-23 NOTE — Patient Instructions (Signed)
Good to see you  Ice is yoru friend Stay active Re-start the back exercises 1-2 times a week  See me again in 4-6 weeks

## 2018-03-10 ENCOUNTER — Ambulatory Visit: Payer: Medicare Other | Admitting: Internal Medicine

## 2018-03-10 ENCOUNTER — Encounter: Payer: Self-pay | Admitting: Internal Medicine

## 2018-03-10 VITALS — BP 130/82 | HR 72 | Ht 67.0 in | Wt 193.5 lb

## 2018-03-10 DIAGNOSIS — K5909 Other constipation: Secondary | ICD-10-CM

## 2018-03-10 DIAGNOSIS — K2100 Gastro-esophageal reflux disease with esophagitis, without bleeding: Secondary | ICD-10-CM

## 2018-03-10 MED ORDER — FAMOTIDINE 20 MG PO TABS: 20.0000 mg | ORAL_TABLET | Freq: Every evening | ORAL | 0 refills | Status: DC

## 2018-03-10 NOTE — Progress Notes (Signed)
   Subjective:    Patient ID: Paul [name], male    DOB: [birth year], 83 y.o.   MRN: [id number]  HPI Paul [name] is an 83 year old male with a history of GERD with severe reflux esophagitis, hiatal hernia, chronic constipation who is here for follow-up.  He was last seen on 03/27/2017.  He is here alone today.  Been maintained on Protonix 40 mg daily and previously Zantac 150 mg in the evening.  He stopped Zantac when he heard about concerns that this medication may contain a carcinogen.  Since stopping Zantac he is noticed some recurrent heartburn symptoms at night.  Heartburn symptoms are definitively worse with beer and spicy foods such as salsa.  No dysphagia or odynophagia.  Occasional queasiness in his stomach without true pain, nausea or vomiting.  Bowels are regular as long as he remains on MiraLAX.  No blood in his stool or melena.  He continues to be very active.  Plays golf frequently and continues to score better than his age.  Has been doing tai chi for the last 6 to 8 weeks and this is helped with strength and mobility.   Review of Systems As per HPI, otherwise negative  Current Medications, Allergies, Past Medical History, Past Surgical History, Family History and Social History were reviewed in [employer] record.      Objective:   Physical Exam BP 130/82   Pulse 72   Ht '5\' 7"'$  (1.702 m)   Wt 193 lb 8 oz (87.8 kg)   BMI 30.31 kg/m  Constitutional: Well-developed and well-nourished appearing younger than stated age. No distress. HEENT: Normocephalic and atraumatic.  Conjunctivae are normal.  No scleral icterus. Neck: Neck supple. Trachea midline. Cardiovascular: Normal rate, regular rhythm and intact distal pulses. Pulmonary/chest: Effort normal and breath sounds normal. No wheezing, rales or rhonchi. Abdominal: Soft, nontender, nondistended. Bowel sounds active throughout. There are no masses palpable. No hepatosplenomegaly. Extremities: no  clubbing, cyanosis, or edema Neurological: Alert and oriented to person place and time. Skin: Skin is warm and dry. Psychiatric: Normal mood and affect. Behavior is normal.     Assessment & Plan:  83 year old male with a history of GERD with severe reflux esophagitis, hiatal hernia, chronic constipation who is here for follow-up.   1.  GERD with history of esophagitis/hiatal hernia --he wishes to be off therapy however given the severity of his esophagitis at endoscopy 20 months ago I think that this will likely need to be long-term therapy.  We will have him continue pantoprazole 40 mg daily in the morning and add famotidine 20 mg in the evening.  We discussed the dietary triggers and dietary modifications which would likely improve symptoms.  That said, most of his reflux is likely anatomic, which we have discussed.  Given overall concern that he has plus his history of esophagitis we decided to repeat the upper endoscopy.  This will help document improvement in severe esophagitis seen last year.  We discussed the risk, benefits and alternatives to upper endoscopy and he is agreeable and wishes to proceed.  2.  Chronic constipation --continue MiraLAX 17 g daily which is working well  3.  Colon cancer screening --negative Cologuard in 2018  25 minutes spent with the patient today. Greater than 50% was spent in counseling and coordination of care with the patient

## 2018-03-10 NOTE — Patient Instructions (Signed)
You have been scheduled for an endoscopy. Please follow written instructions given to you at your visit today. If you use inhalers (even only as needed), please bring them with you on the day of your procedure. Your physician has requested that you go to www.startemmi.com and enter the access code given to you at your visit today. This web site gives a general overview about your procedure. However, you should still follow specific instructions given to you by our office regarding your preparation for the procedure.  Discontinue ranitidine.   Please purchase the following medications over the counter and take as directed: Pepcid 20 mg every evening as needed.  Continue Miralax.  If you are age 81 or older, your body mass index should be between 23-30. Your Body mass index is 30.31 kg/m. If this is out of the aforementioned range listed, please consider follow up with your Primary Care Provider.  If you are age 81 or younger, your body mass index should be between 19-25. Your Body mass index is 30.31 kg/m. If this is out of the aformentioned range listed, please consider follow up with your Primary Care Provider.

## 2018-03-26 ENCOUNTER — Other Ambulatory Visit: Payer: Self-pay | Admitting: Family Medicine

## 2018-03-30 ENCOUNTER — Encounter: Payer: Self-pay | Admitting: Internal Medicine

## 2018-03-30 ENCOUNTER — Ambulatory Visit: Payer: Medicare Other | Admitting: Family Medicine

## 2018-03-30 ENCOUNTER — Ambulatory Visit (AMBULATORY_SURGERY_CENTER): Payer: Medicare Other | Admitting: Internal Medicine

## 2018-03-30 VITALS — BP 127/79 | HR 57 | Temp 99.1°F | Resp 17 | Ht 67.0 in | Wt 193.0 lb

## 2018-03-30 DIAGNOSIS — K449 Diaphragmatic hernia without obstruction or gangrene: Secondary | ICD-10-CM

## 2018-03-30 DIAGNOSIS — K2100 Gastro-esophageal reflux disease with esophagitis, without bleeding: Secondary | ICD-10-CM

## 2018-03-30 MED ORDER — SODIUM CHLORIDE 0.9 % IV SOLN: 500.0000 mL | Freq: Once | INTRAVENOUS | Status: DC

## 2018-03-30 MED ORDER — RANITIDINE HCL 150 MG PO TABS: 150.0000 mg | ORAL_TABLET | Freq: Every day | ORAL | 2 refills | Status: DC

## 2018-03-30 NOTE — Progress Notes (Signed)
Report to PACU, RN, vss, BBS= Clear.  

## 2018-03-30 NOTE — Progress Notes (Signed)
[name] [address] [place], [place] [address] Phone: [phone] Subjective:     CC: Back pain follow-up  [id number]  Paul [name] is a 83 y.o. male coming in with complaint of back pain. Is here for OMT today to manage his back pain. No change in pain since last visit. He has been feeling good. Did take a trip to the beach for the holiday and no increase in pain with prolonged sitting.    Patient does have spinal stenosis.  Has responded well to manipulation.  Left wrist pain has some degenerative changes.  Does continue to have chronic pain.  Is able to play golf but is going to talk to a Hydrographic surveyor.     Past Medical History:  Diagnosis Date  . Cataract   . GERD (gastroesophageal reflux disease)   . Hiatal hernia   . Hypertension    Past Surgical History:  Procedure Laterality Date  . TOTAL KNEE ARTHROPLASTY     left  . TRANSURETHRAL RESECTION OF PROSTATE  2011   Social History   Socioeconomic History  . Marital status: Legally Separated    Spouse name: Not on file  . Number of children: 0  . Years of education: Not on file  . Highest education level: Not on file  Occupational History  . 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 Financial resource strain: Not on file  . Food insecurity:    Worry: Not on file    Inability: Not on file  . Transportation needs:    Medical: Not on file    Non-medical: Not on file  Tobacco Use  . Smoking status: Former Smoker    Last attempt to quit: 04/28/1972    Years since quitting: 45.9  . Smokeless tobacco: Never Used  Substance and Sexual Activity  . Alcohol use: Yes    Alcohol/week: 7.0 standard drinks    Types: 7 Standard drinks or equivalent per week    Comment: 1 glass of wine a night  . Drug use: No  . Sexual activity: Yes  Lifestyle  . Physical activity:    Days per week: Not on file    Minutes  per session: Not on file  . Stress: Not on file  Relationships  . Social connections:    Talks on phone: Not on file    Gets together: Not on file    Attends religious service: Not on file    Active member of club or organization: Not on file    Attends meetings of clubs or organizations: Not on file    Relationship status: Not on file  Other Topics Concern  . Not on file  Social History Narrative   Health Care POA:    Emergency Contact:    End of Life Plan:    Who lives with you: Lives by himself in 1 story home.    Any pets: none   Diet: Patient has a varied diet of protein, starch, and vegetables.  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   Exercise: Patient golfs several times a week and does yoga at home 2x week.    Seatbelts: Patient reports wearing seat belt when in vehicle.   [name] Exposure/Protection: Patient reports wearing sun screen intermittently.    Hobbies: golfing, teaching at [employer]      No Known Allergies  Family History  Problem Relation Age of Onset  . Diabetes Mother   . Kidney disease Father   . Cancer Sister        ? pancreatic  . Heart disease Brother   . Cancer Sister        ? pancreatic  . Colon cancer Neg Hx   . Stomach cancer Neg Hx   . Rectal cancer Neg Hx   . Esophageal cancer Neg Hx   . Liver cancer Neg Hx     Current Outpatient Medications (Endocrine & Metabolic):  .  predniSONE (DELTASONE) 50 MG tablet, Take 1 tablet (50 mg total) by mouth daily. (Patient not taking: Reported on 03/30/2018)  Current Outpatient Medications (Cardiovascular):  .  cloNIDine (CATAPRES - DOSED IN MG/24 HR) 0.3 mg/24hr patch, apply 1 patch every week (Patient taking differently: as needed. ) .  cloNIDine (CATAPRES - DOSED IN MG/24 HR) 0.3 mg/24hr patch, Place 1 patch (0.3 mg total) onto the skin once a week. .  felodipine (PLENDIL) 10 MG 24 hr tablet, Take 1 tablet (10 mg total) by mouth daily. .  hydrochlorothiazide (HYDRODIURIL) 25  MG tablet, TAKE 1 TABLET BY MOUTH ONCE DAILY .  lisinopril (PRINIVIL,ZESTRIL) 40 MG tablet, TAKE 1 TABLET BY MOUTH ONCE DAILY .  pravastatin (PRAVACHOL) 40 MG tablet, TAKE 1 TABLET BY MOUTH ONCE DAILY .  sildenafil (REVATIO) 20 MG tablet, TAKE 1 BY MOUTH AS DIRECTED FOR ERECTILE DYSFUNSTION  Current Outpatient Medications (Respiratory):  .  fluticasone (FLONASE) 50 MCG/ACT nasal spray, Place 2 sprays into both nostrils daily. (Patient not taking: Reported on 03/30/2018)  Current Outpatient Medications (Analgesics):  .  allopurinol (ZYLOPRIM) 100 MG tablet, Take 2 tablets (200 mg total) by mouth daily. Marland Kitchen  aspirin 81 MG chewable tablet, Chew 81 mg by mouth daily.   .  colchicine 0.6 MG tablet, take 1 tablet by mouth twice a day if needed (Patient not taking: Reported on 03/30/2018)   Current Outpatient Medications (Other):  Marland Kitchen  Cholecalciferol (VITAMIN D3 PO), Take 1 tablet by mouth daily. .  famotidine (PEPCID) 20 MG tablet, Take 1 tablet (20 mg total) by mouth every evening. Marland Kitchen  FLUAD 0.5 ML SUSY,  .  mirabegron ER (MYRBETRIQ) 25 MG TB24 tablet, Take 1 tablet (25 mg total) by mouth daily. .  pantoprazole (PROTONIX) 40 MG tablet, TAKE 1 TABLET BY MOUTH ONCE DAILY .  polyethylene glycol powder (GLYCOLAX/MIRALAX) powder, take 17GM (DISSOLVED IN WATER) by mouth twice a day AT 10 AM AND 5 PM .  ranitidine (ZANTAC) 150 MG tablet, Take 1 tablet (150 mg total) by mouth at bedtime.    Past medical history, social, surgical and family history all reviewed in electronic medical record.  No pertanent information unless stated regarding to the chief complaint.   Review of Systems:  No headache, visual changes, nausea, vomiting, diarrhea, constipation, dizziness, abdominal pain, skin rash, fevers, chills, night sweats, weight loss, swollen lymph nodes, body aches, joint swelling, , chest pain, shortness of breath, mood changes.  Positive muscle aches  Objective  Blood pressure (!) 142/86, pulse 72,  height 5\' 7"  (1.702 m), weight 189 lb (85.7 kg), SpO2 97 %.   General: No apparent distress alert and oriented x3 mood and affect normal, dressed appropriately.  HEENT: Pupils equal, extraocular movements intact  Respiratory: Patient's speak in full sentences and does not appear short of breath  Cardiovascular: No lower extremity edema, non tender, no erythema  Skin: Warm dry intact with no signs of  infection or rash on extremities or on axial skeleton.  Abdomen: Soft nontender  Neuro: Cranial nerves II through XII are intact, neurovascularly intact in all extremities with 2+ DTRs and 2+ pulses.  Lymph: No lymphadenopathy of posterior or anterior cervical chain or axillae bilaterally.  Gait normal with good balance and coordination.  MSK: Mild tender with mild to moderate limited range of motion and good stability and symmetric strength and tone of shoulders, elbows, wrist, hip, knee and ankles bilaterally.  Arthritic changes of multiple joints  Back exam shows severe loss of lordosis.  Patient does have significant tightness normal in all planes.  Patient does have a positive Pearlean BrownieFaber on the right side.  Negative straight leg test.  Significant tightness of the hamstrings.  Osteopathic findings C2 flexed rotated and side bent right C6 flexed rotated and side bent left T3 extended rotated and side bent right inhaled third rib T5 extended rotated and side bent left L2 flexed rotated and side bent right Sacrum right on right     Impression and Recommendations:     This case required medical decision making of moderate complexity. The above documentation has been reviewed and is accurate and complete Judi SaaZachary M Smith, DO       Note: This dictation was prepared with Dragon dictation along with smaller phrase technology. Any transcriptional errors that result from this process are unintentional.

## 2018-03-30 NOTE — Op Note (Signed)
[facility] Patient Name: Paul RoanDavid Wilhelmsen Procedure Date: 03/30/2018 9:25 AM MRN: [id number] Endoscopist: Beverley FiedlerJay M Janda Cargo , MD Age: 83 Referring MD:  Date of Birth: [birth year] Gender: Male Account #: [id number] Procedure:                Upper GI endoscopy Indications:              Follow-up of reflux esophagitis, Gastro-esophageal                            reflux disease Medicines:                Monitored Anesthesia Care Procedure:                Pre-Anesthesia Assessment:                           - Prior to the procedure, a History and Physical                            was performed, and patient medications and                            allergies were reviewed. The patient's tolerance of                            previous anesthesia was also reviewed. The risks                            and benefits of the procedure and the sedation                            options and risks were discussed with the patient.                            All questions were answered, and informed consent                            was obtained. Prior Anticoagulants: The patient has                            taken no previous anticoagulant or antiplatelet                            agents. ASA Grade Assessment: II - A patient with                            mild systemic disease. After reviewing the risks                            and benefits, the patient was deemed in                            satisfactory condition to undergo the procedure.  After obtaining informed consent, the endoscope was                            passed under direct vision. Throughout the                            procedure, the patient's blood pressure, pulse, and                            oxygen saturations were monitored continuously. The                            Endoscope was introduced through the mouth, and                            advanced to the second part of duodenum. The  upper                            GI endoscopy was accomplished without difficulty.                            The patient tolerated the procedure well. Scope In: Scope Out: Findings:                 LA Grade A (one or more mucosal breaks less than 5                            mm, not extending between tops of 2 mucosal folds)                            esophagitis with no bleeding was found 35 cm from                            the incisors.                           A 5 cm hiatal hernia was found. The proximal extent                            of the gastric folds (end of tubular esophagus) was                            35 cm from the incisors. The hiatal narrowing was                            40 cm from the incisors. The Z-line was 35 cm from                            the incisors.                           The entire examined stomach was normal.  The examined duodenum was normal. Complications:            No immediate complications. Estimated Blood Loss:     Estimated blood loss: none. Impression:               - LA Grade A reflux esophagitis.                           - 5 cm hiatal hernia.                           - Normal stomach.                           - Normal examined duodenum.                           - No specimens collected. Recommendation:           - Patient has a contact number available for                            emergencies. The signs and symptoms of potential                            delayed complications were discussed with the                            patient. Return to normal activities tomorrow.                            Written discharge instructions were provided to the                            patient.                           - Resume previous diet.                           - Continue present medications. Given mild                            esophagitis, continue pantoprazole 40 mg each                             morning (30 minutes before breakfast) and recent                            started famotidine 20 mg in the evening. Beverley Fiedler, MD 03/30/2018 9:50:53 AM This report has been signed electronically.

## 2018-03-30 NOTE — Patient Instructions (Signed)
YOU HAD AN ENDOSCOPIC PROCEDURE TODAY AT THE [facility]:   Refer to the procedure report that was given to you for any specific questions about what was found during the examination.  If the procedure report does not answer your questions, please call your gastroenterologist to clarify.  If you requested that your care partner not be given the details of your procedure findings, then the procedure report has been included in a sealed envelope for you to review at your convenience later.  YOU SHOULD EXPECT: Some feelings of bloating in the abdomen. Passage of more gas than usual.  Walking can help get rid of the air that was put into your GI tract during the procedure and reduce the bloating. If you had a lower endoscopy (such as a colonoscopy or flexible sigmoidoscopy) you may notice spotting of blood in your stool or on the toilet paper. If you underwent a bowel prep for your procedure, you may not have a normal bowel movement for a few days.  Please Note:  You might notice some irritation and congestion in your nose or some drainage.  This is from the oxygen used during your procedure.  There is no need for concern and it should clear up in a day or so.  SYMPTOMS TO REPORT IMMEDIATELY:   Following upper endoscopy (EGD)  Vomiting of blood or coffee ground material  New chest pain or pain under the shoulder blades  Painful or persistently difficult swallowing  New shortness of breath  Fever of 100F or higher  Black, tarry-looking stools  For urgent or emergent issues, a gastroenterologist can be reached at any hour by calling [phone].   DIET:  We do recommend a small meal at first, but then you may proceed to your regular diet.  Drink plenty of fluids but you should avoid alcoholic beverages for 24 hours.  ACTIVITY:  You should plan to take it easy for the rest of today and you should NOT DRIVE or use heavy machinery until tomorrow (because of the sedation medicines used  during the test).    FOLLOW UP: Our staff will call the number listed on your records the next business day following your procedure to check on you and address any questions or concerns that you may have regarding the information given to you following your procedure. If we do not reach you, we will leave a message.  However, if you are feeling well and you are not experiencing any problems, there is no need to return our call.  We will assume that you have returned to your regular daily activities without incident.  If any biopsies were taken you will be contacted by phone or by letter within the next 1-3 weeks.  Please call us at [phone] if you have not heard about the biopsies in 3 weeks.    SIGNATURES/CONFIDENTIALITY: You and/or your care partner have signed paperwork which will be entered into your electronic medical record.  These signatures attest to the fact that that the information above on your After Visit Summary has been reviewed and is understood.  Full responsibility of the confidentiality of this discharge information lies with you and/or your care-partner. 

## 2018-03-31 ENCOUNTER — Telehealth: Payer: Self-pay | Admitting: *Deleted

## 2018-03-31 ENCOUNTER — Ambulatory Visit: Payer: Medicare Other | Admitting: Family Medicine

## 2018-03-31 ENCOUNTER — Encounter: Payer: Self-pay | Admitting: Family Medicine

## 2018-03-31 VITALS — BP 142/86 | HR 72 | Ht 67.0 in | Wt 189.0 lb

## 2018-03-31 DIAGNOSIS — M9903 Segmental and somatic dysfunction of lumbar region: Secondary | ICD-10-CM | POA: Diagnosis not present

## 2018-03-31 DIAGNOSIS — M5416 Radiculopathy, lumbar region: Secondary | ICD-10-CM | POA: Diagnosis not present

## 2018-03-31 DIAGNOSIS — M48061 Spinal stenosis, lumbar region without neurogenic claudication: Secondary | ICD-10-CM | POA: Diagnosis not present

## 2018-03-31 DIAGNOSIS — M999 Biomechanical lesion, unspecified: Secondary | ICD-10-CM

## 2018-03-31 DIAGNOSIS — M9902 Segmental and somatic dysfunction of thoracic region: Secondary | ICD-10-CM | POA: Diagnosis not present

## 2018-03-31 DIAGNOSIS — M9901 Segmental and somatic dysfunction of cervical region: Secondary | ICD-10-CM

## 2018-03-31 DIAGNOSIS — M9904 Segmental and somatic dysfunction of sacral region: Secondary | ICD-10-CM

## 2018-03-31 DIAGNOSIS — M9908 Segmental and somatic dysfunction of rib cage: Secondary | ICD-10-CM | POA: Diagnosis not present

## 2018-03-31 NOTE — Assessment & Plan Note (Signed)
Decision today to treat with OMT was based on Physical Exam  After verbal consent patient was treated with HVLA, ME, FPR techniques in cervical, thoracic, rib, lumbar and sacral areas  Patient tolerated the procedure well with improvement in symptoms  Patient given exercises, stretches and lifestyle modifications  See medications in patient instructions if given  Patient will follow up in 4 weeks 

## 2018-03-31 NOTE — Telephone Encounter (Signed)
  Follow up Call-  Call back number 03/30/2018 07/22/2016  Post procedure Call Back phone  # [phone]  Permission to leave phone message Yes Yes  Some recent data might be hidden     Patient questions:  Do you have a fever, pain , or abdominal swelling? No. Pain Score  0 *  Have you tolerated food without any problems? Yes.    Have you been able to return to your normal activities? Yes.    Do you have any questions about your discharge instructions: Diet   No. Medications  No. Follow up visit  No.  Do you have questions or concerns about your Care? No.  Actions: * If pain score is 4 or above: No action needed, pain <4.

## 2018-03-31 NOTE — Patient Instructions (Signed)
Good to see you  You are doing great  Happy holidays!  Read about PRP See me again in 6-8 weeks

## 2018-03-31 NOTE — Assessment & Plan Note (Signed)
Patient does continue to respond well to osteopathic manipulation.  Patient's MRI is is quite severe but is doing very well.  Encouraged him to continue the gabapentin.  Continue his tai chi and staying active.  Follow-up again in 6 weeks

## 2018-04-02 ENCOUNTER — Other Ambulatory Visit: Payer: Self-pay | Admitting: Internal Medicine

## 2018-04-02 DIAGNOSIS — K2100 Gastro-esophageal reflux disease with esophagitis, without bleeding: Secondary | ICD-10-CM

## 2018-04-08 ENCOUNTER — Encounter: Payer: Self-pay | Admitting: Family Medicine

## 2018-04-08 DIAGNOSIS — M7989 Other specified soft tissue disorders: Secondary | ICD-10-CM

## 2018-04-09 ENCOUNTER — Ambulatory Visit (INDEPENDENT_AMBULATORY_CARE_PROVIDER_SITE_OTHER)
Admission: RE | Admit: 2018-04-09 | Discharge: 2018-04-09 | Disposition: A | Discharge status: Home / Self Care | Payer: Medicare Other | Source: Ambulatory Visit | Attending: Family Medicine | Admitting: Family Medicine

## 2018-04-09 DIAGNOSIS — M7989 Other specified soft tissue disorders: Secondary | ICD-10-CM

## 2018-04-10 ENCOUNTER — Ambulatory Visit (HOSPITAL_COMMUNITY)
Admission: EM | Admit: 2018-04-10 | Discharge: 2018-04-10 | Disposition: A | Discharge status: Home / Self Care | Payer: Medicare Other | Attending: Family Medicine | Admitting: Family Medicine

## 2018-04-10 ENCOUNTER — Encounter (HOSPITAL_COMMUNITY): Payer: Self-pay | Admitting: Emergency Medicine

## 2018-04-10 ENCOUNTER — Other Ambulatory Visit: Payer: Self-pay

## 2018-04-10 DIAGNOSIS — K130 Diseases of lips: Secondary | ICD-10-CM | POA: Diagnosis not present

## 2018-04-10 DIAGNOSIS — R220 Localized swelling, mass and lump, head: Secondary | ICD-10-CM | POA: Diagnosis not present

## 2018-04-10 DIAGNOSIS — S6010XA Contusion of unspecified finger with damage to nail, initial encounter: Secondary | ICD-10-CM | POA: Insufficient documentation

## 2018-04-10 DIAGNOSIS — T783XXA Angioneurotic edema, initial encounter: Secondary | ICD-10-CM | POA: Insufficient documentation

## 2018-04-10 DIAGNOSIS — S60111A Contusion of right thumb with damage to nail, initial encounter: Secondary | ICD-10-CM | POA: Diagnosis not present

## 2018-04-10 DIAGNOSIS — W230XXA Caught, crushed, jammed, or pinched between moving objects, initial encounter: Secondary | ICD-10-CM

## 2018-04-10 MED ORDER — METHYLPREDNISOLONE SODIUM SUCC 125 MG IJ SOLR
80.0000 mg | Freq: Once | INTRAMUSCULAR | Status: AC
  Administered 2018-04-10: 80 mg via INTRAMUSCULAR

## 2018-04-10 MED ORDER — METHYLPREDNISOLONE SODIUM SUCC 125 MG IJ SOLR
INTRAMUSCULAR | Status: AC
  Filled 2018-04-10: qty 2

## 2018-04-10 NOTE — Discharge Instructions (Addendum)
We Relieved  some of the pressure off of your fingernail by making a small hole for the blood to escape. This should help with some of your pain. We gave a steroid injection to here in clinic for possibility of allergic reaction to some sort of substance. You can continue the Benadryl as needed Go ahead and stop the lisinopril at this time You need to follow-up with your primary care provider on Monday for recheck

## 2018-04-10 NOTE — ED Triage Notes (Signed)
[facility] a [facility] his lips swelling since last night. The patient reported taking lisinopril daily.

## 2018-04-12 ENCOUNTER — Telehealth: Payer: Self-pay | Admitting: Family Medicine

## 2018-04-12 DIAGNOSIS — S6010XA Contusion of unspecified finger with damage to nail, initial encounter: Secondary | ICD-10-CM | POA: Diagnosis not present

## 2018-04-12 NOTE — ED Provider Notes (Addendum)
[facility]    CSN: [id number] Arrival date & time: 04/10/18  1251     History   Chief Complaint Chief Complaint  Patient presents with  . Angioedema    HPI Paul [name] is a 83 y.o. male.   Patient is a 83 year old male with past medical history of GERD, hiatal hernia, hypertension.Marland Kitchen  He presents with angioedema that started last night.  His symptoms have been constant and the swelling has been worsening.  He took Benadryl for his symptoms and reports mild decrease in swelling from this.  He denies eating anything out of the ordinary prior to the swelling starting.  He denies any new medications, food, or contact with any new substances.  He denies any itching or rash.  Patient is currently taking lisinopril for his high blood pressure.  He has been taking this medication for a while.  He denies any associated trouble swallowing, breathing, chest pain, shortness of breath.  He is also here with complaints of hematoma under the right thumbnail. This has been present since slamming finger in the car door a few days prior. He had xray done on the 13th by his primary care which did not show any fractures. His complaint is the blood under the nail, pain and swelling.   ROS per HPI      Past Medical History:  Diagnosis Date  . Cataract   . GERD (gastroesophageal reflux disease)   . Hiatal hernia   . Hypertension     Patient Active Problem List   Diagnosis Date Noted  . Hair loss 07/17/2017  . Murmur, cardiac 09/24/2016  . Well adult exam 05/21/2016  . Urge incontinence of urine 05/21/2016  . Spinal stenosis of lumbar region with radiculopathy 08/24/2015  . Arthritis of carpometacarpal joint 03/23/2013  . Nonallopathic lesion of thoracic region 12/16/2012  . Hypothyroidism 04/26/2012  . 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 Wrist pain, chronic, left 10/20/2011  . Nonallopathic lesion of lumbar region 10/24/2010  . Nonallopathic lesion of sacral region 10/24/2010  .  Pain in joint, shoulder region 07/16/2010  . CHRONIC KIDNEY DISEASE STAGE III (MODERATE) 03/15/2010  . BACK PAIN, LUMBAR, WITH RADICULOPATHY 08/30/2009  . GERD 03/09/2009  . H/O acute gouty arthritis 05/14/2007  . DEGENERATIVE DISC DISEASE, LUMBAR SPINE 07/24/2006  . Hyperlipidemia 06/25/2006  . ERECTILE DYSFUNCTION 06/25/2006  . HYPERTENSION, BENIGN SYSTEMIC 06/25/2006  . Asthma 06/25/2006    Past Surgical History:  Procedure Laterality Date  . TOTAL KNEE ARTHROPLASTY     left  . TRANSURETHRAL RESECTION OF PROSTATE  2011       Home Medications    Prior to Admission medications   Medication Sig Start Date End Date Taking? Authorizing Provider  allopurinol (ZYLOPRIM) 100 MG tablet Take 2 tablets (200 mg total) by mouth daily. 11/17/17   [name], DO  aspirin 81 MG chewable tablet Chew 81 mg by mouth daily.      [provider]  Cholecalciferol (VITAMIN D3 PO) Take 1 tablet by mouth daily.    [provider]  cloNIDine (CATAPRES - DOSED IN MG/24 HR) 0.3 mg/24hr patch apply 1 patch every week Patient taking differently: as needed.  06/17/16   [name], MD  cloNIDine (CATAPRES - DOSED IN MG/24 HR) 0.3 mg/24hr patch Place 1 patch (0.3 mg total) onto the skin once a week. 07/15/17   [name], MD  colchicine 0.6 MG tablet take 1 tablet by mouth twice a day if needed Patient  not taking: Reported on 03/30/2018 06/03/16   Nestor RampNeal, Sara L, MD  famotidine (PEPCID) 20 MG tablet Take 1 tablet (20 mg total) by mouth every evening. 03/10/18   Pyrtle, Carie CaddyJay M, MD  felodipine (PLENDIL) 10 MG 24 hr tablet Take 1 tablet (10 mg total) by mouth daily. 08/07/17   Nestor RampNeal, Sara L, MD  FLUAD 0.5 ML SUSY  02/13/16   [provider]  fluticasone (FLONASE) 50 MCG/ACT nasal spray Place 2 sprays into both nostrils daily. Patient not taking: Reported on 03/30/2018 10/27/17   Nestor RampNeal, Sara L, MD  hydrochlorothiazide (HYDRODIURIL) 25 MG tablet TAKE 1 TABLET BY MOUTH ONCE DAILY 12/04/17    Nestor RampNeal, Sara L, MD  lisinopril (PRINIVIL,ZESTRIL) 40 MG tablet TAKE 1 TABLET BY MOUTH ONCE DAILY 03/31/18   Nestor RampNeal, Sara L, MD  mirabegron ER (MYRBETRIQ) 25 MG TB24 tablet Take 1 tablet (25 mg total) by mouth daily. 05/25/17   Nestor RampNeal, Sara L, MD  pantoprazole (PROTONIX) 40 MG tablet TAKE 1 TABLET BY MOUTH ONCE DAILY 04/02/18   Pyrtle, Carie CaddyJay M, MD  polyethylene glycol powder (GLYCOLAX/MIRALAX) powder take 17GM (DISSOLVED IN WATER) by mouth twice a day AT 10 AM AND 5 PM 05/07/17   Nestor RampNeal, Sara L, MD  pravastatin (PRAVACHOL) 40 MG tablet TAKE 1 TABLET BY MOUTH ONCE DAILY 12/23/17   Westley ChandlerBrown, Carina M, MD  predniSONE (DELTASONE) 50 MG tablet Take 1 tablet (50 mg total) by mouth daily. Patient not taking: Reported on 03/30/2018 08/10/17   Judi SaaSmith, Zachary M, DO  ranitidine (ZANTAC) 150 MG tablet Take 1 tablet (150 mg total) by mouth at bedtime. 03/30/18   Pyrtle, Carie CaddyJay M, MD  sildenafil (REVATIO) 20 MG tablet TAKE 1 BY MOUTH AS DIRECTED FOR ERECTILE DYSFUNSTION 04/30/17   Moses MannersHensel, William A, MD    Family History Family History  Problem Relation Age of Onset  . Diabetes Mother   . Kidney disease Father   . Cancer Sister        ? pancreatic  . Heart disease Brother   . Cancer Sister        ? pancreatic  . Colon cancer Neg Hx   . Stomach cancer Neg Hx   . Rectal cancer Neg Hx   . Esophageal cancer Neg Hx   . Liver cancer Neg Hx     Social History Social History   Tobacco Use  . Smoking status: Former Smoker    Last attempt to quit: 04/28/1972    Years since quitting: 45.9  . Smokeless tobacco: Never Used  Substance Use Topics  . Alcohol use: Yes    Alcohol/week: 7.0 standard drinks    Types: 7 Standard drinks or equivalent per week    Comment: 1 glass of wine a night  . Drug use: No     Allergies   Patient has no known allergies.   Review of Systems Review of Systems   Physical Exam Triage Vital Signs ED Triage Vitals  Enc Vitals Group     BP 04/10/18 1413 (!) 171/85     Pulse Rate 04/10/18 1413  (!) 57     Resp 04/10/18 1413 16     Temp 04/10/18 1413 97.9 F (36.6 C)     Temp Source 04/10/18 1413 Oral     SpO2 04/10/18 1413 100 %     Weight --      Height --      Head Circumference --      Peak Flow --  Pain Score 04/10/18 1412 0     Pain Loc --      Pain Edu? --      Excl. in GC? --    No data found.  Updated Vital Signs BP (!) 171/85 (BP Location: Right Arm)   Pulse (!) 57   Temp 97.9 F (36.6 C) (Oral)   Resp 16   SpO2 100%   Visual Acuity Right Eye Distance:   Left Eye Distance:   Bilateral Distance:    Right Eye Near:   Left Eye Near:    Bilateral Near:     Physical Exam Vitals signs and nursing note reviewed.  Constitutional:      Appearance: Normal appearance.  HENT:     Head: Normocephalic and atraumatic.     Jaw: No trismus.     Nose: Nose normal.     Mouth/Throat:     Mouth: Mucous membranes are moist.     Comments: Moderate upper lip swelling.  Eyes:     Conjunctiva/sclera: Conjunctivae normal.  Neck:     Musculoskeletal: Normal range of motion.  Cardiovascular:     Rate and Rhythm: Normal rate and regular rhythm.     Heart sounds: Normal heart sounds.  Pulmonary:     Effort: Pulmonary effort is normal.     Breath sounds: Normal breath sounds.  Musculoskeletal: Normal range of motion.     Comments: subungual hematoma to the right thumb.  Mildly tender to palpation of the thumb.  Good flexion-extension of the thumb.  Skin:    General: Skin is warm and dry.  Neurological:     General: No focal deficit present.     Mental Status: He is alert.  Psychiatric:        Mood and Affect: Mood normal.      UC Treatments / Results  Labs (all labs ordered are listed, but only abnormal results are displayed) Labs Reviewed - No data to display  EKG None  Radiology No results found.  Procedures Incision and Drainage Date/Time: 04/12/2018 12:30 PM Performed by: Janace Aris, NP Authorized by: Janace Aris, NP   Consent:     Consent obtained:  Verbal   Consent given by:  Patient   Risks discussed:  Bleeding, incomplete drainage and pain   Alternatives discussed:  No treatment Location:    Type:  Subungual hematoma   Location:  Upper extremity   Upper extremity location:  Finger   Finger location:  R thumb Pre-procedure details:    Skin preparation:  Antiseptic wash Anesthesia (see MAR for exact dosages):    Anesthesia method:  None Procedure type:    Complexity:  Simple Procedure details:    Needle aspiration: no     Incision type: cautery pen.   Incision depth:  Subungual   Drainage:  Bloody   Drainage amount:  Moderate   Packing materials:  None Post-procedure details:    Patient tolerance of procedure:  Tolerated well, no immediate complications   (including critical care time)  Medications Ordered in UC Medications  methylPREDNISolone sodium succinate (SOLU-MEDROL) 125 mg/2 mL injection 80 mg (80 mg Intramuscular Given 04/10/18 1514)    Initial Impression / Assessment and Plan / UC Course  I have reviewed the triage vital signs and the nursing notes.  Pertinent labs & imaging results that were available during my care of the patient were reviewed by me and considered in my medical decision making (see chart for details).  Angioedema most likely related to ACE inhibitor In this case steroids and antihistamines simply do not help. We will have patient stop the lisinopril.  He needs to contact his doctor on Monday for discussion about blood pressure. We will go ahead and give steroid injection in clinic in case swelling is related to some sort of reaction to substance.  He can continue the Benadryl if this helps.  Subungual hematoma-used cautery pen to make a hole in thumbnail and release blood.  Procedure was successful.  Moderate amount of blood drained.  Patient's pain somewhat improved after procedure.  Wrapped finger and placed in bulky dressing.  Instructed to monitor.   Final  Clinical Impressions(s) / UC Diagnoses   Final diagnoses:  Angioedema, initial encounter  Subungual hematoma of finger, initial encounter     Discharge Instructions     We Relieved  some of the pressure off of your fingernail by making a small hole for the blood to escape. This should help with some of your pain. We gave a steroid injection to here in clinic for possibility of allergic reaction to some sort of substance. You can continue the Benadryl as needed Go ahead and stop the lisinopril at this time You need to follow-up with your primary care provider on Monday for recheck    ED Prescriptions    None     Controlled Substance Prescriptions [place] Controlled Substance Registry consulted? Not Applicable   Janace Aris, NP 04/12/18 1231    Janace Aris, NP 04/12/18 1235

## 2018-04-12 NOTE — Telephone Encounter (Signed)
Pt would like for Dr. Jennette KettleNeal to call him at [phone]. Pt has some questions concerning a visit he had at urgent care this past weekend.

## 2018-04-12 NOTE — Progress Notes (Signed)
[name] [address] [place], [place] [address] Phone: [phone] Subjective:    I Paul [name] am serving as a Neurosurgeon for Paul [name].   CC: Thumb pain  [id number]  Paul [name] is a 83 y.o. male coming in with complaint of thumb pain. Jammed his thumb in a doorway. Needs to be drained.  Onset- Last thursday Location- Thumb Duration-  Character-dull aching pain nothing severe Aggravating factors- Reliving factors-nothing so far  Severity-5 out of 10   Patient also was started on lisinopril.  Patient had angioedema after he took it.  Went to the urgent care facility.  Patient there was given a steroid injection as well as Benadryl.  Swelling resolved but still does not feel right.  Mild headache, mild dizziness, just feels lethargic compared to usual.  Past Medical History:  Diagnosis Date  . Cataract   . GERD (gastroesophageal reflux disease)   . Hiatal hernia   . Hypertension    Past Surgical History:  Procedure Laterality Date  . TOTAL KNEE ARTHROPLASTY     left  . TRANSURETHRAL RESECTION OF PROSTATE  2011   Social History   Socioeconomic History  . Marital status: Legally Separated    Spouse name: Not on file  . Number of children: 0  . Years of education: Not on file  . Highest education level: Not on file  Occupational History  . 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 Financial resource strain: Not on file  . Food insecurity:    Worry: Not on file    Inability: Not on file  . Transportation needs:    Medical: Not on file    Non-medical: Not on file  Tobacco Use  . Smoking status: Former Smoker    Last attempt to quit: 04/28/1972    Years since quitting: 45.9  . Smokeless tobacco: Never Used  Substance and Sexual Activity  . Alcohol use: Yes    Alcohol/week: 7.0 standard drinks    Types: 7 Standard drinks or equivalent per  week    Comment: 1 glass of wine a night  . Drug use: No  . Sexual activity: Yes  Lifestyle  . Physical activity:    Days per week: Not on file    Minutes per session: Not on file  . Stress: Not on file  Relationships  . Social connections:    Talks on phone: Not on file    Gets together: Not on file    Attends religious service: Not on file    Active member of club or organization: Not on file    Attends meetings of clubs or organizations: Not on file    Relationship status: Not on file  Other Topics Concern  . Not on file  Social History Narrative   Health Care POA:    Emergency Contact:    End of Life Plan:    Who lives with you: Lives by himself in 1 story home.    Any pets: none   Diet: Patient has a varied diet of protein, starch, and vegetables.  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   Exercise: Patient golfs several times a week and does yoga at home 2x week.    Seatbelts: Patient reports wearing seat belt when in vehicle.   [name] Exposure/Protection: Patient reports wearing sun screen intermittently.  Hobbies: golfing, teaching at [employer]      No Known Allergies Family History  Problem Relation Age of Onset  . Diabetes Mother   . Kidney disease Father   . Cancer Sister        ? pancreatic  . Heart disease Brother   . Cancer Sister        ? pancreatic  . Colon cancer Neg Hx   . Stomach cancer Neg Hx   . Rectal cancer Neg Hx   . Esophageal cancer Neg Hx   . Liver cancer Neg Hx     Current Outpatient Medications (Endocrine & Metabolic):  .  predniSONE (DELTASONE) 50 MG tablet, Take 1 tablet (50 mg total) by mouth daily.  Current Outpatient Medications (Cardiovascular):  .  cloNIDine (CATAPRES - DOSED IN MG/24 HR) 0.3 mg/24hr patch, apply 1 patch every week (Patient taking differently: as needed. ) .  cloNIDine (CATAPRES - DOSED IN MG/24 HR) 0.3 mg/24hr patch, Place 1 patch (0.3 mg total) onto the skin once a week. .   felodipine (PLENDIL) 10 MG 24 hr tablet, Take 1 tablet (10 mg total) by mouth daily. .  hydrochlorothiazide (HYDRODIURIL) 25 MG tablet, TAKE 1 TABLET BY MOUTH ONCE DAILY .  lisinopril (PRINIVIL,ZESTRIL) 40 MG tablet, TAKE 1 TABLET BY MOUTH ONCE DAILY .  pravastatin (PRAVACHOL) 40 MG tablet, TAKE 1 TABLET BY MOUTH ONCE DAILY .  sildenafil (REVATIO) 20 MG tablet, TAKE 1 BY MOUTH AS DIRECTED FOR ERECTILE DYSFUNSTION  Current Outpatient Medications (Respiratory):  .  fluticasone (FLONASE) 50 MCG/ACT nasal spray, Place 2 sprays into both nostrils daily.  Current Outpatient Medications (Analgesics):  .  allopurinol (ZYLOPRIM) 100 MG tablet, Take 2 tablets (200 mg total) by mouth daily. Marland Kitchen  aspirin 81 MG chewable tablet, Chew 81 mg by mouth daily.   .  colchicine 0.6 MG tablet, take 1 tablet by mouth twice a day if needed   Current Outpatient Medications (Other):  Marland Kitchen  Cholecalciferol (VITAMIN D3 PO), Take 1 tablet by mouth daily. .  famotidine (PEPCID) 20 MG tablet, Take 1 tablet (20 mg total) by mouth every evening. Marland Kitchen  FLUAD 0.5 ML SUSY,  .  mirabegron ER (MYRBETRIQ) 25 MG TB24 tablet, Take 1 tablet (25 mg total) by mouth daily. .  pantoprazole (PROTONIX) 40 MG tablet, TAKE 1 TABLET BY MOUTH ONCE DAILY .  polyethylene glycol powder (GLYCOLAX/MIRALAX) powder, take 17GM (DISSOLVED IN WATER) by mouth twice a day AT 10 AM AND 5 PM .  ranitidine (ZANTAC) 150 MG tablet, Take 1 tablet (150 mg total) by mouth at bedtime.    Past medical history, social, surgical and family history all reviewed in electronic medical record.  No pertanent information unless stated regarding to the chief complaint.   Review of Systems:  No headache, visual changes, nausea, vomiting, diarrhea, constipation, dizziness, abdominal pain, skin rash, fevers, chills, night sweats, weight loss, swollen lymph nodes, chest pain, shortness of breath, mood changes.  Positive positive for fatigue, muscle aches, body aches  Objective    Blood pressure 104/60, pulse (!) 54, height 5\' 7"  (1.702 m), weight 189 lb (85.7 kg), SpO2 98 %.   General: No apparent distress alert and oriented x3 mood and affect normal, dressed appropriately.  Patient does appear to be somewhat ill HEENT: Pupils equal, extraocular movements intact  Respiratory: Patient's speak in full sentences and does not appear short of breath  Cardiovascular: Trace lower extremity edema, non tender, no erythema  Skin: Warm dry intact  with no signs of infection or rash on extremities or on axial skeleton.  Abdomen: Soft nontender  Neuro: Cranial nerves II through XII are intact, neurovascularly intact in all extremities with 2+ DTRs and 2+ pulses.  Lymph: No lymphadenopathy of posterior or anterior cervical chain or axillae bilaterally.  Gait normal with good balance and coordination.  MSK:  tender with mild limited range of motion and good stability and symmetric strength and tone of shoulders, elbows, wrist, hip, knee and ankles bilaterally.  Hand exam shows a subungual hematoma.  With an 18-gauge needle patient did have a hole bore into the nail.  Patient had removal done today.  Patient felt better immediately.  Band-Aid placed.   Impression and Recommendations:     This case required medical decision making of moderate complexity. The above documentation has been reviewed and is accurate and complete Judi SaaZachary M Smith, DO       Note: This dictation was prepared with Dragon dictation along with smaller phrase technology. Any transcriptional errors that result from this process are unintentional.

## 2018-04-13 ENCOUNTER — Other Ambulatory Visit (INDEPENDENT_AMBULATORY_CARE_PROVIDER_SITE_OTHER): Payer: Medicare Other

## 2018-04-13 ENCOUNTER — Ambulatory Visit: Payer: Medicare Other | Admitting: Family Medicine

## 2018-04-13 VITALS — BP 104/60 | HR 54 | Ht 67.0 in | Wt 189.0 lb

## 2018-04-13 DIAGNOSIS — S60111A Contusion of right thumb with damage to nail, initial encounter: Secondary | ICD-10-CM | POA: Insufficient documentation

## 2018-04-13 DIAGNOSIS — I9589 Other hypotension: Secondary | ICD-10-CM

## 2018-04-13 DIAGNOSIS — E861 Hypovolemia: Secondary | ICD-10-CM | POA: Diagnosis not present

## 2018-04-13 DIAGNOSIS — I959 Hypotension, unspecified: Secondary | ICD-10-CM | POA: Insufficient documentation

## 2018-04-13 DIAGNOSIS — M2550 Pain in unspecified joint: Secondary | ICD-10-CM

## 2018-04-13 LAB — COMPREHENSIVE METABOLIC PANEL
ALT: 17 U/L (ref 0–53)
AST: 19 U/L (ref 0–37)
Albumin: 4 g/dL (ref 3.5–5.2)
Alkaline Phosphatase: 48 U/L (ref 39–117)
BUN: 37 mg/dL — ABNORMAL HIGH (ref 6–23)
CO2: 30 mEq/L (ref 19–32)
Calcium: 9.1 mg/dL (ref 8.4–10.5)
Chloride: 93 mEq/L — ABNORMAL LOW (ref 96–112)
Creatinine, Ser: 1.88 mg/dL — ABNORMAL HIGH (ref 0.40–1.50)
GFR: 44.5 mL/min — ABNORMAL LOW (ref >60.00)
Glucose, Bld: 105 mg/dL — ABNORMAL HIGH (ref 70–99)
Potassium: 3.3 mEq/L — ABNORMAL LOW (ref 3.5–5.1)
Sodium: 130 mEq/L — ABNORMAL LOW (ref 135–145)
Total Bilirubin: 0.4 mg/dL (ref 0.2–1.2)
Total Protein: 7.2 g/dL (ref 6.0–8.3)

## 2018-04-13 LAB — CBC WITH DIFFERENTIAL/PLATELET
Basophils Absolute: 0 10*3/uL (ref 0.0–0.1)
Basophils Relative: 0.6 % (ref 0.0–3.0)
Eosinophils Absolute: 0.2 10*3/uL (ref 0.0–0.7)
Eosinophils Relative: 2.2 % (ref 0.0–5.0)
HCT: 35.9 % — ABNORMAL LOW (ref 39.0–52.0)
Hemoglobin: 12.2 g/dL — ABNORMAL LOW (ref 13.0–17.0)
Lymphocytes Relative: 32 % (ref 12.0–46.0)
Lymphs Abs: 2.5 10*3/uL (ref 0.7–4.0)
MCHC: 34 g/dL (ref 30.0–36.0)
MCV: 85.8 fl (ref 78.0–100.0)
Monocytes Absolute: 0.8 10*3/uL (ref 0.1–1.0)
Monocytes Relative: 9.9 % (ref 3.0–12.0)
Neutro Abs: 4.3 10*3/uL (ref 1.4–7.7)
Neutrophils Relative %: 55.3 % (ref 43.0–77.0)
Platelets: 193 10*3/uL (ref 150.0–400.0)
RBC: 4.18 Mil/uL — ABNORMAL LOW (ref 4.22–5.81)
RDW: 15.7 % — ABNORMAL HIGH (ref 11.5–15.5)
WBC: 7.8 10*3/uL (ref 4.0–10.5)

## 2018-04-13 LAB — FERRITIN: Ferritin: 157.2 ng/mL (ref 22.0–322.0)

## 2018-04-13 LAB — SEDIMENTATION RATE: Sed Rate: 31 mm/hr — ABNORMAL HIGH (ref 0–20)

## 2018-04-13 LAB — TSH: TSH: 5.79 u[IU]/mL — ABNORMAL HIGH (ref 0.35–4.50)

## 2018-04-13 LAB — URIC ACID: Uric Acid, Serum: 4.7 mg/dL (ref 4.0–7.8)

## 2018-04-13 NOTE — Patient Instructions (Signed)
Good to see you  1/2 the HCTZ as well  Avoid the sidenifil for now.  Labs downstairs  Send message tomorrow to tell me the blood pressure and how you ar edoing  If not better we will consider MRI  If weakness or slurred speech go to ER immediately  I will see you again in 1 weeks (ok to double book)

## 2018-04-13 NOTE — Assessment & Plan Note (Signed)
Discussed with patient to leave Band-Aid on.  We did do a needle aspiration today.  Discussed home exercises icing regimen.  Follow-up with me again in 1 week

## 2018-04-13 NOTE — Assessment & Plan Note (Signed)
I believe that some of this is secondary to poor intake.  Did have patient discontinued the lisinopril.  Hopefully this will be beneficial.  In addition to this decrease hydrochlorothiazide to 12.5 mg.  Has had difficulty previously and is going to continue with the clonidine patch.  We discussed patient will keep a journal and write us in my chart to further evaluate how his blood pressure is doing and will make changes as needed.  Patient will follow-up with me again in 1 week.  Encourage patient to see primary care provider

## 2018-04-14 LAB — CALCIUM, IONIZED: Calcium, Ion: 4.86 mg/dL (ref 4.8–5.6)

## 2018-04-14 LAB — PTH, INTACT AND CALCIUM
Calcium: 9.3 mg/dL (ref 8.6–10.3)
PTH: 38 pg/mL (ref 14–64)

## 2018-04-14 NOTE — Telephone Encounter (Signed)
Patient left message asking for PCP to review note from visit to urgent care to to Dr. Katrinka BlazingSmith and give input.  Can either send MyChart or call.  Ples SpecterAlisa Brake, RN [facility])

## 2018-04-14 NOTE — Telephone Encounter (Signed)
Left voice message I will try  To call him back later OR if he can call back and leave specific questions I can have my nurse relayt those to me Paul LevySara Jovanne Riggenbach

## 2018-04-15 ENCOUNTER — Encounter: Payer: Self-pay | Admitting: Family Medicine

## 2018-04-16 ENCOUNTER — Other Ambulatory Visit: Payer: Self-pay | Admitting: Family Medicine

## 2018-04-16 ENCOUNTER — Encounter: Payer: Self-pay | Admitting: Family Medicine

## 2018-04-16 MED ORDER — HYDROCHLOROTHIAZIDE 25 MG PO TABS: ORAL_TABLET | ORAL | 3 refills | Status: DC

## 2018-04-16 NOTE — Telephone Encounter (Signed)
Spoke with patient.  He evidently had episode of angioedema.  They stopped his lisinopril and added HCTZ.  I will see him on January 8.  I also drew some blood work.  We discussed that.  We will repeat his blood work including sodium, hemoglobin, I would add testosterone to both his TSH.

## 2018-04-18 NOTE — Progress Notes (Deleted)
Paul ScaleZach Doyce [name] D.O. [facility] [address] [place], [place] [address] Phone: [phone] Subjective:    I'm seeing this patient by the request  of:    CC:   QIH:[id number]:Subjective  Paul RoanDavid [name] is a 83 y.o. male coming in with complaint of ***  Onset-  Location Duration-  Character- Aggravating factors- Reliving factors-  Therapies tried-  Severity-     Past Medical History:  Diagnosis Date  . Cataract   . GERD (gastroesophageal reflux disease)   . Hiatal hernia   . Hypertension    Past Surgical History:  Procedure Laterality Date  . TOTAL KNEE ARTHROPLASTY     left  . TRANSURETHRAL RESECTION OF PROSTATE  2011   Social History   Socioeconomic History  . Marital status: Legally Separated    Spouse name: Not on file  . Number of children: 0  . Years of education: Not on file  . Highest education level: Not on file  Occupational History  . 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 Financial resource strain: Not on file  . Food insecurity:    Worry: Not on file    Inability: Not on file  . Transportation needs:    Medical: Not on file    Non-medical: Not on file  Tobacco Use  . Smoking status: Former Smoker    Last attempt to quit: 04/28/1972    Years since quitting: 46.0  . Smokeless tobacco: Never Used  Substance and Sexual Activity  . Alcohol use: Yes    Alcohol/week: 7.0 standard drinks    Types: 7 Standard drinks or equivalent per week    Comment: 1 glass of wine a night  . Drug use: No  . Sexual activity: Yes  Lifestyle  . Physical activity:    Days per week: Not on file    Minutes per session: Not on file  . Stress: Not on file  Relationships  . Social connections:    Talks on phone: Not on file    Gets together: Not on file    Attends religious service: Not on file    Active member of club or organization: Not on file    Attends meetings of clubs or  organizations: Not on file    Relationship status: Not on file  Other Topics Concern  . Not on file  Social History Narrative   Health Care POA:    Emergency Contact:    End of Life Plan:    Who lives with you: Lives by himself in 1 story home.    Any pets: none   Diet: Patient has a varied diet of protein, starch, and vegetables.  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   Exercise: Patient golfs several times a week and does yoga at home 2x week.    Seatbelts: Patient reports wearing seat belt when in vehicle.   Wynelle LinkSun Exposure/Protection: Patient reports wearing sun screen intermittently.    Hobbies: golfing, teaching at [employer]      Allergies  Allergen Reactions  . Ace Inhibitors Swelling    After taking for long time had angioedema on lisinopril   Family History  Problem Relation Age of Onset  . Diabetes Mother   . Kidney disease Father   . Cancer Sister        ? pancreatic  . Heart disease Brother   .  Cancer Sister        ? pancreatic  . Colon cancer Neg Hx   . Stomach cancer Neg Hx   . Rectal cancer Neg Hx   . Esophageal cancer Neg Hx   . Liver cancer Neg Hx     Current Outpatient Medications (Endocrine & Metabolic):  .  predniSONE (DELTASONE) 50 MG tablet, Take 1 tablet (50 mg total) by mouth daily.  Current Outpatient Medications (Cardiovascular):  .  cloNIDine (CATAPRES - DOSED IN MG/24 HR) 0.3 mg/24hr patch, apply 1 patch every week (Patient taking differently: as needed. ) .  cloNIDine (CATAPRES - DOSED IN MG/24 HR) 0.3 mg/24hr patch, Place 1 patch (0.3 mg total) onto the skin once a week. .  felodipine (PLENDIL) 10 MG 24 hr tablet, Take 1 tablet (10 mg total) by mouth daily. .  hydrochlorothiazide (HYDRODIURIL) 25 MG tablet, Take one half tablet daily .  pravastatin (PRAVACHOL) 40 MG tablet, TAKE 1 TABLET BY MOUTH ONCE DAILY .  sildenafil (REVATIO) 20 MG tablet, TAKE 1 BY MOUTH AS DIRECTED FOR ERECTILE DYSFUNSTION  Current  Outpatient Medications (Respiratory):  .  fluticasone (FLONASE) 50 MCG/ACT nasal spray, Place 2 sprays into both nostrils daily.  Current Outpatient Medications (Analgesics):  .  allopurinol (ZYLOPRIM) 100 MG tablet, Take 2 tablets (200 mg total) by mouth daily. Marland Kitchen.  aspirin 81 MG chewable tablet, Chew 81 mg by mouth daily.   .  colchicine 0.6 MG tablet, take 1 tablet by mouth twice a day if needed   Current Outpatient Medications (Other):  Marland Kitchen.  Cholecalciferol (VITAMIN D3 PO), Take 1 tablet by mouth daily. .  famotidine (PEPCID) 20 MG tablet, Take 1 tablet (20 mg total) by mouth every evening. Marland Kitchen.  FLUAD 0.5 ML SUSY,  .  mirabegron ER (MYRBETRIQ) 25 MG TB24 tablet, Take 1 tablet (25 mg total) by mouth daily. .  pantoprazole (PROTONIX) 40 MG tablet, TAKE 1 TABLET BY MOUTH ONCE DAILY .  polyethylene glycol powder (GLYCOLAX/MIRALAX) powder, take 17GM (DISSOLVED IN WATER) by mouth twice a day AT 10 AM AND 5 PM .  ranitidine (ZANTAC) 150 MG tablet, Take 1 tablet (150 mg total) by mouth at bedtime.    Past medical history, social, surgical and family history all reviewed in electronic medical record.  No pertanent information unless stated regarding to the chief complaint.   Review of Systems:  No headache, visual changes, nausea, vomiting, diarrhea, constipation, dizziness, abdominal pain, skin rash, fevers, chills, night sweats, weight loss, swollen lymph nodes, body aches, joint swelling, muscle aches, chest pain, shortness of breath, mood changes.   Objective  There were no vitals taken for this visit. Systems examined below as of    General: No apparent distress alert and oriented x3 mood and affect normal, dressed appropriately.  HEENT: Pupils equal, extraocular movements intact  Respiratory: Patient's speak in full sentences and does not appear short of breath  Cardiovascular: No lower extremity edema, non tender, no erythema  Skin: Warm dry intact with no signs of infection or rash on  extremities or on axial skeleton.  Abdomen: Soft nontender  Neuro: Cranial nerves II through XII are intact, neurovascularly intact in all extremities with 2+ DTRs and 2+ pulses.  Lymph: No lymphadenopathy of posterior or anterior cervical chain or axillae bilaterally.  Gait normal with good balance and coordination.  MSK:  Non tender with full range of motion and good stability and symmetric strength and tone of shoulders, elbows, wrist, hip, knee and ankles bilaterally.  Impression and Recommendations:     This case required medical decision making of moderate complexity. The above documentation has been reviewed and is accurate and complete Lyndal Pulley, DO       Note: This dictation was prepared with Dragon dictation along with smaller phrase technology. Any transcriptional errors that result from this process are unintentional.

## 2018-04-19 ENCOUNTER — Ambulatory Visit: Payer: Medicare Other | Admitting: Family Medicine

## 2018-04-27 ENCOUNTER — Encounter: Payer: Self-pay | Admitting: Family Medicine

## 2018-05-03 ENCOUNTER — Other Ambulatory Visit: Payer: Self-pay | Admitting: *Deleted

## 2018-05-05 ENCOUNTER — Ambulatory Visit (INDEPENDENT_AMBULATORY_CARE_PROVIDER_SITE_OTHER): Payer: Medicare Other | Admitting: Family Medicine

## 2018-05-05 ENCOUNTER — Encounter: Payer: Self-pay | Admitting: Family Medicine

## 2018-05-05 ENCOUNTER — Other Ambulatory Visit: Payer: Self-pay

## 2018-05-05 VITALS — BP 150/74 | HR 67 | Temp 98.0°F | Ht 67.0 in | Wt 180.8 lb

## 2018-05-05 DIAGNOSIS — M48061 Spinal stenosis, lumbar region without neurogenic claudication: Secondary | ICD-10-CM

## 2018-05-05 DIAGNOSIS — F5232 Male orgasmic disorder: Secondary | ICD-10-CM

## 2018-05-05 DIAGNOSIS — M5416 Radiculopathy, lumbar region: Secondary | ICD-10-CM

## 2018-05-05 DIAGNOSIS — I10 Essential (primary) hypertension: Secondary | ICD-10-CM | POA: Diagnosis not present

## 2018-05-05 NOTE — Progress Notes (Signed)
CHIEF COMPLAINT / HPI: 1.  Hypertension: Had episode of angioedema that we thought was related to ACE inhibitor so that has been discontinued.  I started him on HCTZ and he has been experimenting with taking either none of that, 1/2 pill of that or whole pill of that.  He has been taking his blood pressures and marking down how he is been feeling as well as whether or not he took that.  He has had a few high pressures in the 1 50-1 60 but most of them are controlled in the 1 38-1 40 systolic in the 01-75 range.  On days he dips below that, he feels a little bit lightheaded.  He admits to not drinking as many fluids as he probably should. 2.  Also has had 2 near falls when his right foot caught the edge of a step as he was going up steps.  His greatest fear is that he will have a stroke so he is concerned about this.  He has not noticed any lower extremity weakness.  Neither of these episodes resulted in actual fall.  He is not noticed his shoe making a lot of extra noise or dragging his toe at any time.  He is not had a change in his ability to do his fairly aggressive and regular workout schedule including tai chi 3 times a week  REVIEW OF SYSTEMS: No chest pain, no lower extremity edema.  No more episodes of lip tongue swelling.  No rash.  PERTINENT  PMH / PSH: I have reviewed the patient's medications, allergies, past medical and surgical history, smoking status and updated in the EMR as appropriate. 2 new dogs, Divot and Lexi.  They are both labradoodles  OBJECTIVE:  Vital signs reviewed. GENERAL: Well-developed, well-nourished, no acute distress. CARDIOVASCULAR: Regular rate and rhythm no murmur gallop or rub LUNGS: Clear to auscultation bilaterally, no rales or wheeze. ABDOMEN: Soft positive bowel sounds NEURO: No gross focal neurological deficits.  DTRs 1-2+ bilaterally knee and ankle.  No clonus on either lower extremity. MSK: Movement of extremity x 4.  Lower extremity strength  examined in detail and 5 out of 5 symmetrical all planes at hip knee ankle.    ASSESSMENT / PLAN:  HYPERTENSION, BENIGN SYSTEMIC Think he would do better if he took the HCTZ daily basis, and we will try him on one half tab a day.  He will get some blood pressure readings and send those to me.  If he has any episodes of lightheadedness, fatigue or dizziness in the interim he will let me know through my chart.  Interestingly, he sometimes does not change his clonidine patch after the 1 week.  Is out trying to extend it to 8 or 9 days.  Says he is done this for many months to years.  He does it for cost reasons.  Looking at his blood pressure readings, I do not see any correlation with time of changing that however.   #2.  2 episodes of near falls.  I will see any evidence of foot drop today.  I do not see any focal deficits.  We discussed at length.  He will follow this and if he continues to have issues, he will let me know.  He does have known spinal stenosis and I suppose that could be giving him some intermittent problems with foot drop but it certainly not seen on clinical exam today.  #3.  Erectile dysfunction: I did not improve using his  blood pressure values that on the day he used sildenafil he had blood pressures in the 1 38-1 20 systolic range.  He does not recall feeling particularly lightheaded that day however.  Something to keep in mind if he continues to have problems with blood pressure control.  He does not and has never been on nitrates.  He has CPE next month

## 2018-05-05 NOTE — Assessment & Plan Note (Signed)
Think he would do better if he took the HCTZ daily basis, and we will try him on one half tab a day.  He will get some blood pressure readings and send those to me.  If he has any episodes of lightheadedness, fatigue or dizziness in the interim he will let me know through my chart.  Interestingly, he sometimes does not change his clonidine patch after the 1 week.  Is out trying to extend it to 8 or 9 days.  Says he is done this for many months to years.  He does it for cost reasons.  Looking at his blood pressure readings, I do not see any correlation with time of changing that however.

## 2018-05-24 ENCOUNTER — Other Ambulatory Visit: Payer: Self-pay | Admitting: Family Medicine

## 2018-05-26 ENCOUNTER — Ambulatory Visit: Payer: Medicare Other | Admitting: Family Medicine

## 2018-05-27 ENCOUNTER — Other Ambulatory Visit: Payer: Self-pay | Admitting: Family Medicine

## 2018-06-02 ENCOUNTER — Encounter: Payer: Medicare Other | Admitting: Family Medicine

## 2018-06-20 ENCOUNTER — Encounter: Payer: Self-pay | Admitting: Family Medicine

## 2018-06-21 NOTE — Progress Notes (Signed)
[name] [address] [place], [place] [address] Phone: [phone] Subjective:     CC: Left shoulder pain after fall, back pain follow-up  [id number]  Paul [name] is a 84 y.o. male coming in with complaint of neck shoulder pain.  Patient did fall recently while walking his dog.  This was 3 days ago.  Patient also hurt his wrist and hip and saw primary care provider with he did get x-rays.  X-rays were independently visualized by me showing bilateral hips fairly unremarkable but significant degenerative disc disease of the lumbar spine.  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  Patient's left shoulder seems to be a dull, throbbing aching sensation.  Rates the severity of pain is 9 out of 10.  Denies radiation down the arm but significant weakness unable to pick it up above his head at the moment.    Past Medical History:  Diagnosis Date  . Cataract   . GERD (gastroesophageal reflux disease)   . Hiatal hernia   . Hypertension    Past Surgical History:  Procedure Laterality Date  . TOTAL KNEE ARTHROPLASTY     left  . TRANSURETHRAL RESECTION OF PROSTATE  2011   Social History   Socioeconomic History  . Marital status: Legally Separated    Spouse name: Not on file  . Number of children: 0  . Years of education: Not on file  . Highest education level: Not on file  Occupational History  . 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 Financial resource strain: Not on file  . Food insecurity:    Worry: Not on file    Inability: Not on file  . Transportation needs:    Medical: Not on file    Non-medical: Not on file  Tobacco Use  . Smoking status: Former Smoker    Last attempt to quit: 04/28/1972    Years since quitting: 46.1  . Smokeless tobacco: Never Used  Substance and Sexual Activity  . Alcohol use: Yes    Alcohol/week: 7.0  standard drinks    Types: 7 Standard drinks or equivalent per week    Comment: 1 glass of wine a night  . Drug use: No  . Sexual activity: Yes  Lifestyle  . Physical activity:    Days per week: Not on file    Minutes per session: Not on file  . Stress: Not on file  Relationships  . Social connections:    Talks on phone: Not on file    Gets together: Not on file    Attends religious service: Not on file    Active member of club or organization: Not on file    Attends meetings of clubs or organizations: Not on file    Relationship status: Not on file  Other Topics Concern  . Not on file  Social History Narrative   Health Care POA:    Emergency Contact:    End of Life Plan:    Who lives with you: Lives by himself in 1 story home.    Any pets: none   Diet: Patient has a varied diet of protein, starch, and vegetables.  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   Exercise: Patient golfs several times a week and does yoga at home 2x week.    Seatbelts: Patient reports wearing seat belt when in  vehicle.   [name] Exposure/Protection: Patient reports wearing sun screen intermittently.    Hobbies: golfing, teaching at [employer]      Allergies  Allergen Reactions  . Ace Inhibitors Swelling    After taking for long time had angioedema on lisinopril  . Lisinopril     Other reaction(s): Facial swelling   Family History  Problem Relation Age of Onset  . Diabetes Mother   . Kidney disease Father   . Cancer Sister        ? pancreatic  . Heart disease Brother   . Cancer Sister        ? pancreatic  . Colon cancer Neg Hx   . Stomach cancer Neg Hx   . Rectal cancer Neg Hx   . Esophageal cancer Neg Hx   . Liver cancer Neg Hx      Current Outpatient Medications (Cardiovascular):  .  cloNIDine (CATAPRES - DOSED IN MG/24 HR) 0.3 mg/24hr patch, Place 1 patch (0.3 mg total) onto the skin once a week. .  felodipine (PLENDIL) 10 MG 24 hr tablet, TAKE 1 TABLET(10  MG) BY MOUTH DAILY .  hydrochlorothiazide (HYDRODIURIL) 25 MG tablet, Take one half tablet daily .  pravastatin (PRAVACHOL) 40 MG tablet, TAKE 1 TABLET BY MOUTH ONCE DAILY .  sildenafil (REVATIO) 20 MG tablet, TAKE 1 TABLET BY MOUTH AS DIRECTED FOR ERECTILE DYSFUNCTION  Current Outpatient Medications (Respiratory):  .  fluticasone (FLONASE) 50 MCG/ACT nasal spray, Place 2 sprays into both nostrils daily.  Current Outpatient Medications (Analgesics):  .  allopurinol (ZYLOPRIM) 100 MG tablet, Take 2 tablets (200 mg total) by mouth daily. Marland Kitchen  aspirin 81 MG chewable tablet, Chew 81 mg by mouth daily.   .  colchicine 0.6 MG tablet, take 1 tablet by mouth twice a day if needed   Current Outpatient Medications (Other):  Marland Kitchen  Cholecalciferol (VITAMIN D3 PO), Take 1 tablet by mouth daily. .  famotidine (PEPCID) 20 MG tablet, Take 1 tablet (20 mg total) by mouth every evening. Marland Kitchen  MYRBETRIQ 25 MG TB24 tablet, TAKE 1 TABLET BY MOUTH ONCE DAILY .  pantoprazole (PROTONIX) 40 MG tablet, TAKE 1 TABLET BY MOUTH ONCE DAILY .  polyethylene glycol powder (GLYCOLAX/MIRALAX) powder, TAKE 17GM(DISSOLVED IN WATER) BY MOUTH TWICE A DAY AT 10 PM AND 5 PM    Past medical history, social, surgical and family history all reviewed in electronic medical record.  No pertanent information unless stated regarding to the chief complaint.   Review of Systems:  No headache, visual changes, nausea, vomiting, diarrhea, constipation, dizziness, abdominal pain, skin rash, fevers, chills, night sweats, weight loss, swollen lymph nodes, body aches, joint swelling,  chest pain, shortness of breath, mood changes.  Positive muscle aches  Objective  Blood pressure 116/62, pulse 67, height 5\' 7"  (1.702 m), weight 187 lb (84.8 kg), SpO2 99 %.   General: No apparent distress alert and oriented x3 mood and affect normal, dressed appropriately.  HEENT: Pupils equal, extraocular movements intact  Respiratory: Patient's speak in full  sentences and does not appear short of breath  Cardiovascular: No lower extremity edema, non tender, no erythema  Skin: Warm dry intact with no signs of infection or rash on extremities or on axial skeleton.  Abdomen: Soft nontender  Neuro: Cranial nerves II through XII are intact, neurovascularly intact in all extremities with 2+ DTRs and 2+ pulses.  Lymph: No lymphadenopathy of posterior or anterior cervical chain or axillae bilaterally.  Gait antalgic MSK: Arthritic changes  of multiple joints Right wrist does show some arthritic changes but near full range of motion lacking last 20 degrees of dorsiflexion with pain over the scapholunate area.  Mild pain of the TFCC.  Lumbar exam shows loss of lordosis.  Patient has only 10 degrees of extension.  Tightness with straight leg test but no radicular symptoms.  Faber test negative.  Neurovascular intact distally with 4+ out of 5 strength.  Shoulder: Left Inspection reveals mild anterior displacement noted.  Patient actively only has forward flexion to 80 degrees.  Rotator cuff strength though appears to be intact.  Passively patient does have voluntary guarding.  Patient did then have manipulation done with internal rotation flexion did have an audible popping sensation and patient had improvement in range of motion immediately.  Rotator cuff strength 4+ out of 5.  Mild positive crossover noted.  MSK US performed of:  This study was ordered, performed, and interpreted by Terrilee Files D.O.  Shoulder:   Supraspinatus: Possible small intrasubstance tearing noted.  Does seem to have significant amount of fluid on the lateral aspect of the shoulder that could be a potential small hematoma.  Difficult to assess.  No fracture appreciated. AC joint:  C chronic arthritic changes Glenohumeral Joint:  Appears normal without effusion. Glenoid Labrum: Degenerative changes noted posterior Biceps Tendon: Significant hypoechoic changes with small intrasubstance  tearing noted.  Appears to be necrotic more than acute  Impression: Shoulder bruise with chronic rotator cuff tendinopathy l.   Osteopathic findings C6 flexed rotated and side bent left T6 extended rotated and side bent left L3 flexed rotated and side bent left  Sacrum right on right  [id number]; 15 additional minutes spent for Therapeutic exercises as stated in above notes.  This included exercises focusing on stretching, strengthening, with significant focus on eccentric aspects.   Long term goals include an improvement in range of motion, strength, endurance as well as avoiding reinjury. Patient's frequency would include in 1-2 times a day, 3-5 times a week for a duration of 6-12 weeks.  Shoulder Exercises that included:  Basic scapular stabilization to include adduction and depression of scapula Scaption, focusing on proper movement and good control Internal and External rotation utilizing a theraband, with elbow tucked at side entire time Rows with theraband  Which was given  Proper technique shown and discussed handout in great detail with ATC.  All questions were discussed and answered.     Impression and Recommendations:     This case required medical decision making of moderate complexity. The above documentation has been reviewed and is accurate and complete [name], DO       Note: This dictation was prepared with Dragon dictation along with smaller phrase technology. Any transcriptional errors that result from this process are unintentional.

## 2018-06-22 ENCOUNTER — Other Ambulatory Visit: Payer: Self-pay

## 2018-06-22 ENCOUNTER — Ambulatory Visit: Payer: Medicare Other | Admitting: Family Medicine

## 2018-06-22 ENCOUNTER — Encounter: Payer: Self-pay | Admitting: Family Medicine

## 2018-06-22 ENCOUNTER — Ambulatory Visit: Payer: Self-pay

## 2018-06-22 ENCOUNTER — Ambulatory Visit
Admission: RE | Admit: 2018-06-22 | Discharge: 2018-06-22 | Disposition: A | Discharge status: Home / Self Care | Payer: Medicare Other | Source: Ambulatory Visit | Attending: Family Medicine | Admitting: Family Medicine

## 2018-06-22 ENCOUNTER — Ambulatory Visit (INDEPENDENT_AMBULATORY_CARE_PROVIDER_SITE_OTHER): Payer: Medicare Other | Admitting: Family Medicine

## 2018-06-22 VITALS — BP 116/62 | HR 67 | Ht 67.0 in | Wt 187.0 lb

## 2018-06-22 VITALS — BP 116/62 | HR 67 | Temp 98.7°F | Ht 67.0 in | Wt 187.2 lb

## 2018-06-22 DIAGNOSIS — M999 Biomechanical lesion, unspecified: Secondary | ICD-10-CM | POA: Diagnosis not present

## 2018-06-22 DIAGNOSIS — M25531 Pain in right wrist: Secondary | ICD-10-CM

## 2018-06-22 DIAGNOSIS — M5416 Radiculopathy, lumbar region: Secondary | ICD-10-CM | POA: Diagnosis not present

## 2018-06-22 DIAGNOSIS — I10 Essential (primary) hypertension: Secondary | ICD-10-CM

## 2018-06-22 DIAGNOSIS — M25512 Pain in left shoulder: Secondary | ICD-10-CM | POA: Diagnosis not present

## 2018-06-22 DIAGNOSIS — M25559 Pain in unspecified hip: Secondary | ICD-10-CM | POA: Diagnosis not present

## 2018-06-22 DIAGNOSIS — M48061 Spinal stenosis, lumbar region without neurogenic claudication: Secondary | ICD-10-CM | POA: Diagnosis not present

## 2018-06-22 DIAGNOSIS — Z0000 Encounter for general adult medical examination without abnormal findings: Secondary | ICD-10-CM

## 2018-06-22 DIAGNOSIS — S43012A Anterior subluxation of left humerus, initial encounter: Secondary | ICD-10-CM | POA: Diagnosis not present

## 2018-06-22 NOTE — Assessment & Plan Note (Signed)
Mild subluxation noted.  Manipulation done today and patient had improvement in range of motion almost immediately.  Discussed with patient about home exercises and range of motion avoiding significant lifting for the course of time.  Discussed the possibility of being in a sling the patient is doing significantly better at this moment.  Patient is to increase activity slowly over the course of the next several days.  Follow-up again in 3 to 4 weeks patient on ultrasound does have some degenerative changes of the rotator cuff and will need to monitor but no significant weakness noted today

## 2018-06-22 NOTE — Patient Instructions (Signed)
I will send you  A note about your labs I will call you about your x rays  I will place you on a list for a medicare Wellness visit and you should hear from [place] in a few weeks to schedule

## 2018-06-22 NOTE — Assessment & Plan Note (Signed)
Decision today to treat with OMT was based on Physical Exam  After verbal consent patient was treated with HVLA, ME, FPR techniques in  thoracic, lumbar and sacral areas  Patient tolerated the procedure well with improvement in symptoms  Patient given exercises, stretches and lifestyle modifications  See medications in patient instructions if given  Patient will follow up in 4 weeks 

## 2018-06-22 NOTE — Patient Instructions (Signed)
Good to see you  Ice 20 minutes  Every 4 hours for next 2 days  Shoulder was subluxed  New exercises for the shoulder  Stay active but hold from golf.  See me again in 4 weeks

## 2018-06-22 NOTE — Assessment & Plan Note (Signed)
Stable.  Has seen patient multiple times.  Responding to the osteopathic manipulation.  Discussed icing regimen and home exercise, and discussed which activities to avoid.  Follow-up with me again in 4 weeks

## 2018-06-23 LAB — CMP14+EGFR
ALT: 19 IU/L (ref 0–44)
AST: 25 IU/L (ref 0–40)
Albumin/Globulin Ratio: 1.5 (ref 1.2–2.2)
Albumin: 4.3 g/dL (ref 3.6–4.6)
Alkaline Phosphatase: 53 IU/L (ref 39–117)
BUN/Creatinine Ratio: 22 (ref 10–24)
BUN: 31 mg/dL — ABNORMAL HIGH (ref 8–27)
Bilirubin Total: 0.4 mg/dL (ref 0.0–1.2)
CO2: 26 mmol/L (ref 20–29)
Calcium: 9.1 mg/dL (ref 8.6–10.2)
Chloride: 99 mmol/L (ref 96–106)
Creatinine, Ser: 1.39 mg/dL — ABNORMAL HIGH (ref 0.76–1.27)
GFR calc Af Amer: 55 mL/min/{1.73_m2} — ABNORMAL LOW (ref >59)
GFR calc non Af Amer: 47 mL/min/{1.73_m2} — ABNORMAL LOW (ref >59)
Globulin, Total: 2.9 g/dL (ref 1.5–4.5)
Glucose: 92 mg/dL (ref 65–99)
Potassium: 3.7 mmol/L (ref 3.5–5.2)
Sodium: 141 mmol/L (ref 134–144)
Total Protein: 7.2 g/dL (ref 6.0–8.5)

## 2018-06-23 LAB — PSA: Prostate Specific Ag, Serum: 3.5 ng/mL (ref 0.0–4.0)

## 2018-06-23 LAB — LDL CHOLESTEROL, DIRECT: LDL Direct: 109 mg/dL — ABNORMAL HIGH (ref 0–99)

## 2018-06-24 ENCOUNTER — Other Ambulatory Visit: Payer: Self-pay | Admitting: Family Medicine

## 2018-06-24 DIAGNOSIS — M25531 Pain in right wrist: Secondary | ICD-10-CM

## 2018-06-24 NOTE — Progress Notes (Signed)
    CHIEF COMPLAINT / HPI: Larey Seat down a few steps a couple of days ago and landed on his right wrist and his right thigh.  He has a big bruise on his right thigh.  He has a little bit of left hip pain when he goes up and down steps.  His right wrist is a little sore and feels a little stiff, may be swollen.  Initially scheduled for his physical and wants to do his most of that today as he can.  REVIEW OF SYSTEMS: Denies chest pain, no shortness of breath, no lower extremity edema.  No headaches.  No abdominal pain.  No diarrhea, no constipation.  No dyspnea on exertion.  No heartburn.  Energy level is good.  Denies headaches.  PERTINENT  PMH / PSH: I have reviewed the patient's medications, allergies, past medical and surgical history, smoking status and updated in the EMR as appropriate.   OBJECTIVE: Vital signs reviewed. GENERAL: Well-developed, well-nourished, no acute distress. CARDIOVASCULAR: Regular rate and rhythm no murmur gallop or rub LUNGS: Clear to auscultation bilaterally, no rales or wheeze. ABDOMEN: Soft positive bowel sounds NEURO: No gross focal neurological deficits. MSK: Movement of extremity x 4. SHOULDERS: He has some pain with elevation of the right shoulder in abduction and forward flexion above 90 degrees but he has intact strength and can get to full range of motion.  Shoulders are symmetrical.  Internal and external rotation is normal in range and painless. Hips: Internal and external rotation painless and full.  He has a little pain with hop test on the left. Wrist: Right.  Tender to Palpation over the Midline Portion of the Distal Radius.  There Is No Bruising Here.  He Has Limited Flexion Extension but This Is Baseline for His Known OA of the Wrist.  He Says the Range Of Motion Today's Not Changed. PSYCH: AxOx4. Good eye contact.. No psychomotor retardation or agitation. Appropriate speech fluency and content. Asks and answers questions appropriately. Mood is  congruent.     ASSESSMENT / PLAN:  Well adult exam We did as much of his wellness visit today as we could.  I will get him set up for Medicare wellness visit.  We will get labs today.  HYPERTENSION, BENIGN SYSTEMIC He had some readings from his home diary of blood pressures that were in the 140s to 160s.  We checked his device today against ours and it is not accurate.  There is about 30 points difference.  I would not make any changes based on his home record at this time.  In fact he probably can stop keeping records.   Regarding his fall, will get x-rays of left hip to rule out occult fracture.  He is pretty tender on the wrist so we will also get wrist films.   ADDENDUM: His hip films look fine.  His right wrist films showed a widened scapholunate space.  Radiology did not comment on any cortical defect or abnormality of the distal radius but when I looked at the film it looks a little odd to me.  I called and talked with him about this.  I think the best bet would be to get an MRI of his wrist to evaluate for scapholunate ligament disruption as well as look at the distal radius in more detail.  This is especially important for him as he is an avid golfer.

## 2018-06-24 NOTE — Assessment & Plan Note (Signed)
He had some readings from his home diary of blood pressures that were in the 140s to 160s.  We checked his device today against ours and it is not accurate.  There is about 30 points difference.  I would not make any changes based on his home record at this time.  In fact he probably can stop keeping records.

## 2018-06-24 NOTE — Assessment & Plan Note (Signed)
We did as much of his wellness visit today as we could.  I will get him set up for Medicare wellness visit.  We will get labs today.

## 2018-06-25 ENCOUNTER — Other Ambulatory Visit: Payer: Medicare Other

## 2018-06-28 ENCOUNTER — Encounter: Payer: Self-pay | Admitting: Family Medicine

## 2018-06-30 ENCOUNTER — Telehealth: Payer: Self-pay | Admitting: *Deleted

## 2018-06-30 DIAGNOSIS — M25512 Pain in left shoulder: Secondary | ICD-10-CM

## 2018-06-30 NOTE — Telephone Encounter (Signed)
There was a mix up with scheduling his MRI and he has not done it yet.  He states that his wrist is feeling better and he has the same ROM as the other wrist.   He wants to know if Dr. Jennette Kettle wants him to reschedule or just refer him to a hand specialist. Oluwadarasimi Redmon, Maryjo Rochester, CMA

## 2018-06-30 NOTE — Telephone Encounter (Signed)
There was some confusion about his wrist MRI so it did not get done.  He says it is much better.  We discussed.  Given his golf habit, I think he probably should at least follow-up with a hand surgeon.  He saw him 3 weeks ago which is before the fall.  He will call the office and if they cannot get him Paul [name] new referral, he will let me know.  Otherwise he will follow-up with a hand surgeon.  He wants to get an x-ray of the left shoulder because it is still bothering him.  We also reviewed his hip x-ray results and his lab results.  He says the left hip still bothers him going up and down steps.  I would give it another 3 to 4 weeks before I would consider doing MR arthrogram to look for labral tear.

## 2018-07-01 ENCOUNTER — Ambulatory Visit
Admission: RE | Admit: 2018-07-01 | Discharge: 2018-07-01 | Disposition: A | Discharge status: Home / Self Care | Payer: Medicare Other | Source: Ambulatory Visit | Attending: Family Medicine | Admitting: Family Medicine

## 2018-07-01 DIAGNOSIS — M25512 Pain in left shoulder: Secondary | ICD-10-CM

## 2018-07-06 NOTE — Telephone Encounter (Signed)
Taken care of in different phone note. Alayna Mabe, Maryjo Rochester, CMA

## 2018-07-07 ENCOUNTER — Encounter: Payer: Self-pay | Admitting: Family Medicine

## 2018-07-07 ENCOUNTER — Telehealth: Payer: Self-pay | Admitting: *Deleted

## 2018-07-07 NOTE — Telephone Encounter (Signed)
Patient is requesting results of xray of his left shoulder.  Fleeger, Maryjo Rochester, CMA

## 2018-07-08 NOTE — Telephone Encounter (Signed)
Dear Cliffton Asters Team Please tekl him it is normal other than some moderate arthritis

## 2018-07-08 NOTE — Telephone Encounter (Signed)
Pt informed. He was thankful for the call. Sunday Spillers, CMA

## 2018-07-09 ENCOUNTER — Telehealth: Payer: Self-pay

## 2018-07-09 DIAGNOSIS — G8929 Other chronic pain: Secondary | ICD-10-CM

## 2018-07-09 DIAGNOSIS — M2420 Disorder of ligament, unspecified site: Secondary | ICD-10-CM

## 2018-07-09 DIAGNOSIS — M25532 Pain in left wrist: Principal | ICD-10-CM

## 2018-07-09 NOTE — Telephone Encounter (Signed)
Patient was told by hand specialist you told him to call that a referral is needed. Please place referral.  Call back is [phone].  Ples Specter, RN [facility] RN)

## 2018-07-10 NOTE — Progress Notes (Signed)
Paul [name] D.O. [facility] [address] [place], [place] [address] Phone: [name] Subjective:   Paul [name], Paul [name], am serving as a scribe for Dr. Antoine PrimasZachary [name] [place].   CC: Back pain follow-up  BJY:NWGNFAOZHYHPI:Subjective   06/02/2018: Mild subluxation noted.  Manipulation done today and patient had improvement in range of motion almost immediately.  Discussed with patient about home exercises and range of motion avoiding significant lifting for the course of time.  Discussed the possibility of being in a sling the patient is doing significantly better at this moment.  Patient is to increase activity slowly over the course of the next several days.  Follow-up again in 3 to 4 weeks patient on ultrasound does have some degenerative changes of the rotator cuff and will need to monitor but no significant weakness noted today  Update 07/12/2018: Paul [name] is a 84 y.o. male coming in with complaint of left shoulder pain. He does feel like he has had improvement but does still have pain with flexion, IR and abd and adduction.  Still having pain.  Waking him up at night.  Rates the severity of pain is 9 out of 10.  Also having right wrist pain. Was suppose to have MRI but did not make appointment. Patient is on waiting list for hand specialist. Pain with extension. Has decreased ROM.  Patient was to have MRI but unfortunately there was a scheduling conflict.  Continues to have pain.  Wants to make sure he is doing the right thing.  All seem to be secondary to the accident.   Known severe spinal stenosis lumbar spine.  Has responded well to manipulation.  No radicular symptoms at this time.  No new symptoms just tightness in the back.  Past Medical History:  Diagnosis Date  . Cataract   . GERD (gastroesophageal reflux disease)   . Hiatal hernia   . Hypertension    Past Surgical History:  Procedure Laterality Date  . TOTAL KNEE ARTHROPLASTY     left  . TRANSURETHRAL RESECTION OF PROSTATE  2011    Social History   Socioeconomic History  . Marital status: Legally Separated    Spouse name: Not on file  . Number of children: 0  . Years of education: Not on file  . Highest education level: Not on file  Occupational History  . 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 Financial resource strain: Not on file  . Food insecurity:    Worry: Not on file    Inability: Not on file  . Transportation needs:    Medical: Not on file    Non-medical: Not on file  Tobacco Use  . Smoking status: Former Smoker    Last attempt to quit: 04/28/1972    Years since quitting: 46.2  . Smokeless tobacco: Never Used  Substance and Sexual Activity  . Alcohol use: Yes    Alcohol/week: 7.0 standard drinks    Types: 7 Standard drinks or equivalent per week    Comment: 1 glass of wine a night  . Drug use: No  . Sexual activity: Yes  Lifestyle  . Physical activity:    Days per week: Not on file    Minutes per session: Not on file  . Stress: Not on file  Relationships  . Social connections:    Talks on phone: Not on file    Gets together: Not on  file    Attends religious service: Not on file    Active member of club or organization: Not on file    Attends meetings of clubs or organizations: Not on file    Relationship status: Not on file  Other Topics Concern  . Not on file  Social History Narrative   Health Care POA:    Emergency Contact:    End of Life Plan:    Who lives with you: Lives by himself in 1 story home.    Any pets: none   Diet: Patient has a varied diet of protein, starch, and vegetables.  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   Exercise: Patient golfs several times a week and does yoga at home 2x week.    Seatbelts: Patient reports wearing seat belt when in vehicle.   [name] Exposure/Protection: Patient reports wearing sun screen intermittently.    Hobbies: golfing, teaching  at [employer]      Allergies  Allergen Reactions  . Ace Inhibitors Swelling    After taking for long time had angioedema on lisinopril  . Lisinopril     Other reaction(s): Facial swelling   Family History  Problem Relation Age of Onset  . Diabetes Mother   . Kidney disease Father   . Cancer Sister        ? pancreatic  . Heart disease Brother   . Cancer Sister        ? pancreatic  . Colon cancer Neg Hx   . Stomach cancer Neg Hx   . Rectal cancer Neg Hx   . Esophageal cancer Neg Hx   . Liver cancer Neg Hx      Current Outpatient Medications (Cardiovascular):  .  cloNIDine (CATAPRES - DOSED IN MG/24 HR) 0.3 mg/24hr patch, Place 1 patch (0.3 mg total) onto the skin once a week. .  felodipine (PLENDIL) 10 MG 24 hr tablet, TAKE 1 TABLET(10 MG) BY MOUTH DAILY .  hydrochlorothiazide (HYDRODIURIL) 25 MG tablet, Take one half tablet daily .  pravastatin (PRAVACHOL) 40 MG tablet, TAKE 1 TABLET BY MOUTH ONCE DAILY .  sildenafil (REVATIO) 20 MG tablet, TAKE 1 TABLET BY MOUTH AS DIRECTED FOR ERECTILE DYSFUNCTION  Current Outpatient Medications (Respiratory):  .  fluticasone (FLONASE) 50 MCG/ACT nasal spray, Place 2 sprays into both nostrils daily.  Current Outpatient Medications (Analgesics):  .  allopurinol (ZYLOPRIM) 100 MG tablet, Take 2 tablets (200 mg total) by mouth daily. Marland Kitchen  aspirin 81 MG chewable tablet, Chew 81 mg by mouth daily.   .  colchicine 0.6 MG tablet, take 1 tablet by mouth twice a day if needed   Current Outpatient Medications (Other):  Marland Kitchen  Cholecalciferol (VITAMIN D3 PO), Take 1 tablet by mouth daily. .  famotidine (PEPCID) 20 MG tablet, Take 1 tablet (20 mg total) by mouth every evening. Marland Kitchen  MYRBETRIQ 25 MG TB24 tablet, TAKE 1 TABLET BY MOUTH ONCE DAILY .  pantoprazole (PROTONIX) 40 MG tablet, TAKE 1 TABLET BY MOUTH ONCE DAILY .  polyethylene glycol powder (GLYCOLAX/MIRALAX) powder, TAKE 17GM(DISSOLVED IN WATER) BY MOUTH TWICE A DAY AT 10 PM AND 5  PM    Past medical history, social, surgical and family history all reviewed in electronic medical record.  No pertanent information unless stated regarding to the chief complaint.   Review of Systems:  No headache, visual changes, nausea, vomiting, diarrhea, constipation, dizziness, abdominal pain, skin rash, fevers, chills, night sweats, weight loss, swollen lymph nodes, body aches, joint  swelling,chest pain, shortness of breath, mood changes.  Positive muscle aches  Objective  Blood pressure 130/82, pulse 67, height 5\' 7"  (1.702 m), weight 187 lb (84.8 kg), SpO2 99 %.    General: No apparent distress alert and oriented x3 mood and affect normal, dressed appropriately.  HEENT: Pupils equal, extraocular movements intact  Respiratory: Patient's speak in full sentences and does not appear short of breath  Cardiovascular: No lower extremity edema, non tender, no erythema  Skin: Warm dry intact with no signs of infection or rash on extremities or on axial skeleton.  Abdomen: Soft nontender  Neuro: Cranial nerves II through XII are intact, neurovascularly intact in all extremities with 2+ DTRs and 2+ pulses.  Lymph: No lymphadenopathy of posterior or anterior cervical chain or axillae bilaterally.  Gait normal with good balance and coordination.  MSK: Arthritic changes of multiple joints but has near full range of motion. Back Exam:  Inspection: Loss of lordosis Motion: Flexion 45 deg, Extension 25 deg, Side Bending to 35 deg bilaterally,  Rotation to 35 deg bilaterally  SLR laying: Negative  XSLR laying: Negative  Palpable tenderness: Tender to palpation of paraspinal musculature lumbar spine right greater than left. FABER: Tightness bilaterally on piriformis. Sensory change: Gross sensation intact to all lumbar and sacral dermatomes.  Reflexes: 2+ at both patellar tendons, 2+ at achilles tendons, Babinski's downgoing.  Strength at foot  Plantar-flexion: 5/5 Dorsi-flexion: 5/5  Eversion: 5/5 Inversion: 5/5  Leg strength  Quad: 5/5 Hamstring: 5/5 Hip flexor: 5/5 Hip abductors: 5/5   Left shoulder exam still show some positive impingement.  Rotator cuff strength 4+ out of 5 compared to the contralateral side.  Does have near full range of motion though noted.  Right wrist shows arthritic changes.  Severe tenderness still over the dorsal aspect of the wrist.  Procedure: Real-time Ultrasound Guided Injection of left glenohumeral joint Device: GE Logiq E  Ultrasound guided injection is preferred based studies that show increased duration, increased effect, greater accuracy, decreased procedural pain, increased response rate with ultrasound guided versus blind injection.  Verbal informed consent obtained.  Time-out conducted.  Noted no overlying erythema, induration, or other signs of local infection.  Skin prepped in a sterile fashion.  Local anesthesia: Topical Ethyl chloride.  With sterile technique and under real time ultrasound guidance:  Joint visualized.  21g 2 inch needle inserted posterior approach. Pictures taken for needle placement. Patient did have injection of 2 cc of 0.5% Marcaine, and 1cc of Kenalog 40 mg/dL. Completed without difficulty  Pain immediately resolved suggesting accurate placement of the medication.  Advised to call if fevers/chills, erythema, induration, drainage, or persistent bleeding.  Images permanently stored and available for review in the ultrasound unit.  Impression: Technically successful ultrasound guided injection.    Osteopathic findings  T6 extended rotated and side bent left L2 flexed rotated and side bent right Sacrum right on right  Impression and Recommendations:     This case required medical decision making of moderate complexity. The above documentation has been reviewed and is accurate and complete [name], DO       Note: This dictation was prepared with Dragon dictation along with smaller phrase  technology. Any transcriptional errors that result from this process are unintentional.

## 2018-07-12 ENCOUNTER — Ambulatory Visit: Payer: Medicare Other | Admitting: Family Medicine

## 2018-07-12 ENCOUNTER — Encounter: Payer: Self-pay | Admitting: Family Medicine

## 2018-07-12 ENCOUNTER — Ambulatory Visit: Payer: Self-pay

## 2018-07-12 ENCOUNTER — Other Ambulatory Visit: Payer: Self-pay

## 2018-07-12 VITALS — BP 130/82 | HR 67 | Ht 67.0 in | Wt 187.0 lb

## 2018-07-12 DIAGNOSIS — S43012D Anterior subluxation of left humerus, subsequent encounter: Secondary | ICD-10-CM

## 2018-07-12 DIAGNOSIS — M999 Biomechanical lesion, unspecified: Secondary | ICD-10-CM

## 2018-07-12 DIAGNOSIS — M25531 Pain in right wrist: Secondary | ICD-10-CM

## 2018-07-12 DIAGNOSIS — M4807 Spinal stenosis, lumbosacral region: Secondary | ICD-10-CM

## 2018-07-12 NOTE — Patient Instructions (Signed)
God to see you  Paul [name] is your friend Stay active Get the MRI of the wrist and can call [phone] to schedule.  The shoulder we injected today and should do well  See me again in 6 weeks and make an appointment online

## 2018-07-12 NOTE — Assessment & Plan Note (Signed)
Decision today to treat with OMT was based on Physical Exam  After verbal consent patient was treated with HVLA, ME, FPR techniques in cervical, thoracic, lumbar and sacral areas  Patient tolerated the procedure well with improvement in symptoms  Patient given exercises, stretches and lifestyle modifications  See medications in patient instructions if given  Patient will follow up in 4-6 weeks 

## 2018-07-12 NOTE — Assessment & Plan Note (Signed)
Patient given injection.  Tolerated procedure well.  Discussed icing regimen and home exercise.  Near full range of motion immediately.  Follow-up again in 4 to 8 weeks.

## 2018-07-13 ENCOUNTER — Ambulatory Visit
Admission: RE | Admit: 2018-07-13 | Discharge: 2018-07-13 | Disposition: A | Discharge status: Home / Self Care | Payer: Medicare Other | Source: Ambulatory Visit | Attending: Family Medicine | Admitting: Family Medicine

## 2018-07-13 DIAGNOSIS — M25531 Pain in right wrist: Secondary | ICD-10-CM

## 2018-07-16 NOTE — Telephone Encounter (Signed)
Dear Cliffton Asters Team Please let him know that I sent a referral in to Dr. Amanda Pea.  It was not listed in the original phone note but I think that is what he wanted.  I have sent the referral in. Denny Levy

## 2018-07-16 NOTE — Telephone Encounter (Signed)
LVM asking for a return call to our office. If pt calls, please let him know a referral has been placed to Dr. Amanda Pea for his hand. Sunday Spillers, CMA

## 2018-07-19 ENCOUNTER — Encounter: Payer: Self-pay | Admitting: Family Medicine

## 2018-07-19 NOTE — Telephone Encounter (Signed)
Informed pt and he asked about getting his MRI results. Routing to PCP. April Zimmerman Rumple, CMA

## 2018-07-20 NOTE — Telephone Encounter (Signed)
Spoke w pt He has appy w hand dr this Thursday Paul [name]

## 2018-07-21 ENCOUNTER — Ambulatory Visit: Payer: Medicare Other | Admitting: Family Medicine

## 2018-07-28 ENCOUNTER — Other Ambulatory Visit: Payer: Self-pay | Admitting: Family Medicine

## 2018-08-22 NOTE — Progress Notes (Signed)
[name] [address], [place] [address] Phone: [phone] Subjective:    I'm seeing this patient by the request  of:    CC: Back pain follow-up  XTG:GYIRSWNIOE  Paul [name] is a 84 y.o. male coming in with complaint of neck pain bilateral.  History of spinal stenosis severe.  Patient still has some discomfort overall.  Discussed tightness.  Discussed which activities did not do which he has been doing occasionally.  Feels that his tai chi has been helpful  Continues to have left shoulder pain. Does not bother him with golfing. Pain is dull.  Nothing severe.  Not stopping him from activities.  Notices more discomfort now after golfing 2 days in a row.     Past Medical History:  Diagnosis Date  . Cataract   . GERD (gastroesophageal reflux disease)   . Hiatal hernia   . Hypertension    Past Surgical History:  Procedure Laterality Date  . TOTAL KNEE ARTHROPLASTY     left  . TRANSURETHRAL RESECTION OF PROSTATE  2011   Social History   Socioeconomic History  . Marital status: Legally Separated    Spouse name: Not on file  . Number of children: 0  . Years of education: Not on file  . Highest education level: Not on file  Occupational History  . Occupation: retired    Fish farm manager: [employer]: [place]  . Financial resource strain: Not on file  . Food insecurity:    Worry: Not on file    Inability: Not on file  . Transportation needs:    Medical: Not on file    Non-medical: Not on file  Tobacco Use  . Smoking status: Former Smoker    Last attempt to quit: 04/28/1972    Years since quitting: 46.3  . Smokeless tobacco: Never Used  Substance and Sexual Activity  . Alcohol use: Yes    Alcohol/week: 7.0 standard drinks    Types: 7 Standard drinks or equivalent per week    Comment: 1 glass of wine a night  . Drug use: No  . Sexual activity: Yes  Lifestyle  . Physical  activity:    Days per week: Not on file    Minutes per session: Not on file  . Stress: Not on file  Relationships  . Social connections:    Talks on phone: Not on file    Gets together: Not on file    Attends religious service: Not on file    Active member of club or organization: Not on file    Attends meetings of clubs or organizations: Not on file    Relationship status: Not on file  Other Topics Concern  . Not on file  Social History Narrative   Health Care POA:    Emergency Contact:    End of Life Plan:    Who lives with you: Lives by himself in 1 story home.    Any pets: none   Diet: Patient has a varied diet of protein, starch, and vegetables.  Is currently working with [facility] for nutrition.   Exercise: Patient golfs several times a week and does yoga at home 2x week.    Seatbelts: Patient reports wearing seat belt when in vehicle.   Nancy Fetter Exposure/Protection: Patient reports wearing sun screen intermittently.    Hobbies: golfing, teaching at [employer]  Allergies  Allergen Reactions  . Ace Inhibitors Swelling    After taking for long time had angioedema on lisinopril  . Lisinopril     Other reaction(s): Facial swelling   Family History  Problem Relation Age of Onset  . Diabetes Mother   . Kidney disease Father   . Cancer Sister        ? pancreatic  . Heart disease Brother   . Cancer Sister        ? pancreatic  . Colon cancer Neg Hx   . Stomach cancer Neg Hx   . Rectal cancer Neg Hx   . Esophageal cancer Neg Hx   . Liver cancer Neg Hx      Current Outpatient Medications (Cardiovascular):  .  cloNIDine (CATAPRES - DOSED IN MG/24 HR) 0.3 mg/24hr patch, Place 1 patch (0.3 mg total) onto the skin once a week. .  felodipine (PLENDIL) 10 MG 24 hr tablet, TAKE 1 TABLET(10 MG) BY MOUTH DAILY .  hydrochlorothiazide (HYDRODIURIL) 25 MG tablet, Take one half tablet daily .  pravastatin (PRAVACHOL) 40 MG tablet, TAKE 1 TABLET BY MOUTH ONCE  DAILY .  sildenafil (REVATIO) 20 MG tablet, TAKE 1 TABLET BY MOUTH AS DIRECTED FOR ERECTILE DYSFUNCTION  Current Outpatient Medications (Respiratory):  .  fluticasone (FLONASE) 50 MCG/ACT nasal spray, Place 2 sprays into both nostrils daily.  Current Outpatient Medications (Analgesics):  .  allopurinol (ZYLOPRIM) 100 MG tablet, Take 2 tablets (200 mg total) by mouth daily. Marland Kitchen  aspirin 81 MG chewable tablet, Chew 81 mg by mouth daily.   .  colchicine 0.6 MG tablet, take 1 tablet by mouth twice a day if needed   Current Outpatient Medications (Other):  Marland Kitchen  Cholecalciferol (VITAMIN D3 PO), Take 1 tablet by mouth daily. .  famotidine (PEPCID) 20 MG tablet, Take 1 tablet (20 mg total) by mouth every evening. Marland Kitchen  MYRBETRIQ 25 MG TB24 tablet, TAKE 1 TABLET BY MOUTH ONCE DAILY .  pantoprazole (PROTONIX) 40 MG tablet, TAKE 1 TABLET BY MOUTH ONCE DAILY .  polyethylene glycol powder (GLYCOLAX/MIRALAX) powder, TAKE 17GM(DISSOLVED IN WATER) BY MOUTH TWICE A DAY AT 10 PM AND 5 PM    Past medical history, social, surgical and family history all reviewed in electronic medical record.  No pertanent information unless stated regarding to the chief complaint.   Review of Systems:  No headache, visual changes, nausea, vomiting, diarrhea, constipation, dizziness, abdominal pain, skin rash, fevers, chills, night sweats, weight loss, swollen lymph nodes, body aches, joint swelling, chest pain, shortness of breath, mood changes.  Positive muscle aches  Objective  Blood pressure 126/82, pulse 69, height '5\' 7"'$  (1.702 m), weight 182 lb (82.6 kg), SpO2 99 %.    General: No apparent distress alert and oriented x3 mood and affect normal, dressed appropriately.  HEENT: Pupils equal, extraocular movements intact  Respiratory: Patient's speak in full sentences and does not appear short of breath  Cardiovascular: No lower extremity edema, non tender, no erythema  Skin: Warm dry intact with no signs of infection or rash  on extremities or on axial skeleton.  Abdomen: Soft nontender  Neuro: Cranial nerves II through XII are intact, neurovascularly intact in all extremities with 2+ DTRs and 2+ pulses.  Lymph: No lymphadenopathy of posterior or anterior cervical chain or axillae bilaterally.  Gait normal with good balance and coordination.  MSK:  Non tender with full range of motion and good stability and symmetric strength and tone of shoulders, elbows, wrist, hip,  knee and ankles bilaterally.  Back Exam:  Inspection: Loss of lordosis with mild degenerative scoliosis Motion: Flexion 45 deg, Extension 25 deg, Side Bending to 35 deg bilaterally, Rotation to 35 deg bilaterally  SLR laying: Negative  XSLR laying: Negative  Palpable tenderness: Tender to palpation in the paraspinal musculature lumbar spine more in the thoracolumbar and lumbosacral areas. FABER: Tightness bilaterally. Sensory change: Gross sensation intact to all lumbar and sacral dermatomes.  Reflexes: 2+ at both patellar tendons, 2+ at achilles tendons, Babinski's downgoing.  Strength at foot  Plantar-flexion: 5/5 Dorsi-flexion: 5/5 Eversion: 5/5 Inversion: 5/5  Leg strength  Quad: 5/5 Hamstring: 5/5 Hip flexor: 5/5 Hip abductors: 5/5    Osteopathic findings \ T9 extended rotated and side bent left L2 flexed rotated and side bent right Sacrum right on right    Impression and Recommendations:     This case required medical decision making of moderate complexity. The above documentation has been reviewed and is accurate and complete Lyndal Pulley, DO       Note: This dictation was prepared with Dragon dictation along with smaller phrase technology. Any transcriptional errors that result from this process are unintentional.

## 2018-08-23 ENCOUNTER — Other Ambulatory Visit: Payer: Self-pay

## 2018-08-23 ENCOUNTER — Encounter: Payer: Self-pay | Admitting: Family Medicine

## 2018-08-23 ENCOUNTER — Ambulatory Visit: Payer: Medicare Other | Admitting: Family Medicine

## 2018-08-23 VITALS — BP 126/82 | HR 69 | Ht 67.0 in | Wt 182.0 lb

## 2018-08-23 DIAGNOSIS — S43012D Anterior subluxation of left humerus, subsequent encounter: Secondary | ICD-10-CM

## 2018-08-23 DIAGNOSIS — M5416 Radiculopathy, lumbar region: Secondary | ICD-10-CM

## 2018-08-23 DIAGNOSIS — M48061 Spinal stenosis, lumbar region without neurogenic claudication: Secondary | ICD-10-CM

## 2018-08-23 DIAGNOSIS — M999 Biomechanical lesion, unspecified: Secondary | ICD-10-CM | POA: Diagnosis not present

## 2018-08-23 NOTE — Assessment & Plan Note (Signed)
Decision today to treat with OMT was based on Physical Exam  After verbal consent patient was treated with HVLA, ME, FPR techniques in , thoracic, lumbar and sacral areas  Patient tolerated the procedure well with improvement in symptoms  Patient given exercises, stretches and lifestyle modifications  See medications in patient instructions if given  Patient will follow up in 4-8 weeks 

## 2018-08-23 NOTE — Assessment & Plan Note (Signed)
Patient has responded somewhat to the injection.  We will continue to monitor.  We discussed stability exercises encourage him to do them 2-3 times a week.

## 2018-08-23 NOTE — Patient Instructions (Signed)
Good to see you  COOP pillow or [place] orthopaedic pillow  We will watch the shoulder  See me again in 5ish weeks.  Be safe!

## 2018-08-23 NOTE — Assessment & Plan Note (Signed)
Spinal stenosis.  Seems to be at patient's baseline.  Encourage patient to continue icing regimen, home exercise, which activities to do which wants to avoid.  Encourage core strengthening exercises.  Follow-up again in 4 to 8 weeks

## 2018-08-24 ENCOUNTER — Other Ambulatory Visit: Payer: Self-pay

## 2018-08-24 MED ORDER — PRAVASTATIN SODIUM 40 MG PO TABS: 40.0000 mg | ORAL_TABLET | Freq: Every day | ORAL | 3 refills | Status: DC

## 2018-09-14 ENCOUNTER — Encounter: Payer: Self-pay | Admitting: Family Medicine

## 2018-09-14 ENCOUNTER — Other Ambulatory Visit: Payer: Self-pay | Admitting: Family Medicine

## 2018-09-16 ENCOUNTER — Encounter: Payer: Self-pay | Admitting: *Deleted

## 2018-09-21 ENCOUNTER — Other Ambulatory Visit: Payer: Self-pay

## 2018-09-21 ENCOUNTER — Ambulatory Visit (INDEPENDENT_AMBULATORY_CARE_PROVIDER_SITE_OTHER): Payer: Medicare Other | Admitting: Internal Medicine

## 2018-09-21 ENCOUNTER — Encounter: Payer: Self-pay | Admitting: Internal Medicine

## 2018-09-21 VITALS — Ht 69.0 in | Wt 184.0 lb

## 2018-09-21 DIAGNOSIS — K2100 Gastro-esophageal reflux disease with esophagitis, without bleeding: Secondary | ICD-10-CM

## 2018-09-21 DIAGNOSIS — K5909 Other constipation: Secondary | ICD-10-CM | POA: Diagnosis not present

## 2018-09-21 DIAGNOSIS — K449 Diaphragmatic hernia without obstruction or gangrene: Secondary | ICD-10-CM

## 2018-09-21 NOTE — Progress Notes (Signed)
   Subjective:    Patient ID: Paul [name], male    DOB: Dec 10, 1934, 84 y.o.   MRN: [id number]  This service was provided via telemedicine.   Telephone encounter The patient was located at home The provider was located in provider's GI office. The patient did consent to this telephone visit and is aware of possible charges through their insurance for this visit.   The persons participating in this telemedicine service were the patient and I. Time spent on call: 8 min   HPI [name] is an 84 year old male with a history of GERD with severe reflux esophagitis, hiatal hernia, chronic constipation who seen for follow-up.  Last seen in the office on 03/10/2018.  Seen by telephone visit today in the setting of COVID-19.  After his last visit we decided to repeat an upper endoscopy given his history of esophagitis and his desire to be off PPI if possible.  On 03/30/2018 we performed repeat upper endoscopy which showed LA grade a esophagitis, improved from previously but still present.  There was a 5 cm hiatal hernia and the examined stomach and duodenum were normal.  He reports that he has been well.  He is using pantoprazole 40 mg in the morning and ranitidine 150 mg in the evening.  He spoke to his pharmacy and his pharmacist communicated that his ranitidine was not recalled.  This works better for him than famotidine.  He is taking 150 mg of ranitidine in the evening.  He will periodically 2 times of heartburn breakthrough.  No dysphagia or odynophagia.  He did notice that he had been snacking after dinner and eating shortly before lying down which worsened his symptoms.  He avoids this when possible.  Good appetite.  Bowel movements are regular as long as he takes MiraLAX 17 g in the morning.  He continues to be very active playing golf frequently.  He played this morning.  Review of Systems As per HPI, otherwise negative  Current Medications, Allergies, Past Medical History, Past Surgical  History, Family History and Social History were reviewed in [employer] record.     Objective:   Physical Exam No physical exam, virtual visit       Assessment & Plan:  84 year old male with a history of GERD with severe reflux esophagitis, hiatal hernia, chronic constipation who seen for follow-up.  1.  GERD with a history of esophagitis and hiatal hernia --he still had mild reflux esophagitis on last EGD in December 2019.  This confirms the need for PPI therapy.  He understands and is in agreement.  He also follows his creatinine which is been stable. --Continue pantoprazole 40 mg in the morning, ranitidine 150 mg in the evening --Okay for Tums or Gaviscon over-the-counter per bottle instruction if needed for breakthrough --GERD diet and lifestyle modifications  2.  Chronic constipation --responsive to MiraLAX therapy.  Continue MiraLAX 17 g once daily  3.  Colon cancer screening --negative Cologuard in 2018, can consider repeating in 2021 even after age 34 if he continues to thrive and be as well as he currently is.  6 to 14-month follow-up, sooner if needed

## 2018-09-21 NOTE — Patient Instructions (Signed)
Continue pantoprazole 40 mg in the morning.  Continue ranitidine 150 mg in the evening.  Please purchase the following medications over the counter and take as directed: Tums or Gaviscon as per bottle instruction if needed for breakthrough symptoms.  Please follow a GERD diet and lifestyle modifications.  Continue MiraLAX 17 g once daily  3.  Colon cancer screening --negative Cologuard in 2018; we can consider repeating in 2021 even after age 84 if you continue to thrive and be as well as you currently are.  Please follow up with Dr Rhea Belton in the office in 6-12 months; sooner if needed.

## 2018-09-22 ENCOUNTER — Ambulatory Visit: Payer: Medicare Other | Admitting: Family Medicine

## 2018-09-22 ENCOUNTER — Telehealth: Payer: Self-pay | Admitting: *Deleted

## 2018-09-22 NOTE — Telephone Encounter (Signed)
Pt calls because his temp is 100.  He denies any other covid symptoms and states that he took his temp cause he felt warm.  No appts for this afternoon. Virtual appt made for tomorrow AM with McDiarmid. Red flags given to go to ED. Jone Baseman, CMA

## 2018-09-23 ENCOUNTER — Other Ambulatory Visit: Payer: Self-pay

## 2018-09-23 ENCOUNTER — Telehealth (INDEPENDENT_AMBULATORY_CARE_PROVIDER_SITE_OTHER): Payer: Medicare Other | Admitting: Family Medicine

## 2018-09-23 DIAGNOSIS — R509 Fever, unspecified: Secondary | ICD-10-CM

## 2018-09-23 NOTE — Progress Notes (Signed)
 [facility]icine [facility]  Patient consented to have visit conducted via telephone.  Patient was unable to be contacted through [facility]  Encounter participants: Patient: Paul [name]  Provider: Tawanna Cooler Lindzey Zent  Others (if applicable): none  Chief Complaint: Fever.  Paul [name] wants to know should he be tested for the CoViD virus.   HPI:  Covid-qualifying symptoms - Fever: yes          - Fever description: Yesterday,  Paul [name] felt warm, "my body radiating heat."  He measured his temp at 100.2 F.  His temperature this morning was 98.5 F.  He has a home pulse ox device that read 98% this morning (on room air).            - Rigors: no  - Acute soaking sweats: no  - Shortness of breath at rest (worse from baseline): no  - Shortness of breath with exertion (worse from baseline): no  - Function (impairment in iADLs, ADLs): no, no change in his abilitiy to do complex ADL  - Course of symptoms: completely resolved  - Immunostatus: immunocompetent   CoVid19 Risk factors Travel to CoVid19 high risk areas in last 14 days from date of symptom onset: no  Exposure to laboratory confirmed CoVid19 patient in last 14 days: no  Exposure to a Person Under Investigation (PUI) in last 3 days: No, but 4 days ago he was exposed while golfing to a man whose son had been diagnosed with the CoViD.  This man subsequently tested negative for the CoViD virus.  He had been exposed to this same man 11 days prior while again golfing.    He is careful in practicing social distancing, masking, hand-washing, and cleaning of his gold clubs and bag after use.     Known exposure to any person, including health care worker, who has had close contact with a laboratory-confirmed CoVid19 patient within last 14 days: No, though he had been exposed to a hospital administrator while golfing.  Paul [name] did not know if the administrator had been exposed to CoViD patients    ROS: see  HPI  Pertinent PMHx: No history of organ failure conditions except CKD IIIa.   Exam:  Respiratory: speaking in full sentence, no audible wheeze, voice prosodic   Assessment/Plan:  1) Febrile episode  - Discussed that both a negtive and positive CoViD virus test result would result in recommendation to self-isolate and self-monitor for at least 7 days, so I recommended against testing at this time for this non-specific, isolated febrile episode.   - Recommended that the most conservative approach would be for Paul [name] to self-isolate and self-monitor for at least 7 days or 3 days beyond resolution of symptoms.  Another reasonable option, in my opinion, would be for him to continue his current hygiene practices and self-monitor for symptoms.  Should symptoms arise or the fever return, he should self-isolate and contact our office.   Time spent on phone with patient: 10 minutes

## 2018-09-25 ENCOUNTER — Other Ambulatory Visit: Payer: Self-pay

## 2018-09-25 ENCOUNTER — Ambulatory Visit (HOSPITAL_COMMUNITY)
Admission: EM | Admit: 2018-09-25 | Discharge: 2018-09-25 | Disposition: A | Discharge status: Home / Self Care | Payer: Medicare Other | Attending: Family Medicine | Admitting: Family Medicine

## 2018-09-25 ENCOUNTER — Encounter (HOSPITAL_COMMUNITY): Payer: Self-pay

## 2018-09-25 DIAGNOSIS — M25552 Pain in left hip: Secondary | ICD-10-CM

## 2018-09-25 DIAGNOSIS — M25512 Pain in left shoulder: Secondary | ICD-10-CM

## 2018-09-25 DIAGNOSIS — S50312A Abrasion of left elbow, initial encounter: Secondary | ICD-10-CM

## 2018-09-25 DIAGNOSIS — W19XXXA Unspecified fall, initial encounter: Secondary | ICD-10-CM

## 2018-09-25 MED ORDER — DICLOFENAC SODIUM 1 % TD GEL: 2.0000 g | Freq: Four times a day (QID) | TRANSDERMAL | 1 refills | Status: DC

## 2018-09-25 NOTE — ED Provider Notes (Signed)
[facility]    CSN: [id number] Arrival date & time: 09/25/18  1418     History   Chief Complaint Chief Complaint  Patient presents with  . Fall    HPI Paul RoanDavid Shugars is a [age] y.o. male.   84 year old male comes in for evaluation after fall earlier today. He was walking his dog when dog pulled away, causing him to fall forward. He landed on his left elbow, shoulder, hip. He was able to ambulate on his own after incident. He denies head injury, loss of consciousness. He iced both elbow and hip with some improvement. But as he rested, felt area become more and more stiff and came in for revaluation. He denies radiation of pain, numbness/tingling. States left hip is more sore than the rest of the body. He has not taken any medicine for the symptoms.      Past Medical History:  Diagnosis Date  . Cataract   . GERD (gastroesophageal reflux disease)   . Hiatal hernia   . Hypertension     Patient Active Problem List   Diagnosis Date Noted  . Anterior subluxation of left shoulder 06/22/2018  . Hematoma, subungual, thumb, right, initial encounter 04/13/2018  . Hypotension 04/13/2018  . Hair loss 07/17/2017  . Murmur, cardiac 09/24/2016  . Well adult exam 05/21/2016  . Urge incontinence of urine 05/21/2016  . Spinal stenosis of lumbar region with radiculopathy 08/24/2015  . Arthritis of carpometacarpal joint 03/23/2013  . Nonallopathic lesion of thoracic region 12/16/2012  . Hypothyroidism 04/26/2012  . 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 Wrist pain, chronic, left 10/20/2011  . Nonallopathic lesion of lumbar region 10/24/2010  . Nonallopathic lesion of sacral region 10/24/2010  . Pain in joint, shoulder region 07/16/2010  . CHRONIC KIDNEY DISEASE STAGE III (MODERATE) 03/15/2010  . BACK PAIN, LUMBAR, WITH RADICULOPATHY 08/30/2009  . GERD 03/09/2009  . H/O acute gouty arthritis 05/14/2007  . DEGENERATIVE DISC DISEASE, LUMBAR SPINE 07/24/2006  . Hyperlipidemia 06/25/2006  .  ERECTILE DYSFUNCTION 06/25/2006  . HYPERTENSION, BENIGN SYSTEMIC 06/25/2006  . Asthma 06/25/2006    Past Surgical History:  Procedure Laterality Date  . CATARACT EXTRACTION, BILATERAL    . TOTAL KNEE ARTHROPLASTY     left  . TRANSURETHRAL RESECTION OF PROSTATE  2011       Home Medications    Prior to Admission medications   Medication Sig Start Date End Date Taking? Authorizing Provider  allopurinol (ZYLOPRIM) 100 MG tablet Take 2 tablets (200 mg total) by mouth daily. 11/17/17   Judi SaaSmith, Zachary M, DO  aspirin 81 MG chewable tablet Chew 81 mg by mouth daily.      [provider]  Cholecalciferol (VITAMIN D3 PO) Take 4,000 Units by mouth daily.     [provider]  cloNIDine (CATAPRES - DOSED IN MG/24 HR) 0.3 mg/24hr patch APPLY 1 PATCH EVERY WEEK 09/14/18   Nestor RampNeal, Sara L, MD  Cyanocobalamin (VITAMIN B-12 PO) Take 1 capsule by mouth daily.    [provider]  diclofenac sodium (VOLTAREN) 1 % GEL Apply 2 g topically 4 (four) times daily. 09/25/18   Cathie HoopsYu, Shannin Naab V, PA-C  felodipine (PLENDIL) 10 MG 24 hr tablet TAKE 1 TABLET(10 MG) BY MOUTH DAILY 07/29/18   Nestor RampNeal, Sara L, MD  hydrochlorothiazide (HYDRODIURIL) 25 MG tablet Take one half tablet daily 04/16/18   Nestor RampNeal, Sara L, MD  Multiple Vitamins-Minerals (ICAPS AREDS 2 PO) Take 1 capsule by mouth 2 (two) times a day.  [provider]  MYRBETRIQ 25 MG TB24 tablet TAKE 1 TABLET BY MOUTH ONCE DAILY 05/25/18   [name], MD  pantoprazole (PROTONIX) 40 MG tablet TAKE 1 TABLET BY MOUTH ONCE DAILY 04/02/18   Pyrtle, Carie Caddy, MD  polyethylene glycol powder (GLYCOLAX/MIRALAX) powder TAKE 17GM(DISSOLVED IN WATER) BY MOUTH TWICE A DAY AT 10 PM AND 5 PM Patient taking differently: TAKE 17GM(DISSOLVED IN WATER) BY MOUTH ONCE DAILY 05/28/18   [name], MD  pravastatin (PRAVACHOL) 40 MG tablet Take 1 tablet (40 mg total) by mouth daily. 08/24/18   Moses Manners, MD  Pyridoxine HCl (VITAMIN B6 PO) Take 1 capsule by mouth  daily.    [provider]  sildenafil (REVATIO) 20 MG tablet TAKE 1 TABLET BY MOUTH AS DIRECTED FOR ERECTILE DYSFUNCTION 05/28/18   [name], MD    Family History Family History  Problem Relation Age of Onset  . Diabetes Mother   . Kidney disease Father   . Cancer Sister        ? pancreatic  . Heart disease Brother   . Cancer Sister        ? pancreatic  . Colon cancer Neg Hx   . Stomach cancer Neg Hx   . Rectal cancer Neg Hx   . Esophageal cancer Neg Hx   . Liver cancer Neg Hx     Social History Social History   Tobacco Use  . Smoking status: Former Smoker    Last attempt to quit: 04/28/1972    Years since quitting: 46.4  . Smokeless tobacco: Never Used  Substance Use Topics  . Alcohol use: Yes    Alcohol/week: 7.0 standard drinks    Types: 7 Standard drinks or equivalent per week    Comment: 1 glass of wine a night  . Drug use: No     Allergies   Ace inhibitors and Lisinopril   Review of Systems Review of Systems  Reason unable to perform ROS: See HPI as above.     Physical Exam Triage Vital Signs ED Triage Vitals  Enc Vitals Group     BP 09/25/18 1454 132/70     Pulse Rate 09/25/18 1454 68     Resp 09/25/18 1454 20     Temp 09/25/18 1454 98.5 F (36.9 C)     Temp src --      SpO2 09/25/18 1454 99 %     Weight --      Height --      Head Circumference --      Peak Flow --      Pain Score 09/25/18 1452 7     Pain Loc --      Pain Edu? --      Excl. in GC? --    No data found.  Updated Vital Signs BP 132/70   Pulse 68   Temp 98.5 F (36.9 C)   Resp 20   SpO2 99%   Physical Exam Constitutional:      General: He is not in acute distress.    Appearance: He is well-developed. He is not ill-appearing, toxic-appearing or diaphoretic.  HENT:     Head: Normocephalic and atraumatic.  Eyes:     Conjunctiva/sclera: Conjunctivae normal.     Pupils: Pupils are equal, round, and reactive to light.  Neck:     Musculoskeletal: Normal  range of motion and neck supple. No pain with movement, spinous process tenderness or muscular tenderness.  Cardiovascular:  Rate and Rhythm: Normal rate and regular rhythm.  Pulmonary:     Effort: Pulmonary effort is normal. No respiratory distress.     Breath sounds: Normal breath sounds. No wheezing.  Musculoskeletal:     Comments: Abrasions to the left distal fingers and left elbow. Patient has fingers dressed in band-aid. States "just scrapes" and did not need to remove for evaluation. Abrasion to the left elbow without bleeding. No swelling, contusion to the shoulder, elbow. Tenderness to palpation of left parascapular area. No bony tenderness. No tenderness to the clavicle. Full ROM of neck, shoulder, elbow, wrist, fingers. Strength normal and equal bilaterally. Sensation intact and equal bilaterally. Radial pulse 2+, cap refill <2s.  Left hip without contusion, swelling, abrasions. Tenderness to palpation of lateral hip. Full ROM of hip. Strength normal and equal bilaterally. Sensation intact and equal bilaterally.   Neurological:     Mental Status: He is alert and oriented to person, place, and time.      UC Treatments / Results  Labs (all labs ordered are listed, but only abnormal results are displayed) Labs Reviewed - No data to display  EKG None  Radiology No results found.  Procedures Procedures (including critical care time)  Medications Ordered in UC Medications - No data to display  Initial Impression / Assessment and Plan / UC Course  I have reviewed the triage vital signs and the nursing notes.  Pertinent labs & imaging results that were available during my care of the patient were reviewed by me and considered in my medical decision making (see chart for details).    No alarming signs on exam.  No indications for x-ray at this time given full range of motion and good strength.  Patient worries for shoulder dislocation, given recent history of one on the  left shoulder.  However, currently patient without bony tenderness to the shoulder, no deformity, and has full range of motion of shoulder.  Reassurance provided.  Given patient with history of CKD, will provide Voltaren gel and have patient take Tylenol to help with pain.  Continue ice compress.  Discussed pain may increase the first 24 to 72 hours after fall.  Return precautions given.  Patient expresses understanding and agrees to plan.  Final Clinical Impressions(s) / UC Diagnoses   Final diagnoses:  Abrasion of left elbow, initial encounter  Acute pain of left shoulder  Left hip pain  Fall, initial encounter   ED Prescriptions    Medication Sig Dispense Auth. Provider   diclofenac sodium (VOLTAREN) 1 % GEL Apply 2 g topically 4 (four) times daily. 1 Tube Th[place], [place] 09/25/18 1530

## 2018-09-25 NOTE — Discharge Instructions (Signed)
No alarming signs on exam. No indications of xray given can move joints normally and good strength. Use voltaren gel on affected area and tylenol to help with pain. Continue to ice compress. Pain can get worse the first 24-72 hours after the fall. Follow up with PCP if symptoms not improving.

## 2018-09-25 NOTE — ED Triage Notes (Signed)
Was walking dog today when dog darted away and pulled him down, fell on left shoulder and left hip

## 2018-09-27 ENCOUNTER — Encounter: Payer: Self-pay | Admitting: Sports Medicine

## 2018-09-27 ENCOUNTER — Other Ambulatory Visit: Payer: Self-pay

## 2018-09-27 ENCOUNTER — Ambulatory Visit: Payer: Medicare Other | Admitting: Family Medicine

## 2018-09-27 ENCOUNTER — Ambulatory Visit (INDEPENDENT_AMBULATORY_CARE_PROVIDER_SITE_OTHER): Payer: Medicare Other | Admitting: Sports Medicine

## 2018-09-27 VITALS — BP 138/62 | Ht 69.0 in | Wt 184.0 lb

## 2018-09-27 DIAGNOSIS — S7002XD Contusion of left hip, subsequent encounter: Secondary | ICD-10-CM

## 2018-09-27 DIAGNOSIS — S7002XA Contusion of left hip, initial encounter: Secondary | ICD-10-CM | POA: Insufficient documentation

## 2018-09-27 NOTE — Progress Notes (Addendum)
Subjective:    Paul [name] - 84 y.o. male MRN [id number]  Date of birth: 05-06-1934  CC:  Paul [name] is here for left shoulder, elbow, and hip pain following a fall on 09/25/2018.    HPI: He was walking his dog when his dog pulled away from him, causing him to fall on his left side.  He is unsure whether he hit his head or not and is unsure whether he lost consciousness for a second or two.  He had difficulty ambulating shortly after the fall and ice to the affected joints.  He experienced some nausea but denies any vomiting or vision changes.  He then went to urgent care, where a thorough examination determined that he likely did not sustain any fractures or intracranial injury.  He was prescribed Voltaren gel and told to take Tylenol and apply ice for pain relief as well.  He has followed these recommendations and his pain has improved since 5/30.  He has also done some tai chi and range of motion exercises, and his ambulation has improved, although he feels some pain when transferring weight from his heel to his toe on the left foot.  The most painful area for him now is his lateral left hip.  He denies any bruising or pain in his groin.   Health Maintenance: There are no preventive care reminders to display for this patient.  -  reports that he quit smoking about 46 years ago. He has never used smokeless tobacco. - Review of Systems: Per HPI. - Past Medical History: Patient Active Problem List   Diagnosis Date Noted  . Contusion of left hip 09/27/2018  . Anterior subluxation of left shoulder 06/22/2018  . Hematoma, subungual, thumb, right, initial encounter 04/13/2018  . Hypotension 04/13/2018  . Hair loss 07/17/2017  . Murmur, cardiac 09/24/2016  . Well adult exam 05/21/2016  . Urge incontinence of urine 05/21/2016  . Spinal stenosis of lumbar region with radiculopathy 08/24/2015  . Arthritis of carpometacarpal joint 03/23/2013  . Nonallopathic lesion of thoracic region  12/16/2012  . Hypothyroidism 04/26/2012  . [place] arthritis 10/21/2011  . Wrist pain, chronic, left 10/20/2011  . Nonallopathic lesion of lumbar region 10/24/2010  . Nonallopathic lesion of sacral region 10/24/2010  . Pain in joint, shoulder region 07/16/2010  . CHRONIC KIDNEY DISEASE STAGE III (MODERATE) 03/15/2010  . BACK PAIN, LUMBAR, WITH RADICULOPATHY 08/30/2009  . GERD 03/09/2009  . H/O acute gouty arthritis 05/14/2007  . DEGENERATIVE DISC DISEASE, LUMBAR SPINE 07/24/2006  . Hyperlipidemia 06/25/2006  . ERECTILE DYSFUNCTION 06/25/2006  . HYPERTENSION, BENIGN SYSTEMIC 06/25/2006  . Asthma 06/25/2006   - Medications: reviewed and updated   Objective:   Physical Exam BP 138/62   Ht _0  (1.753 m)   Wt 184 lb (83.5 kg)   BMI 27.17 kg/m  Gen: NAD, alert, cooperative with exam, well-appearing, pleasant Skin: Healing scab on left elbow, no other ecchymosis noted Musculoskeletal: No visual deformity of affected joints, no effusion noted on left elbow.  Nontender to palpation of the left shoulder and elbow, although there is tenderness to palpation along the area of the left greater trochanter.  Range of motion is normal on the left shoulder, elbow, and hip.  Normal strength of abduction and external rotation of the left shoulder, empty can test negative.  No pain on passive internal and external rotation of left hip.  Gait is slow due to pain but otherwise normal. Neuro: no gross deficits.  Assessment & Plan:   Contusion of left hip Due to lack of groin pain, lack of pain with passive internal and external rotation of left hip, and improving symptoms, patient's hip pain stems from a contusion rather than a fracture.  Patient was encouraged to continue Voltaren gel and Tylenol as needed and to continue to apply ice as needed.  He was also encouraged to continue tai chi as able and to use pain as a guide for his activity level.  He was advised to call our office if he  develops worsening pain or does not continue to improve over the next week.  Left shoulder and left elbow appear to be healing well without signs of dislocation or rotator cuff injury.  Patient was advised that they should continue to heal for the next week.  Maia Breslow, M.D. 09/27/2018, 10:52 AM PGY-2, [facility]  Patient seen and evaluated with the resident.  I agree with the above plan of care.  Patient has excellent shoulder range of motion and good rotator cuff strength.  Left elbow has a healing eschar but full range of motion, no soft tissue swelling, and no tenderness to palpation.  His left hip is most tender along the lateral aspect of the tip, particularly over the greater trochanter.  He does not have any obvious ecchymosis or soft tissue swelling.  No palpable hematoma.  Patient denies groin pain and has no pain with passive internal and external rotation of the hip.  Reassurance given and the patient will increase activity as tolerated.  If hip pain persists, the patient will notify me and I will reconsider x-rays at that time.  Follow-up for ongoing or recalcitrant issues.

## 2018-09-27 NOTE — Assessment & Plan Note (Signed)
Due to lack of groin pain, lack of pain with passive internal and external rotation of left hip, and improving symptoms, patient's hip pain stems from a contusion rather than a fracture.  Patient was encouraged to continue Voltaren gel and Tylenol as needed and to continue to apply ice as needed.  He was also encouraged to continue tai chi as able and to use pain as a guide for his activity level.  He was advised to call our office if he develops worsening pain or does not continue to improve over the next week.

## 2018-10-01 ENCOUNTER — Other Ambulatory Visit (INDEPENDENT_AMBULATORY_CARE_PROVIDER_SITE_OTHER): Payer: Medicare Other

## 2018-10-01 ENCOUNTER — Encounter: Payer: Self-pay | Admitting: Family Medicine

## 2018-10-01 ENCOUNTER — Ambulatory Visit (INDEPENDENT_AMBULATORY_CARE_PROVIDER_SITE_OTHER): Payer: Medicare Other | Admitting: Family Medicine

## 2018-10-01 ENCOUNTER — Other Ambulatory Visit: Payer: Self-pay

## 2018-10-01 VITALS — BP 122/64 | HR 72 | Ht 69.0 in | Wt 184.0 lb

## 2018-10-01 DIAGNOSIS — M48061 Spinal stenosis, lumbar region without neurogenic claudication: Secondary | ICD-10-CM | POA: Diagnosis not present

## 2018-10-01 DIAGNOSIS — M5416 Radiculopathy, lumbar region: Secondary | ICD-10-CM | POA: Diagnosis not present

## 2018-10-01 DIAGNOSIS — M2550 Pain in unspecified joint: Secondary | ICD-10-CM | POA: Diagnosis not present

## 2018-10-01 DIAGNOSIS — M999 Biomechanical lesion, unspecified: Secondary | ICD-10-CM

## 2018-10-01 LAB — IBC PANEL
Iron: 71 ug/dL (ref 42–165)
Saturation Ratios: 28.5 % (ref 20.0–50.0)
Transferrin: 178 mg/dL — ABNORMAL LOW (ref 212.0–360.0)

## 2018-10-01 LAB — URIC ACID: Uric Acid, Serum: 5.8 mg/dL (ref 4.0–7.8)

## 2018-10-01 LAB — CBC WITH DIFFERENTIAL/PLATELET
Basophils Absolute: 0 10*3/uL (ref 0.0–0.1)
Basophils Relative: 0.3 % (ref 0.0–3.0)
Eosinophils Absolute: 0.2 10*3/uL (ref 0.0–0.7)
Eosinophils Relative: 2.8 % (ref 0.0–5.0)
HCT: 37.1 % — ABNORMAL LOW (ref 39.0–52.0)
Hemoglobin: 12.8 g/dL — ABNORMAL LOW (ref 13.0–17.0)
Lymphocytes Relative: 36.3 % (ref 12.0–46.0)
Lymphs Abs: 2.4 10*3/uL (ref 0.7–4.0)
MCHC: 34.6 g/dL (ref 30.0–36.0)
MCV: 83.2 fl (ref 78.0–100.0)
Monocytes Absolute: 0.6 10*3/uL (ref 0.1–1.0)
Monocytes Relative: 9.4 % (ref 3.0–12.0)
Neutro Abs: 3.4 10*3/uL (ref 1.4–7.7)
Neutrophils Relative %: 51.2 % (ref 43.0–77.0)
Platelets: 195 10*3/uL (ref 150.0–400.0)
RBC: 4.46 Mil/uL (ref 4.22–5.81)
RDW: 16 % — ABNORMAL HIGH (ref 11.5–15.5)
WBC: 6.7 10*3/uL (ref 4.0–10.5)

## 2018-10-01 LAB — T3, FREE: T3, Free: 2.8 pg/mL (ref 2.3–4.2)

## 2018-10-01 LAB — SEDIMENTATION RATE: Sed Rate: 51 mm/hr — ABNORMAL HIGH (ref 0–20)

## 2018-10-01 LAB — TSH: TSH: 5.85 u[IU]/mL — ABNORMAL HIGH (ref 0.35–4.50)

## 2018-10-01 LAB — T4, FREE: Free T4: 0.95 ng/dL (ref 0.60–1.60)

## 2018-10-01 LAB — FERRITIN: Ferritin: 186 ng/mL (ref 22.0–322.0)

## 2018-10-01 NOTE — Progress Notes (Signed)
[name] [address] [place], [place] [address] Phone: [phone] Subjective:   [name] am serving as a Neurosurgeon for Dr. Antoine Primas.  I'm seeing this patient by the request  of:    CC: Low back pain  BJY:[id number]  Paul [name] is a 84 y.o. male coming in with complaint of back pain. Back is doing well.  Known spinal stenosis.  Has been doing relatively well with conservative therapy.  Recently did have a fall when walking his dog.  Contusion on the lateral hip.  Seen by sports medicine.  Discussed using a topical anti-inflammatory and icing regimen.  Patient is improving but very slowly.     Past Medical History:  Diagnosis Date  . Cataract   . GERD (gastroesophageal reflux disease)   . Hiatal hernia   . Hypertension    Past Surgical History:  Procedure Laterality Date  . CATARACT EXTRACTION, BILATERAL    . TOTAL KNEE ARTHROPLASTY     left  . TRANSURETHRAL RESECTION OF PROSTATE  2011   Social History   Socioeconomic History  . Marital status: Legally Separated    Spouse name: Not on file  . Number of children: 0  . Years of education: Not on file  . Highest education level: Not on file  Occupational History  . 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 Financial resource strain: Not on file  . Food insecurity:    Worry: Not on file    Inability: Not on file  . Transportation needs:    Medical: Not on file    Non-medical: Not on file  Tobacco Use  . Smoking status: Former Smoker    Last attempt to quit: 04/28/1972    Years since quitting: 46.4  . Smokeless tobacco: Never Used  Substance and Sexual Activity  . Alcohol use: Yes    Alcohol/week: 7.0 standard drinks    Types: 7 Standard drinks or equivalent per week    Comment: 1 glass of wine a night  . Drug use: No  . Sexual activity: Yes  Lifestyle  . Physical activity:    Days per  week: Not on file    Minutes per session: Not on file  . Stress: Not on file  Relationships  . Social connections:    Talks on phone: Not on file    Gets together: Not on file    Attends religious service: Not on file    Active member of club or organization: Not on file    Attends meetings of clubs or organizations: Not on file    Relationship status: Not on file  Other Topics Concern  . Not on file  Social History Narrative   Health Care POA:    Emergency Contact:    End of Life Plan:    Who lives with you: Lives by himself in 1 story home.    Any pets: none   Diet: Patient has a varied diet of protein, starch, and vegetables.  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   Exercise: Patient golfs several times a week and does yoga at home 2x week.    Seatbelts: Patient reports wearing seat belt when in vehicle.   [name] Exposure/Protection: Patient reports wearing sun screen intermittently.    Hobbies: golfing, teaching at [employer]      Allergies  Allergen  Reactions  . Ace Inhibitors Swelling    After taking for long time had angioedema on lisinopril  . Lisinopril     Other reaction(s): Facial swelling   Family History  Problem Relation Age of Onset  . Diabetes Mother   . Kidney disease Father   . Cancer Sister        ? pancreatic  . Heart disease Brother   . Cancer Sister        ? pancreatic  . Colon cancer Neg Hx   . Stomach cancer Neg Hx   . Rectal cancer Neg Hx   . Esophageal cancer Neg Hx   . Liver cancer Neg Hx      Current Outpatient Medications (Cardiovascular):  .  cloNIDine (CATAPRES - DOSED IN MG/24 HR) 0.3 mg/24hr patch, APPLY 1 PATCH EVERY WEEK .  felodipine (PLENDIL) 10 MG 24 hr tablet, TAKE 1 TABLET(10 MG) BY MOUTH DAILY .  hydrochlorothiazide (HYDRODIURIL) 25 MG tablet, Take one half tablet daily .  pravastatin (PRAVACHOL) 40 MG tablet, Take 1 tablet (40 mg total) by mouth daily. .  sildenafil (REVATIO) 20 MG tablet, TAKE  1 TABLET BY MOUTH AS DIRECTED FOR ERECTILE DYSFUNCTION   Current Outpatient Medications (Analgesics):  .  allopurinol (ZYLOPRIM) 100 MG tablet, Take 2 tablets (200 mg total) by mouth daily. Marland Kitchen.  aspirin 81 MG chewable tablet, Chew 81 mg by mouth daily.    Current Outpatient Medications (Hematological):  Marland Kitchen.  Cyanocobalamin (VITAMIN B-12 PO), Take 1 capsule by mouth daily.  Current Outpatient Medications (Other):  Marland Kitchen.  Cholecalciferol (VITAMIN D3 PO), Take 4,000 Units by mouth daily.  .  diclofenac sodium (VOLTAREN) 1 % GEL, Apply 2 g topically 4 (four) times daily. .  Multiple Vitamins-Minerals (ICAPS AREDS 2 PO), Take 1 capsule by mouth 2 (two) times a day. Marland Kitchen.  MYRBETRIQ 25 MG TB24 tablet, TAKE 1 TABLET BY MOUTH ONCE DAILY .  pantoprazole (PROTONIX) 40 MG tablet, TAKE 1 TABLET BY MOUTH ONCE DAILY .  polyethylene glycol powder (GLYCOLAX/MIRALAX) powder, TAKE 17GM(DISSOLVED IN WATER) BY MOUTH TWICE A DAY AT 10 PM AND 5 PM (Patient taking differently: TAKE 17GM(DISSOLVED IN WATER) BY MOUTH ONCE DAILY) .  Pyridoxine HCl (VITAMIN B6 PO), Take 1 capsule by mouth daily.    Past medical history, social, surgical and family history all reviewed in electronic medical record.  No pertanent information unless stated regarding to the chief complaint.   Review of Systems:  No headache, visual changes, nausea, vomiting, diarrhea, constipation, dizziness, abdominal pain, skin rash, fevers, chills, night swe chest pain, shortness of breath, mood changes.  Positive muscle aches  Objective  Blood pressure 122/64, pulse 72, height 5\' 9"  (1.753 m), weight 184 lb (83.5 kg), SpO2 98 %.    General: No apparent distress alert and oriented x3 mood and affect normal, dressed appropriately.  HEENT: Pupils equal, extraocular movements intact  Respiratory: Patient's speak in full sentences and does not appear short of breath  Cardiovascular: No lower extremity edema, non tender, no erythema  Skin: Warm dry intact with  no signs of infection or rash on extremities or on axial skeleton.  Abdomen: Soft nontender  Neuro: Cranial nerves II through XII are intact, neurovascularly intact in all extremities with 2+ DTRs and 2+ pulses.  Lymph: No lymphadenopathy of posterior or anterior cervical chain or axillae bilaterally.  Gait mild antalgic MSK: Arthritic changes of multiple joints patient does have some mild loss of lordosis with degenerative scoliosis.  Tightness with straight  leg test.  Patient's left hip does show a large contusion but seems to be resolving.  No hematoma noted.  Tender to palpation over the lateral aspect of the hip but no groin pain.  Full internal range of motion noted  Osteopathic findings C6 flexed rotated and side bent left T3 extended rotated and side bent right inhaled third rib T9 extended rotated and side bent left L2 flexed rotated and side bent right Sacrum right on right       Impression and Recommendations:     This case required medical decision making of moderate complexity. The above documentation has been reviewed and is accurate and complete [name], DO       Note: This dictation was prepared with Dragon dictation along with smaller phrase technology. Any transcriptional errors that result from this process are unintentional.

## 2018-10-01 NOTE — Progress Notes (Unsigned)
Testosterone, free, total

## 2018-10-01 NOTE — Patient Instructions (Signed)
Good to see you.  Paul [name] See me again in 4-6 weeks

## 2018-10-01 NOTE — Assessment & Plan Note (Signed)
Patient has been doing remarkably well overall.  We discussed icing regimen and home exercise.  We discussed which activities of doing which wants to avoid.  Patient is to increase activity slowly over the course the next several days again.  Patient is even played golf since the accident.  Patient did though have a contusion on the left side of the hip.  Discussed Arnica.  Follow-up again in 4 to 6 weeks

## 2018-10-01 NOTE — Assessment & Plan Note (Signed)
Decision today to treat with OMT was based on Physical Exam  After verbal consent patient was treated with HVLA, ME, FPR techniques in cervical, thoracic, lumbar and sacral areas  Patient tolerated the procedure well with improvement in symptoms  Patient given exercises, stretches and lifestyle modifications  See medications in patient instructions if given  Patient will follow up in 4 weeks 

## 2018-10-04 LAB — VITAMIN D 1,25 DIHYDROXY
Vitamin D 1, 25 (OH)2 Total: 53 pg/mL (ref 18–72)
Vitamin D2 1, 25 (OH)2: <8 pg/mL
Vitamin D3 1, 25 (OH)2: 53 pg/mL

## 2018-10-04 LAB — PTH, INTACT AND CALCIUM
Calcium: 9.4 mg/dL (ref 8.6–10.3)
PTH: 21 pg/mL (ref 14–64)

## 2018-10-05 LAB — TESTOSTERONE, FREE, TOTAL, SHBG
Sex Hormone Binding: 54.2 nmol/L (ref 19.3–76.4)
Testosterone, Free: 3.8 pg/mL — ABNORMAL LOW (ref 6.6–18.1)
Testosterone: 315 ng/dL (ref 264–916)

## 2018-10-11 ENCOUNTER — Ambulatory Visit
Admission: RE | Admit: 2018-10-11 | Discharge: 2018-10-11 | Disposition: A | Discharge status: Home / Self Care | Payer: Medicare Other | Source: Ambulatory Visit | Attending: Sports Medicine | Admitting: Sports Medicine

## 2018-10-11 ENCOUNTER — Encounter: Payer: Self-pay | Admitting: Sports Medicine

## 2018-10-11 ENCOUNTER — Other Ambulatory Visit: Payer: Self-pay

## 2018-10-11 ENCOUNTER — Ambulatory Visit (INDEPENDENT_AMBULATORY_CARE_PROVIDER_SITE_OTHER): Payer: Medicare Other | Admitting: Sports Medicine

## 2018-10-11 VITALS — BP 138/70 | Ht 69.0 in | Wt 184.0 lb

## 2018-10-11 DIAGNOSIS — S20221A Contusion of right back wall of thorax, initial encounter: Secondary | ICD-10-CM | POA: Diagnosis not present

## 2018-10-11 DIAGNOSIS — M25551 Pain in right hip: Secondary | ICD-10-CM | POA: Diagnosis not present

## 2018-10-11 NOTE — Progress Notes (Signed)
Subjective:    Paul [name] - 84 y.o. male MRN [id number]  Date of birth: 1935-02-16  CC:  Paul [name] is here for a new complaint of right hip pain.  HPI: Patient reports that on Saturday, 6/13, he was pressure washing his swimming pool, when his right foot caught on the edge of the pool and he fell backwards, hitting his lower back on the concrete.  He was able to get back up and continue pressure washing the pool, but he felt significant pain described as "burning" for the rest of the day.  He denies hitting his head during this fall.  His pain kept him up during the following night, and he noticed bruising the following day.  He notes that his pain is worse on his right lumbar paraspinal region, and it radiates inferiorly and laterally, although he denies groin pain.  He has been able to walk his dog for about a mile each day and to continue doing tai chi.  He does note some pain on lumbar extension and flexion and cannot sleep on his right side following this injury.  He thinks that his pain may slowly be improving, but his progress is so slow that it is hard for him to tell.  He does not think that he has balance issues overall because he regularly practices tai chi and does well during these routines.  Health Maintenance:  There are no preventive care reminders to display for this patient.  -  reports that he quit smoking about 46 years ago. He has never used smokeless tobacco. - Review of Systems: Per HPI. - Past Medical History: Patient Active Problem List   Diagnosis Date Noted  . Contusion of right side of back 10/11/2018  . Contusion of left hip 09/27/2018  . Anterior subluxation of left shoulder 06/22/2018  . Hematoma, subungual, thumb, right, initial encounter 04/13/2018  . Hypotension 04/13/2018  . Hair loss 07/17/2017  . Murmur, cardiac 09/24/2016  . Well adult exam 05/21/2016  . Urge incontinence of urine 05/21/2016  . Spinal stenosis of lumbar region with radiculopathy  08/24/2015  . Arthritis of carpometacarpal joint 03/23/2013  . Nonallopathic lesion of thoracic region 12/16/2012  . Hypothyroidism 04/26/2012  . [place] arthritis 10/21/2011  . Wrist pain, chronic, left 10/20/2011  . Nonallopathic lesion of lumbar region 10/24/2010  . Nonallopathic lesion of sacral region 10/24/2010  . Pain in joint, shoulder region 07/16/2010  . CHRONIC KIDNEY DISEASE STAGE III (MODERATE) 03/15/2010  . BACK PAIN, LUMBAR, WITH RADICULOPATHY 08/30/2009  . GERD 03/09/2009  . H/O acute gouty arthritis 05/14/2007  . DEGENERATIVE DISC DISEASE, LUMBAR SPINE 07/24/2006  . Hyperlipidemia 06/25/2006  . ERECTILE DYSFUNCTION 06/25/2006  . HYPERTENSION, BENIGN SYSTEMIC 06/25/2006  . Asthma 06/25/2006   - Medications: reviewed and updated   Objective:   Physical Exam BP 138/70   Ht '5\' 9"'$  (1.753 m)   Wt 184 lb (83.5 kg)   BMI 27.17 kg/m  Gen: NAD, alert, cooperative with exam, well-appearing Musculoskeletal: Lumbar spine: Mild bruising and swelling noted to the right of his lumbar spine.  No other visual abnormalities noted.  Patient has no point tenderness to palpation of his spine but does note some tenderness to palpation of the right side of his lumbar paraspinal region.  Normal range of motion on lumbar flexion, extension, side bending, and lateral rotation, although some pain is elicited during side bending and flexion.  Normal gait without Trendelenburg.  Neurovascularly intact. Right hip: No visual abnormality.  Nontender to palpation.  No pain on active or passive external or internal rotation of the femur.  Neurovascularly intact. Left hip:  No visual abnormality.  Nontender to palpation.  No pain on active or passive external or internal rotation of the femur.  Neurovascularly intact.         Assessment & Plan:   Contusion of right side of back Patient likely has bruising in this area without fracture, but given his age, we will obtain 2 view films of his lumbar  spine and an AP film of his pelvis to rule out fracture.  Hip fracture is very unlikely since he has no pain on external or internal rotation of his right femur.  Since this is his second fall in the past 2 weeks, we discussed with him the possibility that he would benefit from additional physical therapy to improve his balance, but patient declined this since he avidly does tai chi.  We did encourage him to practice standing from a seated position without assistance from his upper body to continue to strengthen his quadriceps muscles.  We will discuss his x-ray results with him when they return and follow-up as needed.    Maia Breslow, M.D. 10/11/2018, 11:29 AM PGY-2, [facility]  Patient seen and evaluated with the resident.  I agree with the above plan of care.  X-rays were reviewed.  There is no sign of pelvic fracture or fracture of the lumbar spine although he does have significant multilevel spondylosis of his lumbar spine.  Treatment as above with a watchful waiting approach.  If symptoms persist then consider further diagnostic imaging.  Since this is his second fall in a matter of weeks, we did discuss physical therapy to address his balance issues but he is adamant that his tai chi is adequate enough.  Follow-up for ongoing or recalcitrant issues.

## 2018-10-11 NOTE — Assessment & Plan Note (Addendum)
Patient likely has bruising in this area without fracture, but given his age, we will obtain 2 view films of his lumbar spine and an AP film of his pelvis to rule out fracture.  Hip fracture is very unlikely since he has no pain on external or internal rotation of his right femur.  Since this is his second fall in the past 2 weeks, we discussed with him the possibility that he would benefit from additional physical therapy to improve his balance, but patient declined this since he avidly does tai chi.  We did encourage him to practice standing from a seated position without assistance from his upper body to continue to strengthen his quadriceps muscles.  We will discuss his x-ray results with him when they return and follow-up as needed.

## 2018-10-12 ENCOUNTER — Encounter: Payer: Self-pay | Admitting: Sports Medicine

## 2018-10-13 ENCOUNTER — Encounter: Payer: Self-pay | Admitting: Family Medicine

## 2018-10-13 ENCOUNTER — Other Ambulatory Visit: Payer: Self-pay

## 2018-10-13 ENCOUNTER — Ambulatory Visit (INDEPENDENT_AMBULATORY_CARE_PROVIDER_SITE_OTHER): Payer: Medicare Other | Admitting: Family Medicine

## 2018-10-13 ENCOUNTER — Ambulatory Visit: Payer: Medicare Other | Admitting: Family Medicine

## 2018-10-13 DIAGNOSIS — S20221A Contusion of right back wall of thorax, initial encounter: Secondary | ICD-10-CM

## 2018-10-13 MED ORDER — TIZANIDINE HCL 2 MG PO CAPS: 2.0000 mg | ORAL_CAPSULE | Freq: Every day | ORAL | 0 refills | Status: DC

## 2018-10-13 NOTE — Patient Instructions (Signed)
Good to see you  Definitely a very bad bruise and bone bruise  Ice 20 minutes 4 times a day at least  pennsaid pinkie amount topically 2 times daily as needed.  Arnica lotion as well for the bruising,  See me again in 2 weeks

## 2018-10-13 NOTE — Assessment & Plan Note (Signed)
Significant bruising noted.  Likely worsening.  4 days out.  History of chronic kidney disease.  Discussed with him about the possibility of laboratory work-up if he is concerned that patient declined.  Muscle relaxer given low dose at night.  Icing regimen on a more regular basis.  Follow-up again in 2-3 weeks

## 2018-10-13 NOTE — Progress Notes (Signed)
[name] [place], [place] [address] Phone: [phone] Subjective:   Paul [name], am serving as a scribe for Dr. Hulan Saas. The CC: Back pain  [id number]  Paul [name] is a 84 y.o. male coming in with complaint of back pain. Fell on Sunday. Pain in lower right side of back. Denies any radiating symptoms.  Saw another provider and did have x-rays taken.  These were independently visualized by me.  Shows usual severe degenerative disc disease but [name] acute fractures.  Patient states he continues pain.  Since this is localized.  Denies any radiation of pain.  Feels like anything that seems to be getting worse with a severity of pain is 7 out of 10     Past Medical History:  Diagnosis Date  . Cataract   . GERD (gastroesophageal reflux disease)   . Hiatal hernia   . Hypertension    Past Surgical History:  Procedure Laterality Date  . CATARACT EXTRACTION, BILATERAL    . TOTAL KNEE ARTHROPLASTY     left  . TRANSURETHRAL RESECTION OF PROSTATE  2011   Social History   Socioeconomic History  . Marital status: Legally Separated    Spouse name: Not on file  . Number of children: 0  . Years of education: Not on file  . Highest education level: Not on file  Occupational History  . Occupation: retired    Fish farm manager: [employer]: [place]  . Financial resource strain: Not on file  . Food insecurity    Worry: Not on file    Inability: Not on file  . Transportation needs    Medical: Not on file    Non-medical: Not on file  Tobacco Use  . Smoking status: Former Smoker    Quit date: 04/28/1972    Years since quitting: 46.4  . Smokeless tobacco: Never Used  Substance and Sexual Activity  . Alcohol use: Yes    Alcohol/week: 7.0 standard drinks    Types: 7 Standard drinks or equivalent per week    Comment: 1 glass of wine a night  . Drug use: [name]  . Sexual activity:  Yes  Lifestyle  . Physical activity    Days per week: Not on file    Minutes per session: Not on file  . Stress: Not on file  Relationships  . Social Herbalist on phone: Not on file    Gets together: Not on file    Attends religious service: Not on file    Active member of club or organization: Not on file    Attends meetings of clubs or organizations: Not on file    Relationship status: Not on file  Other Topics Concern  . Not on file  Social History Narrative   Health Care POA:    Emergency Contact:    End of Life Plan:    Who lives with you: Lives by himself in 1 story home.    Any pets: none   Diet: Patient has a varied diet of protein, starch, and vegetables.  Is currently working with [facility] for nutrition.   Exercise: Patient golfs several times a week and does yoga at home 2x week.    Seatbelts: Patient reports wearing seat belt when in vehicle.   Nancy Fetter Exposure/Protection: Patient reports wearing sun screen intermittently.    Hobbies: golfing, teaching at [employer]      Allergies  Allergen Reactions  . Ace Inhibitors Swelling    After taking for long time had angioedema on lisinopril  . Lisinopril     Other reaction(s): Facial swelling   Family History  Problem Relation Age of Onset  . Diabetes Mother   . Kidney disease Father   . Cancer Sister        ? pancreatic  . Heart disease Brother   . Cancer Sister        ? pancreatic  . Colon cancer Neg Hx   . Stomach cancer Neg Hx   . Rectal cancer Neg Hx   . Esophageal cancer Neg Hx   . Liver cancer Neg Hx      Current Outpatient Medications (Cardiovascular):  .  cloNIDine (CATAPRES - DOSED IN MG/24 HR) 0.3 mg/24hr patch, APPLY 1 PATCH EVERY WEEK .  felodipine (PLENDIL) 10 MG 24 hr tablet, TAKE 1 TABLET(10 MG) BY MOUTH DAILY .  hydrochlorothiazide (HYDRODIURIL) 25 MG tablet, Take one half tablet daily .  pravastatin (PRAVACHOL) 40 MG tablet, Take 1 tablet (40 mg total) by mouth  daily. .  sildenafil (REVATIO) 20 MG tablet, TAKE 1 TABLET BY MOUTH AS DIRECTED FOR ERECTILE DYSFUNCTION   Current Outpatient Medications (Analgesics):  .  allopurinol (ZYLOPRIM) 100 MG tablet, Take 2 tablets (200 mg total) by mouth daily. Marland Kitchen.  aspirin 81 MG chewable tablet, Chew 81 mg by mouth daily.    Current Outpatient Medications (Hematological):  Marland Kitchen.  Cyanocobalamin (VITAMIN B-12 PO), Take 1 capsule by mouth daily.  Current Outpatient Medications (Other):  Marland Kitchen.  Cholecalciferol (VITAMIN D3 PO), Take 4,000 Units by mouth daily.  .  diclofenac sodium (VOLTAREN) 1 % GEL, Apply 2 g topically 4 (four) times daily. .  Multiple Vitamins-Minerals (ICAPS AREDS 2 PO), Take 1 capsule by mouth 2 (two) times a day. Marland Kitchen.  MYRBETRIQ 25 MG TB24 tablet, TAKE 1 TABLET BY MOUTH ONCE DAILY .  pantoprazole (PROTONIX) 40 MG tablet, TAKE 1 TABLET BY MOUTH ONCE DAILY .  polyethylene glycol powder (GLYCOLAX/MIRALAX) powder, TAKE 17GM(DISSOLVED IN WATER) BY MOUTH TWICE A DAY AT 10 PM AND 5 PM (Patient taking differently: TAKE 17GM(DISSOLVED IN WATER) BY MOUTH ONCE DAILY) .  Pyridoxine HCl (VITAMIN B6 PO), Take 1 capsule by mouth daily. .  tizanidine (ZANAFLEX) 2 MG capsule, Take 1 capsule (2 mg total) by mouth at bedtime.    Past medical history, social, surgical and family history all reviewed in electronic medical record.  [name] pertanent information unless stated regarding to the chief complaint.   Review of Systems:  [name] headache, visual changes, nausea, vomiting, diarrhea, constipation, dizziness, abdominal pain, skin rash, fevers, chills, night sweats, weight loss, swollen lymph nodes, body aches, joint swelling, , chest pain, shortness of breath, mood changes.  Positive muscle aches  Objective  Blood pressure 122/72, pulse 62, height 5\' 9"  (1.753 m), weight 184 lb (83.5 kg), SpO2 97 %.    General: [name] apparent distress alert and oriented x3 mood and affect normal, dressed appropriately.  HEENT: Pupils equal,  extraocular movements intact  Respiratory: Patient's speak in full sentences and does not appear short of breath  Cardiovascular: [name] lower extremity edema, non tender, [name] erythema  Skin: Warm dry intact with [name] signs of infection or rash on extremities or on axial skeleton.  Abdomen: Soft nontender  Neuro: Cranial nerves II through XII are intact, neurovascularly intact in all extremities with 2+ DTRs and 2+ pulses.  Lymph:  [name] lymphadenopathy of posterior or anterior cervical chain or axillae bilaterally.  Gait normal with good balance and coordination.  MSK:  tender with mild limited range of motion and good stability and symmetric strength and tone of shoulders, elbows, wrist, hip, knee and ankles bilaterally.  Patient is back exam shows the patient does have significant bruising on inspection over the right side of his back just right of midline in the lumbar spine.  Large bruise noted.  [name] true hematoma.  Some mild tenderness over the spinous process at L1-L2 approximately 3 inches in diameter.  Negative straight leg test but significant tightness in the hamstring.  2+ deep tendon reflexes.  Good strength in lower extremities    Impression and Recommendations:     This case required medical decision making of moderate complexity. The above documentation has been reviewed and is accurate and complete Judi SaaZachary M Eevie Lapp, DO       Note: This dictation was prepared with Dragon dictation along with smaller phrase technology. Any transcriptional errors that result from this process are unintentional.

## 2018-10-27 ENCOUNTER — Other Ambulatory Visit: Payer: Self-pay

## 2018-10-27 ENCOUNTER — Encounter: Payer: Self-pay | Admitting: Family Medicine

## 2018-10-27 ENCOUNTER — Ambulatory Visit: Payer: Medicare Other | Admitting: Family Medicine

## 2018-10-27 ENCOUNTER — Ambulatory Visit (INDEPENDENT_AMBULATORY_CARE_PROVIDER_SITE_OTHER): Payer: Medicare Other | Admitting: Family Medicine

## 2018-10-27 VITALS — BP 140/82 | HR 71

## 2018-10-27 DIAGNOSIS — M7582 Other shoulder lesions, left shoulder: Secondary | ICD-10-CM | POA: Diagnosis not present

## 2018-10-27 DIAGNOSIS — S20221D Contusion of right back wall of thorax, subsequent encounter: Secondary | ICD-10-CM

## 2018-10-27 DIAGNOSIS — M25551 Pain in right hip: Secondary | ICD-10-CM | POA: Diagnosis not present

## 2018-10-27 DIAGNOSIS — M25512 Pain in left shoulder: Secondary | ICD-10-CM | POA: Diagnosis not present

## 2018-10-27 NOTE — Patient Instructions (Addendum)
Exercises 3x a week See me again in 2-3 weeks

## 2018-10-27 NOTE — Assessment & Plan Note (Signed)
More of a tendinitis with a reactive bursitis.  Patient does not have any true tear.  Discussed the possibility of injection which patient declined.  Work with Product/process development scientist to learn home exercises in greater detail.  Discussed which activities to do which will still avoid.  Patient is an avid golfer and can continue to do so but if worsening pain will consider more aggressive therapies.  Follow-up again in 2-3 weeks

## 2018-10-27 NOTE — Assessment & Plan Note (Signed)
Contusion lumbar back seems to be improving at this time.  Due to the lumbar stenosis so we will hold on any type of ambulation discussed with patient about continuing the topical anti-inflammatories.

## 2018-10-27 NOTE — Progress Notes (Signed)
Tawana ScaleZach Seren [name] D.O. [facility] [address] [place], [place] [address] Phone: [phone] Subjective:   Paul [name], Paul [name], am serving as a scribe for Dr. Antoine PrimasZachary Tayvia Faughnan.  CC: Low back pain follow-up, shoulder pain  BJY:[id number]:Subjective  Paul RoanDavid [name] is a 84 y.o. male coming in with complaint of back and hip pain. States that he has not been as active. Did try to swing today which elicited pain Pain continues over the SI joint.  Patient did have a fall 2 weeks ago.  Feels like he is making some progress.  States that it still some tight.  Worse with some rotation.  Denies any radiation down the leg.  States that the bruising seems to be improving. Since fall though is still having more shoulder pain.  Seems to be more left-sided.  Certain range of motion causes some discomfort.  Can wake him up at night.  Denies any weakness.  Denies any radiation down the arm.  No increase in neck pain     Past Medical History:  Diagnosis Date  . Cataract   . GERD (gastroesophageal reflux disease)   . Hiatal hernia   . Hypertension    Past Surgical History:  Procedure Laterality Date  . CATARACT EXTRACTION, BILATERAL    . TOTAL KNEE ARTHROPLASTY     left  . TRANSURETHRAL RESECTION OF PROSTATE  2011   Social History   Socioeconomic History  . Marital status: Legally Separated    Spouse name: Not on file  . Number of children: 0  . Years of education: Not on file  . Highest education level: Not on file  Occupational History  . 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 Financial resource strain: Not on file  . Food insecurity    Worry: Not on file    Inability: Not on file  . Transportation needs    Medical: Not on file    Non-medical: Not on file  Tobacco Use  . Smoking status: Former Smoker    Quit date: 04/28/1972    Years since quitting: 46.5  . Smokeless tobacco: Never Used  Substance and Sexual  Activity  . Alcohol use: Yes    Alcohol/week: 7.0 standard drinks    Types: 7 Standard drinks or equivalent per week    Comment: 1 glass of wine a night  . Drug use: No  . Sexual activity: Yes  Lifestyle  . Physical activity    Days per week: Not on file    Minutes per session: Not on file  . Stress: Not on file  Relationships  . Social Musicianconnections    Talks on phone: Not on file    Gets together: Not on file    Attends religious service: Not on file    Active member of club or organization: Not on file    Attends meetings of clubs or organizations: Not on file    Relationship status: Not on file  Other Topics Concern  . Not on file  Social History Narrative   Health Care POA:    Emergency Contact:    End of Life Plan:    Who lives with you: Lives by himself in 1 story home.    Any pets: none   Diet: Patient has a varied diet of protein, starch, and vegetables.  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   Exercise: Patient  golfs several times a week and does yoga at home 2x week.    Seatbelts: Patient reports wearing seat belt when in vehicle.   Paul [name] Exposure/Protection: Patient reports wearing sun screen intermittently.    Hobbies: golfing, teaching at [employer]      Allergies  Allergen Reactions  . Ace Inhibitors Swelling    After taking for long time had angioedema on lisinopril  . Lisinopril     Other reaction(s): Facial swelling   Family History  Problem Relation Age of Onset  . Diabetes Mother   . Kidney disease Father   . Cancer Sister        ? pancreatic  . Heart disease Brother   . Cancer Sister        ? pancreatic  . Colon cancer Neg Hx   . Stomach cancer Neg Hx   . Rectal cancer Neg Hx   . Esophageal cancer Neg Hx   . Liver cancer Neg Hx      Current Outpatient Medications (Cardiovascular):  .  cloNIDine (CATAPRES - DOSED IN MG/24 HR) 0.3 mg/24hr patch, APPLY 1 PATCH EVERY WEEK .  felodipine (PLENDIL) 10 MG 24 hr tablet,  TAKE 1 TABLET(10 MG) BY MOUTH DAILY .  hydrochlorothiazide (HYDRODIURIL) 25 MG tablet, Take one half tablet daily .  pravastatin (PRAVACHOL) 40 MG tablet, Take 1 tablet (40 mg total) by mouth daily. .  sildenafil (REVATIO) 20 MG tablet, TAKE 1 TABLET BY MOUTH AS DIRECTED FOR ERECTILE DYSFUNCTION   Current Outpatient Medications (Analgesics):  .  allopurinol (ZYLOPRIM) 100 MG tablet, Take 2 tablets (200 mg total) by mouth daily. Marland Kitchen  aspirin 81 MG chewable tablet, Chew 81 mg by mouth daily.    Current Outpatient Medications (Hematological):  Marland Kitchen  Cyanocobalamin (VITAMIN B-12 PO), Take 1 capsule by mouth daily.  Current Outpatient Medications (Other):  Marland Kitchen  Cholecalciferol (VITAMIN D3 PO), Take 4,000 Units by mouth daily.  .  diclofenac sodium (VOLTAREN) 1 % GEL, Apply 2 g topically 4 (four) times daily. .  Multiple Vitamins-Minerals (ICAPS AREDS 2 PO), Take 1 capsule by mouth 2 (two) times a day. Marland Kitchen  MYRBETRIQ 25 MG TB24 tablet, TAKE 1 TABLET BY MOUTH ONCE DAILY .  pantoprazole (PROTONIX) 40 MG tablet, TAKE 1 TABLET BY MOUTH ONCE DAILY .  polyethylene glycol powder (GLYCOLAX/MIRALAX) powder, TAKE 17GM(DISSOLVED IN WATER) BY MOUTH TWICE A DAY AT 10 PM AND 5 PM (Patient taking differently: TAKE 17GM(DISSOLVED IN WATER) BY MOUTH ONCE DAILY) .  Pyridoxine HCl (VITAMIN B6 PO), Take 1 capsule by mouth daily. .  tizanidine (ZANAFLEX) 2 MG capsule, Take 1 capsule (2 mg total) by mouth at bedtime.    Past medical history, social, surgical and family history all reviewed in electronic medical record.  No pertanent information unless stated regarding to the chief complaint.   Review of Systems:  No headache, visual changes, nausea, vomiting, diarrhea, constipation, dizziness, abdominal pain, skin rash, fevers, chills, night sweats, weight loss, swollen lymph nodes, body aches, joint swelling,  chest pain, shortness of breath, mood changes.  Positive muscle aches  Objective  Blood pressure 140/86, pulse  73, height 5\' 9"  (1.753 m), weight 182 lb (82.6 kg), SpO2 97 %.    General: No apparent distress alert and oriented x3 mood and affect normal, dressed appropriately.  HEENT: Pupils equal, extraocular movements intact  Respiratory: Patient's speak in full sentences and does not appear short of breath  Cardiovascular: No lower extremity edema, non tender, no erythema  Skin:  Warm dry intact with no signs of infection or rash on extremities or on axial skeleton.  Abdomen: Soft nontender  Neuro: Cranial nerves II through XII are intact, neurovascularly intact in all extremities with 2+ DTRs and 2+ pulses.  Lymph: No lymphadenopathy of posterior or anterior cervical chain or axillae bilaterally.  Gait mild antalgic MSK:  tender with moderate limited range of motion and good stability and symmetric strength and tone of  elbows, wrist, hip, knee and ankles bilaterally.  Left shoulder exam shows the patient does have positive impingement with Neer and Hawkins.  Mild positive speeds test.  Patient does have near full range of motion lacking last 5 degrees of internal rotation.  5 out of 5 strength of the rotator cuff are noted.  Back exam shows the patient does have significant loss of lordosis with a degenerative scoliosis.  Patient has bruising and contusion that was seen previously seems to be resolving.  No hematoma felt.  No midline tenderness of the lumbar spine.  Neurovascularly intact distally.   Impression and Recommendations:     This case required medical decision making of moderate complexity. The above documentation has been reviewed and is accurate and complete Judi SaaZachary M Trinh Sanjose, DO       Note: This dictation was prepared with Dragon dictation along with smaller phrase technology. Any transcriptional errors that result from this process are unintentional.

## 2018-10-28 ENCOUNTER — Ambulatory Visit: Payer: Medicare Other | Admitting: Family Medicine

## 2018-10-29 MED ORDER — DICLOFENAC SODIUM 1 % TD GEL: 2.0000 g | Freq: Four times a day (QID) | TRANSDERMAL | 1 refills | Status: AC

## 2018-10-29 NOTE — Progress Notes (Signed)
    CHIEF COMPLAINT / HPI:  1. Left shoulder pain since he fell while pressure washing the pool. Stepped on the edge and fell backward into the water, striking his lower back, left shoulder and right hip. No LOC. Shoulder is stiff but he can still move  Throughout all ROM. No numbness or tingling in his hands. No UE weakness. Had somebruising which is resolving  2. Right hip has some bruising in buttock area. No pain with walking. For several days he had pain getting up from a eated position but that is improving.  REVIEW OF SYSTEMS Pertinent review of systems: negative for fever or unusual weight change.   PERTINENT  PMH / PSH: I have reviewed the patient's medications, allergies, past medical and surgical history, smoking status and updated in the EMR as appropriate.   OBJECTIVE:  Vital signs reviewed. GENERAL: Well-developed, well-nourished, no acute distress.  LUNGS: Clear to auscultation bilaterally, no rales or wheeze. ABDOMEN: Soft positive bowel sounds NEURO: No gross focal neurological deficits. MSK: Movement of extremity x 4. SHOULDRS: B has FROM in all planes of rotator cuff. Norma strength UE.m HIPS some baseline decreased ROm but nothing new. Mildly ttp right buttock.      ASSESSMENT / PLAN:  Hip and shoulder pain from fall. Soft tissue injuries.Has already had imaging which I reviewed today as well. He is improving. Will continue the topical diclofenac gel and I will refill. F/u if not improving or new sx.  No problem-specific Assessment & Plan notes found for this encounter.

## 2018-11-04 ENCOUNTER — Other Ambulatory Visit: Payer: Self-pay

## 2018-11-04 MED ORDER — FAMOTIDINE 20 MG PO TABS: 20.0000 mg | ORAL_TABLET | Freq: Two times a day (BID) | ORAL | 3 refills | Status: DC

## 2018-11-16 ENCOUNTER — Encounter: Payer: Self-pay | Admitting: Family Medicine

## 2018-11-16 ENCOUNTER — Other Ambulatory Visit: Payer: Self-pay

## 2018-11-16 ENCOUNTER — Ambulatory Visit: Payer: Medicare Other | Admitting: Family Medicine

## 2018-11-16 ENCOUNTER — Ambulatory Visit: Payer: Self-pay

## 2018-11-16 VITALS — BP 110/78 | HR 83 | Ht 69.0 in | Wt 184.0 lb

## 2018-11-16 DIAGNOSIS — M19012 Primary osteoarthritis, left shoulder: Secondary | ICD-10-CM

## 2018-11-16 DIAGNOSIS — G8929 Other chronic pain: Secondary | ICD-10-CM

## 2018-11-16 DIAGNOSIS — M19019 Primary osteoarthritis, unspecified shoulder: Secondary | ICD-10-CM | POA: Insufficient documentation

## 2018-11-16 DIAGNOSIS — M25512 Pain in left shoulder: Secondary | ICD-10-CM | POA: Diagnosis not present

## 2018-11-16 DIAGNOSIS — M7582 Other shoulder lesions, left shoulder: Secondary | ICD-10-CM

## 2018-11-16 NOTE — Assessment & Plan Note (Signed)
Patient given injection.  Tolerated the procedure well.  Discussed icing regimen and home exercise.  Discussed which activities of doing which wants to avoid.  Patient is to increase activity slowly.  Follow-up again in 4 weeks

## 2018-11-16 NOTE — Assessment & Plan Note (Signed)
Patient given injection.  Tolerated procedure well with improvement in range of motion immediately.  Patient did have acromioclavicular arthritis monitor as well.  Discussed that if any worsening symptoms advanced imaging would be warranted but patient likely will do well with conservative therapy

## 2018-11-16 NOTE — Progress Notes (Signed)
[facility] [address], [place] [address] Phone: [phone] Subjective:   Paul [name], am serving as a scribe for Dr. Hulan Saas.  CC: Back pain follow-up  FGH:[id number]   10/27/2018: More of a tendinitis with a reactive bursitis.  Patient does not have any true tear.  Discussed the possibility of injection which patient declined.  Work with Product/process development scientist to learn home exercises in greater detail.  Discussed which activities to Paul [name] which will still avoid.  Patient is an avid golfer and can continue to Paul [name] so but if worsening pain will consider more aggressive therapies.  Follow-up again in 2-3 weeks  Contusion lumbar back seems to be improving at this time.  Due to the lumbar stenosis so we will hold on any type of ambulation discussed with patient about continuing the topical anti-inflammatories.  Update 11/16/2018: Paul [name] is a 84 y.o. male coming in with complaint of right sided back and left shoulder pain. Patient states that his backside pain has completely resolved.   Left shoulder pain still present. Pain is about the same as last visit. Pain occurs with elevation of his arm like when he reaches for steering wheel. Pain subsides when he moves his arm below shoulder height.      Past Medical History:  Diagnosis Date  . Cataract   . GERD (gastroesophageal reflux disease)   . Hiatal hernia   . Hypertension    Past Surgical History:  Procedure Laterality Date  . CATARACT EXTRACTION, BILATERAL    . TOTAL KNEE ARTHROPLASTY     left  . TRANSURETHRAL RESECTION OF PROSTATE  2011   Social History   Socioeconomic History  . Marital status: Legally Separated    Spouse name: Not on file  . Number of children: 0  . Years of education: Not on file  . Highest education level: Not on file  Occupational History  . Occupation: retired    Fish farm manager: [employer]: [place]   . Financial resource strain: Not on file  . Food insecurity    Worry: Not on file    Inability: Not on file  . Transportation needs    Medical: Not on file    Non-medical: Not on file  Tobacco Use  . Smoking status: Former Smoker    Quit date: 04/28/1972    Years since quitting: 46.5  . Smokeless tobacco: Never Used  Substance and Sexual Activity  . Alcohol use: Yes    Alcohol/week: 7.0 standard drinks    Types: 7 Standard drinks or equivalent per week    Comment: 1 glass of wine a night  . Drug use: [name]  . Sexual activity: Yes  Lifestyle  . Physical activity    Days per week: Not on file    Minutes per session: Not on file  . Stress: Not on file  Relationships  . Social Herbalist on phone: Not on file    Gets together: Not on file    Attends religious service: Not on file    Active member of club or organization: Not on file    Attends meetings of clubs or organizations: Not on file    Relationship status: Not on file  Other Topics Concern  . Not on file  Social History Narrative   Health Care POA:    Emergency Contact:    End of Life  Plan:    Who lives with you: Lives by himself in 1 story home.    Any pets: none   Diet: Patient has a varied diet of protein, starch, and vegetables.  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   Exercise: Patient golfs several times a week and does yoga at home 2x week.    Seatbelts: Patient reports wearing seat belt when in vehicle.   Wynelle LinkSun Exposure/Protection: Patient reports wearing sun screen intermittently.    Hobbies: golfing, teaching at [employer]      Allergies  Allergen Reactions  . Ace Inhibitors Swelling    After taking for long time had angioedema on lisinopril  . Lisinopril     Other reaction(s): Facial swelling   Family History  Problem Relation Age of Onset  . Diabetes Mother   . Kidney disease Father   . Cancer Sister        ? pancreatic  . Heart disease Brother   . Cancer  Sister        ? pancreatic  . Colon cancer Neg Hx   . Stomach cancer Neg Hx   . Rectal cancer Neg Hx   . Esophageal cancer Neg Hx   . Liver cancer Neg Hx      Current Outpatient Medications (Cardiovascular):  .  cloNIDine (CATAPRES - DOSED IN MG/24 HR) 0.3 mg/24hr patch, APPLY 1 PATCH EVERY WEEK .  felodipine (PLENDIL) 10 MG 24 hr tablet, TAKE 1 TABLET(10 MG) BY MOUTH DAILY .  hydrochlorothiazide (HYDRODIURIL) 25 MG tablet, Take one half tablet daily .  pravastatin (PRAVACHOL) 40 MG tablet, Take 1 tablet (40 mg total) by mouth daily. .  sildenafil (REVATIO) 20 MG tablet, TAKE 1 TABLET BY MOUTH AS DIRECTED FOR ERECTILE DYSFUNCTION   Current Outpatient Medications (Analgesics):  .  allopurinol (ZYLOPRIM) 100 MG tablet, Take 2 tablets (200 mg total) by mouth daily. Marland Kitchen.  aspirin 81 MG chewable tablet, Chew 81 mg by mouth daily.    Current Outpatient Medications (Hematological):  Marland Kitchen.  Cyanocobalamin (VITAMIN B-12 PO), Take 1 capsule by mouth daily.  Current Outpatient Medications (Other):  Marland Kitchen.  Cholecalciferol (VITAMIN D3 PO), Take 4,000 Units by mouth daily.  .  diclofenac sodium (VOLTAREN) 1 % GEL, Apply 2 g topically 4 (four) times daily. .  famotidine (PEPCID) 20 MG tablet, Take 1 tablet (20 mg total) by mouth 2 (two) times daily. .  Multiple Vitamins-Minerals (ICAPS AREDS 2 PO), Take 1 capsule by mouth 2 (two) times a day. Marland Kitchen.  MYRBETRIQ 25 MG TB24 tablet, TAKE 1 TABLET BY MOUTH ONCE DAILY .  pantoprazole (PROTONIX) 40 MG tablet, TAKE 1 TABLET BY MOUTH ONCE DAILY .  polyethylene glycol powder (GLYCOLAX/MIRALAX) powder, TAKE 17GM(DISSOLVED IN WATER) BY MOUTH TWICE A DAY AT 10 PM AND 5 PM (Patient taking differently: TAKE 17GM(DISSOLVED IN WATER) BY MOUTH ONCE DAILY) .  Pyridoxine HCl (VITAMIN B6 PO), Take 1 capsule by mouth daily. .  tizanidine (ZANAFLEX) 2 MG capsule, Take 1 capsule (2 mg total) by mouth at bedtime.    Past medical history, social, surgical and family history all  reviewed in electronic medical record.  [name] pertanent information unless stated regarding to the chief complaint.   Review of Systems:  [name] headache, visual changes, nausea, vomiting, diarrhea, constipation, dizziness, abdominal pain, skin rash, fevers, chills, night sweats, weight loss, swollen lymph nodes, body aches, joint swelling, muscle aches, chest pain, shortness of breath, mood changes.   Objective  Blood pressure 110/78,  pulse 83, height 5\' 9"  (1.753 m), weight 184 lb (83.5 kg), SpO2 99 %.    General: [name] apparent distress alert and oriented x3 mood and affect normal, dressed appropriately.  HEENT: Pupils equal, extraocular movements intact  Respiratory: Patient's speak in full sentences and does not appear short of breath  Cardiovascular: [name] lower extremity edema, non tender, [name] erythema  Skin: Warm dry intact with [name] signs of infection or rash on extremities or on axial skeleton.  Abdomen: Soft nontender  Neuro: Cranial nerves II through XII are intact, neurovascularly intact in all extremities with 2+ DTRs and 2+ pulses.  Lymph: [name] lymphadenopathy of posterior or anterior cervical chain or axillae bilaterally.  Gait normal with good balance and coordination.  MSK:  Non tender with full range of motion and good stability and symmetric strength and tone of elbows, wrist, hip, knee and ankles bilaterally.  Left shoulder exam still shows the patient does have positive impingement.  Rotator cuff strength 4 out of 5 compared to the contralateral side.  Patient also has a positive crossover with pain over the acromioclavicular joint as well.  Procedure: Real-time Ultrasound Guided Injection of left glenohumeral joint Device: GE Logiq Q7  Ultrasound guided injection is preferred based studies that show increased duration, increased effect, greater accuracy, decreased procedural pain, increased response rate with ultrasound guided versus blind injection.  Verbal informed consent obtained.   Time-out conducted.  Noted [name] overlying erythema, induration, or other signs of local infection.  Skin prepped in a sterile fashion.  Local anesthesia: Topical Ethyl chloride.  With sterile technique and under real time ultrasound guidance:  Joint visualized.  23g 1  inch needle inserted posterior approach. Pictures taken for needle placement. Patient did have injection of 2 cc of 1% lidocaine, 2 cc of 0.5% Marcaine, and 1.0 cc of Kenalog 40 mg/dL. Completed without difficulty  Pain immediately resolved suggesting accurate placement of the medication.  Advised to call if fevers/chills, erythema, induration, drainage, or persistent bleeding.  Images permanently stored and available for review in the ultrasound unit.  Impression: Technically successful ultrasound guided injection.  Procedure: Real-time Ultrasound Guided Injection of left acromioclavicular joint Device: GE Logiq Q7 Ultrasound guided injection is preferred based studies that show increased duration, increased effect, greater accuracy, decreased procedural pain, increased response rate, and decreased cost with ultrasound guided versus blind injection.  Verbal informed consent obtained.  Time-out conducted.  Noted [name] overlying erythema, induration, or other signs of local infection.  Skin prepped in a sterile fashion.  Local anesthesia: Topical Ethyl chloride.  With sterile technique and under real time ultrasound guidance: With a 25-gauge 1 inch needle injected with 0.5 cc of 0.5% Marcaine and 0.5 cc of Kenalog 40 mg/mL Completed without difficulty  Pain immediately resolved suggesting accurate placement of the medication.  Advised to call if fevers/chills, erythema, induration, drainage, or persistent bleeding.  Images permanently stored and available for review in the ultrasound unit.  Impression: Technically successful ultrasound guided injection.     Impression and Recommendations:     This case required medical  decision making of moderate complexity. The above documentation has been reviewed and is accurate and complete Paul SaaZachary M Anshi Jalloh, Paul [name]       Note: This dictation was prepared with Dragon dictation along with smaller phrase technology. Any transcriptional errors that result from this process are unintentional.

## 2018-11-16 NOTE — Patient Instructions (Addendum)
Injected shoulder See me again in 4-5 weeks

## 2018-12-21 ENCOUNTER — Encounter: Payer: Self-pay | Admitting: Family Medicine

## 2018-12-21 ENCOUNTER — Other Ambulatory Visit: Payer: Self-pay

## 2018-12-21 ENCOUNTER — Ambulatory Visit: Payer: Medicare Other | Admitting: Family Medicine

## 2018-12-21 ENCOUNTER — Ambulatory Visit: Payer: Self-pay

## 2018-12-21 ENCOUNTER — Other Ambulatory Visit: Payer: Medicare Other

## 2018-12-21 VITALS — BP 132/90 | HR 65 | Ht 69.0 in | Wt 183.0 lb

## 2018-12-21 DIAGNOSIS — M7062 Trochanteric bursitis, left hip: Secondary | ICD-10-CM | POA: Diagnosis not present

## 2018-12-21 DIAGNOSIS — M48061 Spinal stenosis, lumbar region without neurogenic claudication: Secondary | ICD-10-CM | POA: Diagnosis not present

## 2018-12-21 DIAGNOSIS — M25552 Pain in left hip: Secondary | ICD-10-CM | POA: Diagnosis not present

## 2018-12-21 DIAGNOSIS — G8929 Other chronic pain: Secondary | ICD-10-CM

## 2018-12-21 DIAGNOSIS — M9904 Segmental and somatic dysfunction of sacral region: Secondary | ICD-10-CM

## 2018-12-21 DIAGNOSIS — M9902 Segmental and somatic dysfunction of thoracic region: Secondary | ICD-10-CM | POA: Diagnosis not present

## 2018-12-21 DIAGNOSIS — M5416 Radiculopathy, lumbar region: Secondary | ICD-10-CM | POA: Diagnosis not present

## 2018-12-21 DIAGNOSIS — M9903 Segmental and somatic dysfunction of lumbar region: Secondary | ICD-10-CM | POA: Diagnosis not present

## 2018-12-21 DIAGNOSIS — M999 Biomechanical lesion, unspecified: Secondary | ICD-10-CM | POA: Diagnosis not present

## 2018-12-21 MED ORDER — ALLOPURINOL 100 MG PO TABS: 200.0000 mg | ORAL_TABLET | Freq: Every day | ORAL | 3 refills | Status: DC

## 2018-12-21 NOTE — Assessment & Plan Note (Addendum)
Patient given injection today and tolerated the procedure well.  We discussed icing regimen and home exercise, discussed which activities of doing which wants to avoid.  Patient is to increase activity as tolerated.  Aspiration was done and sent to the lab to rule out infectious etiology but likely gout.  Follow-up again in 4 to 8 weeks

## 2018-12-21 NOTE — Progress Notes (Signed)
Tawana ScaleZach  D.O. [facility] [address] [place], [place] [address] Phone: [phone] Subjective:   I Paul NighKana [name] am serving as a Neurosurgeonscribe for Dr. Antoine PrimasZachary .  I'm seeing this patient by the request  of:    CC: Left hip, back pain follow-up  BJY:[id number]:Subjective   11/16/2018 Patient given injection.  Tolerated the procedure well.  Discussed icing regimen and home exercise.  Discussed which activities of doing which wants to avoid.  Patient is to increase activity slowly.  Follow-up again in 4 weeks  Patient given injection.  Tolerated procedure well with improvement in range of motion immediately.  Patient did have acromioclavicular arthritis monitor as well.  Discussed that if any worsening symptoms advanced imaging would be warranted but patient likely will do well with conservative therapy  12/21/2018 Paul RoanDavid [name] is a 84 y.o. male coming in with complaint of left shoulder and left hip pain. Hip is awful today. Stairs is painful. Walking with a limp. Shoulder is improving. Sore today but not as bad as before.  Patient states that the shoulder seems to be making improvement.  Known lumbar stenosis.  Back has been doing relatively well but some mild tightness.  Feels that he is compensating for the hip on the left side.  States is unable to stand on one leg secondary to the discomfort.  Is able to walk but is not able to go up or down stairs.    Past Medical History:  Diagnosis Date  . Cataract   . GERD (gastroesophageal reflux disease)   . Hiatal hernia   . Hypertension    Past Surgical History:  Procedure Laterality Date  . CATARACT EXTRACTION, BILATERAL    . TOTAL KNEE ARTHROPLASTY     left  . TRANSURETHRAL RESECTION OF PROSTATE  2011   Social History   Socioeconomic History  . Marital status: Legally Separated    Spouse name: Not on file  . Number of children: 0  . Years of education: Not on file  . Highest education level: Not on file  Occupational History   . 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 Financial resource strain: Not on file  . Food insecurity    Worry: Not on file    Inability: Not on file  . Transportation needs    Medical: Not on file    Non-medical: Not on file  Tobacco Use  . Smoking status: Former Smoker    Quit date: 04/28/1972    Years since quitting: 46.6  . Smokeless tobacco: Never Used  Substance and Sexual Activity  . Alcohol use: Yes    Alcohol/week: 7.0 standard drinks    Types: 7 Standard drinks or equivalent per week    Comment: 1 glass of wine a night  . Drug use: No  . Sexual activity: Yes  Lifestyle  . Physical activity    Days per week: Not on file    Minutes per session: Not on file  . Stress: Not on file  Relationships  . Social Musicianconnections    Talks on phone: Not on file    Gets together: Not on file    Attends religious service: Not on file    Active member of club or organization: Not on file    Attends meetings of clubs or organizations: Not on file    Relationship status: Not on file  Other Topics  Concern  . Not on file  Social History Narrative   Health Care POA:    Emergency Contact:    End of Life Plan:    Who lives with you: Lives by himself in 1 story home.    Any pets: none   Diet: Patient has a varied diet of protein, starch, and vegetables.  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   Exercise: Patient golfs several times a week and does yoga at home 2x week.    Seatbelts: Patient reports wearing seat belt when in vehicle.   [name] Exposure/Protection: Patient reports wearing sun screen intermittently.    Hobbies: golfing, teaching at [employer]      Allergies  Allergen Reactions  . Ace Inhibitors Swelling    After taking for long time had angioedema on lisinopril  . Lisinopril     Other reaction(s): Facial swelling   Family History  Problem Relation Age of Onset   . Diabetes Mother   . Kidney disease Father   . Cancer Sister        ? pancreatic  . Heart disease Brother   . Cancer Sister        ? pancreatic  . Colon cancer Neg Hx   . Stomach cancer Neg Hx   . Rectal cancer Neg Hx   . Esophageal cancer Neg Hx   . Liver cancer Neg Hx      Current Outpatient Medications (Cardiovascular):  .  cloNIDine (CATAPRES - DOSED IN MG/24 HR) 0.3 mg/24hr patch, APPLY 1 PATCH EVERY WEEK .  felodipine (PLENDIL) 10 MG 24 hr tablet, TAKE 1 TABLET(10 MG) BY MOUTH DAILY .  hydrochlorothiazide (HYDRODIURIL) 25 MG tablet, Take one half tablet daily .  pravastatin (PRAVACHOL) 40 MG tablet, Take 1 tablet (40 mg total) by mouth daily. .  sildenafil (REVATIO) 20 MG tablet, TAKE 1 TABLET BY MOUTH AS DIRECTED FOR ERECTILE DYSFUNCTION   Current Outpatient Medications (Analgesics):  .  aspirin 81 MG chewable tablet, Chew 81 mg by mouth daily.   Marland Kitchen  allopurinol (ZYLOPRIM) 100 MG tablet, Take 2 tablets (200 mg total) by mouth daily.  Current Outpatient Medications (Hematological):  Marland Kitchen  Cyanocobalamin (VITAMIN B-12 PO), Take 1 capsule by mouth daily.  Current Outpatient Medications (Other):  Marland Kitchen  Cholecalciferol (VITAMIN D3 PO), Take 4,000 Units by mouth daily.  .  diclofenac sodium (VOLTAREN) 1 % GEL, Apply 2 g topically 4 (four) times daily. .  famotidine (PEPCID) 20 MG tablet, Take 1 tablet (20 mg total) by mouth 2 (two) times daily. .  Multiple Vitamins-Minerals (ICAPS AREDS 2 PO), Take 1 capsule by mouth 2 (two) times a day. Marland Kitchen  MYRBETRIQ 25 MG TB24 tablet, TAKE 1 TABLET BY MOUTH ONCE DAILY .  pantoprazole (PROTONIX) 40 MG tablet, TAKE 1 TABLET BY MOUTH ONCE DAILY .  polyethylene glycol powder (GLYCOLAX/MIRALAX) powder, TAKE 17GM(DISSOLVED IN WATER) BY MOUTH TWICE A DAY AT 10 PM AND 5 PM (Patient taking differently: TAKE 17GM(DISSOLVED IN WATER) BY MOUTH ONCE DAILY) .  Pyridoxine HCl (VITAMIN B6 PO), Take 1 capsule by mouth daily. .  tizanidine (ZANAFLEX) 2 MG capsule,  Take 1 capsule (2 mg total) by mouth at bedtime.    Past medical history, social, surgical and family history all reviewed in electronic medical record.  No pertanent information unless stated regarding to the chief complaint.   Review of Systems:  No headache, visual changes, nausea, vomiting, diarrhea, constipation, dizziness, abdominal pain, skin rash, fevers, chills, night  sweats, weight loss, swollen lymph nodes, body aches, joint swelling,chest pain, shortness of breath, mood changes.  Positive muscle aches  Objective  Blood pressure 132/90, pulse 65, height 5\' 9"  (1.753 m), weight 183 lb (83 kg), SpO2 98 %.   General: No apparent distress alert and oriented x3 mood and affect normal, dressed appropriately.  HEENT: Pupils equal, extraocular movements intact  Respiratory: Patient's speak in full sentences and does not appear short of breath  Cardiovascular: No lower extremity edema, non tender, no erythema  Skin: Warm dry intact with no signs of infection or rash on extremities or on axial skeleton.  Abdomen: Soft nontender  Neuro: Cranial nerves II through XII are intact, neurovascularly intact in all extremities with 2+ DTRs and 2+ pulses.  Lymph: No lymphadenopathy of posterior or anterior cervical chain or axillae bilaterally.  Gait severely antalgic MSK:  tender with mild limited range of motion and good stability and symmetric strength and tone of shoulders, elbows, wrist,  knee and ankles bilaterally.  Changes of multiple joints Left hip does have some swelling on the lateral aspect of the hip compared to the contralateral side.  Mild warm to touch.  Patient has a negative straight leg test but still tightness of the hamstrings bilaterally.  Patient's low back does have some degenerative scoliosis as well as loss of lordosis.  Mild tenderness over the left sacroiliac joint more than the right side which is different.  Limited musculoskeletal ultrasound was performed and  interpreted by Lyndal Pulley  Limited ultrasound shows the patient does have a severe greater trochanteric bursitis noted of the lateral hip.  Possible gouty deposits in it.  No sign of any soft tissue infection noted.   Procedure: Real-time Ultrasound Guided Injection of left  greater trochanteric bursitis secondary to patient's body habitus Device: GE Logiq Q7  Ultrasound guided injection is preferred based studies that show increased duration, increased effect, greater accuracy, decreased procedural pain, increased response rate, and decreased cost with ultrasound guided versus blind injection.  Verbal informed consent obtained.  Time-out conducted.  Noted no overlying erythema, induration, or other signs of local infection.  Skin prepped in a sterile fashion.  Local anesthesia: Topical Ethyl chloride.  With sterile technique and under real time ultrasound guidance:  Greater trochanteric area was visualized and patient's bursa was noted. A 22-gauge 3 inch needle was inserted and 4 cc of 0.5% Marcaine and aspirated 10 cc of cloudy fluid then injected 1 cc of Kenalog 40 mg/dL was injected. Pictures taken Completed without difficulty  Pain immediately resolved suggesting accurate placement of the medication.  Advised to call if fevers/chills, erythema, induration, drainage, or persistent bleeding.  Images permanently stored and available for review in the ultrasound unit.  Impression: Technically successful ultrasound guided injection.  Osteopathic findings  T7 extended rotated and side bent left L2 flexed rotated and side bent right Sacrum left on left     Impression and Recommendations:     This case required medical decision making of moderate complexity. The above documentation has been reviewed and is accurate and complete Lyndal Pulley, DO       Note: This dictation was prepared with Dragon dictation along with smaller phrase technology. Any transcriptional errors that  result from this process are unintentional.

## 2018-12-21 NOTE — Assessment & Plan Note (Signed)
Decision today to treat with OMT was based on Physical Exam  After verbal consent patient was treated with HVLA, ME, FPR techniques in  thoracic, lumbar and sacral areas  Patient tolerated the procedure well with improvement in symptoms  Patient given exercises, stretches and lifestyle modifications  See medications in patient instructions if given  Patient will follow up in 4-6 weeks 

## 2018-12-21 NOTE — Patient Instructions (Signed)
Follow up in 4-6 weeks

## 2018-12-21 NOTE — Assessment & Plan Note (Signed)
Stable overall.  Responding well to manipulation, will continue the tai chi and the home exercises, follow-up again in 4 weeks

## 2018-12-22 LAB — SYNOVIAL CELL COUNT + DIFF, W/ CRYSTALS
Basophils, %: 0 %
Eosinophils-Synovial: 0 % (ref 0–2)
Lymphocytes-Synovial Fld: 93 % — ABNORMAL HIGH (ref 0–74)
Monocyte/Macrophage: 0 % (ref 0–69)
Neutrophil, Synovial: 7 % (ref 0–24)
Synoviocytes, %: 0 % (ref 0–15)
WBC, Synovial: 156 cells/uL — ABNORMAL HIGH (ref <150)

## 2018-12-22 LAB — TIQ-NTM

## 2018-12-29 ENCOUNTER — Encounter: Payer: Self-pay | Admitting: Family Medicine

## 2019-01-20 ENCOUNTER — Other Ambulatory Visit: Payer: Self-pay

## 2019-01-20 ENCOUNTER — Ambulatory Visit (INDEPENDENT_AMBULATORY_CARE_PROVIDER_SITE_OTHER): Payer: Medicare Other | Admitting: Sports Medicine

## 2019-01-20 ENCOUNTER — Encounter: Payer: Self-pay | Admitting: Sports Medicine

## 2019-01-20 ENCOUNTER — Other Ambulatory Visit: Payer: Self-pay | Admitting: Internal Medicine

## 2019-01-20 VITALS — BP 140/78 | Ht 69.0 in | Wt 186.0 lb

## 2019-01-20 DIAGNOSIS — M7062 Trochanteric bursitis, left hip: Secondary | ICD-10-CM

## 2019-01-20 DIAGNOSIS — K2100 Gastro-esophageal reflux disease with esophagitis, without bleeding: Secondary | ICD-10-CM

## 2019-01-20 MED ORDER — PREDNISONE 10 MG PO TABS: ORAL_TABLET | ORAL | 0 refills | Status: DC

## 2019-01-20 NOTE — Progress Notes (Signed)
  Keiji Melland - 84 y.o. male MRN [id number]  Date of birth: 01/10/1935  SUBJECTIVE:   CC: left hip pain  Mr. Grivas presents for chronic right hip pain since a fall 6 months ago. He has had x-rays that appear normal. He saw Dr. Tamala Julian on 8/25 and had evidence of greater trochanteric bursitis on [place], had hip injected with good relief for 3 weeks. This past week, he played golf 6 days in a row. Lateral hip started bothering him beginning of this week to the point that he has been limping. He started using a cane yesterday with walking. He played 18 holes of golf yesterday. No pain during his golf swing. Painful to lie on left side in bed. He has noted swelling on left side. No numbness, tingling, or weakness. Has not taken any medications for pain. He has been doing Tai Chi exercises.    ROS: No unexpected weight loss, fever, chills, swelling, instability, muscle pain, numbness/tingling, redness, otherwise see HPI   PMHx - Updated and reviewed.  Contributory factors include: Negative PSHx - Updated and reviewed.  Contributory factors include:  Negative FHx - Updated and reviewed.  Contributory factors include:  Negative Social Hx - Updated and reviewed. Contributory factors include: Negative Medications - reviewed   DATA REVIEWED: Prior records   PHYSICAL EXAM:  VS: BP:140/78  HR: bpm  TEMP: ( )  RESP:   HT:'5\' 9"'$  (175.3 cm)   WT:186 lb (84.4 kg)  BMI:27.45 PHYSICAL EXAM: Gen: NAD, alert, cooperative with exam, well-appearing HEENT: clear conjunctiva,  CV:  no edema, capillary refill brisk, normal rate Resp: non-labored Skin: no rashes, normal turgor  Neuro: no gross deficits.  Psych:  alert and oriented   Left hip:  - Inspection: Mild swelling, no erythema, or ecchymosis - Palpation: TTP over greater trochanter - ROM: Normal range of motion on Flexion, extension, abduction, internal and external rotation - Strength: Normal strength. Pain with resisted abduction - Neuro/vasc: NV  intact distally - Special Tests: Negative FABER and FADIR.  Negative Scour.  Pain with Trendelenberg. Negative log roll.   Right hip: - Inspection: No swelling, no erythema, or ecchymosis - Palpation: no TTP - ROM: Normal range of motion on Flexion, extension, abduction, internal and external rotation - Strength: Normal strength.  - Neuro/vasc: NV intact distally - Special Tests: Negative FABER and FADIR.  ASSESSMENT & PLAN:  Left hip pain- appears to be consistent with greater hip trochanteric bursitis as it improved for ~3 weeks with steroid injection of greater trochanter on 8/25. He likely exacerbated bursitis by playing golf 6 days in a row last week. Will give prednisone dose pack and refer for physical therapy. If no improvement in 2 weeks, can repeat greater trochanter injection.  Patient seen and evaluated with the sports medicine fellow.  I agree with the above plan of care.

## 2019-01-21 ENCOUNTER — Ambulatory Visit (INDEPENDENT_AMBULATORY_CARE_PROVIDER_SITE_OTHER): Payer: Medicare Other | Admitting: Family Medicine

## 2019-01-21 ENCOUNTER — Encounter: Payer: Self-pay | Admitting: Family Medicine

## 2019-01-21 VITALS — BP 158/98 | HR 67 | Ht 69.0 in | Wt 191.0 lb

## 2019-01-21 DIAGNOSIS — M999 Biomechanical lesion, unspecified: Secondary | ICD-10-CM | POA: Diagnosis not present

## 2019-01-21 DIAGNOSIS — M9904 Segmental and somatic dysfunction of sacral region: Secondary | ICD-10-CM

## 2019-01-21 DIAGNOSIS — M9902 Segmental and somatic dysfunction of thoracic region: Secondary | ICD-10-CM | POA: Diagnosis not present

## 2019-01-21 DIAGNOSIS — M7062 Trochanteric bursitis, left hip: Secondary | ICD-10-CM

## 2019-01-21 DIAGNOSIS — Z23 Encounter for immunization: Secondary | ICD-10-CM

## 2019-01-21 DIAGNOSIS — M9903 Segmental and somatic dysfunction of lumbar region: Secondary | ICD-10-CM

## 2019-01-21 DIAGNOSIS — M48061 Spinal stenosis, lumbar region without neurogenic claudication: Secondary | ICD-10-CM

## 2019-01-21 DIAGNOSIS — M5416 Radiculopathy, lumbar region: Secondary | ICD-10-CM | POA: Diagnosis not present

## 2019-01-21 NOTE — Progress Notes (Signed)
[name] [place], [place] [address] Phone: [phone] Subjective:   Paul [name], am serving as a scribe for Dr. Hulan Saas.  I'm seeing this patient by the request  of:    CC: Left hip and back pain  [id number]  Paul [name] is a 84 y.o. male coming in with complaint of left hip and back pain. States that his pain has increased. Is using a cane today. Gait has changed due to pain. Patient feels that he over did physical activity. Pain over lateral hip, glute and into the quad. Pain with walking. Is using prednisone.  Patient has been putting significant amount of golf.  Saw another provider and is on prednisone.  Feels like he is making some improvement but walking with the aid of a cane.  States that he is some tightness in his legs that usually goes with his spinal stenosis but maybe not quite as bad as it is been in the past.  Patient states some pain with laying on that hip at night as well.       Past Medical History:  Diagnosis Date  . Cataract   . GERD (gastroesophageal reflux disease)   . Hiatal hernia   . Hypertension    Past Surgical History:  Procedure Laterality Date  . CATARACT EXTRACTION, BILATERAL    . TOTAL KNEE ARTHROPLASTY     left  . TRANSURETHRAL RESECTION OF PROSTATE  2011   Social History   Socioeconomic History  . Marital status: Legally Separated    Spouse name: Not on file  . Number of children: 0  . Years of education: Not on file  . Highest education level: Not on file  Occupational History  . Occupation: retired    Fish farm manager: [employer]: [place]  . Financial resource strain: Not on file  . Food insecurity    Worry: Not on file    Inability: Not on file  . Transportation needs    Medical: Not on file    Non-medical: Not on file  Tobacco Use  . Smoking status: Former Smoker    Quit date: 04/28/1972    Years since  quitting: 46.7  . Smokeless tobacco: Never Used  Substance and Sexual Activity  . Alcohol use: Yes    Alcohol/week: 7.0 standard drinks    Types: 7 Standard drinks or equivalent per week    Comment: 1 glass of wine a night  . Drug use: [name]  . Sexual activity: Yes  Lifestyle  . Physical activity    Days per week: Not on file    Minutes per session: Not on file  . Stress: Not on file  Relationships  . Social Herbalist on phone: Not on file    Gets together: Not on file    Attends religious service: Not on file    Active member of club or organization: Not on file    Attends meetings of clubs or organizations: Not on file    Relationship status: Not on file  Other Topics Concern  . Not on file  Social History Narrative   Health Care POA:    Emergency Contact:    End of Life Plan:    Who lives with you: Lives by himself in 1 story home.    Any pets: none   Diet: Patient has a varied diet of protein,  starch, and vegetables.  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   Exercise: Patient golfs several times a week and does yoga at home 2x week.    Seatbelts: Patient reports wearing seat belt when in vehicle.   [name] Exposure/Protection: Patient reports wearing sun screen intermittently.    Hobbies: golfing, teaching at [employer]      Allergies  Allergen Reactions  . Ace Inhibitors Swelling    After taking for long time had angioedema on lisinopril  . Lisinopril     Other reaction(s): Facial swelling   Family History  Problem Relation Age of Onset  . Diabetes Mother   . Kidney disease Father   . Cancer Sister        ? pancreatic  . Heart disease Brother   . Cancer Sister        ? pancreatic  . Colon cancer Neg Hx   . Stomach cancer Neg Hx   . Rectal cancer Neg Hx   . Esophageal cancer Neg Hx   . Liver cancer Neg Hx     Current Outpatient Medications (Endocrine & Metabolic):  .  predniSONE (DELTASONE) 10 MG tablet, Use as directed  per doctors orders.  Current Outpatient Medications (Cardiovascular):  .  cloNIDine (CATAPRES - DOSED IN MG/24 HR) 0.3 mg/24hr patch, APPLY 1 PATCH EVERY WEEK .  felodipine (PLENDIL) 10 MG 24 hr tablet, TAKE 1 TABLET(10 MG) BY MOUTH DAILY .  hydrochlorothiazide (HYDRODIURIL) 25 MG tablet, Take one half tablet daily .  pravastatin (PRAVACHOL) 40 MG tablet, Take 1 tablet (40 mg total) by mouth daily. .  sildenafil (REVATIO) 20 MG tablet, TAKE 1 TABLET BY MOUTH AS DIRECTED FOR ERECTILE DYSFUNCTION   Current Outpatient Medications (Analgesics):  .  allopurinol (ZYLOPRIM) 100 MG tablet, Take 2 tablets (200 mg total) by mouth daily. Marland Kitchen  aspirin 81 MG chewable tablet, Chew 81 mg by mouth daily.    Current Outpatient Medications (Hematological):  Marland Kitchen  Cyanocobalamin (VITAMIN B-12 PO), Take 1 capsule by mouth daily.  Current Outpatient Medications (Other):  Marland Kitchen  Cholecalciferol (VITAMIN D3 PO), Take 4,000 Units by mouth daily.  .  diclofenac sodium (VOLTAREN) 1 % GEL, Apply 2 g topically 4 (four) times daily. .  famotidine (PEPCID) 20 MG tablet, Take 1 tablet (20 mg total) by mouth 2 (two) times daily. .  Multiple Vitamins-Minerals (ICAPS AREDS 2 PO), Take 1 capsule by mouth 2 (two) times a day. Marland Kitchen  MYRBETRIQ 25 MG TB24 tablet, TAKE 1 TABLET BY MOUTH ONCE DAILY .  pantoprazole (PROTONIX) 40 MG tablet, TAKE 1 TABLET BY MOUTH ONCE DAILY .  polyethylene glycol powder (GLYCOLAX/MIRALAX) powder, TAKE 17GM(DISSOLVED IN WATER) BY MOUTH TWICE A DAY AT 10 PM AND 5 PM (Patient taking differently: TAKE 17GM(DISSOLVED IN WATER) BY MOUTH ONCE DAILY) .  Pyridoxine HCl (VITAMIN B6 PO), Take 1 capsule by mouth daily. .  tizanidine (ZANAFLEX) 2 MG capsule, Take 1 capsule (2 mg total) by mouth at bedtime.    Past medical history, social, surgical and family history all reviewed in electronic medical record.  [name] pertanent information unless stated regarding to the chief complaint.   Review of Systems:  [name] headache,  visual changes, nausea, vomiting, diarrhea, constipation, dizziness, abdominal pain, skin rash, fevers, chills, night sweats, weight loss, swollen lymph nodes, body aches, joint swelling, chest pain, shortness of breath, mood changes.  Positive muscle aches  Objective  Blood pressure (!) 158/98, pulse 67, height 5\' 9"  (1.753 m), weight 191  lb (86.6 kg), SpO2 99 %.    General: [name] apparent distress alert and oriented x3 mood and affect normal, dressed appropriately.  HEENT: Pupils equal, extraocular movements intact  Respiratory: Patient's speak in full sentences and does not appear short of breath  Cardiovascular: [name] lower extremity edema, non tender, [name] erythema  Skin: Warm dry intact with [name] signs of infection or rash on extremities or on axial skeleton.  Abdomen: Soft nontender  Neuro: Cranial nerves II through XII are intact, neurovascularly intact in all extremities with 2+ DTRs and 2+ pulses.  Lymph: [name] lymphadenopathy of posterior or anterior cervical chain or axillae bilaterally.  Gait antalgic favoring the left hip MSK:  tender with limited range of motion and good stability and symmetric strength and tone of shoulders, elbows, wrist, and ankles bilaterally.  Mild to moderate arthritic changes of multiple joints Left hip exam shows severe tenderness over the greater trochanteric area in the gluteal muscle.  Patient has 4 out of 5 strength noted with hip abductor.  Patient though does have full flexion.  Mild limited internal rotation of the hip with mild symmetric to the contralateral side.  Severe tightness with straight leg test bilaterally.  Back exam has severe loss of lordosis with degenerative scoliosis.  Tightness with the straight leg test but [name] radicular symptoms.  Tender to palpation of the paraspinal musculature of the lumbar spine right greater than left  Osteopathic findings  T9 extended rotated and side bent left L2 flexed rotated and side bent right Sacrum right on  right      Impression and Recommendations:     This case required medical decision making of moderate complexity. The above documentation has been reviewed and is accurate and complete Judi SaaZachary M , DO       Note: This dictation was prepared with Dragon dictation along with smaller phrase technology. Any transcriptional errors that result from this process are unintentional.

## 2019-01-21 NOTE — Patient Instructions (Signed)
See me in 4-5 weeks Message me on Monday

## 2019-01-22 ENCOUNTER — Encounter: Payer: Self-pay | Admitting: Family Medicine

## 2019-01-22 NOTE — Assessment & Plan Note (Signed)
Appears to be worsening again.  Did have to have aspiration done previously.  If there is accumulation again we can consider the possibility.  May need advanced imaging.  Patient wants to continue with conservative therapy at this time and will see patient back again in 2 weeks

## 2019-01-22 NOTE — Assessment & Plan Note (Signed)
Tender to palpation.  Discussed that spinal stenosis.  Discussed which activities to do which wants to avoid.  Patient should increase activity as tolerated.  We did discuss the possibility of another epidural which patient declined at the moment.  Responded fairly well to osteopathic manipulation.  Continue same medications.  Follow-up again 2 to 4 weeks

## 2019-01-22 NOTE — Assessment & Plan Note (Signed)
Decision today to treat with OMT was based on Physical Exam  After verbal consent patient was treated with HVLA, ME, FPR techniques in  thoracic, lumbar and sacral areas  Patient tolerated the procedure well with improvement in symptoms  Patient given exercises, stretches and lifestyle modifications  See medications in patient instructions if given  Patient will follow up in 4-8 weeks 

## 2019-01-24 ENCOUNTER — Encounter: Payer: Self-pay | Admitting: Family Medicine

## 2019-01-28 ENCOUNTER — Telehealth: Payer: Self-pay | Admitting: *Deleted

## 2019-01-28 DIAGNOSIS — M7062 Trochanteric bursitis, left hip: Secondary | ICD-10-CM

## 2019-01-28 NOTE — Telephone Encounter (Signed)
Pt left msg requesting PT referral for L hip.

## 2019-02-01 ENCOUNTER — Other Ambulatory Visit: Payer: Self-pay

## 2019-02-01 ENCOUNTER — Encounter: Payer: Self-pay | Admitting: Physical Therapy

## 2019-02-01 ENCOUNTER — Ambulatory Visit: Payer: Medicare Other | Attending: Family Medicine | Admitting: Physical Therapy

## 2019-02-01 DIAGNOSIS — M545 Low back pain, unspecified: Secondary | ICD-10-CM

## 2019-02-01 DIAGNOSIS — M25552 Pain in left hip: Secondary | ICD-10-CM

## 2019-02-01 DIAGNOSIS — M25652 Stiffness of left hip, not elsewhere classified: Secondary | ICD-10-CM | POA: Diagnosis present

## 2019-02-02 NOTE — Therapy (Signed)
[place], [place], [address] Phone: [phone]   Fax:  [phone]  Physical Therapy Evaluation  Patient Details  Name: Paul [name] MRN: [id number] Date of Birth: May 22, 1934 Referring Provider (PT): Dr. Hulan Saas    Encounter Date: 02/01/2019  PT End of Session - 02/01/19 1439    Visit Number  1    Number of Visits  12    Date for PT Re-Evaluation  03/18/19    PT Start Time  7124    PT Stop Time  1530    PT Time Calculation (min)  45 min    Activity Tolerance  Patient tolerated treatment well    Behavior During Therapy  [facility] for tasks assessed/performed       Past Medical History:  Diagnosis Date  . Cataract   . GERD (gastroesophageal reflux disease)   . Hiatal hernia   . Hypertension     Past Surgical History:  Procedure Laterality Date  . CATARACT EXTRACTION, BILATERAL    . TOTAL KNEE ARTHROPLASTY     left  . TRANSURETHRAL RESECTION OF PROSTATE  2011    There were no vitals filed for this visit.   Subjective Assessment - 02/01/19 1442    Subjective  Patient has chronic L hip pain (5 mos ago), he reports falling on L hip at that time.  He which he has been getting treatment for .  Dr. Micheline Chapman . Injection min results. He has difficulty exercising (does TaiChi, daily routine) and walking.  Feels weaker with these activities. Did have to use walker briefly. He has modified his methods for walking, stairs.  Painful with lying on L side.    Pertinent History  spinal stenosis and spondylosis, HTN, GERD    Limitations  Standing;Walking;House hold activities;Lifting    How long can you walk comfortably?  about a mile, walks the dog, about 3/4 mile in has pain, 50-60 min mowed    Diagnostic tests  XR only: Severe lumbar spondylosis    Currently in Pain?  Yes   none at rest   Pain Score  4     Pain Location  Hip    Pain Orientation  Left;Lateral;Posterior    Pain Descriptors / Indicators   Sore;Discomfort;Aching    Pain Type  Chronic pain    Pain Radiating Towards  posterior hip, occ buttocks, both legs ache at times due to stenosis (lower leg)    Pain Onset  More than a month ago    Aggravating Factors   standing, walking, lying on L side    Pain Relieving Factors  rest    Effect of Pain on Daily Activities  limits sleep and general exercise    Multiple Pain Sites  No         [facility] PT Assessment - 02/02/19 0001      Assessment   Medical Diagnosis  L hip Gr Troch. bursitis    Referring Provider (PT)  Dr. Hulan Saas     Onset Date/Surgical Date  --   5 mos ago    Prior Therapy  Yes not for this       Precautions   Precautions  None      Restrictions   Weight Bearing Restrictions  No      Balance Screen   Has the patient fallen in the past 6 months  Yes    How many times?  1    Has the patient had a decrease  in activity level because of a fear of falling?   No    Is the patient reluctant to leave their home because of a fear of falling?   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     Leisure  exercise , TaiChi, golf      Cognition   Overall Cognitive Status  Within Functional Limits for tasks assessed      Sensation   Light Touch  Appears Intact    Additional Comments  history of neuropathy/in toes       Functional Tests   Functional tests  Squat;Step up;Single leg stance;Sit to Stand      Squat   Comments  good form , no pain       Step Up   Comments  pain, weak on LLE (6 inch)       Single Leg Stance   Comments  limited on LLE < 5 sec , 10 sec on Rt       Sit to Stand   Comments  not tested        PROM   Overall PROM Comments  L hip with min pain on ER, about 50 deg       Strength   Overall Strength Comments  knees 5/5     Right Hip Flexion  4+/5    Right Hip ABduction  4-/5    Left Hip Flexion  4+/5    Left Hip ABduction  3/5    Right Knee Flexion  5/5    Right Knee Extension  5/5    Left Knee Flexion  5/5    Left Knee Extension  5/5      Flexibility   Soft Tissue Assessment /Muscle Length  yes    Hamstrings  tight    Piriformis  tight      Palpation   Spinal mobility  did not test in prone     Palpation comment  pain along L Gr. Trochanter and tenderness extending L ITB to knee. Sore in low lumbar paraspinals on L side.       Special Tests   Other special tests  neg Ober       Transfers   Transfers  Sit to Stand;Supine to Sit    Sit to Stand  7: Independent    Supine to Sit  6: Modified independent (Device/Increase time)         Objective measurements completed on examination: See above findings.      PT Education - 02/01/19 1438    Education Details  PT/POC, HEP , RICE    Person(s) Educated  Patient    Methods  Explanation;Demonstration;Handout    Comprehension  Verbalized understanding;Returned demonstration          PT Long Term Goals - 02/02/19 0720      PT LONG TERM GOAL #1   Title  Pt will be I with HEP for hip and trunk flexibility, core strength    Time  6    Period  Weeks    Status  New    Target Date  03/18/19      PT LONG TERM GOAL #2   Title  Pt will improve FOTO score to less than 35% limited to show functional improvement    Time  6    Period  Weeks    Status  New    Target Date  03/18/19      PT LONG TERM GOAL #3   Title  Pt will be able to step up with LLE, bear full weight on LLE without pain for mobility in home and performing Tai chi.    Time  6    Period  Weeks    Status  New    Target Date  03/18/19      PT LONG TERM GOAL #4   Title  Pt will be able to lie on L side at night for a portion of the time with  pain no more than minimal.    Time  6    Period  Weeks    Status  New    Target Date  03/18/19      PT LONG TERM GOAL #5   Title  Pt will demo lateral hip strength to 4+/5 bilaterally to maximize stability in standing    Time  6    Period  Weeks    Status  New    Target Date  03/18/19             Plan - 02/02/19 0650    Clinical Impression Statement  Patient presents for mod complexity eval of acute on chronic L sided hip pain.  He has a history of low back pain, including stenosis which often refers into bilateral lower legs.  He has tightness in hamstrings as well as anterior hip.  Weakness present in lateral hip abductors bilaterally, L > R.  He is a very active individual and continues to exercise daily, with a Tai Chi and Yoga practice as well as recreational golf.  He should do well with PT intervention to address deficits mentioned and facilitate continued physical activity without limitation of pain.    Personal Factors and Comorbidities  Age;Comorbidity 1    Comorbidities  back pain    Examination-Activity Limitations  Sleep;Stairs;Stand;Locomotion Level;Lift    Examination-Participation Restrictions  Community Activity;Interpersonal Relationship;Other   exercise   Stability/Clinical Decision Making  Evolving/Moderate complexity    Clinical Decision Making  Moderate    Rehab Potential  Excellent    PT Frequency  2x / week    PT Duration  6 weeks    PT Treatment/Interventions  ADLs/Self Care Home Management;Stair training;Functional mobility training;Patient/family education;Therapeutic activities;Moist Heat;Ultrasound;Therapeutic exercise;Passive range of motion;Dry needling;Manual techniques;Taping;Gait training;Electrical Stimulation;Cryotherapy;Balance training;Neuromuscular re-education    PT Next Visit Plan  check HEP, NuStep, manual/modalties for pain, consider DN to L hip, stretching hip    PT Home Exercise Plan  piriformis stretch, clam, bridge with band, supine  clam    Consulted and Agree with Plan of Care  Patient       Patient will benefit from skilled therapeutic intervention in order to improve the following deficits and impairments:  Decreased range of motion, Difficulty walking, Increased fascial restricitons, Pain, Impaired flexibility, Improper body mechanics, Decreased mobility, Decreased strength, Postural dysfunction  Visit Diagnosis: Pain in left hip  Stiffness of left hip, not elsewhere classified  Bilateral low back pain without sciatica, unspecified chronicity     Problem List Patient Active Problem List  Diagnosis Date Noted  . Greater trochanteric bursitis of left hip 12/21/2018  . AC (acromioclavicular) arthritis 11/16/2018  . Rotator cuff tendinitis, left 10/27/2018  . Contusion of right side of back 10/11/2018  . Contusion of left hip 09/27/2018  . Anterior subluxation of left shoulder 06/22/2018  . Hematoma, subungual, thumb, right, initial encounter 04/13/2018  . Hypotension 04/13/2018  . Hair loss 07/17/2017  . Murmur, cardiac 09/24/2016  . Well adult exam 05/21/2016  . Urge incontinence of urine 05/21/2016  . Spinal stenosis of lumbar region with radiculopathy 08/24/2015  . Arthritis of carpometacarpal joint 03/23/2013  . Nonallopathic lesion of thoracic region 12/16/2012  . Hypothyroidism 04/26/2012  . [place] arthritis 10/21/2011  . Wrist pain, chronic, left 10/20/2011  . Nonallopathic lesion of lumbar region 10/24/2010  . Nonallopathic lesion of sacral region 10/24/2010  . Pain in joint, shoulder region 07/16/2010  . CHRONIC KIDNEY DISEASE STAGE III (MODERATE) 03/15/2010  . BACK PAIN, LUMBAR, WITH RADICULOPATHY 08/30/2009  . GERD 03/09/2009  . H/O acute gouty arthritis 05/14/2007  . DEGENERATIVE DISC DISEASE, LUMBAR SPINE 07/24/2006  . Hyperlipidemia 06/25/2006  . ERECTILE DYSFUNCTION 06/25/2006  . HYPERTENSION, BENIGN SYSTEMIC 06/25/2006  . Asthma 06/25/2006    PAA,JENNIFER 02/02/2019, 7:25  AM  [facility] [address] [place], [place], [address] Phone: [phone]   Fax:  [phone]  Name: Rayane Gallardo MRN: [id number] Date of Birth: Jan 21, 1935   Raeford Razor, PT 02/02/19 7:25 AM Phone: [phone] Fax: [phone]

## 2019-02-03 ENCOUNTER — Other Ambulatory Visit: Payer: Self-pay

## 2019-02-03 ENCOUNTER — Ambulatory Visit: Payer: Medicare Other | Admitting: Sports Medicine

## 2019-02-03 VITALS — BP 110/72 | Ht 69.0 in | Wt 190.0 lb

## 2019-02-03 DIAGNOSIS — M7062 Trochanteric bursitis, left hip: Secondary | ICD-10-CM | POA: Diagnosis not present

## 2019-02-03 NOTE — Progress Notes (Signed)
   Subjective:    Patient ID: Paul [name], male    DOB: Aug 28, 1934, 84 y.o.   MRN: [id number]  HPI   Paul [name] comes in today for follow-up on left hip pain.  Hip pain is improving.  Unfortunately there was a mixup with his physical therapy prescription and he was late starting PT.  He has had 1 visit.  They gave him a comprehensive home exercise program consisting of hip abductor strengthening exercises and pelvic stabilizer exercises.  He finished his prednisone without difficulty.  Still has mild pain but it is improved from his previous visit.  In fact, he is on his way to play a round of golf right now.    Review of Systems As above    Objective:   Physical Exam  Well-developed, well-nourished.  No acute distress.  Awake alert and oriented x3.  Vital signs reviewed  Left hip: Smooth painless hip range of motion with a negative logroll.  No tenderness to palpation over the greater trochanter.  Mild tenderness to palpation at the insertion of the gluteus medius.  Positive Trendelenburg.  Neurovascular intact distally.  Walking without the assistance of a cane.      Assessment & Plan:   Improving left hip pain secondary to greater trochanteric bursitis versus gluteus medius tendinopathy  Patient understands the importance of continuing with his home exercises.  I encouraged him to continue with formal physical therapy until the therapist feels like he is ready for discharge.  As long as he continues to improve he can follow-up as needed.

## 2019-02-07 ENCOUNTER — Other Ambulatory Visit: Payer: Self-pay

## 2019-02-07 ENCOUNTER — Ambulatory Visit: Payer: Medicare Other | Admitting: Physical Therapy

## 2019-02-07 ENCOUNTER — Encounter: Payer: Self-pay | Admitting: Physical Therapy

## 2019-02-07 DIAGNOSIS — M25652 Stiffness of left hip, not elsewhere classified: Secondary | ICD-10-CM

## 2019-02-07 DIAGNOSIS — M545 Low back pain, unspecified: Secondary | ICD-10-CM

## 2019-02-07 DIAGNOSIS — M25552 Pain in left hip: Secondary | ICD-10-CM | POA: Diagnosis not present

## 2019-02-07 NOTE — Therapy (Signed)
[place], [place], [address] Phone: [phone]   Fax:  [phone]  Physical Therapy Treatment  Patient Details  Name: Paul [name] MRN: [id number] Date of Birth: 1934-09-15 Referring Provider (PT): Dr. Hulan Saas    Encounter Date: 02/07/2019  PT End of Session - 02/07/19 1107    Visit Number  2    Number of Visits  12    Date for PT Re-Evaluation  03/18/19    Authorization Type  UHC MCR    PT Start Time  9935    PT Stop Time  1055    PT Time Calculation (min)  40 min    Activity Tolerance  Patient tolerated treatment well    Behavior During Therapy  [facility] for tasks assessed/performed       Past Medical History:  Diagnosis Date  . Cataract   . GERD (gastroesophageal reflux disease)   . Hiatal hernia   . Hypertension     Past Surgical History:  Procedure Laterality Date  . CATARACT EXTRACTION, BILATERAL    . TOTAL KNEE ARTHROPLASTY     left  . TRANSURETHRAL RESECTION OF PROSTATE  2011    There were no vitals filed for this visit.  Subjective Assessment - 02/07/19 1017    Subjective  Pt. reports hip "not doing too well" today. He does not wish to try dry needling today and wants to follow up with his doctor regarding his overall status. He has been doing HEP 2x/day.    Pertinent History  spinal stenosis and spondylosis, HTN, GERD    Limitations  Standing;Walking;House hold activities;Lifting    How long can you walk comfortably?  about a mile, walks the dog, about 3/4 mile in has pain, 50-60 min mowed    Diagnostic tests  XR only: Severe lumbar spondylosis    Currently in Pain?  Yes    Pain Score  4     Pain Location  Hip    Pain Orientation  Left;Lateral    Pain Descriptors / Indicators  Aching;Discomfort;Sore    Pain Type  Chronic pain    Pain Onset  More than a month ago    Pain Frequency  Intermittent    Aggravating Factors   standing, walking, left sidelying    Pain Relieving  Factors  rest    Effect of Pain on Daily Activities  limits walking ability and positional tolerance                       [facility] Adult PT Treatment/Exercise - 02/07/19 0001      Exercises   Exercises  Knee/Hip      Knee/Hip Exercises: Stretches   ITB Stretch  Left;3 reps;30 seconds    Piriformis Stretch Limitations  attemoted figure 4 stretch but pt. unable to feel stretch so held    Other Knee/Hip Stretches  left SKTC 5 x 5-10 sec      Knee/Hip Exercises: Standing   Other Standing Knee Exercises  HEP review hip abd-instructed standing abduction with band as alternative to sidelying which pt. had noted as difficult, reviewed clamshell as well supine vs. sidelying      Modalities   Modalities  Ultrasound      Ultrasound   Ultrasound Location  --   left greater trochanteric region   Ultrasound Parameters  1 MHZ cont. 1 W/cm2 x 8 min    Ultrasound Goals  Pain      Manual  Therapy   Manual Therapy  Soft tissue mobilization    Soft tissue mobilization  STM left lateral hip incl. IASTM with roller use             PT Education - 02/07/19 1109    Education Details  POC, symptom etiology and hip anatomy, dry needling, HEP    Person(s) Educated  Patient    Methods  Explanation;Demonstration;Handout;Verbal cues    Comprehension  Returned demonstration;Verbalized understanding          PT Long Term Goals - 02/02/19 0720      PT LONG TERM GOAL #1   Title  Pt will be I with HEP for hip and trunk flexibility, core strength    Time  6    Period  Weeks    Status  New    Target Date  03/18/19      PT LONG TERM GOAL #2   Title  Pt will improve FOTO score to less than 35% limited to show functional improvement    Time  6    Period  Weeks    Status  New    Target Date  03/18/19      PT LONG TERM GOAL #3   Title  Pt will be able to step up with LLE, bear full weight on LLE without pain for mobility in home and performing Tai chi.    Time  6    Period   Weeks    Status  New    Target Date  03/18/19      PT LONG TERM GOAL #4   Title  Pt will be able to lie on L side at night for a portion of the time with pain no more than minimal.    Time  6    Period  Weeks    Status  New    Target Date  03/18/19      PT LONG TERM GOAL #5   Title  Pt will demo lateral hip strength to 4+/5 bilaterally to maximize stability in standing    Time  6    Period  Weeks    Status  New    Target Date  03/18/19            Plan - 02/07/19 1050    Clinical Impression Statement  Pt. frustrated with continued pain with differential diagnosis local hip issue with bursitis/gluteal tendinopathy vs. radicular pain from stenosis. Given local TTP at trochanteric region do continue to suspect local hip issue though underlying stenosis may be impacting walking tolerance as well. Held dry needling per pt. request with focus [place], manual therapy and exercise review. Pt. would benefit from continued PT to help address current associated functional limitations for walking ability due to hip pain.    Personal Factors and Comorbidities  Age;Comorbidity 1    Comorbidities  back pain    Examination-Activity Limitations  Sleep;Stairs;Stand;Locomotion Level;Lift    Examination-Participation Restrictions  Community Activity;Interpersonal Relationship;Other    Stability/Clinical Decision Making  Evolving/Moderate complexity    Clinical Decision Making  Moderate    Rehab Potential  Excellent    PT Frequency  2x / week    PT Duration  6 weeks    PT Treatment/Interventions  ADLs/Self Care Home Management;Stair training;Functional mobility training;Patient/family education;Therapeutic activities;Moist Heat;Ultrasound;Therapeutic exercise;Passive range of motion;Dry needling;Manual techniques;Taping;Gait training;Electrical Stimulation;Cryotherapy;Balance training;Neuromuscular re-education    PT Next Visit Plan  check HEP, NuStep, manual/modalties for pain, pending patient preferences  consider DN to L  hip, stretching hip    PT Home Exercise Plan  piriformis stretch, clam, bridge with band, supine clam    Consulted and Agree with Plan of Care  Patient       Patient will benefit from skilled therapeutic intervention in order to improve the following deficits and impairments:  Decreased range of motion, Difficulty walking, Increased fascial restricitons, Pain, Impaired flexibility, Improper body mechanics, Decreased mobility, Decreased strength, Postural dysfunction  Visit Diagnosis: Pain in left hip  Stiffness of left hip, not elsewhere classified  Bilateral low back pain without sciatica, unspecified chronicity     Problem List Patient Active Problem List   Diagnosis Date Noted  . Greater trochanteric bursitis of left hip 12/21/2018  . AC (acromioclavicular) arthritis 11/16/2018  . Rotator cuff tendinitis, left 10/27/2018  . Contusion of right side of back 10/11/2018  . Contusion of left hip 09/27/2018  . Anterior subluxation of left shoulder 06/22/2018  . Hematoma, subungual, thumb, right, initial encounter 04/13/2018  . Hypotension 04/13/2018  . Hair loss 07/17/2017  . Murmur, cardiac 09/24/2016  . Well adult exam 05/21/2016  . Urge incontinence of urine 05/21/2016  . Spinal stenosis of lumbar region with radiculopathy 08/24/2015  . Arthritis of carpometacarpal joint 03/23/2013  . Nonallopathic lesion of thoracic region 12/16/2012  . Hypothyroidism 04/26/2012  . [place] arthritis 10/21/2011  . Wrist pain, chronic, left 10/20/2011  . Nonallopathic lesion of lumbar region 10/24/2010  . Nonallopathic lesion of sacral region 10/24/2010  . Pain in joint, shoulder region 07/16/2010  . CHRONIC KIDNEY DISEASE STAGE III (MODERATE) 03/15/2010  . BACK PAIN, LUMBAR, WITH RADICULOPATHY 08/30/2009  . GERD 03/09/2009  . H/O acute gouty arthritis 05/14/2007  . DEGENERATIVE DISC DISEASE, LUMBAR SPINE 07/24/2006  . Hyperlipidemia 06/25/2006  . ERECTILE DYSFUNCTION  06/25/2006  . HYPERTENSION, BENIGN SYSTEMIC 06/25/2006  . Asthma 06/25/2006    Beaulah Dinning, PT, DPT 02/07/19 11:16 AM  [facility] [address] [place], [place], [address] Phone: [phone]   Fax:  [phone]  Name: Paul [name] MRN: [id number] Date of Birth: [birth year]

## 2019-02-08 ENCOUNTER — Encounter: Payer: Self-pay | Admitting: Family Medicine

## 2019-02-14 ENCOUNTER — Ambulatory Visit: Payer: Medicare Other | Admitting: Physical Therapy

## 2019-02-14 ENCOUNTER — Encounter: Payer: Self-pay | Admitting: Physical Therapy

## 2019-02-14 ENCOUNTER — Other Ambulatory Visit: Payer: Self-pay | Admitting: Family Medicine

## 2019-02-14 ENCOUNTER — Other Ambulatory Visit: Payer: Self-pay | Admitting: *Deleted

## 2019-02-14 ENCOUNTER — Other Ambulatory Visit: Payer: Self-pay

## 2019-02-14 DIAGNOSIS — M25652 Stiffness of left hip, not elsewhere classified: Secondary | ICD-10-CM

## 2019-02-14 DIAGNOSIS — M545 Low back pain, unspecified: Secondary | ICD-10-CM

## 2019-02-14 DIAGNOSIS — M25552 Pain in left hip: Secondary | ICD-10-CM

## 2019-02-14 DIAGNOSIS — M5137 Other intervertebral disc degeneration, lumbosacral region: Secondary | ICD-10-CM

## 2019-02-14 NOTE — Therapy (Signed)
[place], [place], [address] Phone: [phone]   Fax:  [phone]  Physical Therapy Treatment  Patient Details  Name: Paul [name] MRN: [id number] Date of Birth: December 14, 1934 Referring Provider (PT): Dr. Hulan Saas    Encounter Date: 02/14/2019  PT End of Session - 02/14/19 1147    Visit Number  3    Number of Visits  12    Date for PT Re-Evaluation  03/18/19    Authorization Type  UHC MCR    PT Start Time  1059    PT Stop Time  1143    PT Time Calculation (min)  44 min    Activity Tolerance  Patient tolerated treatment well    Behavior During Therapy  [facility] for tasks assessed/performed       Past Medical History:  Diagnosis Date  . Cataract   . GERD (gastroesophageal reflux disease)   . Hiatal hernia   . Hypertension     Past Surgical History:  Procedure Laterality Date  . CATARACT EXTRACTION, BILATERAL    . TOTAL KNEE ARTHROPLASTY     left  . TRANSURETHRAL RESECTION OF PROSTATE  2011    There were no vitals filed for this visit.  Subjective Assessment - 02/14/19 1102    Subjective  Pt. reports will be pending lumbar injections soon (got call and will try to schedule soon after PT visit today). He continues to defer dry needling. Mild benefit noted after last session. He has been performing HEP from eval visit. Improving tolerance for left sidelying from eval but still limited with prolonged positioning.    Pertinent History  spinal stenosis and spondylosis, HTN, GERD    Currently in Pain?  Yes    Pain Score  3     Pain Location  Hip    Pain Orientation  Left;Lateral    Pain Descriptors / Indicators  Aching;Discomfort;Sore    Pain Type  Chronic pain    Pain Radiating Towards  posterior hip, buttocks, intermittent bilat. LE leg pain with stenosis    Pain Onset  More than a month ago    Pain Frequency  Intermittent    Aggravating Factors   standing, walking, left sidelying    Pain  Relieving Factors  rest (and avoidance left sidelying)    Effect of Pain on Daily Activities  limits walking and positional tolerance                       [facility] Adult PT Treatment/Exercise - 02/14/19 0001      Knee/Hip Exercises: Stretches   ITB Stretch  Left;3 reps;30 seconds      Knee/Hip Exercises: Aerobic   Nustep  L5 x 5 min LE only      Knee/Hip Exercises: Standing   Hip Abduction  Stengthening;Right;Left;1 set;10 reps;Knee straight    Abduction Limitations  green band    Other Standing Knee Exercises  hip hike with left foot on step x 10 from 4 " step    Other Standing Knee Exercises  Tai Chi stepping with green band proximal to knees "Brush knee and push" x 10 feet      Ultrasound   Ultrasound Location  --   left greater trochanteric region   Ultrasound Parameters  1 MHZ cont. 100% 1.0 W/cm2 x 8 min    Ultrasound Goals  Pain      Manual Therapy   Soft tissue mobilization  STM left lateral hip  incl. IASTM with roller use             PT Education - 02/14/19 1147    Education Details  HEP updates    Person(s) Educated  Patient    Methods  Explanation;Demonstration;Verbal cues;Handout    Comprehension  Verbalized understanding;Returned demonstration          PT Long Term Goals - 02/14/19 1149      PT LONG TERM GOAL #1   Title  Pt will be I with HEP for hip and trunk flexibility, core strength    Baseline  updated today, will continue to update prn    Time  6    Period  Weeks    Status  On-going      PT LONG TERM GOAL #2   Title  Pt will improve FOTO score to less than 35% limited to show functional improvement    Time  6    Period  Weeks    Status  On-going      PT LONG TERM GOAL #3   Title  Pt will be able to step up with LLE, bear full weight on LLE without pain for mobility in home and performing Tai chi.    Baseline  still with difficulty    Time  6    Period  Weeks    Status  On-going      PT LONG TERM GOAL #4   Title  Pt  will be able to lie on L side at night for a portion of the time with pain no more than minimal.    Baseline  improving but goal ongoing/still limited6    Time  6    Period  Weeks    Status  On-going      PT LONG TERM GOAL #5   Title  Pt will demo lateral hip strength to 4+/5 bilaterally to maximize stability in standing    Time  6    Period  Weeks    Status  On-going            Plan - 02/14/19 1147    Clinical Impression Statement  Status for left hip complicated by LBP/stenosis but showing mild improvement with hip symptoms and positonal tolerance from baseline. Progressed exercises with standing open and closed chain hip abductor strengthening with good tolerance.    Personal Factors and Comorbidities  Age;Comorbidity 1    Comorbidities  back pain    Examination-Activity Limitations  Sleep;Stairs;Stand;Locomotion Level;Lift    Examination-Participation Restrictions  Community Activity;Interpersonal Relationship;Other    Stability/Clinical Decision Making  Evolving/Moderate complexity    Clinical Decision Making  Moderate    Rehab Potential  Excellent    PT Frequency  2x / week    PT Duration  6 weeks    PT Treatment/Interventions  ADLs/Self Care Home Management;Stair training;Functional mobility training;Patient/family education;Therapeutic activities;Moist Heat;Ultrasound;Therapeutic exercise;Passive range of motion;Dry needling;Manual techniques;Taping;Gait training;Electrical Stimulation;Cryotherapy;Balance training;Neuromuscular re-education    PT Next Visit Plan  check HEP, NuStep, manual/modalties for pain, pending patient preferences consider DN to L hip, stretching hip    PT Home Exercise Plan  piriformis stretch, clam, bridge with band, supine clam, standing abduction with band, hip hike from step    Consulted and Agree with Plan of Care  Patient       Patient will benefit from skilled therapeutic intervention in order to improve the following deficits and  impairments:  Decreased range of motion, Difficulty walking, Increased fascial restricitons, Pain, Impaired flexibility, Improper  body mechanics, Decreased mobility, Decreased strength, Postural dysfunction  Visit Diagnosis: Pain in left hip  Stiffness of left hip, not elsewhere classified  Bilateral low back pain without sciatica, unspecified chronicity     Problem List Patient Active Problem List   Diagnosis Date Noted  . Greater trochanteric bursitis of left hip 12/21/2018  . AC (acromioclavicular) arthritis 11/16/2018  . Rotator cuff tendinitis, left 10/27/2018  . Contusion of right side of back 10/11/2018  . Contusion of left hip 09/27/2018  . Anterior subluxation of left shoulder 06/22/2018  . Hematoma, subungual, thumb, right, initial encounter 04/13/2018  . Hypotension 04/13/2018  . Hair loss 07/17/2017  . Murmur, cardiac 09/24/2016  . Well adult exam 05/21/2016  . Urge incontinence of urine 05/21/2016  . Spinal stenosis of lumbar region with radiculopathy 08/24/2015  . Arthritis of carpometacarpal joint 03/23/2013  . Nonallopathic lesion of thoracic region 12/16/2012  . Hypothyroidism 04/26/2012  . [place] arthritis 10/21/2011  . Wrist pain, chronic, left 10/20/2011  . Nonallopathic lesion of lumbar region 10/24/2010  . Nonallopathic lesion of sacral region 10/24/2010  . Pain in joint, shoulder region 07/16/2010  . CHRONIC KIDNEY DISEASE STAGE III (MODERATE) 03/15/2010  . BACK PAIN, LUMBAR, WITH RADICULOPATHY 08/30/2009  . GERD 03/09/2009  . H/O acute gouty arthritis 05/14/2007  . DEGENERATIVE DISC DISEASE, LUMBAR SPINE 07/24/2006  . Hyperlipidemia 06/25/2006  . ERECTILE DYSFUNCTION 06/25/2006  . HYPERTENSION, BENIGN SYSTEMIC 06/25/2006  . Asthma 06/25/2006   Beaulah Dinning, PT, DPT 02/14/19 11:51 AM  [facility] [address] [place], [place], [address] Phone: [phone]   Fax:  [phone]  Name: Paul [name] MRN: [id number] Date of Birth: August 01, 1934

## 2019-02-16 ENCOUNTER — Ambulatory Visit: Payer: Medicare Other | Admitting: Physical Therapy

## 2019-02-17 ENCOUNTER — Ambulatory Visit
Admission: RE | Admit: 2019-02-17 | Discharge: 2019-02-17 | Disposition: A | Discharge status: Home / Self Care | Payer: Medicare Other | Source: Ambulatory Visit | Attending: Family Medicine | Admitting: Family Medicine

## 2019-02-17 ENCOUNTER — Other Ambulatory Visit: Payer: Self-pay

## 2019-02-17 DIAGNOSIS — M5137 Other intervertebral disc degeneration, lumbosacral region: Secondary | ICD-10-CM

## 2019-02-17 MED ORDER — IOPAMIDOL (ISOVUE-M 200) INJECTION 41%
1.0000 mL | Freq: Once | INTRAMUSCULAR | Status: AC
  Administered 2019-02-17: 1 mL via EPIDURAL

## 2019-02-17 MED ORDER — METHYLPREDNISOLONE ACETATE 40 MG/ML INJ SUSP (RADIOLOG
120.0000 mg | Freq: Once | INTRAMUSCULAR | Status: AC
  Administered 2019-02-17: 120 mg via EPIDURAL

## 2019-02-17 NOTE — Discharge Instructions (Signed)

## 2019-02-21 ENCOUNTER — Encounter: Payer: Self-pay | Admitting: Physical Therapy

## 2019-02-21 ENCOUNTER — Encounter: Payer: Self-pay | Admitting: Family Medicine

## 2019-02-21 ENCOUNTER — Ambulatory Visit: Payer: Medicare Other | Admitting: Physical Therapy

## 2019-02-21 ENCOUNTER — Other Ambulatory Visit: Payer: Self-pay

## 2019-02-21 DIAGNOSIS — M545 Low back pain, unspecified: Secondary | ICD-10-CM

## 2019-02-21 DIAGNOSIS — M25552 Pain in left hip: Secondary | ICD-10-CM

## 2019-02-21 DIAGNOSIS — M25652 Stiffness of left hip, not elsewhere classified: Secondary | ICD-10-CM

## 2019-02-21 NOTE — Therapy (Signed)
[place], [place], [address] Phone: [phone]   Fax:  [phone]  Physical Therapy Treatment  Patient Details  Name: Paul [name] MRN: [id number] Date of Birth: Oct 25, 1934 Referring Provider (PT): Dr. Hulan Saas    Encounter Date: 02/21/2019  PT End of Session - 02/21/19 0925    Visit Number  4    Number of Visits  12    Date for PT Re-Evaluation  03/18/19    Authorization Type  UHC MCR    PT Start Time  5686    PT Stop Time  0923    PT Time Calculation (min)  39 min    Activity Tolerance  Patient tolerated treatment well    Behavior During Therapy  [facility] for tasks assessed/performed       Past Medical History:  Diagnosis Date  . Cataract   . GERD (gastroesophageal reflux disease)   . Hiatal hernia   . Hypertension     Past Surgical History:  Procedure Laterality Date  . CATARACT EXTRACTION, BILATERAL    . TOTAL KNEE ARTHROPLASTY     left  . TRANSURETHRAL RESECTION OF PROSTATE  2011    There were no vitals filed for this visit.  Subjective Assessment - 02/21/19 0845    Subjective  Pt. had epidural injection for his back last week and reports this has also helped with his left hip pain.    Pertinent History  spinal stenosis and spondylosis, HTN, GERD    Limitations  Standing;Walking;House hold activities;Lifting    Currently in Pain?  Yes    Pain Score  1     Pain Location  Hip    Pain Orientation  Left;Lateral    Pain Descriptors / Indicators  Aching;Discomfort;Sore    Pain Type  Chronic pain    Pain Onset  More than a month ago    Pain Frequency  Intermittent    Aggravating Factors   standing, walking, left sidelying    Pain Relieving Factors  rest, avoidance left sidelying    Effect of Pain on Daily Activities  limits walking and positional tolerance                       [facility] Adult PT Treatment/Exercise - 02/21/19 0001      Knee/Hip Exercises: Stretches    ITB Stretch  Left;3 reps;30 seconds    Piriformis Stretch  Left;3 reps;30 seconds      Knee/Hip Exercises: Aerobic   Nustep  L5 x 5 min LE only      Knee/Hip Exercises: Standing   Forward Step Up  Left;15 reps;Hand Hold: 1;Step Height: 6"    Forward Step Up Limitations  with opp hip hike- cues/focus on neutral pelvic position/avoiding left hip drop    Functional Squat Limitations  partial squat at counter with Blue Theraband proximal to knees for abuction strengthening at 15 reps    Other Standing Knee Exercises  hip hike with left LE on step (6 in.) x 15 reps with bilat. UE support at counter      Ultrasound   Ultrasound Location  --   left greater trochanteric region   Ultrasound Parameters  1 MHZ 1.0 W/cm2 cont.    Ultrasound Goals  Pain      Manual Therapy   Manual therapy comments  LAD left hip grade II-III oscillations    Soft tissue mobilization  STM left lateral hip incl. IASTM with roller use  PT Education - 02/21/19 0925    Education Details  Hip anatomy and mechanics, POC    Person(s) Educated  Patient    Methods  Explanation    Comprehension  Verbalized understanding          PT Long Term Goals - 02/14/19 1149      PT LONG TERM GOAL #1   Title  Pt will be I with HEP for hip and trunk flexibility, core strength    Baseline  updated today, will continue to update prn    Time  6    Period  Weeks    Status  On-going      PT LONG TERM GOAL #2   Title  Pt will improve FOTO score to less than 35% limited to show functional improvement    Time  6    Period  Weeks    Status  On-going      PT LONG TERM GOAL #3   Title  Pt will be able to step up with LLE, bear full weight on LLE without pain for mobility in home and performing Tai chi.    Baseline  still with difficulty    Time  6    Period  Weeks    Status  On-going      PT LONG TERM GOAL #4   Title  Pt will be able to lie on L side at night for a portion of the time with pain no more  than minimal.    Baseline  improving but goal ongoing/still limited6    Time  6    Period  Weeks    Status  On-going      PT LONG TERM GOAL #5   Title  Pt will demo lateral hip strength to 4+/5 bilaterally to maximize stability in standing    Time  6    Period  Weeks    Status  On-going            Plan - 02/21/19 0926    Clinical Impression Statement  Continued closed chain exercise progression with hip strengthening which was well-tolerated. Gradually improving from baseline with walking and positional tolerance. Given improvement after lumbar injection would suspect that lumbar region could have been contributing to symptoms vs. residual benefit from steroids in system for less pain.    Personal Factors and Comorbidities  Age;Comorbidity 1    Comorbidities  back pain    Examination-Activity Limitations  Sleep;Stairs;Stand;Locomotion Level;Lift    Examination-Participation Restrictions  Community Activity;Interpersonal Relationship;Other    Stability/Clinical Decision Making  Evolving/Moderate complexity    Clinical Decision Making  Moderate    Rehab Potential  Excellent    PT Frequency  2x / week    PT Duration  6 weeks    PT Treatment/Interventions  ADLs/Self Care Home Management;Stair training;Functional mobility training;Patient/family education;Therapeutic activities;Moist Heat;Ultrasound;Therapeutic exercise;Passive range of motion;Dry needling;Manual techniques;Taping;Gait training;Electrical Stimulation;Cryotherapy;Balance training;Neuromuscular re-education    PT Next Visit Plan  NuStep, manual/modalties for pain, stretching hip    PT Home Exercise Plan  piriformis stretch, clam, bridge with band, supine clam, standing abduction with band, hip hike from step    Consulted and Agree with Plan of Care  Patient       Patient will benefit from skilled therapeutic intervention in order to improve the following deficits and impairments:  Decreased range of motion, Difficulty  walking, Increased fascial restricitons, Pain, Impaired flexibility, Improper body mechanics, Decreased mobility, Decreased strength, Postural dysfunction  Visit Diagnosis: Pain in  left hip  Stiffness of left hip, not elsewhere classified  Bilateral low back pain without sciatica, unspecified chronicity     Problem List Patient Active Problem List   Diagnosis Date Noted  . Greater trochanteric bursitis of left hip 12/21/2018  . AC (acromioclavicular) arthritis 11/16/2018  . Rotator cuff tendinitis, left 10/27/2018  . Contusion of right side of back 10/11/2018  . Contusion of left hip 09/27/2018  . Anterior subluxation of left shoulder 06/22/2018  . Hematoma, subungual, thumb, right, initial encounter 04/13/2018  . Hypotension 04/13/2018  . Hair loss 07/17/2017  . Murmur, cardiac 09/24/2016  . Well adult exam 05/21/2016  . Urge incontinence of urine 05/21/2016  . Spinal stenosis of lumbar region with radiculopathy 08/24/2015  . Arthritis of carpometacarpal joint 03/23/2013  . Nonallopathic lesion of thoracic region 12/16/2012  . Hypothyroidism 04/26/2012  . [place] arthritis 10/21/2011  . Wrist pain, chronic, left 10/20/2011  . Nonallopathic lesion of lumbar region 10/24/2010  . Nonallopathic lesion of sacral region 10/24/2010  . Pain in joint, shoulder region 07/16/2010  . CHRONIC KIDNEY DISEASE STAGE III (MODERATE) 03/15/2010  . BACK PAIN, LUMBAR, WITH RADICULOPATHY 08/30/2009  . GERD 03/09/2009  . H/O acute gouty arthritis 05/14/2007  . DEGENERATIVE DISC DISEASE, LUMBAR SPINE 07/24/2006  . Hyperlipidemia 06/25/2006  . ERECTILE DYSFUNCTION 06/25/2006  . HYPERTENSION, BENIGN SYSTEMIC 06/25/2006  . Asthma 06/25/2006    Beaulah Dinning, PT, DPT 02/21/19 9:31 AM  [facility] [address] [place], [place], [address] Phone: [phone]   Fax:  [phone]  Name: Paul [name] MRN: [id number] Date of Birth:  [birth year]

## 2019-02-22 ENCOUNTER — Ambulatory Visit: Payer: Medicare Other | Admitting: Family Medicine

## 2019-02-23 ENCOUNTER — Ambulatory Visit: Payer: Medicare Other | Admitting: Physical Therapy

## 2019-02-28 ENCOUNTER — Encounter: Payer: Self-pay | Admitting: Physical Therapy

## 2019-02-28 ENCOUNTER — Ambulatory Visit: Payer: Medicare Other | Attending: Family Medicine | Admitting: Physical Therapy

## 2019-02-28 ENCOUNTER — Other Ambulatory Visit: Payer: Self-pay

## 2019-02-28 DIAGNOSIS — M545 Low back pain, unspecified: Secondary | ICD-10-CM

## 2019-02-28 DIAGNOSIS — M25652 Stiffness of left hip, not elsewhere classified: Secondary | ICD-10-CM | POA: Diagnosis present

## 2019-02-28 DIAGNOSIS — M25552 Pain in left hip: Secondary | ICD-10-CM | POA: Insufficient documentation

## 2019-02-28 NOTE — Therapy (Signed)
[place], [place], [address] Phone: [phone]   Fax:  [phone]  Physical Therapy Treatment  Patient Details  Name: Paul [name] MRN: [id number] Date of Birth: 12/27/1934 Referring Provider (PT): Dr. Hulan Saas    Encounter Date: 02/28/2019  PT End of Session - 02/28/19 0926    Visit Number  5    Number of Visits  12    Date for PT Re-Evaluation  03/18/19    Authorization Type  [employer] MCR    PT Start Time  0846    PT Stop Time  0924    PT Time Calculation (min)  38 min    Activity Tolerance  Patient tolerated treatment well    Behavior During Therapy  [facility] for tasks assessed/performed       Past Medical History:  Diagnosis Date  . Cataract   . GERD (gastroesophageal reflux disease)   . Hiatal hernia   . Hypertension     Past Surgical History:  Procedure Laterality Date  . CATARACT EXTRACTION, BILATERAL    . TOTAL KNEE ARTHROPLASTY     left  . TRANSURETHRAL RESECTION OF PROSTATE  2011    There were no vitals filed for this visit.  Subjective Assessment - 02/28/19 0847    Subjective  Hip and back feel OK this AM. No new complaints/concerns this AM.    Currently in Pain?  No/denies         [facility] PT Assessment - 02/28/19 0001      Strength   Left Hip ABduction  4/5                   [facility] Adult PT Treatment/Exercise - 02/28/19 0001      Knee/Hip Exercises: Stretches   ITB Stretch  Left;3 reps;30 seconds    Piriformis Stretch  Left;3 reps;30 seconds    Piriformis Stretch Limitations  also instructed/reviewed HEP varitiations including with strap use      Knee/Hip Exercises: Aerobic   Nustep  L5 x 5 min LE only      Knee/Hip Exercises: Standing   Forward Step Up  Left;2 sets;10 reps;Hand Hold: 1    Forward Step Up Limitations  on blue side BOSU    Functional Squat Limitations  partial squat at counter with Blue Theraband proximal to knees for abuction  strengthening 2x10    Other Standing Knee Exercises  sidesteps with blut band at ankle x 10 feet ea. way      Ultrasound   Ultrasound Location  --   left hip-greater trochanteric region   Ultrasound Parameters  1 MHZ 1.0 W/cm2 50% x 8 min    Ultrasound Goals  Pain      Manual Therapy   Manual therapy comments  --    Soft tissue mobilization  STM left lateral hip incl. IASTM with roller use             PT Education - 02/28/19 0925    Education Details  POC    Person(s) Educated  Patient    Methods  Explanation    Comprehension  Verbalized understanding          PT Long Term Goals - 02/28/19 0926      PT LONG TERM GOAL #1   Title  Pt will be I with HEP for hip and trunk flexibility, core strength    Baseline  met    Time  6    Period  Weeks  Status  Achieved      PT LONG TERM GOAL #2   Title  Pt will improve FOTO score to less than 35% limited to show functional improvement    Time  6    Period  Weeks    Status  On-going      PT LONG TERM GOAL #3   Title  Pt will be able to step up with LLE, bear full weight on LLE without pain for mobility in home and performing Tai chi.    Baseline  improving from baseline, minimal current pain    Time  6    Period  Weeks    Status  On-going      PT LONG TERM GOAL #4   Title  Pt will be able to lie on L side at night for a portion of the time with pain no more than minimal.    Baseline  improving but goal ongoing/still limited    Time  6    Period  Weeks    Status  On-going      PT LONG TERM GOAL #5   Title  Pt will demo lateral hip strength to 4+/5 bilaterally to maximize stability in standing    Baseline  4/5    Time  6    Period  Weeks    Status  On-going            Plan - 02/28/19 0929    Clinical Impression Statement  Still with left lateral hpi weakness impacting gait mechanics but pt. improving from baseline with increasing hip strength and improved tolerance for walking as well as with positional  tolerance for sidelying.    Personal Factors and Comorbidities  Age;Comorbidity 1    Comorbidities  back pain    Examination-Activity Limitations  Sleep;Stairs;Stand;Locomotion Level;Lift    Examination-Participation Restrictions  Community Activity;Interpersonal Relationship;Other    Clinical Decision Making  Moderate    Rehab Potential  Excellent    PT Frequency  2x / week    PT Duration  6 weeks    PT Treatment/Interventions  ADLs/Self Care Home Management;Stair training;Functional mobility training;Patient/family education;Therapeutic activities;Moist Heat;Ultrasound;Therapeutic exercise;Passive range of motion;Dry needling;Manual techniques;Taping;Gait training;Electrical Stimulation;Cryotherapy;Balance training;Neuromuscular re-education    PT Next Visit Plan  NuStep, manual/modalties for pain, stretching hip    PT Home Exercise Plan  piriformis stretch, clam, bridge with band, supine clam, standing abduction with band, hip hike from step    Consulted and Agree with Plan of Care  Patient       Patient will benefit from skilled therapeutic intervention in order to improve the following deficits and impairments:  Decreased range of motion, Difficulty walking, Increased fascial restricitons, Pain, Impaired flexibility, Improper body mechanics, Decreased mobility, Decreased strength, Postural dysfunction  Visit Diagnosis: Pain in left hip  Stiffness of left hip, not elsewhere classified  Bilateral low back pain without sciatica, unspecified chronicity     Problem List Patient Active Problem List   Diagnosis Date Noted  . Greater trochanteric bursitis of left hip 12/21/2018  . AC (acromioclavicular) arthritis 11/16/2018  . Rotator cuff tendinitis, left 10/27/2018  . Contusion of right side of back 10/11/2018  . Contusion of left hip 09/27/2018  . Anterior subluxation of left shoulder 06/22/2018  . Hematoma, subungual, thumb, right, initial encounter 04/13/2018  . Hypotension  04/13/2018  . Hair loss 07/17/2017  . Murmur, cardiac 09/24/2016  . Well adult exam 05/21/2016  . Urge incontinence of urine 05/21/2016  . Spinal stenosis of  lumbar region with radiculopathy 08/24/2015  . Arthritis of carpometacarpal joint 03/23/2013  . Nonallopathic lesion of thoracic region 12/16/2012  . Hypothyroidism 04/26/2012  . [place] arthritis 10/21/2011  . Wrist pain, chronic, left 10/20/2011  . Nonallopathic lesion of lumbar region 10/24/2010  . Nonallopathic lesion of sacral region 10/24/2010  . Pain in joint, shoulder region 07/16/2010  . CHRONIC KIDNEY DISEASE STAGE III (MODERATE) 03/15/2010  . BACK PAIN, LUMBAR, WITH RADICULOPATHY 08/30/2009  . GERD 03/09/2009  . H/O acute gouty arthritis 05/14/2007  . DEGENERATIVE DISC DISEASE, LUMBAR SPINE 07/24/2006  . Hyperlipidemia 06/25/2006  . ERECTILE DYSFUNCTION 06/25/2006  . HYPERTENSION, BENIGN SYSTEMIC 06/25/2006  . Asthma 06/25/2006    Beaulah Dinning, PT, DPT 02/28/19 9:31 AM  [facility] [address] [place], [place], [address] Phone: [phone]   Fax:  [phone]  Name: Capers Hagmann MRN: [id number] Date of Birth: [birth year]

## 2019-03-02 ENCOUNTER — Ambulatory Visit: Payer: Medicare Other | Admitting: Physical Therapy

## 2019-03-09 NOTE — Therapy (Signed)
[place], [place], [address] Phone: [phone]   Fax:  [phone]  Physical Therapy Treatment/Discharge  Patient Details  Name: Paul [name] MRN: [id number] Date of Birth: 1934/12/02 Referring Provider (PT): Dr. Hulan Saas    Encounter Date: 02/28/2019    Past Medical History:  Diagnosis Date  . Cataract   . GERD (gastroesophageal reflux disease)   . Hiatal hernia   . Hypertension     Past Surgical History:  Procedure Laterality Date  . CATARACT EXTRACTION, BILATERAL    . TOTAL KNEE ARTHROPLASTY     left  . TRANSURETHRAL RESECTION OF PROSTATE  2011    There were no vitals filed for this visit.                                 PT Long Term Goals - 02/28/19 0926      PT LONG TERM GOAL #1   Title  Pt will be I with HEP for hip and trunk flexibility, core strength    Baseline  met    Time  6    Period  Weeks    Status  Achieved      PT LONG TERM GOAL #2   Title  Pt will improve FOTO score to less than 35% limited to show functional improvement    Time  6    Period  Weeks    Status  On-going      PT LONG TERM GOAL #3   Title  Pt will be able to step up with LLE, bear full weight on LLE without pain for mobility in home and performing Tai chi.    Baseline  improving from baseline, minimal current pain    Time  6    Period  Weeks    Status  On-going      PT LONG TERM GOAL #4   Title  Pt will be able to lie on L side at night for a portion of the time with pain no more than minimal.    Baseline  improving but goal ongoing/still limited    Time  6    Period  Weeks    Status  On-going      PT LONG TERM GOAL #5   Title  Pt will demo lateral hip strength to 4+/5 bilaterally to maximize stability in standing    Baseline  4/5    Time  6    Period  Weeks    Status  On-going              Patient will benefit from skilled therapeutic intervention  in order to improve the following deficits and impairments:  Decreased range of motion, Difficulty walking, Increased fascial restricitons, Pain, Impaired flexibility, Improper body mechanics, Decreased mobility, Decreased strength, Postural dysfunction  Visit Diagnosis: Pain in left hip  Stiffness of left hip, not elsewhere classified  Bilateral low back pain without sciatica, unspecified chronicity     Problem List Patient Active Problem List   Diagnosis Date Noted  . Greater trochanteric bursitis of left hip 12/21/2018  . AC (acromioclavicular) arthritis 11/16/2018  . Rotator cuff tendinitis, left 10/27/2018  . Contusion of right side of back 10/11/2018  . Contusion of left hip 09/27/2018  . Anterior subluxation of left shoulder 06/22/2018  . Hematoma, subungual, thumb, right, initial encounter 04/13/2018  . Hypotension 04/13/2018  . Hair loss 07/17/2017  .  Murmur, cardiac 09/24/2016  . Well adult exam 05/21/2016  . Urge incontinence of urine 05/21/2016  . Spinal stenosis of lumbar region with radiculopathy 08/24/2015  . Arthritis of carpometacarpal joint 03/23/2013  . Nonallopathic lesion of thoracic region 12/16/2012  . Hypothyroidism 04/26/2012  . [place] arthritis 10/21/2011  . Wrist pain, chronic, left 10/20/2011  . Nonallopathic lesion of lumbar region 10/24/2010  . Nonallopathic lesion of sacral region 10/24/2010  . Pain in joint, shoulder region 07/16/2010  . CHRONIC KIDNEY DISEASE STAGE III (MODERATE) 03/15/2010  . BACK PAIN, LUMBAR, WITH RADICULOPATHY 08/30/2009  . GERD 03/09/2009  . H/O acute gouty arthritis 05/14/2007  . DEGENERATIVE DISC DISEASE, LUMBAR SPINE 07/24/2006  . Hyperlipidemia 06/25/2006  . ERECTILE DYSFUNCTION 06/25/2006  . HYPERTENSION, BENIGN SYSTEMIC 06/25/2006  . Asthma 06/25/2006        PHYSICAL THERAPY DISCHARGE SUMMARY  Visits from Start of Care: 5  Current functional level related to goals / functional outcomes: D/c per patient  request-patient wishing to continue with HEP. Improvement from baseline status for walking and left sidelying positional tolerance-feels able to manage/continue independently.   Remaining deficits: NA   Education / Equipment: NA Plan: Patient agrees to discharge.  Patient goals were partially met. Patient is being discharged due to the patient's request.  ?????          Paul [name], PT, DPT 03/09/19 4:08 PM    [facility] [address] [place], [place], [address] Phone: [phone]   Fax:  [phone]  Name: Paul [name] MRN: [id number] Date of Birth: Sep 19, 1934

## 2019-03-21 ENCOUNTER — Telehealth: Payer: Self-pay

## 2019-03-21 NOTE — Telephone Encounter (Signed)
Left patient message to schedule with Dr. Tamala Julian.

## 2019-03-28 ENCOUNTER — Ambulatory Visit (INDEPENDENT_AMBULATORY_CARE_PROVIDER_SITE_OTHER): Payer: Medicare Other | Admitting: Family Medicine

## 2019-03-28 ENCOUNTER — Other Ambulatory Visit: Payer: Self-pay

## 2019-03-28 ENCOUNTER — Encounter: Payer: Self-pay | Admitting: Family Medicine

## 2019-03-28 VITALS — BP 132/90 | HR 64 | Ht 69.0 in | Wt 190.0 lb

## 2019-03-28 DIAGNOSIS — M999 Biomechanical lesion, unspecified: Secondary | ICD-10-CM | POA: Diagnosis not present

## 2019-03-28 DIAGNOSIS — M48061 Spinal stenosis, lumbar region without neurogenic claudication: Secondary | ICD-10-CM

## 2019-03-28 DIAGNOSIS — M5416 Radiculopathy, lumbar region: Secondary | ICD-10-CM

## 2019-03-28 DIAGNOSIS — M9983 Other biomechanical lesions of lumbar region: Secondary | ICD-10-CM | POA: Diagnosis not present

## 2019-03-28 NOTE — Progress Notes (Signed)
[name] [address] [place], [place] [address] Phone: [phone] Subjective:   Paul [name], am serving as a scribe for Dr. Antoine Primas.   This visit occurred during the SARS-CoV-2 public health emergency.  Safety protocols were in place, including screening questions prior to the visit, additional usage of staff PPE, and extensive cleaning of exam room while observing appropriate contact time as indicated for disinfecting solutions.    CC: Back pain follow-up  UUE:[id number]  Paul [name] is a 84 y.o. male coming in with complaint of back pain. Last seen on 01/21/2019 for OMT. Patient states that his hip pain is improving. Did go to physical therapy but is finished.    Past Medical History:  Diagnosis Date  . Cataract   . GERD (gastroesophageal reflux disease)   . Hiatal hernia   . Hypertension    Past Surgical History:  Procedure Laterality Date  . CATARACT EXTRACTION, BILATERAL    . TOTAL KNEE ARTHROPLASTY     left  . TRANSURETHRAL RESECTION OF PROSTATE  2011   Social History   Socioeconomic History  . Marital status: Legally Separated    Spouse name: Not on file  . Number of children: 0  . Years of education: Not on file  . Highest education level: Not on file  Occupational History  . 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 Financial resource strain: Not on file  . Food insecurity    Worry: Not on file    Inability: Not on file  . Transportation needs    Medical: Not on file    Non-medical: Not on file  Tobacco Use  . Smoking status: Former Smoker    Quit date: 04/28/1972    Years since quitting: 46.9  . Smokeless tobacco: Never Used  Substance and Sexual Activity  . Alcohol use: Yes    Alcohol/week: 7.0 standard drinks    Types: 7 Standard drinks or equivalent per week    Comment: 1 glass of wine a night  . Drug use: No  . Sexual  activity: Yes  Lifestyle  . Physical activity    Days per week: Not on file    Minutes per session: Not on file  . Stress: Not on file  Relationships  . Social Musician on phone: Not on file    Gets together: Not on file    Attends religious service: Not on file    Active member of club or organization: Not on file    Attends meetings of clubs or organizations: Not on file    Relationship status: Not on file  Other Topics Concern  . Not on file  Social History Narrative   Health Care POA:    Emergency Contact:    End of Life Plan:    Who lives with you: Lives by himself in 1 story home.    Any pets: none   Diet: Patient has a varied diet of protein, starch, and vegetables.  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   Exercise: Patient golfs several times a week and does yoga at home 2x week.    Seatbelts: Patient reports wearing seat belt when in vehicle.   [name] Exposure/Protection: Patient reports wearing sun screen intermittently.    Hobbies: golfing, teaching at [employer]      Allergies  Allergen  Reactions  . Ace Inhibitors Swelling    After taking for long time had angioedema on lisinopril  . Lisinopril     Other reaction(s): Facial swelling   Family History  Problem Relation Age of Onset  . Diabetes Mother   . Kidney disease Father   . Cancer Sister        ? pancreatic  . Heart disease Brother   . Cancer Sister        ? pancreatic  . Colon cancer Neg Hx   . Stomach cancer Neg Hx   . Rectal cancer Neg Hx   . Esophageal cancer Neg Hx   . Liver cancer Neg Hx      Current Outpatient Medications (Cardiovascular):  .  cloNIDine (CATAPRES - DOSED IN MG/24 HR) 0.3 mg/24hr patch, APPLY 1 PATCH EVERY WEEK .  felodipine (PLENDIL) 10 MG 24 hr tablet, TAKE 1 TABLET(10 MG) BY MOUTH DAILY .  hydrochlorothiazide (HYDRODIURIL) 25 MG tablet, Take one half tablet daily .  pravastatin (PRAVACHOL) 40 MG tablet, Take 1 tablet (40 mg total)  by mouth daily. .  sildenafil (REVATIO) 20 MG tablet, TAKE 1 TABLET BY MOUTH AS DIRECTED FOR ERECTILE DYSFUNCTION   Current Outpatient Medications (Analgesics):  .  allopurinol (ZYLOPRIM) 100 MG tablet, Take 2 tablets (200 mg total) by mouth daily. Marland Kitchen.  aspirin 81 MG chewable tablet, Chew 81 mg by mouth daily.    Current Outpatient Medications (Hematological):  Marland Kitchen.  Cyanocobalamin (VITAMIN B-12 PO), Take 1 capsule by mouth daily.  Current Outpatient Medications (Other):  Marland Kitchen.  Cholecalciferol (VITAMIN D3 PO), Take 4,000 Units by mouth daily.  .  diclofenac sodium (VOLTAREN) 1 % GEL, Apply 2 g topically 4 (four) times daily. .  famotidine (PEPCID) 20 MG tablet, Take 1 tablet (20 mg total) by mouth 2 (two) times daily. .  Multiple Vitamins-Minerals (ICAPS AREDS 2 PO), Take 1 capsule by mouth 2 (two) times a day. Marland Kitchen.  MYRBETRIQ 25 MG TB24 tablet, TAKE 1 TABLET BY MOUTH ONCE DAILY .  pantoprazole (PROTONIX) 40 MG tablet, TAKE 1 TABLET BY MOUTH ONCE DAILY .  polyethylene glycol powder (GLYCOLAX/MIRALAX) powder, TAKE 17GM(DISSOLVED IN WATER) BY MOUTH TWICE A DAY AT 10 PM AND 5 PM (Patient taking differently: TAKE 17GM(DISSOLVED IN WATER) BY MOUTH ONCE DAILY) .  Pyridoxine HCl (VITAMIN B6 PO), Take 1 capsule by mouth daily. .  tizanidine (ZANAFLEX) 2 MG capsule, Take 1 capsule (2 mg total) by mouth at bedtime.    Past medical history, social, surgical and family history all reviewed in electronic medical record.  No pertanent information unless stated regarding to the chief complaint.   Review of Systems:  No headache, visual changes, nausea, vomiting, diarrhea, constipation, dizziness, abdominal pain, skin rash, fevers, chills, night sweats, weight loss, swollen lymph nodes, body aches, joint swelling, chest pain, shortness of breath, mood changes.  Positive muscle aches  Objective  Blood pressure 132/90, pulse 64, height 5\' 9"  (1.753 m), weight 190 lb (86.2 kg), SpO2 98 %.    General: No apparent  distress alert and oriented x3 mood and affect normal, dressed appropriately.  HEENT: Pupils equal, extraocular movements intact  Respiratory: Patient's speak in full sentences and does not appear short of breath  Cardiovascular: No lower extremity edema, non tender, no erythema  Skin: Warm dry intact with no signs of infection or rash on extremities or on axial skeleton.  Abdomen: Soft nontender  Neuro: Cranial nerves II through XII are intact, neurovascularly intact in all  extremities with 2+ DTRs and 2+ pulses.  Lymph: No lymphadenopathy of posterior or anterior cervical chain or axillae bilaterally.  Gait normal with good balance and coordination.  MSK:  Non tender with full range of motion and good stability and symmetric strength and tone of shoulders, elbows, wrist, hip, knee and ankles bilaterally.  Back exam shows loss of lordosis.  Tender to palpation in the paraspinal musculature lumbar spine right greater than left.  Tightness with Corky Sox and tightness with straight leg test on the right but no radicular symptoms but patient does have some mild weakness of dorsiflexion of the right foot compared to the contralateral side seems to be neurovascularly intact distally.   Osteopathic findings   T9 extended rotated and side bent left L1 flexed rotated and side bent right L4 flexed rotated and side bent left Sacrum right on right    Impression and Recommendations:     This case required medical decision making of moderate complexity. The above documentation has been reviewed and is accurate and complete Lyndal Pulley, DO       Note: This dictation was prepared with Dragon dictation along with smaller phrase technology. Any transcriptional errors that result from this process are unintentional.

## 2019-03-28 NOTE — Assessment & Plan Note (Signed)
Patient has some mild weakness noted of the right leg with dorsiflexion.  Discussed a heel lift, patient knows if worsening symptoms we will need to consider another epidural.  Patient otherwise also restart the exercises on a more regular basis.  Patient is to increase activity slowly over the course the next several days.  Follow-up again in 4 to 8 weeks

## 2019-03-28 NOTE — Assessment & Plan Note (Signed)
Decision today to treat with OMT was based on Physical Exam  After verbal consent patient was treated with HVLA, ME, FPR techniques in , thoracic, lumbar and sacral areas  Patient tolerated the procedure well with improvement in symptoms  Patient given exercises, stretches and lifestyle modifications  See medications in patient instructions if given  Patient will follow up in 4-8 weeks 

## 2019-03-28 NOTE — Patient Instructions (Signed)
Heel lift backwards in the toe in the shoe Exercises 3x a week See me again in 5-6 weeks

## 2019-05-03 ENCOUNTER — Encounter: Payer: Self-pay | Admitting: Family Medicine

## 2019-05-03 ENCOUNTER — Ambulatory Visit (INDEPENDENT_AMBULATORY_CARE_PROVIDER_SITE_OTHER): Payer: Medicare PPO | Admitting: Family Medicine

## 2019-05-03 ENCOUNTER — Other Ambulatory Visit: Payer: Self-pay

## 2019-05-03 VITALS — BP 138/82 | HR 52 | Ht 69.0 in | Wt 193.0 lb

## 2019-05-03 DIAGNOSIS — M999 Biomechanical lesion, unspecified: Secondary | ICD-10-CM | POA: Diagnosis not present

## 2019-05-03 DIAGNOSIS — M48061 Spinal stenosis, lumbar region without neurogenic claudication: Secondary | ICD-10-CM

## 2019-05-03 DIAGNOSIS — M5416 Radiculopathy, lumbar region: Secondary | ICD-10-CM

## 2019-05-03 NOTE — Assessment & Plan Note (Signed)
Decision today to treat with OMT was based on Physical Exam  After verbal consent patient was treated with HVLA, ME, FPR techniques in  thoracic, lumbar and sacral areas  Patient tolerated the procedure well with improvement in symptoms  Patient given exercises, stretches and lifestyle modifications  See medications in patient instructions if given  Patient will follow up in 4 weeks 

## 2019-05-03 NOTE — Assessment & Plan Note (Signed)
Continues to have some difficulties and does have tightness of the lower extremities again.  We discussed another possible epidural.  Patient will have this ordered but will consider not giving it if possible.  We discussed with patient both secondary to the coronavirus outbreak that elective procedures may be halted in the near future.  Patient understands this.  Encouraged him to be safe and active at home as much as possible.  Follow-up again in 4 weeks for more manipulation

## 2019-05-03 NOTE — Progress Notes (Signed)
[name] [address] [place] [address] Phone: [phone] Subjective:   Paul [name], am serving as a scribe for Dr. Antoine Primas. This visit occurred during the SARS-CoV-2 public health emergency.  Safety protocols were in place, including screening questions prior to the visit, additional usage of staff PPE, and extensive cleaning of exam room while observing appropriate contact time as indicated for disinfecting solutions.   I'm seeing this patient by the request  of:    CC: Low back pain follow-up  IOE:[id number]  Paul [name] is a 85 y.o. male coming in with complaint of back pain. Last seen on 03/28/2019 for OMT. Patient states doing relatively well.  Has had some tightness.  Continues to play golf regularly, not working out quite as regularly as he normally would.  Has not been riding his bike quite as recently either.  Avoiding the gym on a regular basis secondary to the coronavirus outbreak.  Does have early workout gym though in his house just has not been using it quite as frequently.      Past Medical History:  Diagnosis Date  . Cataract   . GERD (gastroesophageal reflux disease)   . Hiatal hernia   . Hypertension    Past Surgical History:  Procedure Laterality Date  . CATARACT EXTRACTION, BILATERAL    . TOTAL KNEE ARTHROPLASTY     left  . TRANSURETHRAL RESECTION OF PROSTATE  2011   Social History   Socioeconomic History  . Marital status: Legally Separated    Spouse name: Not on file  . Number of children: 0  . Years of education: Not on file  . Highest education level: Not on file  Occupational History  . 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 Smoking status: Former Smoker    Quit date: 04/28/1972    Years since quitting: 47.0  . Smokeless tobacco: Never Used  Substance and Sexual Activity  . Alcohol use: Yes    Alcohol/week: 7.0  standard drinks    Types: 7 Standard drinks or equivalent per week    Comment: 1 glass of wine a night  . Drug use: No  . Sexual activity: Yes  Other Topics Concern  . Not on file  Social History Narrative   Health Care POA:    Emergency Contact:    End of Life Plan:    Who lives with you: Lives by himself in 1 story home.    Any pets: none   Diet: Patient has a varied diet of protein, starch, and vegetables.  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   Exercise: Patient golfs several times a week and does yoga at home 2x week.    Seatbelts: Patient reports wearing seat belt when in vehicle.   [name] Exposure/Protection: Patient reports wearing sun screen intermittently.    Hobbies: golfing, teaching at [employer]      Social Determinants of Health   Financial Resource Strain:   . Difficulty of Paying Living Expenses: Not on file  Food Insecurity:   . Worried About Programme researcher, broadcasting/film/video in the Last Year: Not on file  . Ran Out of Food in the Last Year: Not on file  Transportation Needs:   . Lack of Transportation (Medical): Not on file  . Lack of Transportation (Non-Medical): Not on file  Physical Activity:   .  Days of Exercise per Week: Not on file  . Minutes of Exercise per Session: Not on file  Stress:   . Feeling of Stress : Not on file  Social Connections:   . Frequency of Communication with Friends and Family: Not on file  . Frequency of Social Gatherings with Friends and Family: Not on file  . Attends Religious Services: Not on file  . Active Member of Clubs or Organizations: Not on file  . Attends Banker Meetings: Not on file  . Marital Status: Not on file   Allergies  Allergen Reactions  . Ace Inhibitors Swelling    After taking for long time had angioedema on lisinopril  . Lisinopril     Other reaction(s): Facial swelling   Family History  Problem Relation Age of Onset  . Diabetes Mother   . Kidney disease Father   .  Cancer Sister        ? pancreatic  . Heart disease Brother   . Cancer Sister        ? pancreatic  . Colon cancer Neg Hx   . Stomach cancer Neg Hx   . Rectal cancer Neg Hx   . Esophageal cancer Neg Hx   . Liver cancer Neg Hx      Current Outpatient Medications (Cardiovascular):  .  cloNIDine (CATAPRES - DOSED IN MG/24 HR) 0.3 mg/24hr patch, APPLY 1 PATCH EVERY WEEK .  felodipine (PLENDIL) 10 MG 24 hr tablet, TAKE 1 TABLET(10 MG) BY MOUTH DAILY .  hydrochlorothiazide (HYDRODIURIL) 25 MG tablet, Take one half tablet daily .  pravastatin (PRAVACHOL) 40 MG tablet, Take 1 tablet (40 mg total) by mouth daily. .  sildenafil (REVATIO) 20 MG tablet, TAKE 1 TABLET BY MOUTH AS DIRECTED FOR ERECTILE DYSFUNCTION   Current Outpatient Medications (Analgesics):  .  allopurinol (ZYLOPRIM) 100 MG tablet, Take 2 tablets (200 mg total) by mouth daily. Marland Kitchen  aspirin 81 MG chewable tablet, Chew 81 mg by mouth daily.    Current Outpatient Medications (Hematological):  Marland Kitchen  Cyanocobalamin (VITAMIN B-12 PO), Take 1 capsule by mouth daily.  Current Outpatient Medications (Other):  Marland Kitchen  Cholecalciferol (VITAMIN D3 PO), Take 4,000 Units by mouth daily.  .  diclofenac sodium (VOLTAREN) 1 % GEL, Apply 2 g topically 4 (four) times daily. .  famotidine (PEPCID) 20 MG tablet, Take 1 tablet (20 mg total) by mouth 2 (two) times daily. .  Multiple Vitamins-Minerals (ICAPS AREDS 2 PO), Take 1 capsule by mouth 2 (two) times a day. Marland Kitchen  MYRBETRIQ 25 MG TB24 tablet, TAKE 1 TABLET BY MOUTH ONCE DAILY .  pantoprazole (PROTONIX) 40 MG tablet, TAKE 1 TABLET BY MOUTH ONCE DAILY .  polyethylene glycol powder (GLYCOLAX/MIRALAX) powder, TAKE 17GM(DISSOLVED IN WATER) BY MOUTH TWICE A DAY AT 10 PM AND 5 PM (Patient taking differently: TAKE 17GM(DISSOLVED IN WATER) BY MOUTH ONCE DAILY) .  Pyridoxine HCl (VITAMIN B6 PO), Take 1 capsule by mouth daily. .  tizanidine (ZANAFLEX) 2 MG capsule, Take 1 capsule (2 mg total) by mouth at  bedtime.    Past medical history, social, surgical and family history all reviewed in electronic medical record.  No pertanent information unless stated regarding to the chief complaint.   Review of Systems:  No headache, visual changes, nausea, vomiting, diarrhea, constipation, dizziness, abdominal pain, skin rash, fevers, chills, night sweats, weight loss, swollen lymph nodes, body aches, joint swelling,  chest pain, shortness of breath, mood changes.  Positive muscle aches  Objective  Blood pressure 138/82, pulse (!) 52, height 5\' 9"  (1.753 m), weight 193 lb (87.5 kg), SpO2 99 %.    General: No apparent distress alert and oriented x3 mood and affect normal, dressed appropriately.  HEENT: Pupils equal, extraocular movements intact  Respiratory: Patient's speak in full sentences and does not appear short of breath  Cardiovascular: No lower extremity edema, non tender, no erythema  Skin: Warm dry intact with no signs of infection or rash on extremities or on axial skeleton.  Abdomen: Soft nontender  Neuro: Cranial nerves II through XII are intact, neurovascularly intact in all extremities with 2+ DTRs and 2+ pulses.  Lymph: No lymphadenopathy of posterior or anterior cervical chain or axillae bilaterally.  Gait normal with good balance and coordination.  MSK:  Non tender with full range of motion and good stability and symmetric strength and tone of shoulders, elbows, wrist, hip, knee and ankles bilaterally.   Patient does have degenerative scoliosis of the lumbar spine.  Significant tightness in all planes.  Tightness with hamstrings bilaterally but no true radicular symptoms.  Possible mild weakness of dorsiflexion of the left foot compared to the right today.  Patient seen tendon reflexes though are brisk possibly 3+  Osteopathic findings  T11 extended rotated and side bent left L1 flexed rotated and side bent right Sacrum right on right     Impression and Recommendations:      This case required medical decision making of moderate complexity. The above documentation has been reviewed and is accurate and complete Lyndal Pulley, DO       Note: This dictation was prepared with Dragon dictation along with smaller phrase technology. Any transcriptional errors that result from this process are unintentional.

## 2019-05-03 NOTE — Patient Instructions (Signed)
See me in 4 weeks 

## 2019-05-10 ENCOUNTER — Encounter: Payer: Self-pay | Admitting: Family Medicine

## 2019-05-12 ENCOUNTER — Other Ambulatory Visit: Payer: Self-pay

## 2019-05-12 ENCOUNTER — Ambulatory Visit
Admission: RE | Admit: 2019-05-12 | Discharge: 2019-05-12 | Disposition: A | Discharge status: Home / Self Care | Payer: Medicare PPO | Source: Ambulatory Visit | Attending: Family Medicine | Admitting: Family Medicine

## 2019-05-12 DIAGNOSIS — M47817 Spondylosis without myelopathy or radiculopathy, lumbosacral region: Secondary | ICD-10-CM | POA: Diagnosis not present

## 2019-05-12 DIAGNOSIS — M5416 Radiculopathy, lumbar region: Secondary | ICD-10-CM

## 2019-05-12 MED ORDER — IOPAMIDOL (ISOVUE-M 200) INJECTION 41%
1.0000 mL | Freq: Once | INTRAMUSCULAR | Status: AC
  Administered 2019-05-12: 1 mL via EPIDURAL

## 2019-05-12 MED ORDER — METHYLPREDNISOLONE ACETATE 40 MG/ML INJ SUSP (RADIOLOG
120.0000 mg | Freq: Once | INTRAMUSCULAR | Status: AC
  Administered 2019-05-12: 120 mg via EPIDURAL

## 2019-05-12 NOTE — Discharge Instructions (Signed)

## 2019-05-23 ENCOUNTER — Other Ambulatory Visit: Payer: Self-pay | Admitting: *Deleted

## 2019-05-23 ENCOUNTER — Ambulatory Visit: Payer: Medicare PPO | Admitting: Family Medicine

## 2019-05-23 ENCOUNTER — Encounter: Payer: Self-pay | Admitting: Family Medicine

## 2019-05-23 ENCOUNTER — Other Ambulatory Visit: Payer: Self-pay

## 2019-05-23 DIAGNOSIS — S39012A Strain of muscle, fascia and tendon of lower back, initial encounter: Secondary | ICD-10-CM | POA: Diagnosis not present

## 2019-05-23 MED ORDER — HYDROCHLOROTHIAZIDE 25 MG PO TABS: ORAL_TABLET | ORAL | 3 refills | Status: DC

## 2019-05-23 NOTE — Progress Notes (Signed)
PCP: [name], MD  Subjective:   HPI: Patient is a 85 y.o. male here for left lower back/hip pain. Patient has a history of spinal stenosis follow by [facility] where he receives OMM and had had epidural injections as well. He has been seen here in 2020 for L trochanteric bursitis versus glut medius tendonopathy in the past. His hip symptoms resolved with conservative treatment and PT.  He states that about 1 months ago he noted some return of his glut medius symptoms and he began doing his home exercises again. This has not interfered with his activity (has been able to walk and golf). However, yesterday in a further attempt to help his symptoms he did some more extensive stretching on a yoga mat. He states that he has had some pain in his left lower back since that time, he has tried some Voltaren that he had at home for his hand, but this did not help his back much. He had more pain this morning and felt he needed to use his cane to steady himself while walking (which is not usual for him).  No bowel/bladder dysfunction.  No numbness or radiation into legs.  Past Medical History:  Diagnosis Date  . Cataract   . GERD (gastroesophageal reflux disease)   . Hiatal hernia   . Hypertension     Current Outpatient Medications on File Prior to Visit  Medication Sig Dispense Refill  . allopurinol (ZYLOPRIM) 100 MG tablet Take 2 tablets (200 mg total) by mouth daily. 180 tablet 3  . aspirin 81 MG chewable tablet Chew 81 mg by mouth daily.      . Cholecalciferol (VITAMIN D3 PO) Take 4,000 Units by mouth daily.     . cloNIDine (CATAPRES - DOSED IN MG/24 HR) 0.3 mg/24hr patch APPLY 1 PATCH EVERY WEEK 12 patch 3  . Cyanocobalamin (VITAMIN B-12 PO) Take 1 capsule by mouth daily.    . diclofenac sodium (VOLTAREN) 1 % GEL Apply 2 g topically 4 (four) times daily. 150 g 1  . famotidine (PEPCID) 20 MG tablet Take 1 tablet (20 mg total) by mouth 2 (two) times daily. 60 tablet 3  . felodipine  (PLENDIL) 10 MG 24 hr tablet TAKE 1 TABLET(10 MG) BY MOUTH DAILY 90 tablet 3  . hydrochlorothiazide (HYDRODIURIL) 25 MG tablet Take one half tablet daily 90 tablet 3  . Multiple Vitamins-Minerals (ICAPS AREDS 2 PO) Take 1 capsule by mouth 2 (two) times a day.    Marland Kitchen MYRBETRIQ 25 MG TB24 tablet TAKE 1 TABLET BY MOUTH ONCE DAILY 90 tablet 3  . pantoprazole (PROTONIX) 40 MG tablet TAKE 1 TABLET BY MOUTH ONCE DAILY 90 tablet 1  . polyethylene glycol powder (GLYCOLAX/MIRALAX) powder TAKE 17GM(DISSOLVED IN WATER) BY MOUTH TWICE A DAY AT 10 PM AND 5 PM (Patient taking differently: TAKE 17GM(DISSOLVED IN WATER) BY MOUTH ONCE DAILY) 952 g 12  . pravastatin (PRAVACHOL) 40 MG tablet Take 1 tablet (40 mg total) by mouth daily. 90 tablet 3  . Pyridoxine HCl (VITAMIN B6 PO) Take 1 capsule by mouth daily.    . sildenafil (REVATIO) 20 MG tablet TAKE 1 TABLET BY MOUTH AS DIRECTED FOR ERECTILE DYSFUNCTION 30 tablet 6  . tizanidine (ZANAFLEX) 2 MG capsule Take 1 capsule (2 mg total) by mouth at bedtime. 30 capsule 0   No current facility-administered medications on file prior to visit.    Past Surgical History:  Procedure Laterality Date  . CATARACT EXTRACTION, BILATERAL    .  TOTAL KNEE ARTHROPLASTY     left  . TRANSURETHRAL RESECTION OF PROSTATE  2011    Allergies  Allergen Reactions  . Ace Inhibitors Swelling    After taking for long time had (facial) angioedema on lisinopril    Social History   Socioeconomic History  . Marital status: Legally Separated    Spouse name: Not on file  . Number of children: 0  . Years of education: Not on file  . Highest education level: Not on file  Occupational History  . Occupation: retired    Fish farm manager: [employer]: [employer]  Tobacco Use  . Smoking status: Former Smoker    Quit date: 04/28/1972    Years since quitting: 47.0  . Smokeless tobacco: Never Used  Substance and Sexual Activity  . Alcohol use: Yes     Alcohol/week: 7.0 standard drinks    Types: 7 Standard drinks or equivalent per week    Comment: 1 glass of wine a night  . Drug use: No  . Sexual activity: Yes  Other Topics Concern  . Not on file  Social History Narrative   Health Care POA:    Emergency Contact:    End of Life Plan:    Who lives with you: Lives by himself in 1 story home.    Any pets: none   Diet: Patient has a varied diet of protein, starch, and vegetables.  Is currently working with [facility] for nutrition.   Exercise: Patient golfs several times a week and does yoga at home 2x week.    Seatbelts: Patient reports wearing seat belt when in vehicle.   Nancy Fetter Exposure/Protection: Patient reports wearing sun screen intermittently.    Hobbies: golfing, teaching at [employer]      Social Determinants of Health   Financial Resource Strain:   . Difficulty of Paying Living Expenses: Not on file  Food Insecurity:   . Worried About Charity fundraiser in the Last Year: Not on file  . Ran Out of Food in the Last Year: Not on file  Transportation Needs:   . Lack of Transportation (Medical): Not on file  . Lack of Transportation (Non-Medical): Not on file  Physical Activity:   . Days of Exercise per Week: Not on file  . Minutes of Exercise per Session: Not on file  Stress:   . Feeling of Stress : Not on file  Social Connections:   . Frequency of Communication with Friends and Family: Not on file  . Frequency of Social Gatherings with Friends and Family: Not on file  . Attends Religious Services: Not on file  . Active Member of Clubs or Organizations: Not on file  . Attends Archivist Meetings: Not on file  . Marital Status: Not on file  Intimate Partner Violence:   . Fear of Current or Ex-Partner: Not on file  . Emotionally Abused: Not on file  . Physically Abused: Not on file  . Sexually Abused: Not on file    Family History  Problem Relation Age of Onset  . Diabetes Mother   . Kidney  disease Father   . Cancer Sister        ? pancreatic  . Heart disease Brother   . Cancer Sister        ? pancreatic  . Colon cancer Neg Hx   . Stomach cancer Neg Hx   . Rectal cancer Neg Hx   .  Esophageal cancer Neg Hx   . Liver cancer Neg Hx     BP (!) 149/74   Ht 5\' 9"  (1.753 m)   Wt 192 lb (87.1 kg)   BMI 28.35 kg/m   Review of Systems: See HPI above.     Objective:  Physical Exam:  Gen: awake, alert, NAD, comfortable in exam room Pulm: breathing unlabored  Lumbar spine: - Inspection: no gross deformity or asymmetry, swelling or ecchymosis. No skin changes - Palpation: TTP at left inferior paraspinal musculature. No TTP over the spinous processes or SI joints. - ROM: full active ROM of the lumbar spine in flexion and extension without pain, some pain with left rotation on lumbar spine. - Strength: 5/5 strength of lower extremity in L4-S1 nerve root distributions b/l - Neuro: sensation intact in the L4-S1 nerve root distribution b/l - Special testing: Negative straight leg raise  Hip:  - Inspection: No gross deformity, no swelling, erythema, or ecchymosis - Palpation: No TTP - ROM: Normal range of motion on Flexion abduction, internal and external rotation - Strength: 4+/5 Left lateral hip abduction. Otherwise normal strength. - Neuro/vasc: NV intact distally - Special Tests: Negative FABER and FADIR.  Negative logroll.  Negative Trendelenberg.   Assessment & Plan:  1. Left Lumbar Muscle Strain: Patient history and exam consistent with strain of his left lumbar paraspinal musculature. Will treat conservatively and have patient follow up in about 2 weeks if not improving. Will avoid NSAIDs and Muscle Relaxants considering patient's age and renal function. - Tylenol PRN - Voltaren gel PRN - Ice or Heat 3-4 times daily PRN - Remain active, avoid activities that hurt - Home exercises provided

## 2019-05-23 NOTE — Patient Instructions (Signed)
You have a lumbar strain. Ok to take tylenol for baseline pain relief (1-2 extra strength tabs 3x/day) Topical voltaren gel up to 4 times a day. Consider biofreeze topically as well up to 4 times a day. I would avoid taking aleve or ibuprofen.  Also avoid muscle relaxants given these can increase your risk of falls. Ice or heat, whichever feels better, 15 minutes at a time 3-4 times a day. Stay as active as possible. Do home exercises and stretches as directed - hold each for 20-30 seconds and do each one three times. Consider physical therapy if not improving as expected. Follow up with me in 2 weeks if you're not improving as expected.

## 2019-05-23 NOTE — Assessment & Plan Note (Signed)
Patient history and exam consistent with strain of his left lumbar paraspinal musculature. Will treat conservatively and have patient follow up in about 2 weeks if not improving. Will avoid NSAIDs and Muscle Relaxants considering patient's age and renal function. - Tylenol PRN - Voltaren gel PRN - Ice or Heat 3-4 times daily PRN - Remain active, avoid activities that hurt - Home exercises provided

## 2019-05-25 MED ORDER — BACLOFEN 10 MG PO TABS: 10.0000 mg | ORAL_TABLET | Freq: Three times a day (TID) | ORAL | 1 refills | Status: DC

## 2019-05-25 NOTE — Addendum Note (Signed)
Addended by: Lenda Kelp on: 05/25/2019 04:38 PM   Modules accepted: Orders

## 2019-05-30 ENCOUNTER — Other Ambulatory Visit: Payer: Self-pay

## 2019-05-31 ENCOUNTER — Ambulatory Visit: Payer: Medicare PPO | Admitting: Family Medicine

## 2019-05-31 ENCOUNTER — Encounter: Payer: Self-pay | Admitting: Family Medicine

## 2019-05-31 ENCOUNTER — Other Ambulatory Visit: Payer: Self-pay

## 2019-05-31 VITALS — BP 130/70 | HR 62 | Ht 69.0 in | Wt 190.0 lb

## 2019-05-31 DIAGNOSIS — M999 Biomechanical lesion, unspecified: Secondary | ICD-10-CM | POA: Diagnosis not present

## 2019-05-31 DIAGNOSIS — M48061 Spinal stenosis, lumbar region without neurogenic claudication: Secondary | ICD-10-CM | POA: Diagnosis not present

## 2019-05-31 DIAGNOSIS — M5416 Radiculopathy, lumbar region: Secondary | ICD-10-CM | POA: Diagnosis not present

## 2019-05-31 MED ORDER — POLYETHYLENE GLYCOL 3350 17 GM/SCOOP PO POWD: ORAL | 12 refills | Status: DC

## 2019-05-31 NOTE — Assessment & Plan Note (Signed)
Spinal stenosis.  Discussed with patient about icing regimen, home exercise, which activities to do which wants to avoid.  Patient should increase activity slowly over the course of neck several weeks.  Follow-up again in 4 to 8 weeks

## 2019-05-31 NOTE — Patient Instructions (Addendum)
Good to see you Tylenol up to 3 times a day Keep monitoring the hip See me again in 4-6 weeks

## 2019-05-31 NOTE — Progress Notes (Signed)
[place] [address] [place] Phone: [phone] Subjective:   I Paul [name] am serving as a Education administrator for Dr. Hulan Saas.  This visit occurred during the SARS-CoV-2 public health emergency.  Safety protocols were in place, including screening questions prior to the visit, additional usage of staff PPE, and extensive cleaning of exam room while observing appropriate contact time as indicated for disinfecting solutions.   I'm seeing this patient by the request  of:  [name], MD  CC: Low back pain follow-up  [id number]  Paul [name] is a 85 y.o. male coming in with complaint of back pain. Last seen on 05/03/2019 for OMT. Patient seen by another provider for lumbar strain on 05/23/2019. Patient states his back has been doing ok.  Reviewing patient's chart and agreed patient and her name patient was seen by another sports medicine provider and diagnosed with a strain of the paraspinal musculature.  Patient states that did topical anti-inflammatories and Tylenol scheduled has been improvement over the last week.  Still some mild discomfort but nothing as severe as what it was initially.  Known severe spinal stenosis.     Past Medical History:  Diagnosis Date  . Cataract   . GERD (gastroesophageal reflux disease)   . Hiatal hernia   . Hypertension    Past Surgical History:  Procedure Laterality Date  . CATARACT EXTRACTION, BILATERAL    . TOTAL KNEE ARTHROPLASTY     left  . TRANSURETHRAL RESECTION OF PROSTATE  2011   Social History   Socioeconomic History  . Marital status: Legally Separated    Spouse name: Not on file  . Number of children: 0  . Years of education: Not on file  . Highest education level: Not on file  Occupational History  . 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 Smoking status: Former Smoker    Quit date: 04/28/1972   Years since quitting: 47.1  . Smokeless tobacco: Never Used  Substance and Sexual Activity  . Alcohol use: Yes    Alcohol/week: 7.0 standard drinks    Types: 7 Standard drinks or equivalent per week    Comment: 1 glass of wine a night  . Drug use: No  . Sexual activity: Yes  Other Topics Concern  . Not on file  Social History Narrative   Health Care POA:    Emergency Contact:    End of Life Plan:    Who lives with you: Lives by himself in 1 story home.    Any pets: none   Diet: Patient has a varied diet of protein, starch, and vegetables.  Is currently working with [facility] for nutrition.   Exercise: Patient golfs several times a week and does yoga at home 2x week.    Seatbelts: Patient reports wearing seat belt when in vehicle.   Nancy Fetter Exposure/Protection: Patient reports wearing sun screen intermittently.    Hobbies: golfing, teaching at [employer]      Social Determinants of Health   Financial Resource Strain:   . Difficulty of Paying Living Expenses: Not on file  Food Insecurity:   . Worried About Charity fundraiser in the Last Year: Not on file  . Ran Out of Food in the Last Year: Not on file  Transportation Needs:   . Lack of Transportation (Medical): Not on file  .  Lack of Transportation (Non-Medical): Not on file  Physical Activity:   . Days of Exercise per Week: Not on file  . Minutes of Exercise per Session: Not on file  Stress:   . Feeling of Stress : Not on file  Social Connections:   . Frequency of Communication with Friends and Family: Not on file  . Frequency of Social Gatherings with Friends and Family: Not on file  . Attends Religious Services: Not on file  . Active Member of Clubs or Organizations: Not on file  . Attends Banker Meetings: Not on file  . Marital Status: Not on file   Allergies  Allergen Reactions  . Ace Inhibitors Swelling    After taking for long time had (facial) angioedema on lisinopril   Family  History  Problem Relation Age of Onset  . Diabetes Mother   . Kidney disease Father   . Cancer Sister        ? pancreatic  . Heart disease Brother   . Cancer Sister        ? pancreatic  . Colon cancer Neg Hx   . Stomach cancer Neg Hx   . Rectal cancer Neg Hx   . Esophageal cancer Neg Hx   . Liver cancer Neg Hx      Current Outpatient Medications (Cardiovascular):  .  cloNIDine (CATAPRES - DOSED IN MG/24 HR) 0.3 mg/24hr patch, APPLY 1 PATCH EVERY WEEK .  felodipine (PLENDIL) 10 MG 24 hr tablet, TAKE 1 TABLET(10 MG) BY MOUTH DAILY .  hydrochlorothiazide (HYDRODIURIL) 25 MG tablet, Take one half tablet daily .  pravastatin (PRAVACHOL) 40 MG tablet, Take 1 tablet (40 mg total) by mouth daily. .  sildenafil (REVATIO) 20 MG tablet, TAKE 1 TABLET BY MOUTH AS DIRECTED FOR ERECTILE DYSFUNCTION   Current Outpatient Medications (Analgesics):  .  allopurinol (ZYLOPRIM) 100 MG tablet, Take 2 tablets (200 mg total) by mouth daily. Marland Kitchen  aspirin 81 MG chewable tablet, Chew 81 mg by mouth daily.    Current Outpatient Medications (Hematological):  Marland Kitchen  Cyanocobalamin (VITAMIN B-12 PO), Take 1 capsule by mouth daily.  Current Outpatient Medications (Other):  .  baclofen (LIORESAL) 10 MG tablet, Take 1 tablet (10 mg total) by mouth 3 (three) times daily. .  Cholecalciferol (VITAMIN D3 PO), Take 4,000 Units by mouth daily.  .  diclofenac sodium (VOLTAREN) 1 % GEL, Apply 2 g topically 4 (four) times daily. .  famotidine (PEPCID) 20 MG tablet, Take 1 tablet (20 mg total) by mouth 2 (two) times daily. .  Multiple Vitamins-Minerals (ICAPS AREDS 2 PO), Take 1 capsule by mouth 2 (two) times a day. Marland Kitchen  MYRBETRIQ 25 MG TB24 tablet, TAKE 1 TABLET BY MOUTH ONCE DAILY .  pantoprazole (PROTONIX) 40 MG tablet, TAKE 1 TABLET BY MOUTH ONCE DAILY .  polyethylene glycol powder (GLYCOLAX/MIRALAX) 17 GM/SCOOP powder, TAKE 17GM(DISSOLVED IN WATER) BY MOUTH ONCE DAILY .  Pyridoxine HCl (VITAMIN B6 PO), Take 1 capsule by  mouth daily.   Reviewed prior external information including notes and imaging from  primary care provider As well as notes that were available from care everywhere and other healthcare systems.  Past medical history, social, surgical and family history all reviewed in electronic medical record.  No pertanent information unless stated regarding to the chief complaint.   Review of Systems:  No headache, visual changes, nausea, vomiting, diarrhea, constipation, dizziness, abdominal pain, skin rash, fevers, chills, night sweats, weight loss, swollen lymph nodes, body aches, joint  swelling, chest pain, shortness of breath, mood changes. POSITIVE muscle aches  Objective  Blood pressure 130/70, pulse 62, height 5\' 9"  (1.753 m), weight 190 lb (86.2 kg), SpO2 94 %.   General: No apparent distress alert and oriented x3 mood and affect normal, dressed appropriately.  HEENT: Pupils equal, extraocular movements intact  Respiratory: Patient's speak in full sentences and does not appear short of breath  Cardiovascular: No lower extremity edema, non tender, no erythema  Skin: Warm dry intact with no signs of infection or rash on extremities or on axial skeleton.  Abdomen: Soft nontender  Neuro: Cranial nerves II through XII are intact, neurovascularly intact in all extremities with 2+ DTRs and 2+ pulses.  Lymph: No lymphadenopathy of posterior or anterior cervical chain or axillae bilaterally.  Gait normal with good balance and coordination.  MSK:  Non tender with full range of motion and good stability and symmetric strength and tone of shoulders, elbows, wrist, hip, knee and ankles bilaterally.  Moderate arthritic changes of multiple joints Low back exam severe loss of lordosis with degenerative scoliosis noted.  Tenderness to palpation in the paraspinal musculature on the left side.  Significant tightness with hamstrings bilaterally but no radicular symptoms.  Patient has very limited extension of the  back of 5 degrees.  Some mild pain over the left gluteal region as well  Osteopathic findings  T9 extended rotated and side bent left L2 flexed rotated and side bent right L4 flexed rotated and side bent left Sacrum left on left   Impression and Recommendations:     This case required medical decision making of moderate complexity. The above documentation has been reviewed and is accurate and complete , DO       Note: This dictation was prepared with Dragon dictation along with smaller phrase technology. Any transcriptional errors that result from this process are unintentional.

## 2019-05-31 NOTE — Assessment & Plan Note (Signed)
Decision today to treat with OMT was based on Physical Exam  After verbal consent patient was treated with HVLA, ME, FPR techniques in  thoracic, lumbar and sacral areas  Patient tolerated the procedure well with improvement in symptoms  Patient given exercises, stretches and lifestyle modifications  See medications in patient instructions if given  Patient will follow up in 8 weeks 

## 2019-06-21 ENCOUNTER — Other Ambulatory Visit: Payer: Self-pay | Admitting: Family Medicine

## 2019-07-04 ENCOUNTER — Other Ambulatory Visit: Payer: Self-pay

## 2019-07-04 DIAGNOSIS — K2100 Gastro-esophageal reflux disease with esophagitis, without bleeding: Secondary | ICD-10-CM

## 2019-07-04 MED ORDER — PANTOPRAZOLE SODIUM 40 MG PO TBEC: 40.0000 mg | DELAYED_RELEASE_TABLET | Freq: Two times a day (BID) | ORAL | 3 refills | Status: DC

## 2019-07-04 NOTE — Telephone Encounter (Signed)
He has a history of reflux esophagitis in the setting of a rather large hiatal hernia This hiatal hernia is the predominant cause for his reflux given that the natural barrier between the esophagus and stomach is not in place Given persistent symptoms I would suggest he change to pantoprazole 40 mg twice daily before first and last meal of the day This may be better than PPI in the morning and H2 blocker in the evening He should give this a try for 2 to 4 weeks and then call me with a symptom update  Please check CMP and magnesium level to ensure normal in the setting of chronic PPI Thanks

## 2019-07-05 ENCOUNTER — Ambulatory Visit: Payer: Medicare PPO | Admitting: Family Medicine

## 2019-07-07 ENCOUNTER — Ambulatory Visit: Payer: Medicare PPO | Admitting: Family Medicine

## 2019-07-18 ENCOUNTER — Ambulatory Visit: Payer: Medicare PPO | Admitting: Family Medicine

## 2019-07-18 ENCOUNTER — Other Ambulatory Visit: Payer: Self-pay

## 2019-07-18 ENCOUNTER — Encounter: Payer: Self-pay | Admitting: Family Medicine

## 2019-07-18 DIAGNOSIS — M5416 Radiculopathy, lumbar region: Secondary | ICD-10-CM | POA: Diagnosis not present

## 2019-07-18 DIAGNOSIS — M48061 Spinal stenosis, lumbar region without neurogenic claudication: Secondary | ICD-10-CM

## 2019-07-18 DIAGNOSIS — M999 Biomechanical lesion, unspecified: Secondary | ICD-10-CM

## 2019-07-18 NOTE — Progress Notes (Signed)
[name] [address] [place] [address] Phone: [phone] Subjective:   Paul [name], am serving as a scribe for Dr. Antoine Primas. This visit occurred during the SARS-CoV-2 public health emergency.  Safety protocols were in place, including screening questions prior to the visit, additional usage of staff PPE, and extensive cleaning of exam room while observing appropriate contact time as indicated for disinfecting solutions.   I'm seeing this patient by the request  of:  [name], MD  CC:  Back pain   [id number]   Paul [name] is a 85 y.o. male coming in with complaint of back pain. Last seen on 05/31/2019 for OMT. Patient states that he has been working out more. Back pain is unchanged.  Low back still tight, no radiation, no numbness.     Past Medical History:  Diagnosis Date  . Cataract   . GERD (gastroesophageal reflux disease)   . Hiatal hernia   . Hypertension    Past Surgical History:  Procedure Laterality Date  . CATARACT EXTRACTION, BILATERAL    . TOTAL KNEE ARTHROPLASTY     left  . TRANSURETHRAL RESECTION OF PROSTATE  2011   Social History   Socioeconomic History  . Marital status: Legally Separated    Spouse name: Not on file  . Number of children: 0  . Years of education: Not on file  . Highest education level: Not on file  Occupational History  . 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 Smoking status: Former Smoker    Quit date: 04/28/1972    Years since quitting: 47.2  . Smokeless tobacco: Never Used  Substance and Sexual Activity  . Alcohol use: Yes    Alcohol/week: 7.0 standard drinks    Types: 7 Standard drinks or equivalent per week    Comment: 1 glass of wine a night  . Drug use: No  . Sexual activity: Yes  Other Topics Concern  . Not on file  Social History Narrative   Health Care POA:    Emergency Contact:      End of Life Plan:    Who lives with you: Lives by himself in 1 story home.    Any pets: none   Diet: Patient has a varied diet of protein, starch, and vegetables.  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   Exercise: Patient golfs several times a week and does yoga at home 2x week.    Seatbelts: Patient reports wearing seat belt when in vehicle.   [name] Exposure/Protection: Patient reports wearing sun screen intermittently.    Hobbies: golfing, teaching at [employer]      Social Determinants of Health   Financial Resource Strain:   . Difficulty of Paying Living Expenses:   Food Insecurity:   . Worried About Programme researcher, broadcasting/film/video in the Last Year:   . Barista in the Last Year:   Transportation Needs:   . Freight forwarder (Medical):   Marland Kitchen Lack of Transportation (Non-Medical):   Physical Activity:   . Days of Exercise per Week:   . Minutes of Exercise per Session:   Stress:   . Feeling of Stress :   Social Connections:   . Frequency of Communication with Friends and Family:   . Frequency of Social Gatherings with Friends and Family:   . Attends Religious  Services:   . Active Member of Clubs or Organizations:   . Attends Banker Meetings:   Marland Kitchen Marital Status:    Allergies  Allergen Reactions  . Ace Inhibitors Swelling    After taking for long time had (facial) angioedema on lisinopril   Family History  Problem Relation Age of Onset  . Diabetes Mother   . Kidney disease Father   . Cancer Sister        ? pancreatic  . Heart disease Brother   . Cancer Sister        ? pancreatic  . Colon cancer Neg Hx   . Stomach cancer Neg Hx   . Rectal cancer Neg Hx   . Esophageal cancer Neg Hx   . Liver cancer Neg Hx      Current Outpatient Medications (Cardiovascular):  .  cloNIDine (CATAPRES - DOSED IN MG/24 HR) 0.3 mg/24hr patch, APPLY 1 PATCH EVERY WEEK .  felodipine (PLENDIL) 10 MG 24 hr tablet, TAKE 1 TABLET(10 MG) BY MOUTH  DAILY .  hydrochlorothiazide (HYDRODIURIL) 25 MG tablet, Take one half tablet daily .  pravastatin (PRAVACHOL) 40 MG tablet, Take 1 tablet (40 mg total) by mouth daily. .  sildenafil (REVATIO) 20 MG tablet, take 1 tablet by mouth as directed for erectile dysfunction   Current Outpatient Medications (Analgesics):  .  allopurinol (ZYLOPRIM) 100 MG tablet, Take 2 tablets (200 mg total) by mouth daily. Marland Kitchen  aspirin 81 MG chewable tablet, Chew 81 mg by mouth daily.    Current Outpatient Medications (Hematological):  Marland Kitchen  Cyanocobalamin (VITAMIN B-12 PO), Take 1 capsule by mouth daily.  Current Outpatient Medications (Other):  Marland Kitchen  Cholecalciferol (VITAMIN D3 PO), Take 4,000 Units by mouth daily.  .  diclofenac sodium (VOLTAREN) 1 % GEL, Apply 2 g topically 4 (four) times daily. .  famotidine (PEPCID) 20 MG tablet, Take 1 tablet (20 mg total) by mouth 2 (two) times daily. .  Multiple Vitamins-Minerals (ICAPS AREDS 2 PO), Take 1 capsule by mouth 2 (two) times a day. Marland Kitchen  MYRBETRIQ 25 MG TB24 tablet, TAKE 1 TABLET BY MOUTH ONCE DAILY .  pantoprazole (PROTONIX) 40 MG tablet, TAKE 1 TABLET BY MOUTH ONCE DAILY .  pantoprazole (PROTONIX) 40 MG tablet, Take 1 tablet (40 mg total) by mouth 2 (two) times daily before a meal. .  polyethylene glycol powder (GLYCOLAX/MIRALAX) 17 GM/SCOOP powder, TAKE 17GM(DISSOLVED IN WATER) BY MOUTH ONCE DAILY .  Pyridoxine HCl (VITAMIN B6 PO), Take 1 capsule by mouth daily. .  baclofen (LIORESAL) 10 MG tablet, Take 1 tablet (10 mg total) by mouth 3 (three) times daily.   Reviewed prior external information including notes and imaging from  primary care provider As well as notes that were available from care everywhere and other healthcare systems.  Past medical history, social, surgical and family history all reviewed in electronic medical record.  No pertanent information unless stated regarding to the chief complaint.   Review of Systems:  No headache, visual changes,  nausea, vomiting, diarrhea, constipation, dizziness, abdominal pain, skin rash, fevers, chills, night sweats, weight loss, swollen lymph nodes, body aches, joint swelling, chest pain, shortness of breath, mood changes. POSITIVE muscle aches  Objective  Blood pressure 120/78, pulse (!) 50, height 5\' 9"  (1.753 m), weight 189 lb (85.7 kg), SpO2 99 %.   General: No apparent distress alert and oriented x3 mood and affect normal, dressed appropriately.  HEENT: Pupils equal, extraocular movements intact  Respiratory: Patient's speak in full  sentences and does not appear short of breath  Cardiovascular: No lower extremity edema, non tender, no erythema  Neuro: Cranial nerves II through XII are intact, neurovascularly intact in all extremities with 2+ DTRs and 2+ pulses.  Gait normal with good balance and coordination.  MSK:  tender with full range of motion and good stability and symmetric strength and tone of shoulders, elbows, wrist, hip, knee and ankles bilaterally.  Low back exam does have some loss of lordosis.  Tender to palpation in the paraspinal musculature lumbar spine right greater than left.  Osteopathic findings  T9 extended rotated and side bent left L2 flexed rotated and side bent right L4 flexed rotated and side bent left Sacrum right on right    Impression and Recommendations:     This case required medical decision making of moderate complexity. The above documentation has been reviewed and is accurate and complete Lyndal Pulley, DO       Note: This dictation was prepared with Dragon dictation along with smaller phrase technology. Any transcriptional errors that result from this process are unintentional.

## 2019-07-18 NOTE — Assessment & Plan Note (Signed)

## 2019-07-18 NOTE — Assessment & Plan Note (Signed)
Chronic problem : mild exacerbation   interventions previously, including medication management: injections, epidurals, gabapentin , continue OTC meds    Interventions this visit: Manipulation We discussed with patient the importance ergonomics, home exercises, icing regimen, and over-the-counter natural products.   Future considerations but will be based on evaluation and next visit: Continued manipulation likely, consider neuromodulator medications    Return to clinic: 4 weeks

## 2019-07-18 NOTE — Patient Instructions (Signed)
Go [place] next year Keep working on hip flexors See me in 4-6 weeks

## 2019-07-20 ENCOUNTER — Encounter: Payer: Self-pay | Admitting: Family Medicine

## 2019-07-20 ENCOUNTER — Other Ambulatory Visit: Payer: Self-pay

## 2019-07-20 ENCOUNTER — Ambulatory Visit (INDEPENDENT_AMBULATORY_CARE_PROVIDER_SITE_OTHER): Payer: Medicare PPO | Admitting: Family Medicine

## 2019-07-20 VITALS — BP 126/72 | HR 88 | Ht 69.0 in | Wt 186.8 lb

## 2019-07-20 DIAGNOSIS — Z125 Encounter for screening for malignant neoplasm of prostate: Secondary | ICD-10-CM | POA: Diagnosis not present

## 2019-07-20 DIAGNOSIS — E785 Hyperlipidemia, unspecified: Secondary | ICD-10-CM | POA: Diagnosis not present

## 2019-07-20 DIAGNOSIS — I10 Essential (primary) hypertension: Secondary | ICD-10-CM | POA: Diagnosis not present

## 2019-07-20 DIAGNOSIS — M25551 Pain in right hip: Secondary | ICD-10-CM

## 2019-07-20 MED ORDER — SILDENAFIL CITRATE 20 MG PO TABS: ORAL_TABLET | ORAL | 0 refills | Status: DC

## 2019-07-20 NOTE — Patient Instructions (Signed)
Great to see you!   

## 2019-07-21 LAB — MAGNESIUM: Magnesium: 2.6 mg/dL — ABNORMAL HIGH (ref 1.6–2.3)

## 2019-07-21 LAB — COMPREHENSIVE METABOLIC PANEL
ALT: 21 IU/L (ref 0–44)
AST: 33 IU/L (ref 0–40)
Albumin/Globulin Ratio: 1.3 (ref 1.2–2.2)
Albumin: 4.4 g/dL (ref 3.6–4.6)
Alkaline Phosphatase: 63 IU/L (ref 39–117)
BUN/Creatinine Ratio: 15 (ref 10–24)
BUN: 28 mg/dL — ABNORMAL HIGH (ref 8–27)
Bilirubin Total: 0.4 mg/dL (ref 0.0–1.2)
CO2: 24 mmol/L (ref 20–29)
Calcium: 9.9 mg/dL (ref 8.6–10.2)
Chloride: 96 mmol/L (ref 96–106)
Creatinine, Ser: 1.84 mg/dL — ABNORMAL HIGH (ref 0.76–1.27)
GFR calc Af Amer: 39 mL/min/{1.73_m2} — ABNORMAL LOW (ref >59)
GFR calc non Af Amer: 33 mL/min/{1.73_m2} — ABNORMAL LOW (ref >59)
Globulin, Total: 3.3 g/dL (ref 1.5–4.5)
Glucose: 113 mg/dL — ABNORMAL HIGH (ref 65–99)
Potassium: 3.5 mmol/L (ref 3.5–5.2)
Sodium: 140 mmol/L (ref 134–144)
Total Protein: 7.7 g/dL (ref 6.0–8.5)

## 2019-07-21 LAB — PSA: Prostate Specific Ag, Serum: 4.1 ng/mL — ABNORMAL HIGH (ref 0.0–4.0)

## 2019-07-21 LAB — LDL CHOLESTEROL, DIRECT: LDL Direct: 131 mg/dL — ABNORMAL HIGH (ref 0–99)

## 2019-07-22 NOTE — Assessment & Plan Note (Signed)
Continue current medications. 

## 2019-07-22 NOTE — Progress Notes (Signed)
    CHIEF COMPLAINT / HPI: #1 right hip and groin pain for the last 4 to 5 days.  Feels musculoskeletal.  Hurts with certain movements.  He is worried because he is supposed to play golf tomorrow and this seems to aggravate it.  No giving way.  No numbness #2.  Hypertension: Taking medicines without problems #3.  Hyperlipidemia: Taking medicines without problems. #4.  Here for screening blood work. #5.  Was having increasing problems with reflux so his gastroenterologist put him on twice daily proton pump inhibitor.  He is doing better on that.   PERTINENT  PMH / PSH: I have reviewed the patient's medications, allergies, past medical and surgical history, smoking status and updated in the EMR as appropriate.   OBJECTIVE:  BP 126/72   Pulse 88   Ht 5\' 9"  (1.753 m)   Wt 186 lb 12.8 oz (84.7 kg)   SpO2 98%   BMI 27.59 kg/m  GENERAL: Well-developed male no acute distress HIPS: Right hip pain with external rotation.  Normal flexor and extensor strength.  Next GAIT: Slightly antalgic.  Forward flexed trunk posture. Neuro: Distally neurovascularly intact with normal strength bilateral lower extremities.  ASSESSMENT / PLAN: #1.  Abductor strain of the right hip.  Discussed.  I think this will resolve on its own. #3.  Check PSA and other blood work  Hyperlipidemia Continue current medications.  HYPERTENSION, BENIGN SYSTEMIC Continue current medications.   MD

## 2019-07-25 ENCOUNTER — Encounter: Payer: Self-pay | Admitting: Family Medicine

## 2019-07-27 NOTE — Telephone Encounter (Signed)
Let patient know that Mg levels can be low with PPI.  His is slightly high, thus PPI is not adversely affecting his Mg.  I would not take Mg supplement given slightly high level Continue current GERD therapy

## 2019-08-02 ENCOUNTER — Encounter: Payer: Self-pay | Admitting: Family Medicine

## 2019-08-03 ENCOUNTER — Other Ambulatory Visit: Payer: Self-pay

## 2019-08-04 MED ORDER — FELODIPINE ER 10 MG PO TB24: ORAL_TABLET | ORAL | 3 refills | Status: DC

## 2019-08-23 ENCOUNTER — Ambulatory Visit: Payer: Medicare PPO | Admitting: Family Medicine

## 2019-08-27 ENCOUNTER — Other Ambulatory Visit: Payer: Self-pay | Admitting: Family Medicine

## 2019-08-31 ENCOUNTER — Other Ambulatory Visit: Payer: Self-pay

## 2019-08-31 MED ORDER — MIRABEGRON ER 25 MG PO TB24: 25.0000 mg | ORAL_TABLET | Freq: Every day | ORAL | 3 refills | Status: DC

## 2019-09-12 ENCOUNTER — Other Ambulatory Visit: Payer: Self-pay

## 2019-09-12 ENCOUNTER — Encounter: Payer: Self-pay | Admitting: Family Medicine

## 2019-09-12 ENCOUNTER — Ambulatory Visit: Payer: Medicare PPO | Admitting: Family Medicine

## 2019-09-12 VITALS — BP 112/70 | HR 58 | Ht 69.0 in | Wt 189.0 lb

## 2019-09-12 DIAGNOSIS — M999 Biomechanical lesion, unspecified: Secondary | ICD-10-CM

## 2019-09-12 DIAGNOSIS — M48061 Spinal stenosis, lumbar region without neurogenic claudication: Secondary | ICD-10-CM

## 2019-09-12 DIAGNOSIS — M5416 Radiculopathy, lumbar region: Secondary | ICD-10-CM | POA: Diagnosis not present

## 2019-09-12 NOTE — Assessment & Plan Note (Signed)
Known and severe.  Has responded to epidurals previously.  Chronic problem with exacerbation.  Does respond well to above manipulation.  Discussed medications including topical anti-inflammatories as well as allopurinol that has been beneficial in this individual.  We have tried gabapentin in the past with mild relief.  Increase activity slowly.  Follow-up again in 4 to 8 weeks

## 2019-09-12 NOTE — Progress Notes (Signed)
[facility] [address] [place] Phone: [phone] Subjective:   I [name] am serving as a Education administrator for Dr. Hulan Saas.  This visit occurred during the SARS-CoV-2 public health emergency.  Safety protocols were in place, including screening questions prior to the visit, additional usage of staff PPE, and extensive cleaning of exam room while observing appropriate contact time as indicated for disinfecting solutions.   I'm seeing this patient by the request  of:  [name], MD  CC: Back pain follow-up  [id number]  Paul [name] is a 85 y.o. male coming in with complaint of back pain. Last seen for OMT. States he is painful today. Bilateral wrist pain today. Dog was pulling on the leash and he believes that is what caused the pain.  Patient is wondering if certain orthotics could be more beneficial for his back when he has playing golf.  Patient states sometimes it seems to be somewhat difficult does have some leg pain associated with it.     Past Medical History:  Diagnosis Date  . Cataract   . GERD (gastroesophageal reflux disease)   . Hiatal hernia   . Hypertension    Past Surgical History:  Procedure Laterality Date  . CATARACT EXTRACTION, BILATERAL    . TOTAL KNEE ARTHROPLASTY     left  . TRANSURETHRAL RESECTION OF PROSTATE  2011   Social History   Socioeconomic History  . Marital status: Legally Separated    Spouse name: Not on file  . Number of children: 0  . Years of education: Not on file  . Highest education level: Not on file  Occupational History  . Occupation: retired    Fish farm manager: [employer]: [place]  Tobacco Use  . Smoking status: Former Smoker    Quit date: 04/28/1972    Years since quitting: 47.4  . Smokeless tobacco: Never Used  Substance and Sexual Activity  . Alcohol use: Yes    Alcohol/week: 7.0 standard drinks    Types: 7 Standard  drinks or equivalent per week    Comment: 1 glass of wine a night  . Drug use: No  . Sexual activity: Yes  Other Topics Concern  . Not on file  Social History Narrative   Health Care POA:    Emergency Contact:    End of Life Plan:    Who lives with you: Lives by himself in 1 story home.    Any pets: none   Diet: Patient has a varied diet of protein, starch, and vegetables.  Is currently working with [facility] for nutrition.   Exercise: Patient golfs several times a week and does yoga at home 2x week.    Seatbelts: Patient reports wearing seat belt when in vehicle.   Nancy Fetter Exposure/Protection: Patient reports wearing sun screen intermittently.    Hobbies: golfing, teaching at [employer]      Social Determinants of Health   Financial Resource Strain:   . Difficulty of Paying Living Expenses:   Food Insecurity:   . Worried About Charity fundraiser in the Last Year:   . Arboriculturist in the Last Year:   Transportation Needs:   . Film/video editor (Medical):   Marland Kitchen Lack of Transportation (Non-Medical):   Physical Activity:   . Days of Exercise per Week:   . Minutes of Exercise per Session:   Stress:   .  Feeling of Stress :   Social Connections:   . Frequency of Communication with Friends and Family:   . Frequency of Social Gatherings with Friends and Family:   . Attends Religious Services:   . Active Member of Clubs or Organizations:   . Attends Banker Meetings:   Marland Kitchen Marital Status:    Allergies  Allergen Reactions  . Ace Inhibitors Swelling    After taking for long time had (facial) angioedema on lisinopril   Family History  Problem Relation Age of Onset  . Diabetes Mother   . Kidney disease Father   . Cancer Sister        ? pancreatic  . Heart disease Brother   . Cancer Sister        ? pancreatic  . Colon cancer Neg Hx   . Stomach cancer Neg Hx   . Rectal cancer Neg Hx   . Esophageal cancer Neg Hx   . Liver cancer Neg Hx       Current Outpatient Medications (Cardiovascular):  .  cloNIDine (CATAPRES - DOSED IN MG/24 HR) 0.3 mg/24hr patch, APPLY 1 PATCH EVERY WEEK .  felodipine (PLENDIL) 10 MG 24 hr tablet, TAKE 1 TABLET(10 MG) BY MOUTH DAILY .  hydrochlorothiazide (HYDRODIURIL) 25 MG tablet, Take one half tablet daily .  pravastatin (PRAVACHOL) 40 MG tablet, Take 1 tablet (40 mg total) by mouth daily. .  sildenafil (REVATIO) 20 MG tablet, take 1 tablet by mouth as directed for erectile dysfunction   Current Outpatient Medications (Analgesics):  .  allopurinol (ZYLOPRIM) 100 MG tablet, Take 2 tablets (200 mg total) by mouth daily. Marland Kitchen  aspirin 81 MG chewable tablet, Chew 81 mg by mouth daily.    Current Outpatient Medications (Hematological):  Marland Kitchen  Cyanocobalamin (VITAMIN B-12 PO), Take 1 capsule by mouth daily.  Current Outpatient Medications (Other):  Marland Kitchen  Cholecalciferol (VITAMIN D3 PO), Take 4,000 Units by mouth daily.  .  diclofenac sodium (VOLTAREN) 1 % GEL, Apply 2 g topically 4 (four) times daily. .  mirabegron ER (MYRBETRIQ) 25 MG TB24 tablet, Take 1 tablet (25 mg total) by mouth daily. .  Multiple Vitamins-Minerals (ICAPS AREDS 2 PO), Take 1 capsule by mouth 2 (two) times a day. .  pantoprazole (PROTONIX) 40 MG tablet, Take 1 tablet (40 mg total) by mouth 2 (two) times daily before a meal. .  polyethylene glycol powder (GLYCOLAX/MIRALAX) 17 GM/SCOOP powder, TAKE 17GM(DISSOLVED IN WATER) BY MOUTH ONCE DAILY .  Pyridoxine HCl (VITAMIN B6 PO), Take 1 capsule by mouth daily.   Reviewed prior external information including notes and imaging from  primary care provider As well as notes that were available from care everywhere and other healthcare systems.  Past medical history, social, surgical and family history all reviewed in electronic medical record.  No pertanent information unless stated regarding to the chief complaint.   Review of Systems:  No headache, visual changes, nausea, vomiting,  diarrhea, constipation, dizziness, abdominal pain, skin rash, fevers, chills, night sweats, weight loss, swollen lymph nodes,  joint swelling, chest pain, shortness of breath, mood changes. POSITIVE muscle aches, body aches  Objective  Blood pressure 112/70, pulse (!) 58, height 5\' 9"  (1.753 m), weight 189 lb (85.7 kg), SpO2 98 %.   General: No apparent distress alert and oriented x3 mood and affect normal, dressed appropriately.  HEENT: Pupils equal, extraocular movements intact  Respiratory: Patient's speak in full sentences and does not appear short of breath  Cardiovascular: No lower extremity  edema, non tender, no erythema  Neuro: Cranial nerves II through XII are intact, neurovascularly intact in all extremities with 2+ DTRs and 2+ pulses.  Gait normal with good balance and coordination.  MSK: Arthritic changes of multiple joints Patient's low back does have some loss of lordosis.  Tenderness to palpation of the paraspinal musculature lumbar spine right greater than left.  Patient does have some tightness with straight leg test.  Tightness with FABER test.  But not severe.  Neurovascularly intact distally with 5 out of 5 strength  Osteopathic findings  T5 extended rotated and side bent left L4 flexed rotated and side bent left Sacrum right on right     Impression and Recommendations:     This case required medical decision making of moderate complexity. The above documentation has been reviewed and is accurate and complete [name], DO       Note: This dictation was prepared with Dragon dictation along with smaller phrase technology. Any transcriptional errors that result from this process are unintentional.

## 2019-09-12 NOTE — Patient Instructions (Signed)
Thigh compression sleeve with golf See me again in 4-5 weeks

## 2019-09-12 NOTE — Assessment & Plan Note (Signed)

## 2019-10-11 ENCOUNTER — Encounter: Payer: Self-pay | Admitting: Family Medicine

## 2019-10-11 ENCOUNTER — Other Ambulatory Visit: Payer: Self-pay

## 2019-10-11 ENCOUNTER — Ambulatory Visit: Payer: Medicare PPO | Admitting: Family Medicine

## 2019-10-11 VITALS — BP 130/82 | Ht 69.0 in | Wt 185.0 lb

## 2019-10-11 DIAGNOSIS — M5416 Radiculopathy, lumbar region: Secondary | ICD-10-CM | POA: Diagnosis not present

## 2019-10-11 DIAGNOSIS — M999 Biomechanical lesion, unspecified: Secondary | ICD-10-CM | POA: Diagnosis not present

## 2019-10-11 DIAGNOSIS — M48061 Spinal stenosis, lumbar region without neurogenic claudication: Secondary | ICD-10-CM

## 2019-10-11 NOTE — Assessment & Plan Note (Signed)
Known spinal stenosis.  Chronic problem with mild exacerbation.  Patient is doing allopurinol, topical anti-inflammatories, wants to avoid oral anti-inflammatories as well as the gabapentin at this point secondary to side effects.  Patient does respond fairly well to home exercise.  Increase activity, follow-up again in 4 to 8 weeks.

## 2019-10-11 NOTE — Patient Instructions (Signed)
Win the tournament See me in 6 weeks

## 2019-10-11 NOTE — Progress Notes (Signed)
[name] [address] [place] [address] Phone: [phone] Subjective:   [name], am serving as a scribe for Dr. Antoine Primas. This visit occurred during the SARS-CoV-2 public health emergency.  Safety protocols were in place, including screening questions prior to the visit, additional usage of staff PPE, and extensive cleaning of exam room while observing appropriate contact time as indicated for disinfecting solutions.   I'm seeing this patient by the request  of:  [name], MD  CC: Low back pain follow-up  [id number]  Mohamud Mrozek is a 85 y.o. male coming in with complaint of back and neck pain. OMT 09/12/2019. Patient states that his back pain has not changed since last visit. Overall doing ok besides joint pain throughout his entire body from arthritis.   Medications patient has been prescribed: Topical anti-inflammatories, allopurinol  Taking: Yes         Reviewed prior external information including notes and imaging from previsou exam, outside providers and external EMR if available.   As well as notes that were available from care everywhere and other healthcare systems.  Past medical history, social, surgical and family history all reviewed in electronic medical record.  No pertanent information unless stated regarding to the chief complaint.   Past Medical History:  Diagnosis Date  . Cataract   . GERD (gastroesophageal reflux disease)   . Hiatal hernia   . Hypertension     Allergies  Allergen Reactions  . Ace Inhibitors Swelling    After taking for long time had (facial) angioedema on lisinopril     Review of Systems:  No headache, visual changes, nausea, vomiting, diarrhea, constipation, dizziness, abdominal pain, skin rash, fevers, chills, night sweats, weight loss, swollen lymph nodes, body aches, joint swelling, chest pain, shortness of breath, mood changes. POSITIVE muscle aches  Objective    Blood pressure 130/82, height 5\' 9"  (1.753 m), weight 185 lb (83.9 kg).   General: No apparent distress alert and oriented x3 mood and affect normal, dressed appropriately.  HEENT: Pupils equal, extraocular movements intact  Respiratory: Patient's speak in full sentences and does not appear short of breath  Cardiovascular: No lower extremity edema, non tender, no erythema  Neuro: Cranial nerves II through XII are intact, neurovascularly intact in all extremities with 2+ DTRs and 2+ pulses.  Gait normal with good balance and coordination.  MSK:  Non tender with full range of motion and good stability and symmetric strength and tone of shoulders, elbows, wrist, hip, knee and ankles bilaterally.  Back - Normal skin, Spine with normal alignment and no deformity.  No tenderness to vertebral process palpation.  Paraspinous muscles are not tender and without spasm.   Range of motion is full at neck and lumbar sacral regions  Osteopathic findings   T6 extended rotated and side bent left L2 flexed rotated and side bent right L4 flexed rotated and side bent left Sacrum right on right       Assessment and Plan:  Spinal stenosis of lumbar region with radiculopathy Known spinal stenosis.  Chronic problem with mild exacerbation.  Patient is doing allopurinol, topical anti-inflammatories, wants to avoid oral anti-inflammatories as well as the gabapentin at this point secondary to side effects.  Patient does respond fairly well to home exercise.  Increase activity, follow-up again in 4 to 8 weeks.    Nonallopathic problems  Decision today to treat with OMT was based on Physical Exam  After verbal consent patient  was treated with HVLA, ME, FPR techniques in  thoracic, lumbar, and sacral  areas  Patient tolerated the procedure well with improvement in symptoms  Patient given exercises, stretches and lifestyle modifications  See medications in patient instructions if given  Patient will  follow up in 4-8 weeks      The above documentation has been reviewed and is accurate and complete Lyndal Pulley, DO       Note: This dictation was prepared with Dragon dictation along with smaller phrase technology. Any transcriptional errors that result from this process are unintentional.

## 2019-10-15 ENCOUNTER — Encounter: Payer: Self-pay | Admitting: Family Medicine

## 2019-10-27 ENCOUNTER — Encounter: Payer: Self-pay | Admitting: Family Medicine

## 2019-11-15 ENCOUNTER — Other Ambulatory Visit: Payer: Self-pay | Admitting: Family Medicine

## 2019-11-23 ENCOUNTER — Ambulatory Visit: Payer: Medicare PPO | Admitting: Family Medicine

## 2019-11-23 ENCOUNTER — Other Ambulatory Visit: Payer: Self-pay

## 2019-11-23 ENCOUNTER — Encounter: Payer: Self-pay | Admitting: Family Medicine

## 2019-11-23 VITALS — BP 136/86 | HR 64 | Ht 69.0 in | Wt 190.0 lb

## 2019-11-23 DIAGNOSIS — M5416 Radiculopathy, lumbar region: Secondary | ICD-10-CM | POA: Diagnosis not present

## 2019-11-23 DIAGNOSIS — M999 Biomechanical lesion, unspecified: Secondary | ICD-10-CM | POA: Diagnosis not present

## 2019-11-23 DIAGNOSIS — M48061 Spinal stenosis, lumbar region without neurogenic claudication: Secondary | ICD-10-CM | POA: Diagnosis not present

## 2019-11-23 NOTE — Assessment & Plan Note (Signed)
Patient is stable with conservative therapy at this time.  Patient will continue to try to stay active.  Underlying problem such as the gout can potentially be contributing some.  Continue the allopurinol.  We will continue to stay active.  Follow-up again in 4 to 8 weeks

## 2019-11-23 NOTE — Progress Notes (Signed)
[name] [address] [place] [address] Phone: [phone] Subjective:   [name], am serving as a scribe for Dr. Antoine Primas. This visit occurred during the SARS-CoV-2 public health emergency.  Safety protocols were in place, including screening questions prior to the visit, additional usage of staff PPE, and extensive cleaning of exam room while observing appropriate contact time as indicated for disinfecting solutions.   I'm seeing this patient by the request  of:  [name], MD  CC: Low back pain follow-up  [id number]  Paul [name] is a 85 y.o. male coming in with complaint of back and neck pain. OMT 10/11/2019. Patient states that his back has not been too bad since last visit.   Medications patient has been prescribed: Very intermittently Tylenol and ibuprofen           Reviewed prior external information including notes and imaging from previsou exam, outside providers and external EMR if available.   As well as notes that were available from care everywhere and other healthcare systems.  Past medical history, social, surgical and family history all reviewed in electronic medical record.  No pertanent information unless stated regarding to the chief complaint.   Past Medical History:  Diagnosis Date  . Cataract   . GERD (gastroesophageal reflux disease)   . Hiatal hernia   . Hypertension     Allergies  Allergen Reactions  . Ace Inhibitors Swelling    After taking for long time had (facial) angioedema on lisinopril     Review of Systems:  No headache, visual changes, nausea, vomiting, diarrhea, constipation, dizziness, abdominal pain, skin rash, fevers, chills, night sweats, weight loss, swollen lymph nodes, body aches, joint swelling, chest pain, shortness of breath, mood changes. POSITIVE muscle aches  Objective  Blood pressure (!) 136/86, pulse 64, height 5\' 9"  (1.753 m), weight 190 lb (86.2 kg), SpO2 99  %.   General: No apparent distress alert and oriented x3 mood and affect normal, dressed appropriately.  HEENT: Pupils equal, extraocular movements intact  Respiratory: Patient's speak in full sentences and does not appear short of breath  Cardiovascular: No lower extremity edema, non tender, no erythema  Neuro: Cranial nerves II through XII are intact, neurovascularly intact in all extremities with 2+ DTRs and 2+ pulses.  Gait normal with good balance and coordination.  MSK: Arthritic changes of multiple joints.   Osteopathic findings  T7 extended rotated and side bent right L4 flexed rotated and side bent left Sacrum left on left      Assessment and Plan:  Spinal stenosis of lumbar region with radiculopathy Patient is stable with conservative therapy at this time.  Patient will continue to try to stay active.  Underlying problem such as the gout can potentially be contributing some.  Continue the allopurinol.  We will continue to stay active.  Follow-up again in 4 to 8 weeks    Nonallopathic problems  Decision today to treat with OMT was based on Physical Exam  After verbal consent patient was treated with HVLA, ME, FPR techniques in  thoracic, lumbar, and sacral  areas  Patient tolerated the procedure well with improvement in symptoms  Patient given exercises, stretches and lifestyle modifications  See medications in patient instructions if given  Patient will follow up in 4-8 weeks      The above documentation has been reviewed and is accurate and complete , DO       Note: This  dictation was prepared with Dragon dictation along with smaller phrase technology. Any transcriptional errors that result from this process are unintentional.

## 2019-11-27 ENCOUNTER — Other Ambulatory Visit: Payer: Self-pay | Admitting: Internal Medicine

## 2019-12-06 ENCOUNTER — Other Ambulatory Visit: Payer: Self-pay

## 2019-12-08 MED ORDER — CLONIDINE 0.3 MG/24HR TD PTWK: MEDICATED_PATCH | TRANSDERMAL | 3 refills | Status: DC

## 2019-12-13 ENCOUNTER — Other Ambulatory Visit: Payer: Self-pay | Admitting: Family Medicine

## 2019-12-13 DIAGNOSIS — H35033 Hypertensive retinopathy, bilateral: Secondary | ICD-10-CM | POA: Diagnosis not present

## 2019-12-13 DIAGNOSIS — H35363 Drusen (degenerative) of macula, bilateral: Secondary | ICD-10-CM | POA: Diagnosis not present

## 2019-12-13 DIAGNOSIS — Z961 Presence of intraocular lens: Secondary | ICD-10-CM | POA: Diagnosis not present

## 2019-12-13 DIAGNOSIS — H43812 Vitreous degeneration, left eye: Secondary | ICD-10-CM | POA: Diagnosis not present

## 2019-12-13 DIAGNOSIS — H353131 Nonexudative age-related macular degeneration, bilateral, early dry stage: Secondary | ICD-10-CM | POA: Diagnosis not present

## 2019-12-19 DIAGNOSIS — Z20828 Contact with and (suspected) exposure to other viral communicable diseases: Secondary | ICD-10-CM | POA: Diagnosis not present

## 2019-12-27 ENCOUNTER — Encounter: Payer: Self-pay | Admitting: Family Medicine

## 2019-12-29 ENCOUNTER — Ambulatory Visit: Payer: Medicare PPO | Admitting: Family Medicine

## 2019-12-30 ENCOUNTER — Encounter: Payer: Self-pay | Admitting: Family Medicine

## 2019-12-30 ENCOUNTER — Other Ambulatory Visit: Payer: Self-pay

## 2019-12-30 MED ORDER — ALLOPURINOL 100 MG PO TABS: 200.0000 mg | ORAL_TABLET | Freq: Every day | ORAL | 3 refills | Status: DC

## 2020-01-03 ENCOUNTER — Other Ambulatory Visit: Payer: Self-pay | Admitting: Internal Medicine

## 2020-01-04 ENCOUNTER — Other Ambulatory Visit: Payer: Self-pay | Admitting: Internal Medicine

## 2020-01-05 ENCOUNTER — Ambulatory Visit: Payer: Medicare PPO | Admitting: Family Medicine

## 2020-01-05 ENCOUNTER — Encounter: Payer: Self-pay | Admitting: Family Medicine

## 2020-01-05 ENCOUNTER — Other Ambulatory Visit: Payer: Self-pay

## 2020-01-05 VITALS — BP 134/70 | HR 62 | Ht 69.0 in | Wt 189.0 lb

## 2020-01-05 DIAGNOSIS — M5416 Radiculopathy, lumbar region: Secondary | ICD-10-CM | POA: Diagnosis not present

## 2020-01-05 DIAGNOSIS — M7062 Trochanteric bursitis, left hip: Secondary | ICD-10-CM

## 2020-01-05 DIAGNOSIS — M999 Biomechanical lesion, unspecified: Secondary | ICD-10-CM | POA: Diagnosis not present

## 2020-01-05 DIAGNOSIS — G8929 Other chronic pain: Secondary | ICD-10-CM | POA: Diagnosis not present

## 2020-01-05 DIAGNOSIS — M25532 Pain in left wrist: Secondary | ICD-10-CM | POA: Diagnosis not present

## 2020-01-05 DIAGNOSIS — M48061 Spinal stenosis, lumbar region without neurogenic claudication: Secondary | ICD-10-CM

## 2020-01-05 NOTE — Progress Notes (Signed)
[facility] [address] [place] Phone: [phone] Subjective:   I Paul [name] am serving as a Education administrator for Dr. Hulan Saas.  This visit occurred during the SARS-CoV-2 public health emergency.  Safety protocols were in place, including screening questions prior to the visit, additional usage of staff PPE, and extensive cleaning of exam room while observing appropriate contact time as indicated for disinfecting solutions.   I'm seeing this patient by the request  of:  Paul La, MD  CC: Back pain follow-up  [id number]  Paul [name] is a 85 y.o. male coming in with complaint of back and neck pain. OMT 11/23/2019. Patient states he is doing well today. Mild discomfort overall. Discussed continuing no new is intermittent. Patient states that if he only plays golf 3 or 4 times a week he seems to do a little better. Patient is still doing tai chi from time to time. Patient lastly handout though for hip flexor exercises and looking for that again.  Medications patient has been prescribed: None  Taking: Tylenol intermittently         Reviewed prior external information including notes and imaging from previsou exam, outside providers and external EMR if available.   As well as notes that were available from care everywhere and other healthcare systems.  Past medical history, social, surgical and family history all reviewed in electronic medical record.  No pertanent information unless stated regarding to the chief complaint.   Past Medical History:  Diagnosis Date  . Cataract   . GERD (gastroesophageal reflux disease)   . Hiatal hernia   . Hypertension     Allergies  Allergen Reactions  . Ace Inhibitors Swelling    After taking for long time had (facial) angioedema on lisinopril     Review of Systems:  No headache, visual changes, nausea, vomiting, diarrhea, constipation, dizziness, abdominal pain, skin rash, fevers,  chills, night sweats, weight loss, swollen lymph nodes, body aches, joint swelling, chest pain, shortness of breath, mood changes. POSITIVE muscle aches  Objective  There were no vitals taken for this visit.   General: No apparent distress alert and oriented x3 mood and affect normal, dressed appropriately.  HEENT: Pupils equal, extraocular movements intact  Respiratory: Patient's speak in full sentences and does not appear short of breath  Cardiovascular: No lower extremity edema, non tender, no erythema  Neuro: Cranial nerves II through XII are intact, neurovascularly intact in all extremities with 2+ DTRs and 2+ pulses.  Gait mild antalgic Arthritic changes of multiple joints Left-sided TFCC does have some swelling noted, mild tenderness over the scaphoid bone Neck exam does have some mild loss of lordosis, back pain increased recently.  Does have significant loss of lordosis at the moment.  Tender to palpation diffusely of the lower back.  Knee replacement noted otherwise.  Osteopathic findings   C4  flexed rotated and side bent left T8 extended rotated and side bent left L3 flexed rotated and side bent right Sacrum right on right       Assessment and Plan: Spinal stenosis of lumbar region with radiculopathy Mild exacerbation.  I do believe that patient is having some mild radicular symptoms that is contributing to some of the discomfort and pain.  Patient does not want to do another epidural at this time.  Responded fairly well to osteopathic manipulation.  Discussed the medications including the allopurinol as well as taking Tylenol regularly.  This is a mild exacerbation of  patient's chronic problem.  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  Patient is considering surgical intervention but does not know if he will do it or not discussed potential injections otherwise.  Greater  trochanteric bursitis of left hip Has had this recurrently, has been 1 year since injection that did seem to respond well.  Follow-up in if worsening symptoms will consider injection      Nonallopathic problems  Decision today to treat with OMT was based on Physical Exam  After verbal consent patient was treated with HVLA, ME, FPR techniques in cervical,  thoracic, lumbar, and sacral  areas  Patient tolerated the procedure well with improvement in symptoms  Patient given exercises, stretches and lifestyle modifications  See medications in patient instructions if given  Patient will follow up in 4-8 weeks      The above documentation has been reviewed and is accurate and complete [name]       Note: This dictation was prepared with Dragon dictation along with smaller phrase technology. Any transcriptional errors that result from this process are unintentional.

## 2020-01-05 NOTE — Patient Instructions (Addendum)
Good to see you Increase tylenol to twice a day If hip acts up we can inject the side of it Hip flexor exercises See me again in 6 weeks enjoy [place]

## 2020-01-05 NOTE — Assessment & Plan Note (Signed)
Has had this recurrently, has been 1 year since injection that did seem to respond well.  Follow-up in if worsening symptoms will consider injection

## 2020-01-05 NOTE — Assessment & Plan Note (Signed)
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  Patient is considering surgical intervention but does not know if he will do it or not discussed potential injections otherwise.

## 2020-01-05 NOTE — Assessment & Plan Note (Signed)
Mild exacerbation.  I do believe that patient is having some mild radicular symptoms that is contributing to some of the discomfort and pain.  Patient does not want to do another epidural at this time.  Responded fairly well to osteopathic manipulation.  Discussed the medications including the allopurinol as well as taking Tylenol regularly.  This is a mild exacerbation of patient's chronic problem.  Follow-up with me again in 4 to 8 weeks

## 2020-01-11 ENCOUNTER — Encounter: Payer: Self-pay | Admitting: Family Medicine

## 2020-01-11 ENCOUNTER — Ambulatory Visit: Payer: Medicare PPO | Admitting: Family Medicine

## 2020-01-11 ENCOUNTER — Other Ambulatory Visit: Payer: Self-pay

## 2020-01-11 VITALS — BP 128/68 | HR 63 | Ht 69.0 in | Wt 188.0 lb

## 2020-01-11 DIAGNOSIS — R3914 Feeling of incomplete bladder emptying: Secondary | ICD-10-CM

## 2020-01-11 DIAGNOSIS — R351 Nocturia: Secondary | ICD-10-CM | POA: Diagnosis not present

## 2020-01-11 DIAGNOSIS — N401 Enlarged prostate with lower urinary tract symptoms: Secondary | ICD-10-CM

## 2020-01-11 DIAGNOSIS — K2100 Gastro-esophageal reflux disease with esophagitis, without bleeding: Secondary | ICD-10-CM | POA: Diagnosis not present

## 2020-01-11 DIAGNOSIS — N1832 Chronic kidney disease, stage 3b: Secondary | ICD-10-CM | POA: Diagnosis not present

## 2020-01-11 MED ORDER — PANTOPRAZOLE SODIUM 40 MG PO TBEC
40.0000 mg | DELAYED_RELEASE_TABLET | Freq: Every day | ORAL | 3 refills | Status: DC
Start: 2020-01-11 — End: 2020-06-28

## 2020-01-11 NOTE — Patient Instructions (Signed)
Enjoy your trip to [place]. Great to see you!

## 2020-01-12 DIAGNOSIS — N401 Enlarged prostate with lower urinary tract symptoms: Secondary | ICD-10-CM | POA: Insufficient documentation

## 2020-01-12 NOTE — Assessment & Plan Note (Signed)
  I suspect he needs to go back to bid PPI. We discussed and I urged him to call his GI doc for input. Last EGD 03/2018 is reviewed. LA Grade A (one or more mucosal breaks less than 5 mm, not extending between tops of 2 mucosal folds) esophagitis with no bleeding was found 35 cm from the incisors. Findings: - A 5 cm hiatal hernia was found. The proximal extent of the gastric folds (end of tubular esophagus) was 35 cm from the incisors. The hiatal narrowing was 40 cm from the incisors. The Z-line was 35 cm from the incisors. - The entire examined stomach was normal. - The examined duodenum was normal.

## 2020-01-12 NOTE — Progress Notes (Signed)
    CHIEF COMPLAINT / HPI:  1. Various MSK issues: Left hip pain, thinks it is bursitis. Mild and actually improved today. Only bothers him intermittently. 2. Increased frequency of urination at night despite Flomax. Has not seen his urologist in > 1 year. Continues with Flomax. Has gone from 2 awakenings a night to 4. 3. Having breakthrough reflux since he switched to once daily PPI plus famotadine at night. Previously had been on bid PPI with better control. Says he changed because he was concerned about increasing creatinine in setting chronic PPI use.   PERTINENT  PMH / PSH: I have reviewed the patient's medications, allergies, past medical and surgical history, smoking status and updated in the EMR as appropriate.   OBJECTIVE:  BP 128/68   Pulse 63   Ht 5\' 9"  (1.753 m)   Wt 188 lb (85.3 kg)   SpO2 98%   BMI 27.76 kg/m   Vital signs are reviewed GEN.: Well-developed in no acute distress,  CV: Regular rate and rhythm RESPIRATORY: Normal respiratory effort PSYCHIATRIC: Alert and oriented 4. Affect is interactive. Speech is normal in fluency in content. Judgment is normal. Movement of Extremity X 4. Mild ttp left hip greater trochanteric area.  ASSESSMENT / PLAN:   GERD  I suspect he needs to go back to bid PPI. We discussed and I urged him to call his GI doc for input. Last EGD 03/2018 is reviewed. LA Grade A (one or more mucosal breaks less than 5 mm, not extending between tops of 2 mucosal folds) esophagitis with no bleeding was found 35 cm from the incisors. Findings: - A 5 cm hiatal hernia was found. The proximal extent of the gastric folds (end of tubular esophagus) was 35 cm from the incisors. The hiatal narrowing was 40 cm from the incisors. The Z-line was 35 cm from the incisors. - The entire examined stomach was normal. - The examined duodenum was normal.  BPH associated with nocturia Referral back to urology Continue flowmax for now  CHRONIC KIDNEY DISEASE  STAGE III (MODERATE) Recheck his creatinine in about 3 m   04/2018 MD

## 2020-01-12 NOTE — Assessment & Plan Note (Signed)
Referral back to urology Continue flowmax for now

## 2020-01-12 NOTE — Assessment & Plan Note (Signed)
Recheck his creatinine in about 3 m

## 2020-02-15 ENCOUNTER — Other Ambulatory Visit: Payer: Self-pay | Admitting: Family Medicine

## 2020-02-15 DIAGNOSIS — R351 Nocturia: Secondary | ICD-10-CM | POA: Diagnosis not present

## 2020-02-15 DIAGNOSIS — N5201 Erectile dysfunction due to arterial insufficiency: Secondary | ICD-10-CM | POA: Diagnosis not present

## 2020-02-15 DIAGNOSIS — R3915 Urgency of urination: Secondary | ICD-10-CM | POA: Diagnosis not present

## 2020-02-15 DIAGNOSIS — N401 Enlarged prostate with lower urinary tract symptoms: Secondary | ICD-10-CM | POA: Diagnosis not present

## 2020-02-15 DIAGNOSIS — N3281 Overactive bladder: Secondary | ICD-10-CM | POA: Diagnosis not present

## 2020-02-15 NOTE — Progress Notes (Signed)
[name] [address] [place] [address] Phone: [phone] Subjective:   Paul [name], am serving as a scribe for Dr. Antoine Primas. This visit occurred during the SARS-CoV-2 public health emergency.  Safety protocols were in place, including screening questions prior to the visit, additional usage of staff PPE, and extensive cleaning of exam room while observing appropriate contact time as indicated for disinfecting solutions.   I'm seeing this patient by the request  of:  [name], MD  CC: Neck and back pain follow-up  [id number]  Shaye Lagace is a 85 y.o. male coming in with complaint of back and neck pain. OMT 01/05/2020. Patient states that he is feeling so so is ready for manipulation.  Patient otherwise has been doing fairly well.  Does have a wrist arthritis.  Has noticed some mild swelling on the left side again.  Does not know if an injection is necessary yet.  Medications patient has been prescribed: Gabapentin  Taking: No         Reviewed prior external information including notes and imaging from previsou exam, outside providers and external EMR if available.   As well as notes that were available from care everywhere and other healthcare systems.  Past medical history, social, surgical and family history all reviewed in electronic medical record.  No pertanent information unless stated regarding to the chief complaint.   Past Medical History:  Diagnosis Date  . Cataract   . GERD (gastroesophageal reflux disease)   . Hiatal hernia   . Hypertension     Allergies  Allergen Reactions  . Ace Inhibitors Swelling    After taking for long time had (facial) angioedema on lisinopril     Review of Systems:  No headache, visual changes, nausea, vomiting, diarrhea, constipation, dizziness, abdominal pain, skin rash, fevers, chills, night sweats, weight loss, swollen lymph nodes, body aches, joint swelling, chest  pain, shortness of breath, mood changes. POSITIVE muscle aches  Objective  Blood pressure 124/60, pulse 83, height 5\' 9"  (1.753 m), weight 190 lb (86.2 kg), SpO2 98 %.   General: No apparent distress alert and oriented x3 mood and affect normal, dressed appropriately.  HEENT: Pupils equal, extraocular movements intact  Respiratory: Patient's speak in full sentences and does not appear short of breath  Cardiovascular: No lower extremity edema, non tender, no erythema  Neuro: Cranial nerves II through XII are intact, neurovascularly intact in all extremities with 2+ DTRs and 2+ pulses.  Gait normal with good balance and coordination.  MSK: Arthritic changes of multiple joints.  Left wrist does have swelling noted over the radiocarpal joint and mildly over the [facility] joint.  Mild pain over the TFCC  Osteopathic findings  T5 extended rotated and side bent left L2 flexed rotated and side bent right Sacrum right on right       Assessment and Plan:  Spinal stenosis of lumbar region with radiculopathy Moderate to severe but responding well to manipulation.  Discussed posture and ergonomics, discussed core strengthening.  Patient will continue to increase activity and see me every 6 to 8 weeks  Arthritis of carpometacarpal joint Patient has had trouble with the [facility] joint as well as the radiocarpal joint and the TFCC.  Has responded well to injections and can encouraged that again if necessary.    Nonallopathic problems  Decision today to treat with OMT was based on Physical Exam  After verbal consent patient was treated with HVLA, ME, FPR techniques  in  thoracic, lumbar, and sacral  areas  Patient tolerated the procedure well with improvement in symptoms  Patient given exercises, stretches and lifestyle modifications  See medications in patient instructions if given  Patient will follow up in 4-8 weeks      The above documentation has been reviewed and is accurate and complete  [name], DO       Note: This dictation was prepared with Dragon dictation along with smaller phrase technology. Any transcriptional errors that result from this process are unintentional.

## 2020-02-16 ENCOUNTER — Ambulatory Visit: Payer: Medicare PPO | Admitting: Family Medicine

## 2020-02-16 ENCOUNTER — Encounter: Payer: Self-pay | Admitting: Family Medicine

## 2020-02-16 ENCOUNTER — Other Ambulatory Visit: Payer: Self-pay

## 2020-02-16 VITALS — BP 124/60 | HR 83 | Ht 69.0 in | Wt 190.0 lb

## 2020-02-16 DIAGNOSIS — M5416 Radiculopathy, lumbar region: Secondary | ICD-10-CM

## 2020-02-16 DIAGNOSIS — M48061 Spinal stenosis, lumbar region without neurogenic claudication: Secondary | ICD-10-CM

## 2020-02-16 DIAGNOSIS — M999 Biomechanical lesion, unspecified: Secondary | ICD-10-CM | POA: Diagnosis not present

## 2020-02-16 DIAGNOSIS — M19049 Primary osteoarthritis, unspecified hand: Secondary | ICD-10-CM

## 2020-02-16 NOTE — Assessment & Plan Note (Signed)
Moderate to severe but responding well to manipulation.  Discussed posture and ergonomics, discussed core strengthening.  Patient will continue to increase activity and see me every 6 to 8 weeks

## 2020-02-16 NOTE — Patient Instructions (Addendum)
Good to see you  You are doing great over all  Lotion 2 times daily for wrist If worsens can inject call sooner See me again in 6 weeks

## 2020-02-16 NOTE — Assessment & Plan Note (Signed)
Patient has had trouble with the [facility] joint as well as the radiocarpal joint and the TFCC.  Has responded well to injections and can encouraged that again if necessary.

## 2020-03-21 DIAGNOSIS — H35033 Hypertensive retinopathy, bilateral: Secondary | ICD-10-CM | POA: Diagnosis not present

## 2020-03-21 DIAGNOSIS — H353131 Nonexudative age-related macular degeneration, bilateral, early dry stage: Secondary | ICD-10-CM | POA: Diagnosis not present

## 2020-03-21 DIAGNOSIS — H04123 Dry eye syndrome of bilateral lacrimal glands: Secondary | ICD-10-CM | POA: Diagnosis not present

## 2020-03-21 DIAGNOSIS — Z961 Presence of intraocular lens: Secondary | ICD-10-CM | POA: Diagnosis not present

## 2020-03-29 ENCOUNTER — Ambulatory Visit: Payer: Medicare PPO | Admitting: Family Medicine

## 2020-03-29 ENCOUNTER — Encounter: Payer: Self-pay | Admitting: Family Medicine

## 2020-03-29 ENCOUNTER — Other Ambulatory Visit: Payer: Self-pay

## 2020-03-29 VITALS — BP 126/72 | HR 68 | Ht 69.0 in | Wt 190.0 lb

## 2020-03-29 DIAGNOSIS — M48061 Spinal stenosis, lumbar region without neurogenic claudication: Secondary | ICD-10-CM

## 2020-03-29 DIAGNOSIS — M999 Biomechanical lesion, unspecified: Secondary | ICD-10-CM | POA: Diagnosis not present

## 2020-03-29 DIAGNOSIS — M19049 Primary osteoarthritis, unspecified hand: Secondary | ICD-10-CM

## 2020-03-29 DIAGNOSIS — M5416 Radiculopathy, lumbar region: Secondary | ICD-10-CM

## 2020-03-29 NOTE — Progress Notes (Signed)
[name] [address] [place] [address] Phone: [phone] Subjective:   Paul [name], am serving as a scribe for Dr. Antoine Primas. This visit occurred during the SARS-CoV-2 public health emergency.  Safety protocols were in place, including screening questions prior to the visit, additional usage of staff PPE, and extensive cleaning of exam room while observing appropriate contact time as indicated for disinfecting solutions.   I'm seeing this patient by the request  of:  [name], MD  CC: back and neck pain   [id number]  [name] is a 85 y.o. male coming in with complaint of back and neck pain. OMT 02/16/2020. Patient states he feels "so so." Nothing new, more pain in the wrist   Medications patient has been prescribed: None         Reviewed prior external information including notes and imaging from previsou exam, outside providers and external EMR if available.   As well as notes that were available from care everywhere and other healthcare systems.  Past medical history, social, surgical and family history all reviewed in electronic medical record.  No pertanent information unless stated regarding to the chief complaint.   Past Medical History:  Diagnosis Date  . Cataract   . GERD (gastroesophageal reflux disease)   . Hiatal hernia   . Hypertension     Allergies  Allergen Reactions  . Ace Inhibitors Swelling    After taking for long time had (facial) angioedema on lisinopril     Review of Systems:  No headache, visual changes, nausea, vomiting, diarrhea, constipation, dizziness, abdominal pain, skin rash, fevers, chills, night sweats, weight loss, swollen lymph nodes, body aches, joint swelling, chest pain, shortness of breath, mood changes. POSITIVE muscle aches  Objective  Blood pressure 126/72, pulse 68, height 5\' 9"  (1.753 m), weight 190 lb (86.2 kg), SpO2 99 %.   General: No apparent distress alert  and oriented x3 mood and affect normal, dressed appropriately.  HEENT: Pupils equal, extraocular movements intact  Respiratory: Patient's speak in full sentences and does not appear short of breath  Cardiovascular: No lower extremity edema, non tender, no erythema  Low back exam TTP patient has pain in the lumbar region minorly.  Patient does have limited range of motion lacking last 5 degrees of flexion in the last 10 degrees of extension.  No weakness of the lower extremities.  Tightness noted of the [facility] right greater than left. Left wrist exam shows the patient does pain over the wrist on the dorsal aspect near the [facility] and the carpal metacarpal joints. Osteopathic findings   T7 extended rotated and side bent left L2 flexed rotated and side bent right Sacrum right on right     Assessment and Plan:  Spinal stenosis of lumbar region with radiculopathy Chronic problem with mild exacerbation.  Giving more topical anti-inflammatories.  Discussed with patient about icing regimen and home exercises.  Patient is doing relatively well and is still able to play golf on a fairly regular basis.  Follow-up with me again in 6 weeks  Arthritis of carpometacarpal joint Patient wants to hold on any type of injection.  Has been dealing with it for nearly 8 years.  Still able to do most activities without any difficulty.    Nonallopathic problems  Decision today to treat with OMT was based on Physical Exam  After verbal consent patient was treated with HVLA, ME, FPR techniques in  thoracic, lumbar, and sacral  areas  Patient tolerated the procedure well with improvement in symptoms  Patient given exercises, stretches and lifestyle modifications  See medications in patient instructions if given  Patient will follow up in 4-8 weeks      The above documentation has been reviewed and is accurate and complete [name], DO       Note: This dictation was prepared with Dragon dictation  along with smaller phrase technology. Any transcriptional errors that result from this process are unintentional.

## 2020-03-29 NOTE — Assessment & Plan Note (Signed)
Chronic problem with mild exacerbation.  Giving more topical anti-inflammatories.  Discussed with patient about icing regimen and home exercises.  Patient is doing relatively well and is still able to play golf on a fairly regular basis.  Follow-up with me again in 6 weeks

## 2020-03-29 NOTE — Patient Instructions (Addendum)
Good to see you  Wrist we will watch [employer]'s pennsaid pinkie amount topically 2 times daily as needed. See me again in 5-6 weeks

## 2020-03-29 NOTE — Assessment & Plan Note (Signed)
Patient wants to hold on any type of injection.  Has been dealing with it for nearly 8 years.  Still able to do most activities without any difficulty.

## 2020-04-27 IMAGING — CR LUMBAR SPINE - 2-3 VIEW
3 series · 3 of 3 positions shown · non-contrast
Comparison: Lumbar spine radiographs 12/04/2015.

CLINICAL DATA: Low back and right hip pain since falling 3 days
ago.

EXAM:
LUMBAR SPINE - 2-3 VIEW

[t l-spine a.p.]
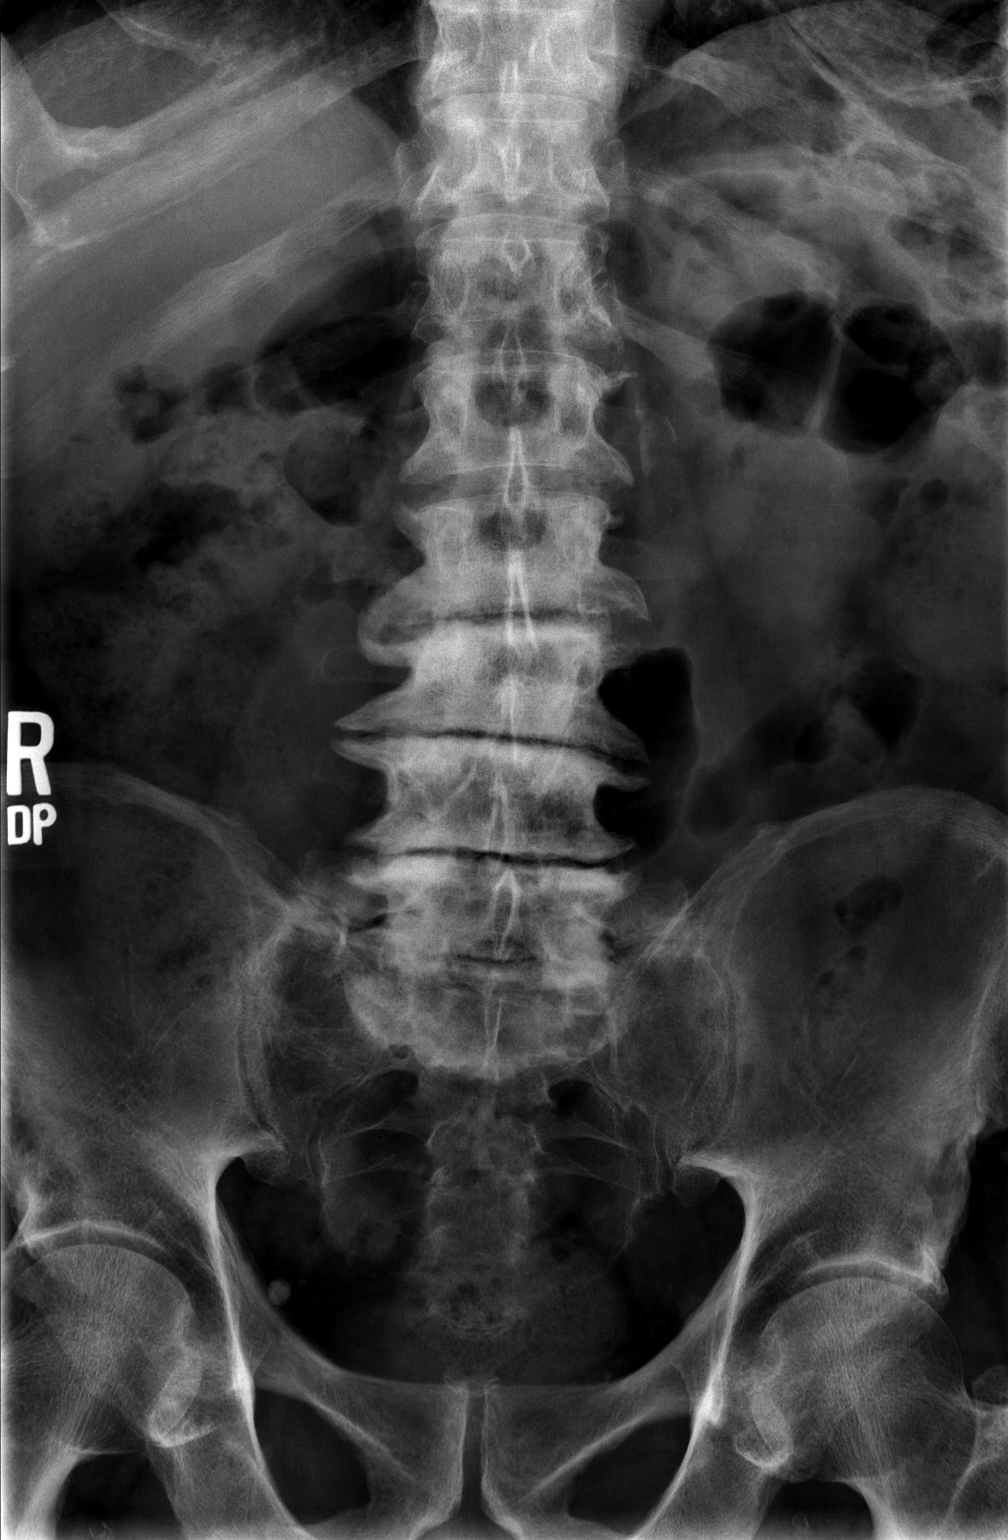

[t l-spine lat]
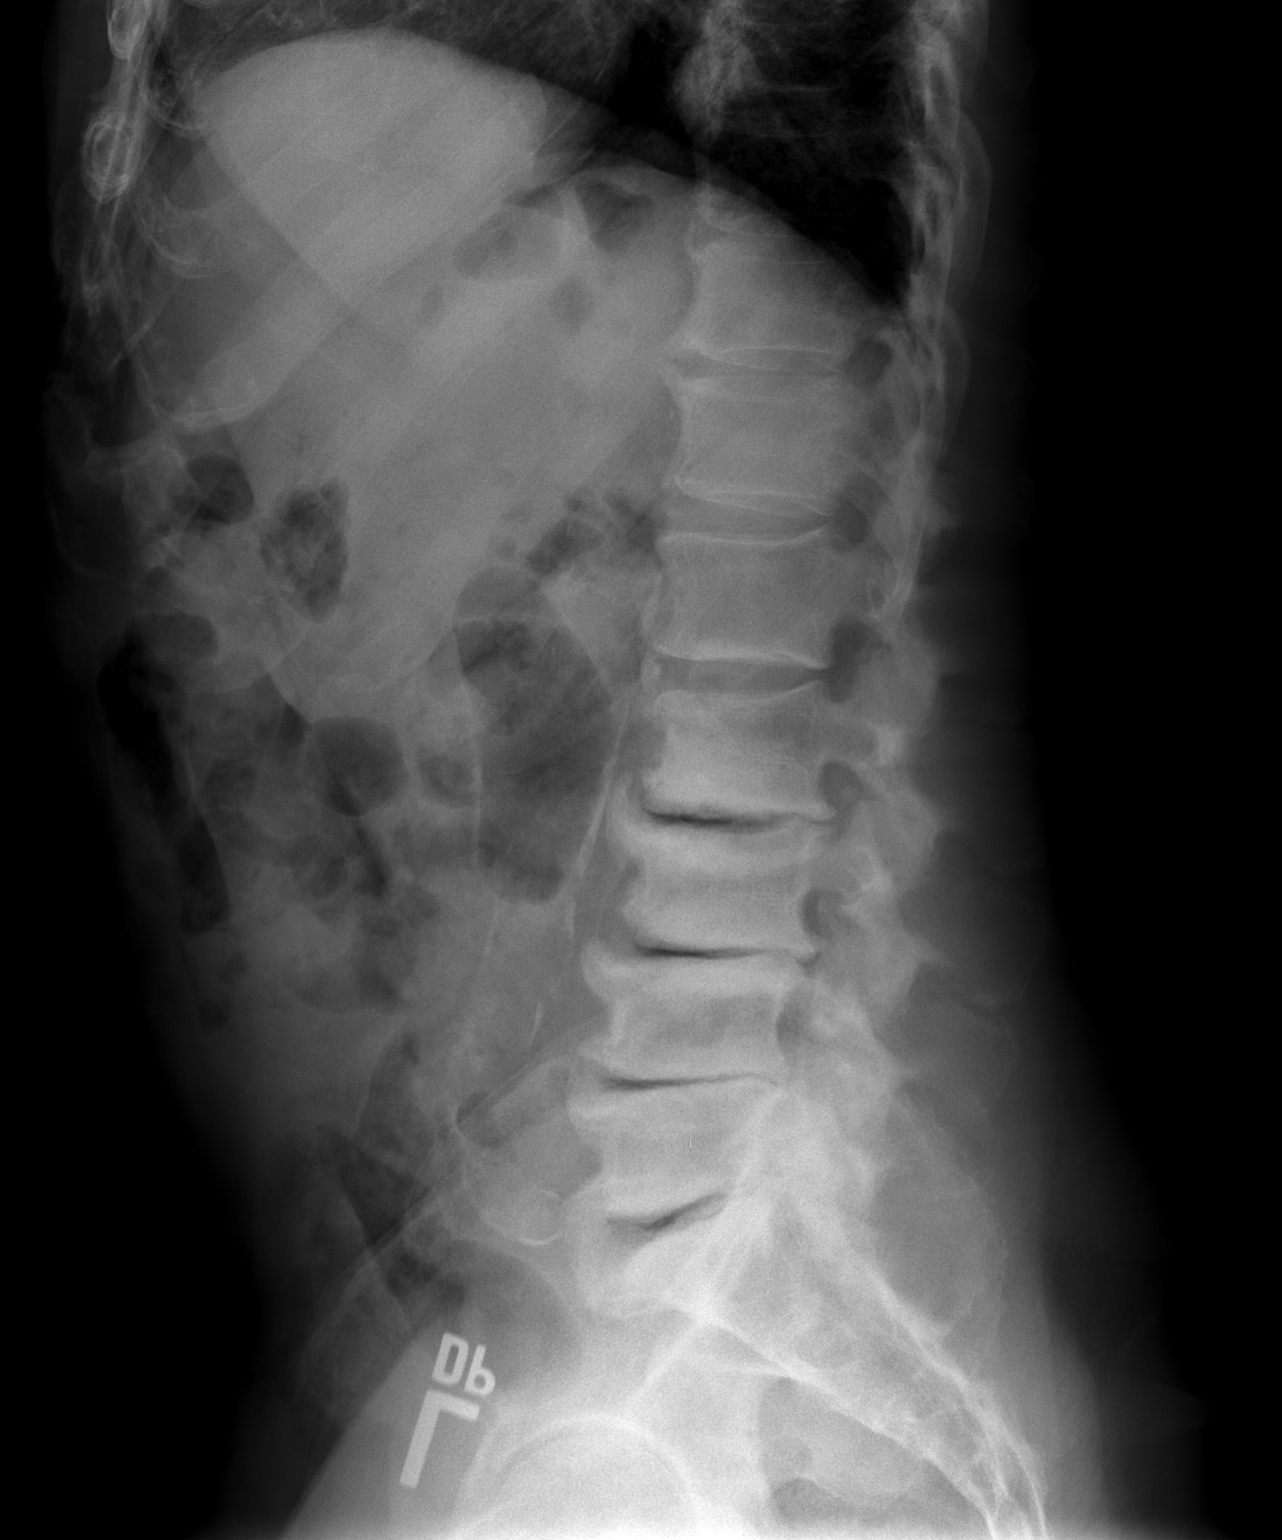

[t l-spine l5-s1 spot]
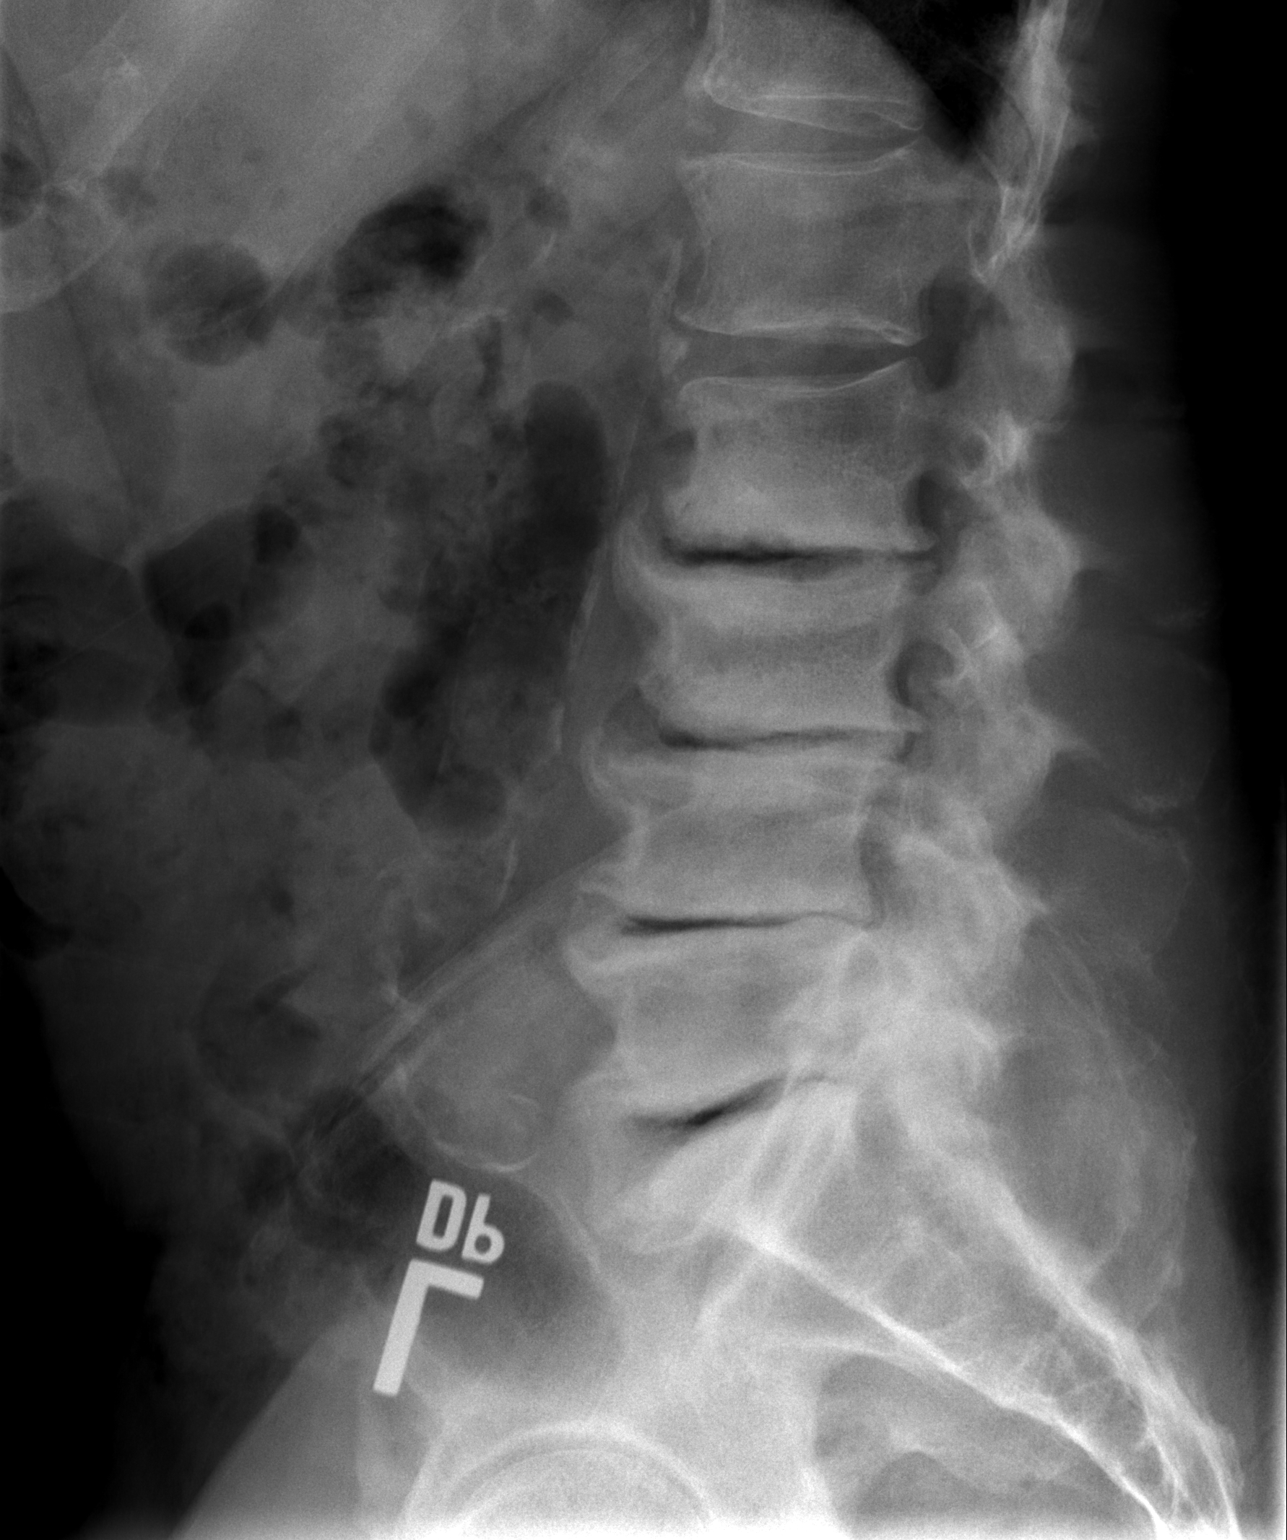

[3 of 3 positions shown; findings below may reference images not displayed]

FINDINGS: There are 5 lumbar type vertebral bodies. The alignment is stable
with a slight retrolisthesis at the L2-3 and L3-4 levels. There is
severe multilevel spondylosis with endplate osteophytes and vacuum
phenomenon from L2-3 through L5-S1. No evidence of acute fracture or
pars defect. Aortoiliac atherosclerosis noted.
IMPRESSION: Severe lumbar spondylosis, similar to remote radiographs. No acute
osseous findings identified.

## 2020-04-27 IMAGING — CR PELVIS - 1-2 VIEW
1 series · 1 of 1 positions shown · non-contrast
Comparison: Bilateral hip radiographs 06/22/2018.

CLINICAL DATA: Low back and right hip pain since falling 3 days
ago.

EXAM:
PELVIS - 1-2 VIEW

[t pelvis a.p.]
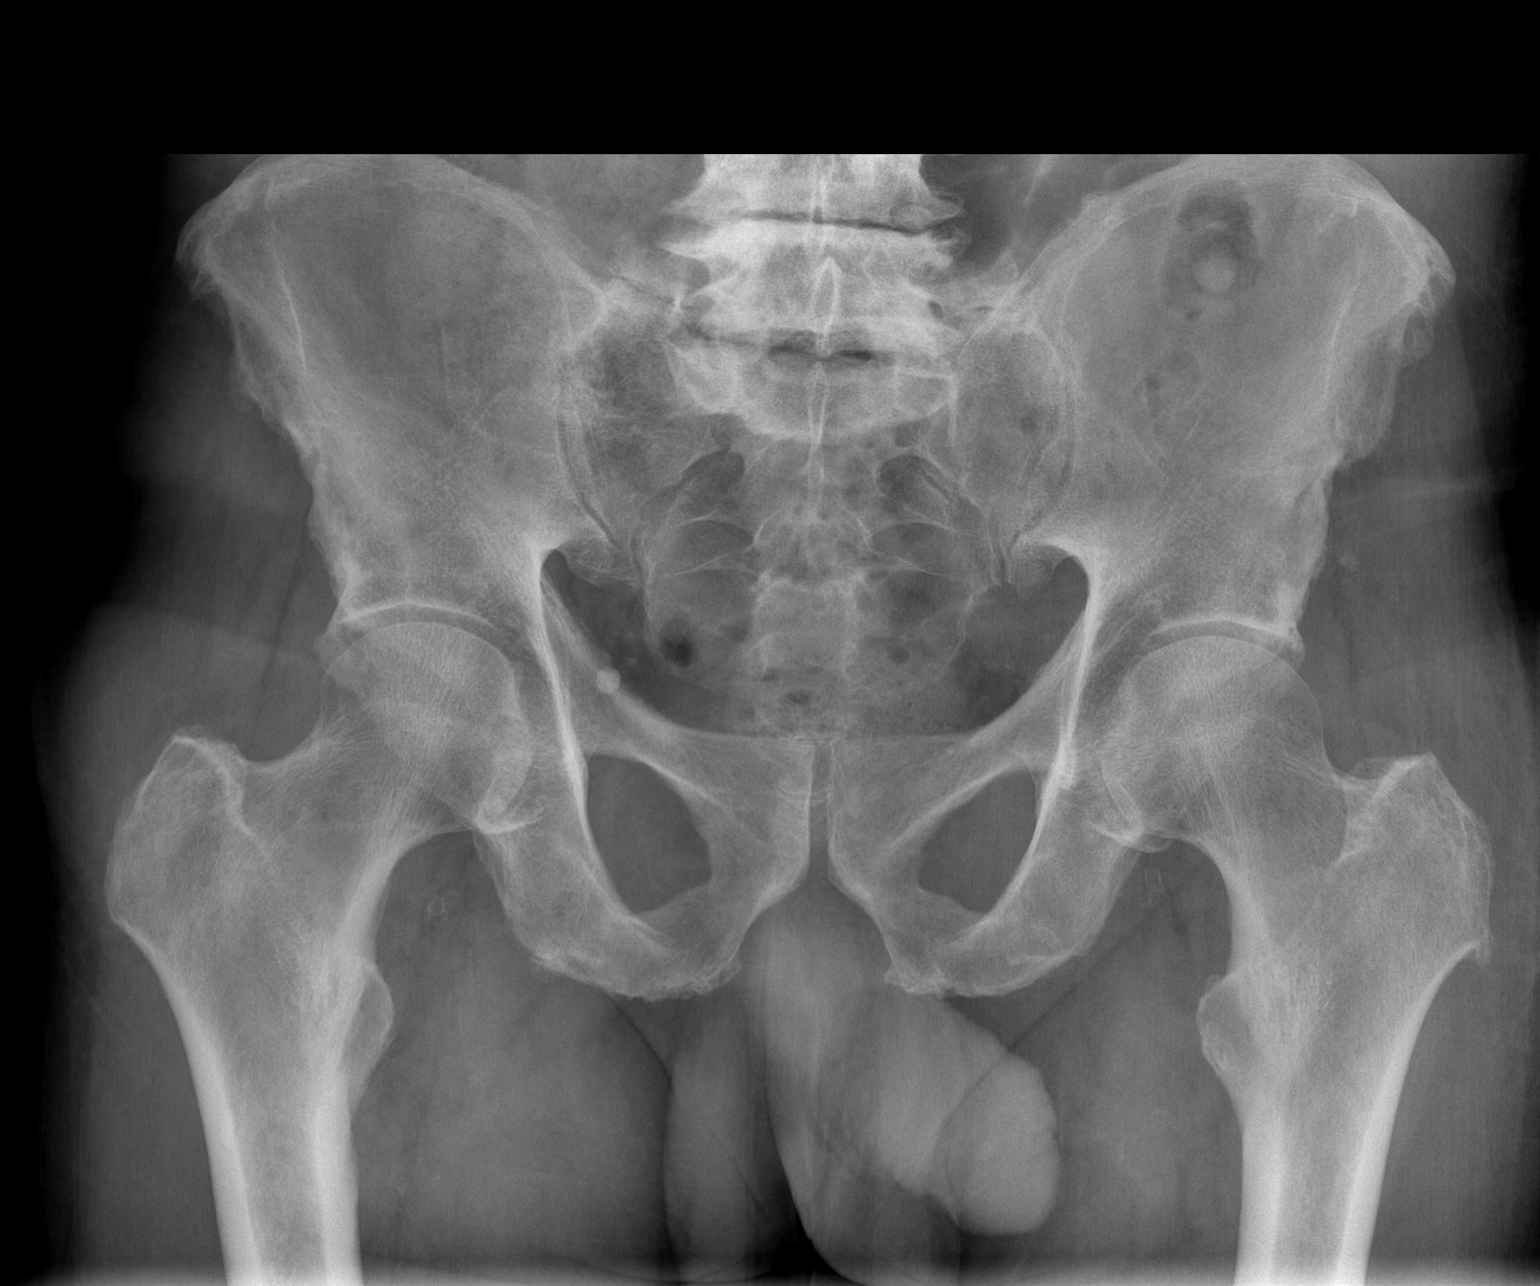

[1 of 1 positions shown; findings below may reference images not displayed]

FINDINGS: The mineralization and alignment are normal. There is no evidence of
acute fracture or dislocation. No evidence of femoral head avascular
necrosis. The hip joint spaces are preserved. There are advanced
degenerative changes in the lower lumbar spine. The sacroiliac
joints appear unremarkable.
IMPRESSION: No acute pelvic findings or significant hip arthropathy. Lower
lumbar spondylosis.

## 2020-05-02 NOTE — Progress Notes (Deleted)
  [name] [address] [place] [address] Phone: [phone] Subjective:    I'm seeing this patient by the request  of:  [name], MD  CC:   [id number]  Jameel Quant is a 86 y.o. male coming in with complaint of back and neck pain. OMT 03/29/2020. [facility] joint arthritis f/u as well. Patient states   Medications patient has been prescribed:   Taking:         Reviewed prior external information including notes and imaging from previsou exam, outside providers and external EMR if available.   As well as notes that were available from care everywhere and other healthcare systems.  Past medical history, social, surgical and family history all reviewed in electronic medical record.  No pertanent information unless stated regarding to the chief complaint.   Past Medical History:  Diagnosis Date  . Cataract   . GERD (gastroesophageal reflux disease)   . Hiatal hernia   . Hypertension     Allergies  Allergen Reactions  . Ace Inhibitors Swelling    After taking for long time had (facial) angioedema on lisinopril     Review of Systems:  No headache, visual changes, nausea, vomiting, diarrhea, constipation, dizziness, abdominal pain, skin rash, fevers, chills, night sweats, weight loss, swollen lymph nodes, body aches, joint swelling, chest pain, shortness of breath, mood changes. POSITIVE muscle aches  Objective  There were no vitals taken for this visit.   General: No apparent distress alert and oriented x3 mood and affect normal, dressed appropriately.  HEENT: Pupils equal, extraocular movements intact  Respiratory: Patient's speak in full sentences and does not appear short of breath  Cardiovascular: No lower extremity edema, non tender, no erythema  Neuro: Cranial nerves II through XII are intact, neurovascularly intact in all extremities with 2+ DTRs and 2+ pulses.  Gait normal with good balance and coordination.  MSK:  Non  tender with full range of motion and good stability and symmetric strength and tone of shoulders, elbows, wrist, hip, knee and ankles bilaterally.  Back - Normal skin, Spine with normal alignment and no deformity.  No tenderness to vertebral process palpation.  Paraspinous muscles are not tender and without spasm.   Range of motion is full at neck and lumbar sacral regions  Osteopathic findings  C2 flexed rotated and side bent right C6 flexed rotated and side bent left T3 extended rotated and side bent right inhaled rib T9 extended rotated and side bent left L2 flexed rotated and side bent right Sacrum right on right       Assessment and Plan:    Nonallopathic problems  Decision today to treat with OMT was based on Physical Exam  After verbal consent patient was treated with HVLA, ME, FPR techniques in cervical, rib, thoracic, lumbar, and sacral  areas  Patient tolerated the procedure well with improvement in symptoms  Patient given exercises, stretches and lifestyle modifications  See medications in patient instructions if given  Patient will follow up in 4-8 weeks      The above documentation has been reviewed and is accurate and complete Wilford Grist       Note: This dictation was prepared with Dragon dictation along with smaller phrase technology. Any transcriptional errors that result from this process are unintentional.

## 2020-05-03 ENCOUNTER — Ambulatory Visit: Payer: Medicare PPO | Admitting: Family Medicine

## 2020-05-03 ENCOUNTER — Other Ambulatory Visit: Payer: Self-pay

## 2020-05-03 NOTE — Progress Notes (Signed)
[name] [address] [place] [address] Phone: [phone] Subjective:   Paul [name], am serving as a scribe for Dr. Antoine Primas. This visit occurred during the SARS-CoV-2 public health emergency.  Safety protocols were in place, including screening questions prior to the visit, additional usage of staff PPE, and extensive cleaning of exam room while observing appropriate contact time as indicated for disinfecting solutions.   I'm seeing this patient by the request  of:  [name], MD  CC: Left shoulder pain, back pain follow-up  [id number]  Paul [name] is a 86 y.o. male coming in with complaint of back and neck pain. OMT 03/29/2020. Patient states overall mild discomfort here and there. Patient states that the back still tight. Patient does have the severe spinal stenosis but has done relatively well with the conservative therapy. Continues to work on tai chi and is able to play golf a significant amount. Patient has not taken anything for pain on a regular basis. Patient's last epidural was in January 2021. Does not feel like he needs another one.  Left shoulder pain. Patient did have a fall previously and was found to have more of a subluxation of the shoulder as well as acromioclavicular arthritis that did respond well to injections previously. Patient states that this does feel different. More pain on the posterior aspect of the shoulder. Denies any weakness. States it only happens when he sits in a specific location and then seems to get better with movement. No radiation down the arm. No neck pain associated with  Medications patient has been prescribed: None           Reviewed prior external information including notes and imaging from previsou exam, outside providers and external EMR if available.   As well as notes that were available from care everywhere and other healthcare systems.  Past medical history, social,  surgical and family history all reviewed in electronic medical record.  No pertanent information unless stated regarding to the chief complaint.   Past Medical History:  Diagnosis Date  . Cataract   . GERD (gastroesophageal reflux disease)   . Hiatal hernia   . Hypertension     Allergies  Allergen Reactions  . Ace Inhibitors Swelling    After taking for long time had (facial) angioedema on lisinopril     Review of Systems:  No headache, visual changes, nausea, vomiting, diarrhea, constipation, dizziness, abdominal pain, skin rash, fevers, chills, night sweats, weight loss, swollen lymph nodes,  joint swelling, chest pain, shortness of breath, mood changes. POSITIVE muscle aches, body aches  Objective  Blood pressure 132/84, pulse 67, height 5\' 9"  (1.753 m), weight 194 lb (88 kg), SpO2 98 %.   General: No apparent distress alert and oriented x3 mood and affect normal, dressed appropriately.  HEENT: Pupils equal, extraocular movements intact  Respiratory: Patient's speak in full sentences and does not appear short of breath  Cardiovascular: No lower extremity edema, non tender, no erythema  Patient's left shoulder does have some very mild impingement noted.  Mild positive crossover and mild positive O'Brien's.  Rotator cuff strength though is intact.  Mild discomfort to palpation in the posterior glenohumeral region  Low back exam still has significant loss of lordosis with some very mild degenerative scoliosis.  Tightness of the hamstrings bilaterally.  Mild discomfort with right greater than left   Osteopathic findings  T6 extended rotated and side bent right L2 flexed rotated and side  bent right Sacrum right on right       Assessment and Plan:  Anterior subluxation of left shoulder Patient is having some mild increase in discomfort of the left shoulder. Patient has had known acromioclavicular arthritis. Worsening pain will consider injections again. Last injection  was nearly 2 years ago. Discussed icing regimen and home exercises. Follow-up again in 4 to 6 weeks  Spinal stenosis of lumbar region with radiculopathy Significant overall but is doing relatively well. Patient's MRI in 2011 showed significant spinal stenosis progression has shown that patient did have severe spinal stenosis in 2017. Patient has had epidurals last 1 no in January 2021. Patient is on minimal medications and is able to play golf on a regular basis. Patient is trying to avoid any surgical intervention still at this time. Responding fairly well to osteopathic manipulation. Follow-up again in 4 to 6 weeks worsening pain can consider repeating epidural once again because it is almost 84 years old repeating MRI    Nonallopathic problems  Decision today to treat with OMT was based on Physical Exam  After verbal consent patient was treated with HVLA, ME, FPR techniques in  thoracic, lumbar, and sacral  areas  Patient tolerated the procedure well with improvement in symptoms  Patient given exercises, stretches and lifestyle modifications  See medications in patient instructions if given  Patient will follow up in 4-8 weeks      The above documentation has been reviewed and is accurate and complete Lyndal Pulley, DO       Note: This dictation was prepared with Dragon dictation along with smaller phrase technology. Any transcriptional errors that result from this process are unintentional.

## 2020-05-04 ENCOUNTER — Encounter: Payer: Self-pay | Admitting: Family Medicine

## 2020-05-04 ENCOUNTER — Other Ambulatory Visit: Payer: Self-pay

## 2020-05-04 ENCOUNTER — Ambulatory Visit (INDEPENDENT_AMBULATORY_CARE_PROVIDER_SITE_OTHER): Payer: Medicare PPO

## 2020-05-04 ENCOUNTER — Ambulatory Visit: Payer: Medicare PPO | Admitting: Family Medicine

## 2020-05-04 VITALS — BP 132/84 | HR 67 | Ht 69.0 in | Wt 194.0 lb

## 2020-05-04 DIAGNOSIS — M999 Biomechanical lesion, unspecified: Secondary | ICD-10-CM | POA: Diagnosis not present

## 2020-05-04 DIAGNOSIS — M48061 Spinal stenosis, lumbar region without neurogenic claudication: Secondary | ICD-10-CM

## 2020-05-04 DIAGNOSIS — G8929 Other chronic pain: Secondary | ICD-10-CM | POA: Diagnosis not present

## 2020-05-04 DIAGNOSIS — M7582 Other shoulder lesions, left shoulder: Secondary | ICD-10-CM

## 2020-05-04 DIAGNOSIS — S43012D Anterior subluxation of left humerus, subsequent encounter: Secondary | ICD-10-CM

## 2020-05-04 DIAGNOSIS — M5416 Radiculopathy, lumbar region: Secondary | ICD-10-CM | POA: Diagnosis not present

## 2020-05-04 DIAGNOSIS — M25512 Pain in left shoulder: Secondary | ICD-10-CM

## 2020-05-04 DIAGNOSIS — M19012 Primary osteoarthritis, left shoulder: Secondary | ICD-10-CM | POA: Diagnosis not present

## 2020-05-04 NOTE — Patient Instructions (Signed)
Good to see you Shoulder exercises and xray Back is decent See me again in 4-6 weeks

## 2020-05-04 NOTE — Assessment & Plan Note (Signed)
Patient is having some mild increase in discomfort of the left shoulder. Patient has had known acromioclavicular arthritis. Worsening pain will consider injections again. Last injection was nearly 2 years ago. Discussed icing regimen and home exercises. Follow-up again in 4 to 6 weeks

## 2020-05-04 NOTE — Assessment & Plan Note (Signed)
Significant overall but is doing relatively well. Patient's MRI in 2011 showed significant spinal stenosis progression has shown that patient did have severe spinal stenosis in 2017. Patient has had epidurals last 1 no in January 2021. Patient is on minimal medications and is able to play golf on a regular basis. Patient is trying to avoid any surgical intervention still at this time. Responding fairly well to osteopathic manipulation. Follow-up again in 4 to 6 weeks worsening pain can consider repeating epidural once again because it is almost 86 years old repeating MRI

## 2020-06-05 DIAGNOSIS — H43812 Vitreous degeneration, left eye: Secondary | ICD-10-CM | POA: Diagnosis not present

## 2020-06-05 DIAGNOSIS — Z961 Presence of intraocular lens: Secondary | ICD-10-CM | POA: Diagnosis not present

## 2020-06-05 DIAGNOSIS — H35013 Changes in retinal vascular appearance, bilateral: Secondary | ICD-10-CM | POA: Diagnosis not present

## 2020-06-05 DIAGNOSIS — H35033 Hypertensive retinopathy, bilateral: Secondary | ICD-10-CM | POA: Diagnosis not present

## 2020-06-05 DIAGNOSIS — H353131 Nonexudative age-related macular degeneration, bilateral, early dry stage: Secondary | ICD-10-CM | POA: Diagnosis not present

## 2020-06-05 DIAGNOSIS — H35363 Drusen (degenerative) of macula, bilateral: Secondary | ICD-10-CM | POA: Diagnosis not present

## 2020-06-13 NOTE — Progress Notes (Signed)
[place] Phone: [phone] Subjective:   [name], am serving as a scribe for Dr. Hulan Saas. This visit occurred during the SARS-CoV-2 public health emergency.  Safety protocols were in place, including screening questions prior to the visit, additional usage of staff PPE, and extensive cleaning of exam room while observing appropriate contact time as indicated for disinfecting solutions.   I'm seeing this patient by the request  of:  [name], MD  CC: low back pain follow up   [id number]   05/04/2020 Patient is having some mild increase in discomfort of the left shoulder. Patient has had known acromioclavicular arthritis. Worsening pain will consider injections again. Last injection was nearly 2 years ago. Discussed icing regimen and home exercises. Follow-up again in 4 to 6 weeks  Update 06/13/2020 Encarnacion Scioneaux is a 86 y.o. male coming in with complaint of back pain. OMT 03/04/2020. Patient states that his left hand pain is worsening. Considered an injection today but he is going to play golf after the visit.   Medications patient has been prescribed:   Taking:         Reviewed prior external information including notes and imaging from previsou exam, outside providers and external EMR if available.   As well as notes that were available from care everywhere and other healthcare systems.  Past medical history, social, surgical and family history all reviewed in electronic medical record.  No pertanent information unless stated regarding to the chief complaint.   Past Medical History:  Diagnosis Date  . Cataract   . GERD (gastroesophageal reflux disease)   . Hiatal hernia   . Hypertension     Allergies  Allergen Reactions  . Ace Inhibitors Swelling    After taking for long time had (facial) angioedema on lisinopril     Review of Systems:  No headache, visual changes, nausea,  vomiting, diarrhea, constipation, dizziness, abdominal pain, skin rash, fevers, chills, night sweats, weight loss, swollen lymph nodes,  joint swelling, chest pain, shortness of breath, mood changes. POSITIVE muscle aches, body aches   Objective  Blood pressure 112/82, pulse 60, height 5' 9" (1.753 m), weight 193 lb (87.5 kg), SpO2 98 %.   General: No apparent distress alert and oriented x3 mood and affect normal, dressed appropriately.  HEENT: Pupils equal, extraocular movements intact  Respiratory: Patient's speak in full sentences and does not appear short of breath  Cardiovascular: No lower extremity edema, non tender, no erythema  Gait normal with good balance and coordination.  MSK:  Arthritic changes of multiple joints  Left hand does still have swelling noted over the [facility] joint as well as the TFCC. Back -low back exam does have some loss of lordosis.  Some increasing tightness noted in the thoracolumbar juncture more than usual.  Seems to be more right greater than left.  Patient does have tightness with straight leg test some tightness of the hamstrings bilaterally.  Still lacks the last 10 degrees of extension of the back.  Neurovascularly intact distally.  Osteopathic findings  T5 extended rotated and side bent left L1 flexed rotated and side bent right Sacrum right on right       Assessment and Plan:  Spinal stenosis of lumbar region with radiculopathy Multifactorial.  Chronic problem with mild exacerbation.  Patient has done well with the osteopathic manipulation, tai chi, and is taking very minimal medications.  Patient at this point is having more  medications for his blood pressure than anything else at the time.  Patient is following up with his primary care provider for the hypertension.  Seems to be well-controlled we discussed the potential for any type of lightheadedness to decrease the felodipine otherwise regarding his back follow-up again in 6 weeks.     Nonallopathic problems  Decision today to treat with OMT was based on Physical Exam  After verbal consent patient was treated with HVLA, ME, FPR techniques in  thoracic, lumbar, and sacral  areas  Patient tolerated the procedure well with improvement in symptoms  Patient given exercises, stretches and lifestyle modifications  See medications in patient instructions if given  Patient will follow up in 6 weeks      The above documentation has been reviewed and is accurate and complete Lyndal Pulley, DO       Note: This dictation was prepared with Dragon dictation along with smaller phrase technology. Any transcriptional errors that result from this process are unintentional.

## 2020-06-14 ENCOUNTER — Ambulatory Visit: Payer: Medicare PPO | Admitting: Family Medicine

## 2020-06-14 ENCOUNTER — Other Ambulatory Visit: Payer: Self-pay

## 2020-06-14 ENCOUNTER — Encounter: Payer: Self-pay | Admitting: Family Medicine

## 2020-06-14 VITALS — BP 112/82 | HR 60 | Ht 69.0 in | Wt 193.0 lb

## 2020-06-14 DIAGNOSIS — M48061 Spinal stenosis, lumbar region without neurogenic claudication: Secondary | ICD-10-CM | POA: Diagnosis not present

## 2020-06-14 DIAGNOSIS — M5416 Radiculopathy, lumbar region: Secondary | ICD-10-CM

## 2020-06-14 DIAGNOSIS — M999 Biomechanical lesion, unspecified: Secondary | ICD-10-CM | POA: Diagnosis not present

## 2020-06-14 NOTE — Patient Instructions (Signed)
If you shoot 69 I'm taking full credit See me in 6 weeks near end of the day for wrist injection

## 2020-06-14 NOTE — Assessment & Plan Note (Addendum)
Multifactorial.  Chronic problem with mild exacerbation.  Patient has done well with the osteopathic manipulation, tai chi, and is taking very minimal medications.  regarding his back follow-up again in 6 weeks.

## 2020-06-25 ENCOUNTER — Other Ambulatory Visit: Payer: Self-pay | Admitting: Family Medicine

## 2020-06-26 ENCOUNTER — Other Ambulatory Visit: Payer: Self-pay | Admitting: Family Medicine

## 2020-06-28 ENCOUNTER — Other Ambulatory Visit: Payer: Self-pay | Admitting: Internal Medicine

## 2020-07-25 ENCOUNTER — Ambulatory Visit: Payer: Medicare PPO | Admitting: Family Medicine

## 2020-07-25 ENCOUNTER — Other Ambulatory Visit: Payer: Self-pay

## 2020-07-25 ENCOUNTER — Encounter: Payer: Self-pay | Admitting: Family Medicine

## 2020-07-25 VITALS — HR 72 | Ht 69.0 in | Wt 189.2 lb

## 2020-07-25 DIAGNOSIS — I10 Essential (primary) hypertension: Secondary | ICD-10-CM | POA: Diagnosis not present

## 2020-07-25 DIAGNOSIS — E785 Hyperlipidemia, unspecified: Secondary | ICD-10-CM | POA: Diagnosis not present

## 2020-07-25 DIAGNOSIS — N1832 Chronic kidney disease, stage 3b: Secondary | ICD-10-CM | POA: Diagnosis not present

## 2020-07-25 DIAGNOSIS — Z0000 Encounter for general adult medical examination without abnormal findings: Secondary | ICD-10-CM

## 2020-07-25 MED ORDER — MIRABEGRON ER 25 MG PO TB24
25.0000 mg | ORAL_TABLET | Freq: Every day | ORAL | 3 refills | Status: DC
Start: 2020-07-25 — End: 2020-07-25

## 2020-07-25 MED ORDER — ZOSTER VAC RECOMB ADJUVANTED 50 MCG/0.5ML IM SUSR: INTRAMUSCULAR | 1 refills | Status: AC

## 2020-07-25 MED ORDER — MIRABEGRON ER 25 MG PO TB24
25.0000 mg | ORAL_TABLET | Freq: Every day | ORAL | 3 refills | Status: DC
Start: 2020-07-25 — End: 2021-08-16

## 2020-07-25 NOTE — Patient Instructions (Signed)
I will send you a note about your blood work. I have also given you a Rx for the shingles shot. It was great to see you!

## 2020-07-25 NOTE — Progress Notes (Signed)
[name] [address] [place] [address] Phone: [phone] Subjective:   [name], am serving as a scribe for Dr. Antoine Primas. This visit occurred during the SARS-CoV-2 public health emergency.  Safety protocols were in place, including screening questions prior to the visit, additional usage of staff PPE, and extensive cleaning of exam room while observing appropriate contact time as indicated for disinfecting solutions.   I'm seeing this patient by the request  of:  [name], MD  CC: Back pain follow-up  [id number]  Paul [name] is a 86 y.o. male coming in with complaint of back and neck pain. OMT 06/14/2020. Patient states that his left [facility] joint pain. Using 500mg  of tylenol which is helping his pain.   Medications patient has been prescribed: None          Reviewed prior external information including notes and imaging from previsou exam, outside providers and external EMR if available.   As well as notes that were available from care everywhere and other healthcare systems.  Past medical history, social, surgical and family history all reviewed in electronic medical record.  No pertanent information unless stated regarding to the chief complaint.   Past Medical History:  Diagnosis Date  . Cataract   . GERD (gastroesophageal reflux disease)   . Hiatal hernia   . Hypertension     Allergies  Allergen Reactions  . Ace Inhibitors Swelling    After taking for long time had (facial) angioedema on lisinopril     Review of Systems:  No headache, visual changes, nausea, vomiting, diarrhea, constipation, dizziness, abdominal pain, skin rash, fevers, chills, night sweats, weight loss, swollen lymph nodes, body aches, joint swelling, chest pain, shortness of breath, mood changes. POSITIVE muscle aches  Objective  Blood pressure 120/80, pulse 70, height 5\' 9"  (1.753 m), weight 189 lb (85.7 kg), SpO2 98 %.   General: No  apparent distress alert and oriented x3 mood and affect normal, dressed appropriately.  HEENT: Pupils equal, extraocular movements intact  Respiratory: Patient's speak in full sentences and does not appear short of breath  Cardiovascular: No lower extremity edema, non tender, no erythema   Gait normal with good balance and coordination.  MSK: Left wrist exam shows the patient does have swelling over the dorsal aspect.  Seems to be more over the TFCC area.  Patient does have some tenderness in this area.  Mild positive CMC grind test noted as well. Back -back exam does have significant loss of lordosis.  Tightness with FABER test right greater than left.  Negative straight leg test.  Patient does have limited range of motion in all planes extension greater than 10 degrees of the back.  Osteopathic findings   T6 extended rotated and side bent left L2 flexed rotated and side bent right Sacrum right on right  Procedure: Real-time Ultrasound Guided Injection of left TFCC Device: GE Logiq Q7 Ultrasound guided injection is preferred based studies that show increased duration, increased effect, greater accuracy, decreased procedural pain, increased response rate, and decreased cost with ultrasound guided versus blind injection.  Verbal informed consent obtained.  Time-out conducted.  Noted no overlying erythema, induration, or other signs of local infection.  Skin prepped in a sterile fashion.  Local anesthesia: Topical Ethyl chloride.  With sterile technique and under real time ultrasound guidance: With a 25-gauge half inch needle injected with 0.5 cc of 0.5% Marcaine and 0.5 cc of Kenalog 40 mg/mL did have some  difficulty with pushing the material and likely secondary to the needle having difficulty. Completed without difficulty  Pain immediately resolved suggesting accurate placement of the medication.  Advised to call if fevers/chills, erythema, induration, drainage, or persistent bleeding.   Impression: Technically successful ultrasound guided injection.     Assessment and Plan:  Wrist pain, chronic, left Attempted injection but had significant difficulty with the needle.  Only got half of the medication in.  Discussed with patient I would like to see if this would help but will need to consider the possibility of repeating again at follow-up.  Patient is in agreement with the plan.  Patient knows if any redness or any inflammation to seek medical attention but likely will do well.  Spinal stenosis of lumbar region with radiculopathy Chronic and multifactorial but doing relatively well.  Discussed with patient that we can always consider the possibility of another epidural if needed.  Patient though is responding well to manipulation.  Follow-up again in 4 weeks.    Nonallopathic problems  Decision today to treat with OMT was based on Physical Exam  After verbal consent patient was treated with HVLA, ME, FPR techniques in thoracic, lumbar, and sacral  areas  Patient tolerated the procedure well with improvement in symptoms  Patient given exercises, stretches and lifestyle modifications  See medications in patient instructions if given  Patient will follow up in 6 weeks      The above documentation has been reviewed and is accurate and complete [name], DO       Note: This dictation was prepared with Dragon dictation along with smaller phrase technology. Any transcriptional errors that result from this process are unintentional.

## 2020-07-26 ENCOUNTER — Ambulatory Visit: Payer: Self-pay

## 2020-07-26 ENCOUNTER — Encounter: Payer: Self-pay | Admitting: Family Medicine

## 2020-07-26 ENCOUNTER — Ambulatory Visit: Payer: Medicare PPO | Admitting: Family Medicine

## 2020-07-26 VITALS — BP 120/80 | HR 70 | Ht 69.0 in | Wt 189.0 lb

## 2020-07-26 DIAGNOSIS — G8929 Other chronic pain: Secondary | ICD-10-CM | POA: Diagnosis not present

## 2020-07-26 DIAGNOSIS — M5416 Radiculopathy, lumbar region: Secondary | ICD-10-CM

## 2020-07-26 DIAGNOSIS — M999 Biomechanical lesion, unspecified: Secondary | ICD-10-CM

## 2020-07-26 DIAGNOSIS — M48061 Spinal stenosis, lumbar region without neurogenic claudication: Secondary | ICD-10-CM

## 2020-07-26 DIAGNOSIS — M25532 Pain in left wrist: Secondary | ICD-10-CM

## 2020-07-26 LAB — COMPREHENSIVE METABOLIC PANEL
ALT: 18 IU/L (ref 0–44)
AST: 27 IU/L (ref 0–40)
Albumin/Globulin Ratio: 1.6 (ref 1.2–2.2)
Albumin: 4.4 g/dL (ref 3.6–4.6)
Alkaline Phosphatase: 62 IU/L (ref 44–121)
BUN/Creatinine Ratio: 19 (ref 10–24)
BUN: 30 mg/dL — ABNORMAL HIGH (ref 8–27)
Bilirubin Total: 0.6 mg/dL (ref 0.0–1.2)
CO2: 26 mmol/L (ref 20–29)
Calcium: 9.5 mg/dL (ref 8.6–10.2)
Chloride: 96 mmol/L (ref 96–106)
Creatinine, Ser: 1.57 mg/dL — ABNORMAL HIGH (ref 0.76–1.27)
Globulin, Total: 2.7 g/dL (ref 1.5–4.5)
Glucose: 94 mg/dL (ref 65–99)
Potassium: 3.4 mmol/L — ABNORMAL LOW (ref 3.5–5.2)
Sodium: 139 mmol/L (ref 134–144)
Total Protein: 7.1 g/dL (ref 6.0–8.5)
eGFR: 43 mL/min/{1.73_m2} — ABNORMAL LOW (ref >59)

## 2020-07-26 LAB — LIPID PANEL
Chol/HDL Ratio: 2.7 ratio (ref 0.0–5.0)
Cholesterol, Total: 194 mg/dL (ref 100–199)
HDL: 71 mg/dL (ref >39)
LDL Chol Calc (NIH): 115 mg/dL — ABNORMAL HIGH (ref 0–99)
Triglycerides: 42 mg/dL (ref 0–149)
VLDL Cholesterol Cal: 8 mg/dL (ref 5–40)

## 2020-07-26 LAB — PSA: Prostate Specific Ag, Serum: 4 ng/mL (ref 0.0–4.0)

## 2020-07-26 NOTE — Progress Notes (Signed)
    CHIEF COMPLAINT / HPI: Here for checkup, healthcare maintenance.  Feeling well.  Just had a skin cancer removed on his right leg by the dermatologist and that is healing well.  Wants to get a prescription for the shingles shot.  Continues to be active especially with golf.  He occasionally will have some left or right sided muscular type chest pains.  Anterior chest.  Rarely occurs on both sides.  Worsened by certain activities such as reaching behind him.  Notices it after he has been playing fetch with his dog, give it.  1-2 out of 10.  Brief.  Never occurs with exertion and no other associated symptoms.  Has been monitoring his blood pressures and they seem to be much better controlled recently.  He is not sure why but he is happy about that.  He is taking all of his medicines regularly without problem.   PERTINENT  PMH / PSH: I have reviewed the patient's medications, allergies, past medical and surgical history, smoking status and updated in the EMR as appropriate.   OBJECTIVE:  Pulse 72   Ht 5\' 9"  (1.753 m)   Wt 189 lb 3.2 oz (85.8 kg)   SpO2 99%   BMI 27.94 kg/m  Vital signs reviewed. GENERAL: Well-developed, well-nourished, no acute distress. CARDIOVASCULAR: Regular rate and rhythm no murmur gallop or rub LUNGS: Clear to auscultation bilaterally, no rales or wheeze. ABDOMEN: Soft positive bowel sounds NEURO: No gross focal neurological deficits. MSK: Movement of extremity x 4.  There is on the chest where he is talking about having some intermittent pain in his right over the origin of the pectoralis minor muscle and deep palpation here reproduces his pain. SKIN: Healing site on the right thigh from large punch biopsy.  No sign of infection. PSYCH: AxOx4. Good eye contact.. No psychomotor retardation or agitation. Appropriate speech fluency and content. Asks and answers questions appropriately. Mood is congruent.    ASSESSMENT / PLAN: muscular chest pain : no indication this  is cardiac but we reviewed red flags and he will monitor. Seems like pec minor muscle strain.  HYPERTENSION, BENIGN SYSTEMIC Good control. Labs. Continue current meds  Hyperlipidemia Continue current meds  Well adult exam shingrix Rx given. Labs. Health maintenance up to date   MD

## 2020-07-26 NOTE — Assessment & Plan Note (Signed)
Continue current meds 

## 2020-07-26 NOTE — Assessment & Plan Note (Signed)
Attempted injection but had significant difficulty with the needle.  Only got half of the medication in.  Discussed with patient I would like to see if this would help but will need to consider the possibility of repeating again at follow-up.  Patient is in agreement with the plan.  Patient knows if any redness or any inflammation to seek medical attention but likely will do well.

## 2020-07-26 NOTE — Assessment & Plan Note (Signed)
Good control Labs Continue current meds 

## 2020-07-26 NOTE — Assessment & Plan Note (Signed)
shingrix Rx given. Labs. Health maintenance up to date

## 2020-07-26 NOTE — Assessment & Plan Note (Signed)
Chronic and multifactorial but doing relatively well.  Discussed with patient that we can always consider the possibility of another epidural if needed.  Patient though is responding well to manipulation.  Follow-up again in 4 weeks.

## 2020-07-26 NOTE — Patient Instructions (Signed)
Injected wrist today See me again in 4 weeks

## 2020-07-28 ENCOUNTER — Encounter: Payer: Self-pay | Admitting: Family Medicine

## 2020-08-01 ENCOUNTER — Ambulatory Visit: Payer: Medicare PPO | Admitting: Internal Medicine

## 2020-08-21 ENCOUNTER — Other Ambulatory Visit: Payer: Self-pay | Admitting: Family Medicine

## 2020-08-23 ENCOUNTER — Ambulatory Visit: Payer: Medicare PPO | Admitting: Family Medicine

## 2020-08-27 ENCOUNTER — Ambulatory Visit: Payer: Medicare PPO | Admitting: Internal Medicine

## 2020-08-27 ENCOUNTER — Encounter: Payer: Self-pay | Admitting: Internal Medicine

## 2020-08-27 VITALS — BP 124/60 | HR 85 | Ht 69.0 in | Wt 190.0 lb

## 2020-08-27 DIAGNOSIS — K2100 Gastro-esophageal reflux disease with esophagitis, without bleeding: Secondary | ICD-10-CM

## 2020-08-27 DIAGNOSIS — K5909 Other constipation: Secondary | ICD-10-CM | POA: Diagnosis not present

## 2020-08-27 DIAGNOSIS — K449 Diaphragmatic hernia without obstruction or gangrene: Secondary | ICD-10-CM

## 2020-08-27 NOTE — Patient Instructions (Signed)
You have been scheduled for an endoscopy. Please follow written instructions given to you at your visit today. If you use inhalers (even only as needed), please bring them with you on the day of your procedure.  We have sent the following medications to your pharmacy for you to pick up at your convenience: Pantoprazole 40 mg twice daily  Continue Miralax once daily  If you are age 84 or older, your body mass index should be between 23-30. Your Body mass index is 28.06 kg/m. If this is out of the aforementioned range listed, please consider follow up with your Primary Care Provider.  If you are age 39 or younger, your body mass index should be between 19-25. Your Body mass index is 28.06 kg/m. If this is out of the aformentioned range listed, please consider follow up with your Primary Care Provider.   Due to recent changes in healthcare laws, you may see the results of your imaging and laboratory studies on MyChart before your provider has had a chance to review them.  We understand that in some cases there may be results that are confusing or concerning to you. Not all laboratory results come back in the same time frame and the provider may be waiting for multiple results in order to interpret others.  Please give [place] 48 hours in order for your provider to thoroughly review all the results before contacting the office for clarification of your results.

## 2020-08-27 NOTE — Progress Notes (Signed)
   Subjective:    Patient ID: Paul [name], male    DOB: 1934-11-16, 86 y.o.   MRN: [id number]  HPI Paul [name] is an 86 year old male with a history of GERD with reflux esophagitis, large hiatal hernia, chronic constipation who is here for follow-up.  He is seen in person today and alone.  He was last seen via virtual visit in May 2020.  He has been consistent with his pantoprazole 40 mg at least daily and usually twice daily.  He has had several episodes of breakthrough heartburn with regurgitation into the back of his throat and even through his nose.  This happened much recently after eating nachos and having margarita and ice cream.  He noted this meal was particularly later in the evening and he woke up with severe regurgitation.  He has an adjustable bed and he has raised the head of his bed to help prevent reflux symptoms.  He is also noticed a 7 to 8 pound weight gain.  He has had several episodes of epigastric discomfort which also usually goes with his reflux.  Good appetite.  No dysphagia symptom.  Bowel movements regular without blood or melena.  He does use MiraLAX 17 g daily which works well for him.   Review of Systems As per HPI, otherwise negative  Current Medications, Allergies, Past Medical History, Past Surgical History, Family History and Social History were reviewed in [employer] record.     Objective:   Physical Exam BP 124/60   Pulse 85   Ht 5\' 9"  (1.753 m)   Wt 190 lb (86.2 kg)   BMI 28.06 kg/m  Gen: awake, alert, NAD HEENT: anicteric CV: RRR, no mrg Abd: soft, NT/ND, +BS throughout Ext: no c/c/e Neuro: nonfocal      Assessment & Plan:  86 year old male with a history of GERD with reflux esophagitis, large hiatal hernia, chronic constipation who is here for follow-up.   1.  GERD with history of reflux esophagitis and hiatal hernia --he has persistent reflux and regurgitation symptoms despite pantoprazole 40 mg normally twice daily.   He also does have concern about esophageal cancer given his history of esophagitis.  He has not had Barrett's esophagus.  His hiatal hernia definitively worsens his reflux symptoms.  He would like to proceed with another upper endoscopy -- We will continue pantoprazole 40 mg twice daily AC -- Upper endoscopy in the [facility]; we reviewed the risk, benefits and alternatives and he is agreeable and wishes to proceed -- GERD diet and lifestyle modifications including avoiding eating late at night, propping the head of his bed on a consistent basis and avoiding trigger foods like caffeine, alcohol and acidic foods  2.  Chronic constipation --responsive to MiraLAX, continue 17 g MiraLAX daily  3.  Colon cancer screening --negative Cologuard in 2018.  We discussed repeating Cologuard or even colonoscopy at this time.  He reports that he does not have concern regarding his colon risk as he has had regular colonoscopies throughout his lifetime and has never had colon polyps.  He wishes to defer screening at this time and in the future.  Based on his age this is reasonable comfortable with his decision.

## 2020-08-30 ENCOUNTER — Encounter: Payer: Self-pay | Admitting: Internal Medicine

## 2020-09-03 ENCOUNTER — Ambulatory Visit (AMBULATORY_SURGERY_CENTER): Payer: Medicare PPO | Admitting: Internal Medicine

## 2020-09-03 ENCOUNTER — Encounter: Payer: Self-pay | Admitting: Internal Medicine

## 2020-09-03 ENCOUNTER — Other Ambulatory Visit: Payer: Self-pay

## 2020-09-03 VITALS — BP 138/78 | HR 48 | Temp 97.5°F | Resp 17 | Ht 69.0 in | Wt 190.0 lb

## 2020-09-03 DIAGNOSIS — K2100 Gastro-esophageal reflux disease with esophagitis, without bleeding: Secondary | ICD-10-CM

## 2020-09-03 DIAGNOSIS — Z8719 Personal history of other diseases of the digestive system: Secondary | ICD-10-CM

## 2020-09-03 DIAGNOSIS — K219 Gastro-esophageal reflux disease without esophagitis: Secondary | ICD-10-CM | POA: Diagnosis not present

## 2020-09-03 DIAGNOSIS — I10 Essential (primary) hypertension: Secondary | ICD-10-CM | POA: Diagnosis not present

## 2020-09-03 MED ORDER — SODIUM CHLORIDE 0.9 % IV SOLN: 500.0000 mL | Freq: Once | INTRAVENOUS | Status: DC

## 2020-09-03 NOTE — Progress Notes (Signed)
[name] [address] [place] [address] Phone: [phone] Subjective:   I Paul [name] am serving as a Neurosurgeon for Dr. Antoine Primas.  This visit occurred during the SARS-CoV-2 public health emergency.  Safety protocols were in place, including screening questions prior to the visit, additional usage of staff PPE, and extensive cleaning of exam room while observing appropriate contact time as indicated for disinfecting solutions.   I'm seeing this patient by the request  of:  [name], MD  CC:   [id number]   07/26/2020 Attempted injection but had significant difficulty with the needle.  Only got half of the medication in.  Discussed with patient I would like to see if this would help but will need to consider the possibility of repeating again at follow-up.  Patient is in agreement with the plan.  Patient knows if any redness or any inflammation to seek medical attention but likely will do well  Update 09/04/2020 Paul [name] is a 86 y.o. male coming in with complaint of back and neck pain. OMT and L wrist pain f/u. Patient states he is doing ok. Wrist is making some progress.   Medications patient has been prescribed: None  Taking:         Reviewed prior external information including notes and imaging from previsou exam, outside providers and external EMR if available.   As well as notes that were available from care everywhere and other healthcare systems.  Past medical history, social, surgical and family history all reviewed in electronic medical record.  No pertanent information unless stated regarding to the chief complaint.   Past Medical History:  Diagnosis Date  . Cataract   . GERD (gastroesophageal reflux disease)   . Hiatal hernia   . Hypertension     Allergies  Allergen Reactions  . Ace Inhibitors Swelling    After taking for long time had (facial) angioedema on lisinopril     Review of Systems:  No  headache, visual changes, nausea, vomiting, diarrhea, constipation, dizziness, abdominal pain, skin rash, fevers, chills, night sweats, weight loss, swollen lymph nodes, body aches, joint swelling, chest pain, shortness of breath, mood changes. POSITIVE muscle aches  Objective  Blood pressure 140/80, pulse (!) 53, height 5\' 9"  (1.753 m), weight 187 lb (84.8 kg), SpO2 99 %.   General: No apparent distress alert and oriented x3 mood and affect normal, dressed appropriately.  HEENT: Pupils equal, extraocular movements intact  Respiratory: Patient's speak in full sentences and does not appear short of breath  Cardiovascular: No lower extremity edema, non tender, no erythema  Gait normal with good balance and coordination.  MSK: Significant arthritic changes of multiple joints Back -significant tightness noted in the lumbar spine.  Patient does have some limited left-sided rotation of the spine.  Tightness of the hip flexors bilaterally.  Patient has a negative straight leg test.  Lacks more than 5 degrees of extension of the back at the moment.  Neurovascular intact distally  Osteopathic findings  T9 extended rotated and side bent left L2 flexed rotated and side bent right Sacrum right on right       Assessment and Plan:  Spinal stenosis of lumbar region with radiculopathy Patient is complaining a little bit more of his golf swing recently.  I think this is secondary to more of a neuropathy aspect.  We need to consider the possibility of another epidural at some point in the near future.  Patient would like to  continue with conservative therapy.  Responding relatively well though to osteopathic manipulation.  Follow-up with me again in 6 to 8 weeks  Wrist pain, chronic, left Attempted injection previously.  We will consider a repeat at follow-up.  Has been quite some time.  He does have the widening of the scapholunate and the TFCC tearing.  Still able to golf most days of the week.     Nonallopathic problems  Decision today to treat with OMT was based on Physical Exam  After verbal consent patient was treated with HVLA, ME, FPR techniques in  thoracic, lumbar, and sacral  areas  Patient tolerated the procedure well with improvement in symptoms  Patient given exercises, stretches and lifestyle modifications  See medications in patient instructions if given  Patient will follow up in 4-8 weeks     The above documentation has been reviewed and is accurate and complete [name], DO       Note: This dictation was prepared with Dragon dictation along with smaller phrase technology. Any transcriptional errors that result from this process are unintentional.

## 2020-09-03 NOTE — Patient Instructions (Signed)

## 2020-09-03 NOTE — Op Note (Signed)
[facility] Patient Name: Paul [name] Procedure Date: 09/03/2020 9:49 AM MRN: [id number] Endoscopist: Beverley Fiedler , MD Age: 86 Referring MD:  Date of Birth: Oct 08, 1934 Gender: Male Account #: [id number] Procedure:                Upper GI endoscopy Indications:              Reflux with history of esophagitis, last EGD Dec                            2019 Medicines:                Monitored Anesthesia Care Procedure:                Pre-Anesthesia Assessment:                           - Prior to the procedure, a History and Physical                            was performed, and patient medications and                            allergies were reviewed. The patient's tolerance of                            previous anesthesia was also reviewed. The risks                            and benefits of the procedure and the sedation                            options and risks were discussed with the patient.                            All questions were answered, and informed consent                            was obtained. Prior Anticoagulants: The patient has                            taken no previous anticoagulant or antiplatelet                            agents. ASA Grade Assessment: II - A patient with                            mild systemic disease. After reviewing the risks                            and benefits, the patient was deemed in                            satisfactory condition to undergo the procedure.  After obtaining informed consent, the endoscope was                            passed under direct vision. Throughout the                            procedure, the patient's blood pressure, pulse, and                            oxygen saturations were monitored continuously. The                            Endoscope was introduced through the mouth, and                            advanced to the second part of duodenum. The upper                             GI endoscopy was accomplished without difficulty.                            The patient tolerated the procedure well. Scope In: Scope Out: Findings:                 Normal mucosa was found in the entire esophagus.                            The Z-line is regular at 38 cm.                           A 4 cm hiatal hernia was present.                           The gastroesophageal flap valve was visualized                            endoscopically and classified as Hill Grade IV (no                            fold, wide open lumen, hiatal hernia present).                           The entire examined stomach was normal.                           The examined duodenum was normal. Complications:            No immediate complications. Estimated Blood Loss:     Estimated blood loss: none. Impression:               - Normal mucosa was found in the entire esophagus.                            No evidence of reflux esophagitis today.                           -  4 cm hiatal hernia.                           - Normal stomach.                           - Normal examined duodenum.                           - No specimens collected. Recommendation:           - Patient has a contact number available for                            emergencies. The signs and symptoms of potential                            delayed complications were discussed with the                            patient. Return to normal activities tomorrow.                            Written discharge instructions were provided to the                            patient.                           - Resume previous diet.                           - Continue present medications. Current GERD                            therapy is working well, continue current                            medication. Beverley Fiedler, MD 09/03/2020 10:05:27 AM This report has been signed electronically.

## 2020-09-03 NOTE — Progress Notes (Signed)
Report given to PACU, vss 

## 2020-09-03 NOTE — Progress Notes (Signed)
VS by CW  I have reviewed the patient's medical history in detail and updated the computerized patient record.  

## 2020-09-03 NOTE — Progress Notes (Signed)
0949 Robinul 0.1 mg IV given due large amount of secretions upon assessment.  MD made aware, vss  

## 2020-09-04 ENCOUNTER — Ambulatory Visit: Payer: Medicare PPO | Admitting: Family Medicine

## 2020-09-04 ENCOUNTER — Encounter: Payer: Self-pay | Admitting: Family Medicine

## 2020-09-04 VITALS — BP 140/80 | HR 53 | Ht 69.0 in | Wt 187.0 lb

## 2020-09-04 DIAGNOSIS — G8929 Other chronic pain: Secondary | ICD-10-CM

## 2020-09-04 DIAGNOSIS — M9903 Segmental and somatic dysfunction of lumbar region: Secondary | ICD-10-CM

## 2020-09-04 DIAGNOSIS — M25532 Pain in left wrist: Secondary | ICD-10-CM

## 2020-09-04 DIAGNOSIS — M9902 Segmental and somatic dysfunction of thoracic region: Secondary | ICD-10-CM | POA: Diagnosis not present

## 2020-09-04 DIAGNOSIS — M9904 Segmental and somatic dysfunction of sacral region: Secondary | ICD-10-CM

## 2020-09-04 DIAGNOSIS — M5416 Radiculopathy, lumbar region: Secondary | ICD-10-CM | POA: Diagnosis not present

## 2020-09-04 DIAGNOSIS — M48061 Spinal stenosis, lumbar region without neurogenic claudication: Secondary | ICD-10-CM

## 2020-09-04 NOTE — Assessment & Plan Note (Signed)
Attempted injection previously.  We will consider a repeat at follow-up.  Has been quite some time.  He does have the widening of the scapholunate and the TFCC tearing.  Still able to golf most days of the week.

## 2020-09-04 NOTE — Patient Instructions (Addendum)
Good to see you Ankle exercises Watch the neuropathy and the wrist See me again in 5-6 weeks

## 2020-09-04 NOTE — Assessment & Plan Note (Signed)
Patient is complaining a little bit more of his golf swing recently.  I think this is secondary to more of a neuropathy aspect.  We need to consider the possibility of another epidural at some point in the near future.  Patient would like to continue with conservative therapy.  Responding relatively well though to osteopathic manipulation.  Follow-up with me again in 6 to 8 weeks

## 2020-09-05 ENCOUNTER — Telehealth: Payer: Self-pay

## 2020-09-05 NOTE — Telephone Encounter (Signed)
  Follow up Call-  Call back number 09/03/2020 03/30/2018  Post procedure Call Back phone  # [phone]  Permission to leave phone message Yes Yes  Some recent data might be hidden     Patient questions:  Do you have a fever, pain , or abdominal swelling? No. Pain Score  0 *  Have you tolerated food without any problems? Yes.    Have you been able to return to your normal activities? Yes.    Do you have any questions about your discharge instructions: Diet   No. Medications  No. Follow up visit  No.  Do you have questions or concerns about your Care? No.  Actions: * If pain score is 4 or above: No action needed, pain <4. 1. Have you developed a fever since your procedure? no  2.   Have you had an respiratory symptoms (SOB or cough) since your procedure? no  3.   Have you tested positive for COVID 19 since your procedure no  4.   Have you had any family members/close contacts diagnosed with the COVID 19 since your procedure?  no   If yes to any of these questions please route to Laverna Peace, RN and Karlton Lemon, RN

## 2020-09-19 ENCOUNTER — Other Ambulatory Visit: Payer: Self-pay | Admitting: Family Medicine

## 2020-09-29 ENCOUNTER — Other Ambulatory Visit: Payer: Self-pay | Admitting: Family Medicine

## 2020-10-10 DIAGNOSIS — M222X1 Patellofemoral disorders, right knee: Secondary | ICD-10-CM | POA: Diagnosis not present

## 2020-10-10 DIAGNOSIS — M79604 Pain in right leg: Secondary | ICD-10-CM | POA: Diagnosis not present

## 2020-10-14 ENCOUNTER — Encounter: Payer: Self-pay | Admitting: Family Medicine

## 2020-10-15 DIAGNOSIS — H1789 Other corneal scars and opacities: Secondary | ICD-10-CM | POA: Diagnosis not present

## 2020-10-15 DIAGNOSIS — H353131 Nonexudative age-related macular degeneration, bilateral, early dry stage: Secondary | ICD-10-CM | POA: Diagnosis not present

## 2020-10-15 DIAGNOSIS — H35033 Hypertensive retinopathy, bilateral: Secondary | ICD-10-CM | POA: Diagnosis not present

## 2020-10-15 DIAGNOSIS — H35363 Drusen (degenerative) of macula, bilateral: Secondary | ICD-10-CM | POA: Diagnosis not present

## 2020-10-15 DIAGNOSIS — H5711 Ocular pain, right eye: Secondary | ICD-10-CM | POA: Diagnosis not present

## 2020-10-15 DIAGNOSIS — S0501XS Injury of conjunctiva and corneal abrasion without foreign body, right eye, sequela: Secondary | ICD-10-CM | POA: Diagnosis not present

## 2020-10-15 DIAGNOSIS — H5319 Other subjective visual disturbances: Secondary | ICD-10-CM | POA: Diagnosis not present

## 2020-10-16 ENCOUNTER — Other Ambulatory Visit: Payer: Self-pay

## 2020-10-16 DIAGNOSIS — M79662 Pain in left lower leg: Secondary | ICD-10-CM

## 2020-10-16 DIAGNOSIS — M2550 Pain in unspecified joint: Secondary | ICD-10-CM

## 2020-10-16 DIAGNOSIS — M79661 Pain in right lower leg: Secondary | ICD-10-CM

## 2020-10-16 NOTE — Telephone Encounter (Signed)
Spoke with patient. He took a road trip to [place] and [employer] last week. Developed calf pain and went to UC in [place]. Was told that he had marginally high labs indicating poor blood flow. No calf pain upon arrival back home or past 2 days. Per a verbal from Dr. Katrinka Blazing, doppler ordered for patient. Patient called and given phone number for [facility] to schedule.

## 2020-10-16 NOTE — Progress Notes (Unsigned)
D-dimer 

## 2020-10-17 ENCOUNTER — Other Ambulatory Visit: Payer: Self-pay

## 2020-10-17 ENCOUNTER — Other Ambulatory Visit: Payer: Medicare PPO

## 2020-10-17 DIAGNOSIS — M2550 Pain in unspecified joint: Secondary | ICD-10-CM

## 2020-10-18 LAB — D-DIMER, QUANTITATIVE: D-Dimer, Quant: 0.73 mcg/mL FEU — ABNORMAL HIGH (ref <0.50)

## 2020-10-25 ENCOUNTER — Telehealth: Payer: Self-pay | Admitting: Family Medicine

## 2020-10-25 ENCOUNTER — Ambulatory Visit (HOSPITAL_COMMUNITY)
Admission: RE | Admit: 2020-10-25 | Payer: Medicare PPO | Source: Ambulatory Visit | Attending: Family Medicine | Admitting: Family Medicine

## 2020-10-25 ENCOUNTER — Ambulatory Visit (HOSPITAL_COMMUNITY)
Admission: RE | Admit: 2020-10-25 | Discharge: 2020-10-25 | Disposition: A | Discharge status: Home / Self Care | Payer: Medicare PPO | Source: Ambulatory Visit | Attending: Family Medicine | Admitting: Family Medicine

## 2020-10-25 ENCOUNTER — Other Ambulatory Visit: Payer: Self-pay

## 2020-10-25 DIAGNOSIS — M79662 Pain in left lower leg: Secondary | ICD-10-CM | POA: Diagnosis not present

## 2020-10-25 DIAGNOSIS — M79661 Pain in right lower leg: Secondary | ICD-10-CM | POA: Insufficient documentation

## 2020-10-25 NOTE — Telephone Encounter (Signed)
[facility] called to let Dr Katrinka Blazing know that the patient was negative for DTV.

## 2020-11-01 ENCOUNTER — Other Ambulatory Visit: Payer: Self-pay | Admitting: Internal Medicine

## 2020-11-07 ENCOUNTER — Encounter: Payer: Self-pay | Admitting: Family Medicine

## 2020-11-13 NOTE — Telephone Encounter (Signed)
Called patient. No answer, left HIPAA compliant VM for patient to return call to office to schedule appointment.   Kaiyan Luczak C Michele Judy, RN  

## 2020-11-15 NOTE — Telephone Encounter (Signed)
Received call from patient asking to speak with [facility].  He is trying to get an appt and has been speaking with them about it.  Bayla Mcgovern,CMA

## 2020-11-19 ENCOUNTER — Ambulatory Visit: Payer: Medicare PPO | Admitting: Family Medicine

## 2020-11-19 ENCOUNTER — Other Ambulatory Visit: Payer: Self-pay

## 2020-11-19 VITALS — BP 140/60 | HR 50 | Ht 69.0 in | Wt 189.2 lb

## 2020-11-19 DIAGNOSIS — J309 Allergic rhinitis, unspecified: Secondary | ICD-10-CM

## 2020-11-19 DIAGNOSIS — R519 Headache, unspecified: Secondary | ICD-10-CM

## 2020-11-19 DIAGNOSIS — T7840XA Allergy, unspecified, initial encounter: Secondary | ICD-10-CM

## 2020-11-19 MED ORDER — LORATADINE 10 MG PO TABS: 10.0000 mg | ORAL_TABLET | Freq: Every day | ORAL | 11 refills | Status: AC

## 2020-11-19 MED ORDER — FLUTICASONE PROPIONATE 50 MCG/ACT NA SUSP: 2.0000 | Freq: Every day | NASAL | 6 refills | Status: AC

## 2020-11-19 NOTE — Progress Notes (Signed)
e  SUBJECTIVE:   CHIEF COMPLAINT / HPI:   Chief Complaint  Patient presents with   Headache     Paul [name] is a 86 y.o. male here for headaches and started about a month ago. Started havign headaches at the top of his headaches. They would last a few seconds and go away.  Last headache while waiting to be seen today.  No headache now.  Nothing makes headaches worse or better. No known previous headaches. No vision changes, no dizziness, light headed, weakness, phonophobia, photophobia, nausea, vomiting, tearing, rhinorrhea, fevers, rash. Pain is sharp and radiates from forehead to posterior scalp. Golfs but doesn't go into woody areas or areas with tall grass. No tick bites. Headaches do not wake him up from sleep.   Takes Cilias and Sidenafil intermittently and recently stopped HDEA.     PERTINENT  PMH / PSH: reviewed and updated as appropriate   OBJECTIVE:   BP 140/60   Pulse (!) 50   Ht 5\' 9"  (1.753 m)   Wt 189 lb 4 oz (85.8 kg)   SpO2 97%   BMI 27.95 kg/m    GEN: pleasant elderly male in no acute distress  HENT: no temporal tenderness, right TM effusion, no erythema, normal left TM, no sinus tenderness   CV: regular rate and rhythm RESP: no increased work of breathing, clear to ascultation bilaterally  SKIN: warm, dry, no rash on visible skin NEURO: alert, no facial droop, normal speech, strength 5/5 bilateral upper and lower extremities, alert and oriented x4, CN II-XII grossly intact     ASSESSMENT/PLAN:   Headache Pt is a 86 yo male with new headache intermittent headaches for the past month. DDx not limited to: atypical migraine, intracranial mass, meninginitis, tension headache, cluster headache, giant cell arteritis.  Obtain CT Head as patient is elderly with new headache. ED precautions given. Pt agrees with plan.     Eustachian tube dysfunction  Start Flonase daily, Claritin 10 mg daily    91, DO PGY-3, [facility] 11/19/2020

## 2020-11-19 NOTE — Patient Instructions (Signed)
For your new headaches, I ordered a CT scan to look for causes of your headache. Be sure to go to the ED with any new symptoms (fevers, weakness, chest pain, confusion).    Stop by the pharmacy to pick up your prescriptions.   Take Care,   Dr Rachael Darby

## 2020-11-21 DIAGNOSIS — R519 Headache, unspecified: Secondary | ICD-10-CM | POA: Insufficient documentation

## 2020-11-21 NOTE — Assessment & Plan Note (Addendum)
Pt is a 86 yo male with new headache intermittent headaches for the past month. DDx not limited to: atypical migraine, drug induced,  intracranial mass, meninginitis, tension headache, cluster headache, giant cell arteritis Obtain CT Head as patient is elderly with new headache. ED precautions given. Pt agrees with plan.

## 2020-11-21 NOTE — Progress Notes (Signed)
Paul [name] Phone: [phone] Subjective:    I'm seeing this patient by the request  of:  Paul La, MD  IPeterson Lombard, LAT, ATC acting as a scribe for Paul Boxer, DO.   CC: Low back pain follow-up  EHO:[id number]  Paul [name] is a 86 y.o. male coming in with complaint of back and neck pain. OMT 09/04/2020. Patient states the back is feeling OK. Pt reports he is feeling ready for a realignment.  Patient states no significant radicular symptoms.  As long as he does do his exercises and tai chi he seems to do relatively well.  Reviewed patient's chart and patient was having some headaches.  Patient states that he may be a possibility of the Cialis.  Patient states that it is intermittent.  Denies any numbness in the extremities, denies any dizziness.  Denies any visual changes.  States that it only feels pain on the crown of his head can last several minutes has not had 1 possibly in some days.  Patient is scheduled to have a CT scan.  Medications patient has been prescribed: None          Reviewed prior external information including notes and imaging from previsou exam, outside providers and external EMR if available.   As well as notes that were available from care everywhere and other healthcare systems.  Past medical history, social, surgical and family history all reviewed in electronic medical record.  No pertanent information unless stated regarding to the chief complaint.   Past Medical History:  Diagnosis Date   Cataract    GERD (gastroesophageal reflux disease)    Hiatal hernia    Hypertension     Allergies  Allergen Reactions   Ace Inhibitors Swelling    After taking for long time had (facial) angioedema on lisinopril     Review of Systems:  No headache, visual changes, nausea, vomiting, diarrhea, constipation, dizziness, abdominal pain, skin rash, fevers, chills, night sweats, weight  loss, swollen lymph nodes, body aches, joint swelling, chest pain, shortness of breath, mood changes. POSITIVE muscle aches  Objective  Blood pressure (!) 156/94, pulse 98, height _0  (1.753 m), weight 187 lb 9.6 oz (85.1 kg), SpO2 (!) 68 %.   General: No apparent distress alert and oriented x3 mood and affect normal, dressed appropriately.  HEENT: Pupils equal, extraocular movements intact  Respiratory: Patient's speak in full sentences and does not appear short of breath  Cardiovascular: No lower extremity edema, non tender, no erythema    Osteopathic findings  C2 flexed rotated and side bent right C6 flexed rotated and side bent left T3 extended rotated and side bent right inhaled rib T9 extended rotated and side bent left L2 flexed rotated and side bent right L5 flexed rotated and side bent left Sacrum right on right       Assessment and Plan:  Headache Patient sounded provider and has been scheduled a CT scan.  We discussed which symptoms such as nausea and vomiting, visual changes to seek medical attention immediately.  Patient will call to see if he can get the CT scan sooner if needed.  Patient verbalizes understanding no significant signs noted today.   Nonallopathic problems  Decision today to treat with OMT was based on Physical Exam  After verbal consent patient was treated with HVLA, ME, FPR techniques in cervical, rib, thoracic, lumbar, and sacral  areas  Patient tolerated the procedure well  with improvement in symptoms  Patient given exercises, stretches and lifestyle modifications  See medications in patient instructions if given  Patient will follow up in 4-8 weeks      The above documentation has been reviewed and is accurate and complete Paul Pulley, DO        Note: This dictation was prepared with Dragon dictation along with smaller phrase technology. Any transcriptional errors that result from this process are unintentional.

## 2020-11-22 ENCOUNTER — Other Ambulatory Visit: Payer: Self-pay

## 2020-11-22 ENCOUNTER — Encounter: Payer: Self-pay | Admitting: Family Medicine

## 2020-11-22 ENCOUNTER — Ambulatory Visit: Payer: Medicare PPO | Admitting: Family Medicine

## 2020-11-22 VITALS — BP 156/94 | HR 98 | Ht 69.0 in | Wt 187.6 lb

## 2020-11-22 DIAGNOSIS — M48061 Spinal stenosis, lumbar region without neurogenic claudication: Secondary | ICD-10-CM | POA: Diagnosis not present

## 2020-11-22 DIAGNOSIS — M9901 Segmental and somatic dysfunction of cervical region: Secondary | ICD-10-CM

## 2020-11-22 DIAGNOSIS — M9904 Segmental and somatic dysfunction of sacral region: Secondary | ICD-10-CM

## 2020-11-22 DIAGNOSIS — R519 Headache, unspecified: Secondary | ICD-10-CM

## 2020-11-22 DIAGNOSIS — M9902 Segmental and somatic dysfunction of thoracic region: Secondary | ICD-10-CM

## 2020-11-22 DIAGNOSIS — M5416 Radiculopathy, lumbar region: Secondary | ICD-10-CM | POA: Diagnosis not present

## 2020-11-22 DIAGNOSIS — M9908 Segmental and somatic dysfunction of rib cage: Secondary | ICD-10-CM

## 2020-11-22 DIAGNOSIS — M9903 Segmental and somatic dysfunction of lumbar region: Secondary | ICD-10-CM

## 2020-11-22 NOTE — Patient Instructions (Signed)
Good to see you  If any headaches worsen with dizziness, vomiting, go to the nearest ER  Back is doing good w/ the tai chi  See me again in 6 week

## 2020-11-22 NOTE — Assessment & Plan Note (Signed)
Chronic problem but overall seems to be stable.  Discussed icing regimen, home exercises, we discussed range of motion exercises and working on hip abductor strengthening as well as hip flexor stretching.  Patient responding well to manipulation.  Follow-up again in 4 to 6 weeks

## 2020-11-22 NOTE — Assessment & Plan Note (Signed)
Patient sounded provider and has been scheduled a CT scan.  We discussed which symptoms such as nausea and vomiting, visual changes to seek medical attention immediately.  Patient will call to see if he can get the CT scan sooner if needed.  Patient verbalizes understanding no significant signs noted today.

## 2020-11-23 NOTE — Telephone Encounter (Signed)
Pt was seen in office for this on 11/19/2020. Peace Noyes Zimmerman Rumple, CMA

## 2020-11-24 ENCOUNTER — Emergency Department (HOSPITAL_COMMUNITY): Payer: Medicare PPO

## 2020-11-24 ENCOUNTER — Encounter (HOSPITAL_COMMUNITY): Payer: Self-pay | Admitting: Emergency Medicine

## 2020-11-24 ENCOUNTER — Other Ambulatory Visit: Payer: Self-pay

## 2020-11-24 ENCOUNTER — Emergency Department (HOSPITAL_COMMUNITY)
Admission: EM | Admit: 2020-11-24 | Discharge: 2020-11-24 | Disposition: A | Discharge status: Home / Self Care | Payer: Medicare PPO | Attending: Emergency Medicine | Admitting: Emergency Medicine

## 2020-11-24 DIAGNOSIS — Z87891 Personal history of nicotine dependence: Secondary | ICD-10-CM | POA: Diagnosis not present

## 2020-11-24 DIAGNOSIS — Z7982 Long term (current) use of aspirin: Secondary | ICD-10-CM | POA: Diagnosis not present

## 2020-11-24 DIAGNOSIS — N183 Chronic kidney disease, stage 3 unspecified: Secondary | ICD-10-CM | POA: Diagnosis not present

## 2020-11-24 DIAGNOSIS — R7989 Other specified abnormal findings of blood chemistry: Secondary | ICD-10-CM | POA: Insufficient documentation

## 2020-11-24 DIAGNOSIS — R202 Paresthesia of skin: Secondary | ICD-10-CM | POA: Diagnosis not present

## 2020-11-24 DIAGNOSIS — J45909 Unspecified asthma, uncomplicated: Secondary | ICD-10-CM | POA: Insufficient documentation

## 2020-11-24 DIAGNOSIS — I129 Hypertensive chronic kidney disease with stage 1 through stage 4 chronic kidney disease, or unspecified chronic kidney disease: Secondary | ICD-10-CM | POA: Insufficient documentation

## 2020-11-24 DIAGNOSIS — R519 Headache, unspecified: Secondary | ICD-10-CM

## 2020-11-24 DIAGNOSIS — E039 Hypothyroidism, unspecified: Secondary | ICD-10-CM | POA: Diagnosis not present

## 2020-11-24 DIAGNOSIS — Z96652 Presence of left artificial knee joint: Secondary | ICD-10-CM | POA: Insufficient documentation

## 2020-11-24 DIAGNOSIS — Z79899 Other long term (current) drug therapy: Secondary | ICD-10-CM | POA: Diagnosis not present

## 2020-11-24 LAB — CBC WITH DIFFERENTIAL/PLATELET
Abs Immature Granulocytes: 0.02 10*3/uL (ref 0.00–0.07)
Basophils Absolute: 0 10*3/uL (ref 0.0–0.1)
Basophils Relative: 0 %
Eosinophils Absolute: 0.2 10*3/uL (ref 0.0–0.5)
Eosinophils Relative: 3 %
HCT: 40.6 % (ref 39.0–52.0)
Hemoglobin: 13.8 g/dL (ref 13.0–17.0)
Immature Granulocytes: 0 %
Lymphocytes Relative: 29 %
Lymphs Abs: 2.3 10*3/uL (ref 0.7–4.0)
MCH: 29.1 pg (ref 26.0–34.0)
MCHC: 34 g/dL (ref 30.0–36.0)
MCV: 85.5 fL (ref 80.0–100.0)
Monocytes Absolute: 0.8 10*3/uL (ref 0.1–1.0)
Monocytes Relative: 10 %
Neutro Abs: 4.5 10*3/uL (ref 1.7–7.7)
Neutrophils Relative %: 58 %
Platelets: 165 10*3/uL (ref 150–400)
RBC: 4.75 MIL/uL (ref 4.22–5.81)
RDW: 15.7 % — ABNORMAL HIGH (ref 11.5–15.5)
WBC: 7.8 10*3/uL (ref 4.0–10.5)
nRBC: 0 % (ref 0.0–0.2)

## 2020-11-24 LAB — BASIC METABOLIC PANEL
Anion gap: 12 (ref 5–15)
BUN: 36 mg/dL — ABNORMAL HIGH (ref 8–23)
CO2: 27 mmol/L (ref 22–32)
Calcium: 9.6 mg/dL (ref 8.9–10.3)
Chloride: 103 mmol/L (ref 98–111)
Creatinine, Ser: 2.08 mg/dL — ABNORMAL HIGH (ref 0.61–1.24)
GFR, Estimated: 31 mL/min — ABNORMAL LOW (ref >60)
Glucose, Bld: 99 mg/dL (ref 70–99)
Potassium: 3.8 mmol/L (ref 3.5–5.1)
Sodium: 142 mmol/L (ref 135–145)

## 2020-11-24 MED ORDER — SODIUM CHLORIDE 0.9 % IV BOLUS
500.0000 mL | Freq: Once | INTRAVENOUS | Status: AC
  Administered 2020-11-24: 500 mL via INTRAVENOUS

## 2020-11-24 MED ORDER — IOHEXOL 350 MG/ML SOLN
75.0000 mL | Freq: Once | INTRAVENOUS | Status: AC | PRN
  Administered 2020-11-24: 75 mL via INTRAVENOUS

## 2020-11-24 NOTE — Discharge Instructions (Addendum)
Your creatinine was 2.0.  This is a measure of your kidney function.  Please drink lots of water and have your doctor recheck this level this week.  I placed a referral to the neurology clinic for your headaches.  They should reach out to you within 2 business days.  If you do not hear from them call the number above for the [facility] neurology  Please follow-up with your primary care doctor.  Let them know that we did the CT scan in the emergency department today.  If you have worsening of your headache, new numbness or weakness in your arms or legs, difficulty speaking, or any other signs or symptoms of a stroke, please come back to the ER immediately.

## 2020-11-24 NOTE — ED Triage Notes (Signed)
Pt reports mild headache x 1 month.  Has CT scheduled for 8/16.  Reports new R foot weakness that started at 1:30pm today that he noticed while walking.  States it may be related to his spinal stenosis but was concerned due to headaches.  No arm drift.  Speech clear.

## 2020-11-24 NOTE — ED Provider Notes (Addendum)
Emergency Medicine Provider Triage Evaluation Note  Paul [name] , a 86 y.o. male  was evaluated in triage.  Pt complains of headache. Persistent daily ha x 3 weeks. Seen by PCP, concern for intracranial abnormality. No weakness, facial droop. Has had tingling to right foot, unsure timing of onset, has had previously . Hx of spinal stenosis, feels similar. No bowel or bladder incontinence, saddle paresthesia. No sudden onset thunderclap HA. No fever, neck pain. HA daily however intermittent.  Review of Systems  Positive: Headache, tingling to right foot Negative: Vision changes, weakness  Physical Exam  BP (!) 152/86 (BP Location: Left Arm)   Pulse 73   Temp 98.5 F (36.9 C) (Oral)   Resp 18   SpO2 97%  Gen:   Awake, no distress   Resp:  Normal effort  MSK:   Moves extremities without difficulty  Neuro:  CN 2-12 grossly intact. Equal strength bilaterally, intact sensation bilaterally  Other:    Medical Decision Making  Medically screening exam initiated at 3:29 PM.  Appropriate orders placed.  [name] was informed that the remainder of the evaluation will be completed by another provider, this initial triage assessment does not replace that evaluation, and the importance of remaining in the ED until their evaluation is complete.  Daily headache x 3 weeks  Not a code stroke, Not LVO criteria    Elyzabeth Goatley A, PA-C 11/24/20 1533    Terald Sleeper, MD 11/25/20 [phone]

## 2020-11-24 NOTE — ED Provider Notes (Signed)
[facility] EMERGENCY DEPARTMENT Provider Note   CSN: [id number] Arrival date & time: 11/24/20  1457     History Chief Complaint  Patient presents with   Headache   Weakness    Paul [name] is a 86 y.o. male present emerged part with a headache for several weeks.  The patient reports abrupt onset of headache few weeks ago which was focally located near the crown of his scalp.  It has been throbbing.  It is sometimes worse with laying down.  It tends to come and go, but he feels nearly every day.  Nothing is made it better or worse.  He says it was mild to moderate intensity.  His PCP is trying to set up for CT scan as an outpatient but the patient came to the ED because it seemed to worsen today.  He says he was golfing and felt that he had some tingling in his right foot while golfing.  He denies any history of MI, A. fib, cardiac arrhythmia.  He denies any history of known TIA or brain aneurysm or blood clots.  He reports that 3 weeks ago when he was in [place] he was having some tingling in his right leg and had a DVT study which was negative.  HPI     Past Medical History:  Diagnosis Date   Cataract    GERD (gastroesophageal reflux disease)    Hiatal hernia    Hypertension     Patient Active Problem List   Diagnosis Date Noted   Headache 11/21/2020   BPH associated with nocturia 01/12/2020   Strain of lumbar paraspinal muscle, initial encounter 05/23/2019   Greater trochanteric bursitis of left hip 12/21/2018   AC (acromioclavicular) arthritis 11/16/2018   Rotator cuff tendinitis, left 10/27/2018   Anterior subluxation of left shoulder 06/22/2018   Hypotension 04/13/2018   Hair loss 07/17/2017   Murmur, cardiac 09/24/2016   Well adult exam 05/21/2016   Urge incontinence of urine 05/21/2016   Spinal stenosis of lumbar region with radiculopathy 08/24/2015   Arthritis of carpometacarpal joint 03/23/2013   Nonallopathic lesion of thoracic region  12/16/2012   Hypothyroidism 04/26/2012   CMC arthritis 10/21/2011   Wrist pain, chronic, left 10/20/2011   Nonallopathic lesion of lumbar region 10/24/2010   Nonallopathic lesion of sacral region 10/24/2010   Pain in joint, shoulder region 07/16/2010   CHRONIC KIDNEY DISEASE STAGE III (MODERATE) 03/15/2010   BACK PAIN, LUMBAR, WITH RADICULOPATHY 08/30/2009   GERD 03/09/2009   H/O acute gouty arthritis 05/14/2007   DEGENERATIVE DISC DISEASE, LUMBAR SPINE 07/24/2006   Hyperlipidemia 06/25/2006   ERECTILE DYSFUNCTION 06/25/2006   HYPERTENSION, BENIGN SYSTEMIC 06/25/2006   Asthma 06/25/2006    Past Surgical History:  Procedure Laterality Date   CATARACT EXTRACTION, BILATERAL     TOTAL KNEE ARTHROPLASTY     left   TRANSURETHRAL RESECTION OF PROSTATE  2011       Family History  Problem Relation Age of Onset   Diabetes Mother    Kidney disease Father    Cancer Sister        ? pancreatic   Heart disease Brother    Cancer Sister        ? pancreatic   Colon cancer Neg Hx    Stomach cancer Neg Hx    Rectal cancer Neg Hx    Esophageal cancer Neg Hx    Liver cancer Neg Hx     Social History   Tobacco Use  Smoking status: Former    Types: Cigarettes    Quit date: 04/28/1972    Years since quitting: 48.6   Smokeless tobacco: Never  Vaping Use   Vaping Use: Never used  Substance Use Topics   Alcohol use: Yes    Alcohol/week: 7.0 standard drinks    Types: 7 Standard drinks or equivalent per week    Comment: 1 glass of wine a night   Drug use: No    Home Medications Prior to Admission medications   Medication Sig Start Date End Date Taking? Authorizing Provider  allopurinol (ZYLOPRIM) 100 MG tablet TAKE 2 TABLETS(200 MG) BY MOUTH DAILY 09/19/20   [name], MD  aspirin 81 MG chewable tablet Chew 81 mg by mouth daily.    [provider]  Cholecalciferol (VITAMIN D3 PO) Take 4,000 Units by mouth daily.     [provider]  cloNIDine (CATAPRES - DOSED  IN MG/24 HR) 0.3 mg/24hr patch APPLY 1 PATCH EVERY WEEK 12/08/19   [name], MD  Cyanocobalamin (VITAMIN B-12 PO) Take 1 capsule by mouth daily.    [provider]  diclofenac sodium (VOLTAREN) 1 % GEL Apply 2 g topically 4 (four) times daily. 10/29/18   [name], MD  felodipine (PLENDIL) 10 MG 24 hr tablet TAKE 1 TABLET(10 MG) BY MOUTH DAILY 08/04/19   [name], MD  fluticasone [facility]) 50 MCG/ACT nasal spray Place 2 sprays into both nostrils daily. 11/19/20   Katha Cabal, DO  hydrochlorothiazide (HYDRODIURIL) 25 MG tablet TAKE 1/2 TABLET BY MOUTH DAILY 08/21/20   [name], MD  loratadine (CLARITIN) 10 MG tablet Take 1 tablet (10 mg total) by mouth daily. 11/19/20   Katha Cabal, DO  mirabegron ER (MYRBETRIQ) 25 MG TB24 tablet Take 1 tablet (25 mg total) by mouth daily. 07/25/20   [name], MD  Multiple Vitamins-Minerals (ICAPS AREDS 2 PO) Take 1 capsule by mouth 2 (two) times a day.    [provider]  pantoprazole (PROTONIX) 40 MG tablet TAKE 1 TABLET(40 MG) BY MOUTH TWICE DAILY BEFORE A MEAL 11/01/20   Pyrtle, Carie Caddy, MD  polyethylene glycol powder (GLYCOLAX/MIRALAX) 17 GM/SCOOP powder TAKE 17 GM(DISSSOLVED IN WATER) BY MOUTH ONCE DAILY 06/26/20   [name], MD  pravastatin (PRAVACHOL) 40 MG tablet TAKE 1 TABLET(40 MG) BY MOUTH DAILY 10/02/20   [name], MD  Pyridoxine HCl (VITAMIN B6 PO) Take 1 capsule by mouth daily.    [provider]  sildenafil (REVATIO) 20 MG tablet TAKE ONE TABLET BY MOUTH AS DIRECTED FOR ERECTILE DYSFUNCTION 02/15/20   [name], MD  Zoster Vaccine Adjuvanted [facility]) injection Inject 0.5 ml for first dose and then repeat once per immunization schedule as directed for second final dose 07/25/20   [name], MD    Allergies    Ace inhibitors  Review of Systems   Review of Systems  Constitutional:  Negative for chills and fever.  HENT:  Negative for ear pain and sore throat.   Eyes:  Negative for pain and visual  disturbance.  Respiratory:  Negative for cough and shortness of breath.   Cardiovascular:  Negative for chest pain and palpitations.  Gastrointestinal:  Negative for abdominal pain and vomiting.  Genitourinary:  Negative for dysuria and hematuria.  Musculoskeletal:  Negative for arthralgias and back pain.  Skin:  Negative for color change and rash.  Neurological:  Positive for headaches. Negative for dizziness, seizures, syncope, speech  difficulty, weakness and light-headedness.  All other systems reviewed and are negative.  Physical Exam Updated Vital Signs BP (!) 155/102   Pulse 75   Temp 97.6 F (36.4 C) (Oral)   Resp 16   Ht 5\' 9"  (1.753 m)   Wt 85.3 kg   SpO2 99%   BMI 27.76 kg/m   Physical Exam Constitutional:      General: He is not in acute distress. HENT:     Head: Normocephalic and atraumatic.  Eyes:     Conjunctiva/sclera: Conjunctivae normal.     Pupils: Pupils are equal, round, and reactive to light.  Cardiovascular:     Rate and Rhythm: Normal rate and regular rhythm.  Pulmonary:     Effort: Pulmonary effort is normal. No respiratory distress.  Abdominal:     General: There is no distension.     Tenderness: There is no abdominal tenderness.  Skin:    General: Skin is warm and dry.  Neurological:     General: No focal deficit present.     Mental Status: He is alert. Mental status is at baseline.     GCS: GCS eye subscore is 4. GCS verbal subscore is 5. GCS motor subscore is 6.     Cranial Nerves: Cranial nerves are intact.     Sensory: Sensation is intact. No sensory deficit.     Motor: Motor function is intact. No weakness.     Coordination: Coordination is intact.     Gait: Gait is intact.  Psychiatric:        Mood and Affect: Mood normal.        Behavior: Behavior normal.    ED Results / Procedures / Treatments   Labs (all labs ordered are listed, but only abnormal results are displayed) Labs Reviewed  CBC WITH DIFFERENTIAL/PLATELET -  Abnormal; Notable for the following components:      Result Value   RDW 15.7 (*)    All other components within normal limits  BASIC METABOLIC PANEL - Abnormal; Notable for the following components:   BUN 36 (*)    Creatinine, Ser 2.08 (*)    GFR, Estimated 31 (*)    All other components within normal limits    EKG None  Radiology CT Head Wo Contrast  Result Date: 11/24/2020 CLINICAL DATA:  Mild headache for 1 month. New right-sided foot weakness that started earlier today. EXAM: CT HEAD WITHOUT CONTRAST TECHNIQUE: Contiguous axial images were obtained from the base of the skull through the vertex without intravenous contrast. COMPARISON:  None. FINDINGS: Brain: Mild low density in the periventricular white matter likely related to small vessel disease. Expected cerebral volume loss for age. No mass lesion, hemorrhage, hydrocephalus, acute infarct, intra-axial, or extra-axial fluid collection. Vascular: No hyperdense vessel or unexpected calcification. Skull: No skull fracture. Sinuses/Orbits: Bilateral cataract extractions. Clear paranasal sinuses and mastoid air cells. Other: None. IMPRESSION: 1. No acute intracranial abnormality. 2. Cerebral atrophy and small vessel ischemic change. Electronically Signed   By: Jeronimo GreavesKyle  Talbot M.D.   On: 11/24/2020 16:37   CT VENOGRAM HEAD  Result Date: 11/24/2020 CLINICAL DATA:  Headache, chronic, new features or increased frequency. Persistent focal headache near crown of head, evaluate for sagittal sinus thrombosis. EXAM: CT VENOGRAM HEAD TECHNIQUE: Venographic phase images of the brain were obtained following the administration of intravenous contrast. Multiplanar reformats and maximum intensity projections were generated. CONTRAST:  75mL OMNIPAQUE IOHEXOL 350 MG/ML SOLN COMPARISON:  Non-contrast head CT performed earlier today 11/24/2020. FINDINGS: The  superior sagittal sinus, internal cerebral veins, vein of Galen, straight sinus, transverse sinuses,  sigmoid sinuses and visualized jugular veins are patent. There is no appreciable intracranial venous thrombosis. No abnormal parenchymal enhancement is identified. IMPRESSION: No evidence of dural venous sinus thrombosis. Electronically Signed   By: Jackey Loge DO   On: 11/24/2020 20:18    Procedures Procedures   Medications Ordered in ED Medications  sodium chloride 0.9 % bolus 500 mL (0 mLs Intravenous Stopped 11/24/20 2058)  iohexol (OMNIPAQUE) 350 MG/ML injection 75 mL (75 mLs Intravenous Contrast Given 11/24/20 1918)    ED Course  I have reviewed the triage vital signs and the nursing notes.  Pertinent labs & imaging results that were available during my care of the patient were reviewed by me and considered in my medical decision making (see chart for details).  This patient presents to the Emergency Department with complaint of headache.  This involves an extensive number of treatment options, and is a complaint that carries with it a high risk of complications and morbidity.  The differential diagnosis for headache includes tension type headache vs occipital headache vs sinus thrombosis vs migraine vs sinusitis vs other  I ordered, reviewed, and interpreted labs, including BMP and CBC.  There were no immediate, life-threatening emergencies found in this labwork.  Cr 2.08 (he reports baseline 1.6) which may be related to some dehydration (BUN elevation).  I gave 500 cc IV fluids here and advised more water intake at home and PCP recheck. WBC normal. CTH and CTV ordered and reviewed - no evidence of ICH, thrombosis, or mass  After the interventions stated above, I reevaluated the patient and found that the patient remained clinically stable.  No acute neurological deficits to suggest CVA or stroke.  His headache ongoing for 3 weeks is less consistent with TIA, and he has no active deficits, but we did discuss f/u with neurology and return precautions for new neurological deficits.  He  verbalized understanding.  Based on the patient's clinical exam, vital signs, risk factors, and ED testing, I felt that the patient's overall risk of life-threatening emergency such as ICH, meningitis, intracranial mass or tumor was quite low.   Final Clinical Impression(s) / ED Diagnoses Final diagnoses:  Nonintractable headache, unspecified chronicity pattern, unspecified headache type  Elevated serum creatinine    Rx / DC Orders ED Discharge Orders          Ordered    Ambulatory referral to Neurology       Comments: An appointment is requested in approximately: 2 weeks Intractable headache   11/24/20 2053             Terald Sleeper, MD 11/25/20 [phone]

## 2020-11-26 ENCOUNTER — Encounter: Payer: Self-pay | Admitting: Family Medicine

## 2020-11-26 ENCOUNTER — Encounter: Payer: Self-pay | Admitting: Neurology

## 2020-12-03 ENCOUNTER — Ambulatory Visit (INDEPENDENT_AMBULATORY_CARE_PROVIDER_SITE_OTHER): Payer: Medicare PPO | Admitting: Family Medicine

## 2020-12-03 ENCOUNTER — Encounter: Payer: Self-pay | Admitting: Family Medicine

## 2020-12-03 ENCOUNTER — Other Ambulatory Visit: Payer: Self-pay

## 2020-12-03 DIAGNOSIS — I10 Essential (primary) hypertension: Secondary | ICD-10-CM

## 2020-12-03 DIAGNOSIS — N1832 Chronic kidney disease, stage 3b: Secondary | ICD-10-CM

## 2020-12-03 DIAGNOSIS — R519 Headache, unspecified: Secondary | ICD-10-CM | POA: Diagnosis not present

## 2020-12-03 NOTE — Assessment & Plan Note (Signed)
Recheck creat today especially since he got dye in ER.  Unclear if he has element of AKI or if this is simply progression of CKD.  Consider renal consult in that he is approaching stage 4.

## 2020-12-03 NOTE — Assessment & Plan Note (Signed)
Good control.  No change. 

## 2020-12-03 NOTE — Assessment & Plan Note (Signed)
Likely tension headache but unusual since he does not have a prior hx.  Already had imaging.  Will check sed rate.  Also stay off sildenafil for now.

## 2020-12-03 NOTE — Progress Notes (Signed)
    SUBJECTIVE:   CHIEF COMPLAINT / HPI:   Elevated creatinine.  Patient went to ER because of new headache.  As part of that WU had a serum creat which had increased to 2.  Of note, he did have iv contrast in ER for CT venogram of head.  No creat done after contrast.  He has had longstanding CKD 3 (likely 3b).  Baseline creat 1.6-1.8.  No recent dehydration or med changes.  Headache: Midline, bitemporal.  No visual changes or focal weakness.  CT and cT venogram in ER, both normal.  Has neuro follow up.  Only recent med change is that he was begun on low dose sildenafil.  He has now been off this med for 1 week.  No change in headache.  No fever or constitutional symptoms.  Longstanding hypertension.  Updated med list in that he is taking 1/2 tab of HCTZ and Plendil daily     OBJECTIVE:   BP (!) 142/84   Pulse 60   Ht 5\' 9"  (1.753 m)   Wt 185 lb 9.6 oz (84.2 kg)   SpO2 98%   BMI 27.41 kg/m   VS noted.  BP seems to be good, neither over or under treated. Lungs clear  Cardiac RRR without m or g Neuro. CN, motor, sensory, gait, congnition and mood all grossly normal.  ASSESSMENT/PLAN:   CHRONIC KIDNEY DISEASE STAGE III (MODERATE) Recheck creat today especially since he got dye in ER.  Unclear if he has element of AKI or if this is simply progression of CKD.  Consider renal consult in that he is approaching stage 4.  HYPERTENSION, BENIGN SYSTEMIC Good control.  No change.  Headache Likely tension headache but unusual since he does not have a prior hx.  Already had imaging.  Will check sed rate.  Also stay off sildenafil for now.        , MD [facility]

## 2020-12-03 NOTE — Patient Instructions (Signed)
I will call with test results I doubt the headache will turn out to be anything serious.   I think your blood pressure is correctly controled. At some point, you may want to see the kidney docs for a second opinion.

## 2020-12-04 LAB — BASIC METABOLIC PANEL
BUN/Creatinine Ratio: 24 (ref 10–24)
BUN: 35 mg/dL — ABNORMAL HIGH (ref 8–27)
CO2: 23 mmol/L (ref 20–29)
Calcium: 9.8 mg/dL (ref 8.6–10.2)
Chloride: 98 mmol/L (ref 96–106)
Creatinine, Ser: 1.48 mg/dL — ABNORMAL HIGH (ref 0.76–1.27)
Glucose: 93 mg/dL (ref 65–99)
Potassium: 3.6 mmol/L (ref 3.5–5.2)
Sodium: 142 mmol/L (ref 134–144)
eGFR: 47 mL/min/{1.73_m2} — ABNORMAL LOW (ref >59)

## 2020-12-04 LAB — SEDIMENTATION RATE: Sed Rate: 22 mm/hr (ref 0–30)

## 2020-12-06 ENCOUNTER — Ambulatory Visit: Payer: Medicare PPO

## 2020-12-11 ENCOUNTER — Other Ambulatory Visit: Payer: Medicare PPO

## 2020-12-13 ENCOUNTER — Ambulatory Visit (INDEPENDENT_AMBULATORY_CARE_PROVIDER_SITE_OTHER): Payer: Medicare PPO

## 2020-12-13 DIAGNOSIS — Z0000 Encounter for general adult medical examination without abnormal findings: Secondary | ICD-10-CM | POA: Diagnosis not present

## 2020-12-13 NOTE — Patient Instructions (Signed)
  Paul [name] , Thank you for taking time to come for your Medicare Wellness Visit. I appreciate your ongoing commitment to your health goals. Please review the following plan we discussed and let me know if I can assist you in the future.   These are the goals we discussed:  Goals      Blood Pressure < 170/100     Eat 4-5 vegetables a day.     Stay Active and Independent-Low Back Pain     Timeframe:  Long-Range Goal Priority:  High Start Date:                             Expected End Date:                       Follow Up Date 12/13/2020    Patient is very active physically and spiritually. Patients goal is to stay as active as he is now.     Why is this important?   Regular activity or exercise is important to managing back pain.  Activity helps to keep your muscles strong.  You will sleep better and feel more relaxed.  You will have more energy and feel less stressed.  If you are not active now, start slowly. Little changes make a big difference.  Rest, but not too much.  Stay as active as you can and listen to your body's signals.     Notes:      Weight < 185 lb (83.915 kg)        This is a list of the screening recommended for you and due dates:  Health Maintenance  Topic Date Due   Zoster (Shingles) Vaccine (1 of 2) Never done   Flu Shot  07/26/2021*   Tetanus Vaccine  02/12/2026   COVID-19 Vaccine  Completed   Pneumonia vaccines  Completed   HPV Vaccine  Aged Out  *Topic was postponed. The date shown is not the original due date.

## 2020-12-13 NOTE — Progress Notes (Signed)
I have reviewed this visit and agree with the documentation.   

## 2020-12-13 NOTE — Progress Notes (Signed)
Subjective:   Paul [name] is a 86 y.o. male who presents for an Initial Medicare Annual Wellness Visit.  I connected with  Paul [name] on 12/13/20 by audio enabled telemedicine application and verified that I am speaking with the correct person using two identifiers.   I discussed the limitations of evaluation and management by telemedicine. The patient expressed understanding and agreed to proceed.   Review of Systems    Defer to PCP Cardiac Risk Factors include: advanced age (>65men, >61 women);hypertension;male gender     Objective:    Today's Vitals   12/13/20 0908  PainSc: 3    There is no height or weight on file to calculate BMI.  Advanced Directives 12/13/2020 12/03/2020 11/24/2020 11/19/2020 07/25/2020 01/11/2020 10/27/2018  Does Patient Have a Medical Advance Directive? Yes Yes Yes No No No No  Type of Advance Directive Living will;Healthcare Power of [employer] Power of [place];Living will Living will;Healthcare Power of Attorney - - - -  Does patient want to make changes to medical advance directive? - No - Patient declined No - Patient declined - - - -  Copy of Healthcare Power of Attorney in Chart? No - copy requested No - copy requested No - copy requested, Physician notified - - - -  Would patient like information on creating a medical advance directive? No - Patient declined No - Patient declined No - Patient declined No - Patient declined No - Patient declined No - Patient declined No - Patient declined    Current Medications (verified) Outpatient Encounter Medications as of 12/13/2020  Medication Sig   allopurinol (ZYLOPRIM) 100 MG tablet TAKE 2 TABLETS(200 MG) BY MOUTH DAILY   aspirin 81 MG chewable tablet Chew 81 mg by mouth daily.   cloNIDine (CATAPRES - DOSED IN MG/24 HR) 0.3 mg/24hr patch APPLY 1 PATCH EVERY WEEK   diclofenac sodium (VOLTAREN) 1 % GEL Apply 2 g topically 4 (four) times daily.   felodipine (PLENDIL) 10 MG 24 hr tablet TAKE 1 TABLET(10  MG) BY MOUTH DAILY   hydrochlorothiazide (HYDRODIURIL) 25 MG tablet TAKE 1/2 TABLET BY MOUTH DAILY   loratadine (CLARITIN) 10 MG tablet Take 1 tablet (10 mg total) by mouth daily.   mirabegron ER (MYRBETRIQ) 25 MG TB24 tablet Take 1 tablet (25 mg total) by mouth daily.   Multiple Vitamins-Minerals (ICAPS AREDS 2 PO) Take 1 capsule by mouth 2 (two) times a day.   pantoprazole (PROTONIX) 40 MG tablet TAKE 1 TABLET(40 MG) BY MOUTH TWICE DAILY BEFORE A MEAL   polyethylene glycol powder (GLYCOLAX/MIRALAX) 17 GM/SCOOP powder TAKE 17 GM(DISSSOLVED IN WATER) BY MOUTH ONCE DAILY   pravastatin (PRAVACHOL) 40 MG tablet TAKE 1 TABLET(40 MG) BY MOUTH DAILY   Cholecalciferol (VITAMIN D3 PO) Take 4,000 Units by mouth daily.  (Patient not taking: Reported on 12/13/2020)   Cyanocobalamin (VITAMIN B-12 PO) Take 1 capsule by mouth daily. (Patient not taking: Reported on 12/13/2020)   fluticasone (FLONASE) 50 MCG/ACT nasal spray Place 2 sprays into both nostrils daily. (Patient not taking: Reported on 12/13/2020)   Pyridoxine HCl (VITAMIN B6 PO) Take 1 capsule by mouth daily. (Patient not taking: Reported on 12/13/2020)   Tadalafil 2.5 MG TABS  (Patient not taking: Reported on 12/13/2020)   Zoster Vaccine Adjuvanted [facility]) injection Inject 0.5 ml for first dose and then repeat once per immunization schedule as directed for second final dose (Patient not taking: Reported on 12/13/2020)   [DISCONTINUED] sildenafil (REVATIO) 20 MG tablet TAKE ONE TABLET BY MOUTH  AS DIRECTED FOR ERECTILE DYSFUNCTION (Patient not taking: Reported on 12/13/2020)   No facility-administered encounter medications on file as of 12/13/2020.    Allergies (verified) Ace inhibitors   History: Past Medical History:  Diagnosis Date   GERD (gastroesophageal reflux disease)    Hiatal hernia    History of cataract    Hypertension    Past Surgical History:  Procedure Laterality Date   CATARACT EXTRACTION, BILATERAL     TOTAL KNEE ARTHROPLASTY      left   TRANSURETHRAL RESECTION OF PROSTATE  2011   Family History  Problem Relation Age of Onset   Diabetes Mother    Kidney disease Father    Cancer Sister        ? pancreatic   Heart disease Brother    Cancer Sister        ? 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   Drug use: No   Sexual activity: Yes  Other Topics Concern   Not on file  Social History Narrative   Health Care POA:    Emergency Contact:    End of Life Plan:    Who lives with you: Lives by himself in 1 story home.    Any pets: none   Diet: Patient has a varied diet of protein, starch, and vegetables.  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   Exercise: Patient golfs several times a week and does yoga at home 2x week. Patient enjoys yard work and participates in [place] [employer] at the [employer]: Patient reports wearing seat belt when in vehicle.   [name] Exposure/Protection: Patient reports wearing sun screen intermittently.    Hobbies: golfing, teaching at [employer]      Social Determinants of Health   Financial Resource Strain: Low Risk    Difficulty of Paying Living Expenses: Not hard at all   Food Insecurity: No Food Insecurity   Worried About Programme researcher, broadcasting/film/video in the Last Year: Never true   Barista in the Last Year: Never true  Transportation Needs: No Transportation Needs   Lack of Transportation (Medical): No   Lack of Transportation (Non-Medical): No  Physical Activity: Insufficiently Active   Days of Exercise per Week: 3 days   Minutes of Exercise per Session: 20 min  Stress: No Stress Concern Present   Feeling of Stress : Not at all  Social Connections: Socially Integrated   Frequency of Communication with  Friends and Family: More than three times a week   Frequency of Social Gatherings with Friends and Family: More than three times a week   Attends Religious Services: More than 4 times per year   Active Member of [employer] or Organizations: Yes   Attends Engineer, structural: More than 4 times per year   Marital Status: Living with partner    Tobacco Counseling Counseling given: No   Clinical Intake:  Pre-visit preparation completed: Yes  Pain : 0-10 Pain Score: 3  Pain Type: Acute pain Pain Location: Back Pain Orientation: Right Pain Radiating Towards: Pt describes it more as soreness than pain. Pain Descriptors / Indicators: Burning Pain Onset: In the past 7 days Pain Frequency: Intermittent Pain Relieving Factors: Topical ointment has helped. Effect of Pain on Daily Activities: Does not affect ADL's  Pain Relieving Factors: Topical ointment has helped.  Nutritional Risks: None Diabetes: No  How often do you need to have someone help you when you read instructions, pamphlets, or other written materials from your doctor or pharmacy?: 1 - Never What is the last grade level you completed in school?: PhD in Chemisty  Diabetic? NO  Interpreter Needed?: No  Information entered by :: Radford Pax, CMA   Activities of Daily Living In your present state of health, do you have any difficulty performing the following  activities: 12/13/2020  Hearing? N  Vision? N  Difficulty concentrating or making decisions? N  Walking or climbing stairs? N  Dressing or bathing? N  Doing errands, shopping? N  Preparing Food and eating ? N  Using the Toilet? N  In the past six months, have you accidently leaked urine? N  Do you have problems with loss of bowel control? N  Managing your Medications? N  Managing your Finances? N  Housekeeping or managing your Housekeeping? N  Some recent data might be hidden    Patient Care Team: [name], MD as PCP - General (Family Medicine) Nelson Chimes, MD (Ophthalmology) Dr. Chestine Spore (Dentistry) [name], MD (Sports Medicine) Barron Alvine, MD (Inactive) (Urology)  Indicate any recent Medical Services you may have received from other than [facility] providers in the past year (date may be approximate).     Assessment:   This is a routine wellness examination for Reiley.  Hearing/Vision screen No results found.  Dietary issues and exercise activities discussed: Current Exercise Habits: Home exercise routine;Structured exercise class, Type of exercise: walking;yoga;Other - see comments, Time (Minutes): 30, Frequency (Times/Week): 3, Weekly Exercise (Minutes/Week): 90, Intensity: Moderate, Exercise limited by: None identified   Goals Addressed             This Visit's Progress    Stay Active and Independent-Low Back Pain       Timeframe:  Long-Range Goal Priority:  High Start Date:                             Expected End Date:                       Follow Up Date 12/13/2020    Patient is very active physically and spiritually. Patients goal is to stay as active as he is now.     Why is this important?   Regular activity or exercise is important to managing back pain.  Activity helps to keep your muscles strong.  You will sleep better and feel more relaxed.  You will have more energy and feel less stressed.  If you are not active now, start slowly. Little  changes make a big difference.  Rest, but not too much.  Stay as active as you can and listen to your body's signals.     Notes:       Depression Screen PHQ 2/9 Scores 12/13/2020 12/03/2020 11/19/2020 07/25/2020 01/11/2020 07/20/2019 10/27/2018  PHQ - 2 Score 0 0 0 0 0 0 0  PHQ- 9 Score 0 0 0 0 0 - -  Exception Documentation - - - - - - -    Fall Risk Fall Risk  12/13/2020 12/03/2020 11/19/2020 07/25/2020 07/20/2019  Falls in the past year? 0 0 0 0 0  Number falls in past yr: 0 - 0 - 0  Comment - - - - -  Injury with Fall? 0 - 0 - -  Risk for fall due to : No Fall Risks - - - -  Follow up Falls evaluation completed - - - Falls evaluation completed    FALL RISK PREVENTION PERTAINING TO THE HOME:  Any stairs in or around the home? Yes  If so, are there any without handrails? Yes  Home free of loose throw rugs in walkways, pet beds, electrical cords, etc? Yes  Adequate lighting in your home to reduce risk of falls? Yes   ASSISTIVE DEVICES UTILIZED TO PREVENT FALLS:  Life alert? No  Use of a cane, walker or w/c? No  Grab bars in the bathroom? No  Shower chair or bench in shower? No  Elevated toilet seat or a handicapped toilet? No   TIMED UP AND GO:  Was the test performed?  N/A.  Length of time to ambulate 10 feet: N/A sec.   Per patient statement: Gait steady and fast without use of assistive device  Cognitive Function: MMSE - Mini Mental State Exam 07/31/2010  Orientation to time 5  Orientation to Place 5  Registration 3  Attention/ Calculation 5  Recall 3  Language- name 2 objects 2  Language- repeat 1  Language- follow 3 step command 3  Language- read & follow direction 1  Write a sentence 1  Copy design 1  Total score 30     6CIT Screen 12/13/2020  What Year? 0 points  What month? 0 points  What time? 0 points  Count back from 20 0 points  Months in reverse 0 points  Repeat phrase 0 points  Total Score 0    Immunizations Immunization History  Administered  Date(s) Administered   Fluad Quad(high Dose 65+) 01/21/2019   Influenza Split 02/20/2011, 04/07/2012   Influenza Whole 05/09/2008, 03/15/2010   Influenza, High Dose Seasonal PF 02/15/2015, 02/23/2018   Influenza,inj,Quad PF,6+ Mos 03/14/2013, 02/13/2016   Influenza-Unspecified 02/27/2017   PFIZER(Purple Top)SARS-COV-2 Vaccination 04/30/2019, 06/09/2019, 02/08/2020, 09/27/2020   Pneumococcal Conjugate-13 04/08/2013   Pneumococcal Polysaccharide-23 09/04/2009, 05/13/2011   Td 10/07/2007   Tdap 02/13/2016   Zoster, Live 05/15/2011    TDAP status: Up to date  Flu Vaccine status: Due, Education has been provided regarding the importance of this vaccine. Advised may receive this vaccine at local pharmacy or Health Dept. Aware to provide a copy of the vaccination record if obtained from local pharmacy or Health Dept. Verbalized acceptance and understanding.  Pneumococcal vaccine status: Up to date  Covid-19 vaccine status: Completed vaccines  Qualifies for Shingles Vaccine? Yes   Zostavax completed Yes   Shingrix Completed?: No.    Education has been provided regarding  the importance of this vaccine. Patient has been advised to call insurance company to determine out of pocket expense if they have not yet received this vaccine. Advised may also receive vaccine at local pharmacy or Health Dept. Verbalized acceptance and understanding.  Screening Tests Health Maintenance  Topic Date Due   Zoster Vaccines- Shingrix (1 of 2) Never done   INFLUENZA VACCINE  07/26/2021 (Originally 11/26/2020)   TETANUS/TDAP  02/12/2026   COVID-19 Vaccine  Completed   PNA vac Low Risk Adult  Completed   HPV VACCINES  Aged Out    Health Maintenance  Health Maintenance Due  Topic Date Due   Zoster Vaccines- Shingrix (1 of 2) Never done    Colorectal cancer screening: No longer required.   Lung Cancer Screening: (Low Dose CT Chest recommended if Age 38-80 years, 30 pack-year currently smoking OR have quit  w/in 15years.) does not qualify.   Lung Cancer Screening Referral: N/A  Additional Screening:  Hepatitis C Screening: does qualify; Completed No record  Vision Screening: Recommended annual ophthalmology exams for early detection of glaucoma and other disorders of the eye. Is the patient up to date with their annual eye exam?  Yes  Who is the provider or what is the name of the office in which the patient attends annual eye exams?  If pt is not established with a provider, would they like to be referred to a provider to establish care? N/A.   Dental Screening: Recommended annual dental exams for proper oral hygiene  Community Resource Referral / Chronic Care Management: CRR required this visit?  No   CCM required this visit?  No      Plan:     I have personally reviewed and noted the following in the patient's chart:   Medical and social history Use of alcohol, tobacco or illicit drugs  Current medications and supplements including opioid prescriptions. Patient is not currently taking opioid prescriptions. Functional ability and status Nutritional status Physical activity Advanced directives List of other physicians Hospitalizations, surgeries, and ER visits in previous 12 months Vitals Screenings to include cognitive, depression, and falls Referrals and appointments  In addition, I have reviewed and discussed with patient certain preventive protocols, quality metrics, and best practice recommendations. A written personalized care plan for preventive services as well as general preventive health recommendations were provided to patient.     ESDRAS DELAIR, CMA   12/13/2020   Nurse Notes:    Mr. Knope , Thank you for taking time to come for your Medicare Wellness Visit. I appreciate your ongoing commitment to your health goals. Please review the following plan we discussed and let me know if I can assist you in the future.   These are the goals we discussed:   Goals      Blood Pressure < 170/100     Eat 4-5 vegetables a day.     Stay Active and Independent-Low Back Pain     Timeframe:  Long-Range Goal Priority:  High Start Date:                             Expected End Date:                       Follow Up Date 12/13/2020    Patient is very active physically and spiritually. Patients goal is to stay as active as he is now.     Why  is this important?   Regular activity or exercise is important to managing back pain.  Activity helps to keep your muscles strong.  You will sleep better and feel more relaxed.  You will have more energy and feel less stressed.  If you are not active now, start slowly. Little changes make a big difference.  Rest, but not too much.  Stay as active as you can and listen to your body's signals.     Notes:      Weight < 185 lb (83.915 kg)        This is a list of the screening recommended for you and due dates:  Health Maintenance  Topic Date Due   Zoster (Shingles) Vaccine (1 of 2) Never done   Flu Shot  07/26/2021*   Tetanus Vaccine  02/12/2026   COVID-19 Vaccine  Completed   Pneumonia vaccines  Completed   HPV Vaccine  Aged Out  *Topic was postponed. The date shown is not the original due date.

## 2020-12-14 ENCOUNTER — Other Ambulatory Visit: Payer: Self-pay

## 2020-12-14 ENCOUNTER — Ambulatory Visit (INDEPENDENT_AMBULATORY_CARE_PROVIDER_SITE_OTHER): Payer: Medicare PPO

## 2020-12-14 ENCOUNTER — Encounter: Payer: Self-pay | Admitting: Family Medicine

## 2020-12-14 ENCOUNTER — Ambulatory Visit: Payer: Medicare PPO | Admitting: Family Medicine

## 2020-12-14 VITALS — BP 148/82 | HR 72 | Ht 69.0 in | Wt 188.0 lb

## 2020-12-14 DIAGNOSIS — M545 Low back pain, unspecified: Secondary | ICD-10-CM

## 2020-12-14 DIAGNOSIS — S39012A Strain of muscle, fascia and tendon of lower back, initial encounter: Secondary | ICD-10-CM

## 2020-12-14 MED ORDER — TIZANIDINE HCL 2 MG PO TABS: 2.0000 mg | ORAL_TABLET | Freq: Every day | ORAL | 0 refills | Status: DC

## 2020-12-14 MED ORDER — FELODIPINE ER 10 MG PO TB24: ORAL_TABLET | ORAL | 3 refills | Status: DC

## 2020-12-14 MED ORDER — PREDNISONE 20 MG PO TABS: 20.0000 mg | ORAL_TABLET | Freq: Every day | ORAL | 0 refills | Status: DC

## 2020-12-14 NOTE — Patient Instructions (Signed)
Trigger points today Zanaflex 2mg  at night Prednisone 20mg  for next 5 days See me again as scheduled Xray on way out

## 2020-12-14 NOTE — Progress Notes (Signed)
[name] [address] [place] [address] Phone: [phone] Subjective:   Paul [name], am serving as a scribe for Dr. Antoine Primas. This visit occurred during the SARS-CoV-2 public health emergency.  Safety protocols were in place, including screening questions prior to the visit, additional usage of staff PPE, and extensive cleaning of exam room while observing appropriate contact time as indicated for disinfecting solutions.   I'm seeing this patient by the request  of:  [name], MD  CC: Back pain follow-up  [id number]  Paul [name] is a 86 y.o. male coming in with complaint of back and neck pain. OMT 11/22/2020. R hip pain. Was seen in ED on 11/24/2020 for headaches. Patient states that his pain began on Monday. Feels like he pulled something in R SI joint area. Pain has progressively gotten worse after playing golf and trying to hit ball further distance. Jumped out of bed this morning and pain is now worse. Denies any radiating symptoms but is unable to stand up straight due to pain.   Medications patient has been prescribed: None        Reviewed prior external information including notes and imaging from previsou exam, outside providers and external EMR if available.   As well as notes that were available from care everywhere and other healthcare systems.  Past medical history, social, surgical and family history all reviewed in electronic medical record.  No pertanent information unless stated regarding to the chief complaint.   Past Medical History:  Diagnosis Date   GERD (gastroesophageal reflux disease)    Hiatal hernia    History of cataract    Hypertension     Allergies  Allergen Reactions   Ace Inhibitors Swelling    After taking for long time had (facial) angioedema on lisinopril     Review of Systems:  No headache, visual changes, nausea, vomiting, diarrhea, constipation, dizziness, abdominal pain, skin  rash, fevers, chills, night sweats, weight loss, swollen lymph nodes,  joint swelling, chest pain, shortness of breath, mood changes. POSITIVE muscle aches, body aches  Objective  Blood pressure (!) 148/82, pulse 72, height 5\' 9"  (1.753 m), weight 188 lb (85.3 kg), SpO2 98 %.   General: No apparent distress alert and oriented x3 mood and affect normal, dressed appropriately.  HEENT: Pupils equal, extraocular movements intact  Respiratory: Patient's speak in full sentences and does not appear short of breath  Cardiovascular: No lower extremity edema, non tender, no erythema  Low back exam does have significant loss of lordosis.  Difficulty though with extension of the back for the last 5 degrees.  Patient does have what appears to be trigger points in the paraspinal musculature of the lumbar spine 3 in nature.  Seems to be surrounding the sacroiliac joint.  Negative straight leg test.  Good range of motion of the hip for patient's baseline.  Neurovascularly intact distally.   After verbal consent patient was prepped with alcohol swab and with a 25-gauge 1 inch needle injected into 3 distinct trigger points in the lumbar area.  Attempted the erector spinae I musculature.  Total of 3 cc of 0.5% Marcaine and 1 cc Kenalog 40 mg/mL.  No blood loss.  Band-Aid placed.  Postinjection instructions given    Assessment and Plan:        The above documentation has been reviewed and is accurate and complete , DO        Note: This dictation was  prepared with Dragon dictation along with smaller phrase technology. Any transcriptional errors that result from this process are unintentional.

## 2020-12-14 NOTE — Assessment & Plan Note (Signed)
Likely strain.  Patient did have some trigger points noted today.  Given injections.  Patient will avoid anti-inflammatories secondary to chronic kidney disease but given a short-term burst of prednisone if necessary.  We also discussed the potential for a low-dose of temazepam to use at bedtime if any difficulties but try to use it only as needed.  Patient has good core strength for somebody with his spinal stenosis and age.  Encouraged him to continue to work on echo and do the exercises.  Hold on golf for the next 72 hours follow-up with me as scheduled.  If worsening pain seek medical attention immediately

## 2020-12-16 ENCOUNTER — Encounter: Payer: Self-pay | Admitting: Family Medicine

## 2020-12-19 ENCOUNTER — Encounter: Payer: Self-pay | Admitting: Family Medicine

## 2020-12-20 DIAGNOSIS — H35033 Hypertensive retinopathy, bilateral: Secondary | ICD-10-CM | POA: Diagnosis not present

## 2020-12-20 DIAGNOSIS — H35363 Drusen (degenerative) of macula, bilateral: Secondary | ICD-10-CM | POA: Diagnosis not present

## 2020-12-20 DIAGNOSIS — H35013 Changes in retinal vascular appearance, bilateral: Secondary | ICD-10-CM | POA: Diagnosis not present

## 2020-12-20 DIAGNOSIS — Z961 Presence of intraocular lens: Secondary | ICD-10-CM | POA: Diagnosis not present

## 2020-12-20 DIAGNOSIS — H353131 Nonexudative age-related macular degeneration, bilateral, early dry stage: Secondary | ICD-10-CM | POA: Diagnosis not present

## 2020-12-21 ENCOUNTER — Ambulatory Visit: Payer: Medicare PPO | Admitting: Family Medicine

## 2020-12-21 ENCOUNTER — Other Ambulatory Visit: Payer: Self-pay

## 2020-12-21 VITALS — Ht 69.0 in | Wt 188.0 lb

## 2020-12-21 DIAGNOSIS — M25551 Pain in right hip: Secondary | ICD-10-CM | POA: Diagnosis not present

## 2020-12-21 MED ORDER — KETOROLAC TROMETHAMINE 60 MG/2ML IM SOLN
30.0000 mg | Freq: Once | INTRAMUSCULAR | Status: AC
  Administered 2020-12-21: 30 mg via INTRAMUSCULAR

## 2020-12-21 MED ORDER — PREDNISONE 20 MG PO TABS: 40.0000 mg | ORAL_TABLET | Freq: Every day | ORAL | 0 refills | Status: AC

## 2020-12-21 NOTE — Progress Notes (Signed)
PCP: [name], MD  Subjective:   HPI: Patient is a 86 y.o. male here for evaluation of right buttock/posterior hip pain.  Patient is an avid golfer and states about 10-11 days ago he was swinging a little too hard and on his follow-through he felt a change like pain in the posterior lateral right hip.  He continued to play through the round, however the next day he tried to play golf and it was just too painful.  He saw physician, Terrilee Files, who performed what sounds like an SI joint injection and put him on a 5-day prednisone 20 mg burst.  This had been improving over the following few days, however just yesterday the patient was walking his dog when he was yanked to the side and he twisted his hips and he felt pain again in the same location of the posterior lateral right hip.  He is quite sore and stiff and having difficulty lying on that side to sleep and ambulating.  He denies any radiating pain down either lower extremity.  No loss of bowel or bladder incontinence.  He did take Tylenol yesterday, but in general tries to avoid NSAIDs given his CKD.  Denies any lower extremity weakness or giving out of the leg.  Past Medical History:  Diagnosis Date   GERD (gastroesophageal reflux disease)    Hiatal hernia    History of cataract    Hypertension     Ht 5\' 9"  (1.753 m)   Wt 188 lb (85.3 kg)   BMI 27.76 kg/m   [facility] 12/21/2020  Frequency of aerobic exercise (# of days/week) 3  Average time in minutes 0  Frequency of strengthening activities (# of days/week) 7    No flowsheet data found.  Review of Systems: See HPI above.     Objective:  Physical Exam:  Gen: Well-appearing, in no acute distress; non-toxic CV: Regular Rate. Well-perfused. Warm.  Resp: Breathing unlabored on room air; no wheezing. Psych: Fluid speech in conversation; appropriate affect; normal thought process Neuro: Sensation intact throughout. No gross coordination deficits.   MSK:   - Lumbar spine: no spinous process TTP, flex/extension intact without pain. Negative SLR, Slump's testing.  - Right hip: There is tenderness to palpation at the right SI joint as well as above and below.  There is paraspinal hypertonicity of the right paraspinal lumbar muscles.  There is no obvious rash, erythema, ecchymosis surrounding.  The patient does ambulate with a slightly flexed lumbar spine pelvic alignment appears normal.  There is no significant tenderness over the greater trochanter, warmness muscle, or ASIS.  There is mild pain with resisted hip flexion and external rotation.  Negative FADIR test.  Positive FABER test. Neurovascularly intact distally.  Imaging (reviewed today by myself and Dr. 12/23/2020): DG Lumbar Spine 2-3 Views CLINICAL DATA:  Lumbar spine pain patient reports right low back pain for 5 days after swinging golf club.  EXAM: LUMBAR SPINE - 2-3 VIEW  COMPARISON:  Lumbar radiograph 10/11/2018  FINDINGS: Five lumbar type vertebra. Reversal of normal lordosis, with progression of straightening on prior. Slight retrolisthesis of L2 and L3 and L3 on L4 is similar to prior exam. There is advanced disc space narrowing and endplate spurring at all levels. Progressive degenerative disc disease at L1-L2 from prior. Diffuse vacuum phenomenon. Facet hypertrophy from L2-L3 through the lumbosacral junction. The vertebral body heights are preserved. There is no evidence of fracture or bony destruction. The sacroiliac joints are congruent.  IMPRESSION: 1. Advanced multilevel degenerative disc disease, majority is stable from prior, there is mild progression at L1-L2 from 2020 exam. 2. Multilevel facet hypertrophy. 3. Reversal of normal lordosis can be seen in the setting of muscle spasm.  Electronically Signed   By: Narda Rutherford M.D.   On: 12/15/2020 15:19     Assessment & Plan:  1. Right hip pain - likely SI joint pathology with associated surrounding  muscle spasm and hypertonicity.  Has exacerbated twice over the last 2 weeks, with initial relief after first injury with Prednisone burst and SI-joint injection. X-rays reviewed from last week with advanced thoracic/lumbar DDD, which is chronic and I do not feel an exacerbating symptom of current condition.   - Toradol IM injection given today into right buttock - Prednisone burst, 40mg  x 5 days - heat/ice as needed - gentle ROM and stretching as pain improves - follow-up as needed  , DO PGY-4, Sports Medicine Fellow [facility]

## 2020-12-23 NOTE — Progress Notes (Signed)
[facility]: Attending Note: I have reviewed the chart, discussed wit the Sports Medicine Fellow. I agree with assessment and treatment plan as detailed in the Fellow's note.  

## 2021-01-01 NOTE — Progress Notes (Signed)
[name] [address] [place] [address] Phone: [phone] Subjective:   Paul [name], am serving as a scribe for Dr. Antoine Primas.  This visit occurred during the SARS-CoV-2 public health emergency.  Safety protocols were in place, including screening questions prior to the visit, additional usage of staff PPE, and extensive cleaning of exam room while observing appropriate contact time as indicated for disinfecting solutions.   I'm seeing this patient by the request  of:  [name], MD  CC: Low back pain follow-up  HWE:[id number]  12/14/2020 Likely strain.  Patient did have some trigger points noted today.  Given injections.  Patient will avoid anti-inflammatories secondary to chronic kidney disease but given a short-term burst of prednisone if necessary.  We also discussed the potential for a low-dose of temazepam to use at bedtime if any difficulties but try to use it only as needed.  Patient has good core strength for somebody with his spinal stenosis and age.  Encouraged him to continue to work on echo and do the exercises.  Hold on golf for the next 72 hours follow-up with me as scheduled.  If worsening pain seek medical attention immediately  Update 01/03/2021 Paul [name] is a 86 y.o. male coming in with complaint of SI joint and hip pain. Patient did see Dr. Jennette Kettle on 12/21/2020 as his pain worsened in SI joint after getting pulled by his dog while on a walk. Was given Toradoal injection and put on prednisone taper. Patient states that his pain has improved since last visit and since seeing Dr. Jennette Kettle.  Patient states since then still some mild tightness and not quite back down to his baseline.  Was able to play golf for the first time in 3 weeks.  Patient states no significant exacerbation since this point.      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   Drug use: No   Sexual activity: Yes  Other Topics Concern   Not on file  Social History Narrative   Health Care POA:    Emergency Contact:    End of Life Plan:    Who lives with you: Lives by himself in 1 story home.    Any pets: none   Diet: Patient has a varied diet of protein, starch,  and vegetables.  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   Exercise: Patient golfs several times a week and does yoga at home 2x week. Patient enjoys yard work and participates in [place] [employer] at the [employer]: Patient reports wearing seat belt when in vehicle.   [name] Exposure/Protection: Patient reports wearing sun screen intermittently.    Hobbies: golfing, teaching at [employer]      Social Determinants of Health   Financial Resource Strain: Low Risk    Difficulty of Paying Living Expenses: Not hard at all  Food Insecurity: No Food Insecurity   Worried About Programme researcher, broadcasting/film/video in the Last Year: Never true   Barista in the Last Year: Never true  Transportation Needs: No Transportation  Needs   Lack of Transportation (Medical): No   Lack of Transportation (Non-Medical): No  Physical Activity: Insufficiently Active   Days of Exercise per Week: 3 days   Minutes of Exercise per Session: 20 min  Stress: No Stress Concern Present   Feeling of Stress : Not at all  Social Connections: Socially Integrated   Frequency of Communication with Friends and Family: More than three times a week   Frequency of Social Gatherings with Friends and Family: More than three times a week   Attends Religious Services: More than 4 times per year   Active Member of [employer] or Organizations: Yes   Attends Engineer, structural: More than 4 times per year   Marital Status: Living with partner   Allergies  Allergen Reactions   Ace Inhibitors Swelling    After taking for long time had (facial) angioedema on lisinopril   Family History  Problem Relation Age of Onset   Diabetes Mother    Kidney disease Father    Cancer Sister        ? pancreatic   Heart disease Brother    Cancer Sister        ? pancreatic   Colon cancer Neg Hx    Stomach cancer Neg Hx    Rectal cancer Neg Hx    Esophageal cancer Neg Hx    Liver cancer Neg Hx     Current Outpatient Medications (Endocrine & Metabolic):    predniSONE (DELTASONE) 20 MG tablet, Take 1 tablet (20 mg total) by mouth daily with breakfast.  Current Outpatient Medications (Cardiovascular):    cloNIDine (CATAPRES - DOSED IN MG/24 HR) 0.3 mg/24hr patch, APPLY 1 PATCH EVERY WEEK   felodipine (PLENDIL) 10 MG 24 hr tablet, TAKE 1 TABLET(10 MG) BY MOUTH DAILY   hydrochlorothiazide (HYDRODIURIL) 25 MG tablet, TAKE 1/2 TABLET BY MOUTH DAILY   pravastatin (PRAVACHOL) 40 MG tablet, TAKE 1 TABLET(40 MG) BY MOUTH DAILY   Tadalafil 2.5 MG TABS,   Current Outpatient Medications (Respiratory):    fluticasone (FLONASE) 50 MCG/ACT nasal spray, Place 2 sprays into both nostrils daily.   loratadine (CLARITIN) 10 MG tablet, Take 1 tablet (10 mg total) by  mouth daily.  Current Outpatient Medications (Analgesics):    allopurinol (ZYLOPRIM) 100 MG tablet, TAKE 2 TABLETS(200 MG) BY MOUTH DAILY   aspirin 81 MG chewable tablet, Chew 81 mg by mouth daily.  Current Outpatient Medications (Hematological):    Cyanocobalamin (VITAMIN B-12 PO), Take 1 capsule by mouth daily.  Current Outpatient Medications (Other):    Cholecalciferol (VITAMIN D3 PO), Take 4,000 Units by mouth daily.   diclofenac sodium (VOLTAREN) 1 % GEL, Apply 2 g topically 4 (four) times  daily.   mirabegron ER (MYRBETRIQ) 25 MG TB24 tablet, Take 1 tablet (25 mg total) by mouth daily.   Multiple Vitamins-Minerals (ICAPS AREDS 2 PO), Take 1 capsule by mouth 2 (two) times a day.   pantoprazole (PROTONIX) 40 MG tablet, TAKE 1 TABLET(40 MG) BY MOUTH TWICE DAILY BEFORE A MEAL   polyethylene glycol powder (GLYCOLAX/MIRALAX) 17 GM/SCOOP powder, TAKE 17 GM(DISSSOLVED IN WATER) BY MOUTH ONCE DAILY   Pyridoxine HCl (VITAMIN B6 PO), Take 1 capsule by mouth daily.   tiZANidine (ZANAFLEX) 2 MG tablet, Take 1 tablet (2 mg total) by mouth at bedtime.   Zoster Vaccine Adjuvanted [facility]) injection, Inject 0.5 ml for first dose and then repeat once per immunization schedule as directed for second final dose   Reviewed prior external information including notes and imaging from  primary care provider this includes patient's most recent exacerbation of the back where patient was given a Toradol injection  Past medical history, social, surgical and family history all reviewed in electronic medical record.  No pertanent information unless stated regarding to the chief complaint.   Review of Systems:  No headache, visual changes, nausea, vomiting, diarrhea, constipation, dizziness, abdominal pain, skin rash, fevers, chills, night sweats, weight loss, swollen lymph nodes, joint swelling, chest pain, shortness of breath, mood changes. POSITIVE muscle aches, body aches  Objective  Blood pressure 124/88,  pulse 75, height 5\' 9"  (1.753 m), weight 188 lb (85.3 kg), SpO2 98 %.   General: No apparent distress alert and oriented x3 mood and affect normal, dressed appropriately.  HEENT: Pupils equal, extraocular movements intact  Respiratory: Patient's speak in full sentences and does not appear short of breath  Cardiovascular: No lower extremity edema, non tender, no erythema  Gait normal with good balance and coordination.  MSK: Arthritic changes of multiple joints.  Patient's low back does have loss of lordosis.  Some tenderness to palpation of the paraspinal musculature.  Tightness noted with straight leg as well as FABER test but no true radicular symptoms.  Lacks last 10 degrees of extension of the back  Osteopathic findings T8 extended rotated and side bent right L1 flexed rotated and side bent right L5 flexed rotated and side bent left Sacrum right on right    Impression and Recommendations:     The above documentation has been reviewed and is accurate and complete , DO

## 2021-01-03 ENCOUNTER — Ambulatory Visit: Payer: Medicare PPO | Admitting: Family Medicine

## 2021-01-03 ENCOUNTER — Other Ambulatory Visit: Payer: Self-pay

## 2021-01-03 VITALS — BP 124/88 | HR 75 | Ht 69.0 in | Wt 188.0 lb

## 2021-01-03 DIAGNOSIS — M48061 Spinal stenosis, lumbar region without neurogenic claudication: Secondary | ICD-10-CM

## 2021-01-03 DIAGNOSIS — M999 Biomechanical lesion, unspecified: Secondary | ICD-10-CM

## 2021-01-03 DIAGNOSIS — M9904 Segmental and somatic dysfunction of sacral region: Secondary | ICD-10-CM

## 2021-01-03 DIAGNOSIS — M9902 Segmental and somatic dysfunction of thoracic region: Secondary | ICD-10-CM

## 2021-01-03 DIAGNOSIS — M9903 Segmental and somatic dysfunction of lumbar region: Secondary | ICD-10-CM

## 2021-01-03 DIAGNOSIS — M5416 Radiculopathy, lumbar region: Secondary | ICD-10-CM | POA: Diagnosis not present

## 2021-01-03 NOTE — Assessment & Plan Note (Signed)
Known spinal stenosis.  Patient seems to be doing relatively well overall and did have 1 recently more of a back spasm eventually any radicular symptoms that were consistent with the spinal stenosis.  Patient did respond well to manipulation today.  We will continue to monitor.  Patient's tramadol injection by another provider did help out significantly as well.  Discussed which activities to do which wants to avoid.  Increase activity slowly.  Follow-up with me again 4 to 6 weeks

## 2021-01-03 NOTE — Patient Instructions (Addendum)
Thanks for the dog training tips You can get back on the course See you again in 4-5 weeks

## 2021-01-03 NOTE — Assessment & Plan Note (Signed)

## 2021-01-18 ENCOUNTER — Other Ambulatory Visit: Payer: Self-pay | Admitting: Family Medicine

## 2021-01-30 NOTE — Progress Notes (Signed)
[place] Phone: [phone] Subjective:   [name], am serving as a scribe for Dr. Hulan Saas.  This visit occurred during the SARS-CoV-2 public health emergency.  Safety protocols were in place, including screening questions prior to the visit, additional usage of staff PPE, and extensive cleaning of exam room while observing appropriate contact time as indicated for disinfecting solutions.   I'm seeing this patient by the request  of:  [name], MD  CC: Neck and back pain  [id number]  Theordore Cisnero is a 86 y.o. male coming in with complaint of back and neck pain. OMT 01/03/2021. Patient states that his back has not been bothering him much recently. Has been stretching and doing tai chi.   Medications patient has been prescribed: None         Reviewed prior external information including notes and imaging from previsou exam, outside providers and external EMR if available.   As well as notes that were available from care everywhere and other healthcare systems.  Past medical history, social, surgical and family history all reviewed in electronic medical record.  No pertanent information unless stated regarding to the chief complaint.   Past Medical History:  Diagnosis Date   GERD (gastroesophageal reflux disease)    Hiatal hernia    History of cataract    Hypertension     Allergies  Allergen Reactions   Ace Inhibitors Swelling    After taking for long time had (facial) angioedema on lisinopril     Review of Systems:  No headache, visual changes, nausea, vomiting, diarrhea, constipation, dizziness, abdominal pain, skin rash, fevers, chills, night sweats, weight loss, swollen lymph nodes, body aches, joint swelling, chest pain, shortness of breath, mood changes. POSITIVE muscle aches  Objective  Blood pressure 130/78, pulse 81, height $RemoveBe'5\' 9"'oUwaQqeLM$  (1.753 m), weight 190 lb (86.2 kg), SpO2 98  %.   General: No apparent distress alert and oriented x3 mood and affect normal, dressed appropriately.  HEENT: Pupils equal, extraocular movements intact  Respiratory: Patient's speak in full sentences and does not appear short of breath  Cardiovascular: No lower extremity edema, non tender, no erythema  Low back exam does have some loss of lordosis.  Patient noted does have some improvement in the range of motion of his hips recently.  Still tenderness noted in the paraspinal musculature of the lumbar overall.  Osteopathic findings  T5 extended rotated and side bent right L2 flexed rotated and side bent right Sacrum right on right       Assessment and Plan:  Spinal stenosis of lumbar region with radiculopathy Known spinal stenosis  Stable overall.  Discussed home exercises and icing regimen.  Discussed that he hip motion.  Patient continues to do tai chi and workout on a regular basis.  We will follow-up with me again in 6 weeks   Nonallopathic problems  Decision today to treat with OMT was based on Physical Exam  After verbal consent patient was treated with HVLA, ME, FPR techniques in  thoracic, lumbar, and sacral  areas  Patient tolerated the procedure well with improvement in symptoms  Patient given exercises, stretches and lifestyle modifications  See medications in patient instructions if given  Patient will follow up in 4-8 weeks     The above documentation has been reviewed and is accurate and complete Lyndal Pulley, DO        Note: This dictation was prepared  with Dragon dictation along with smaller phrase technology. Any transcriptional errors that result from this process are unintentional.

## 2021-01-31 ENCOUNTER — Ambulatory Visit: Payer: Medicare PPO | Admitting: Family Medicine

## 2021-01-31 ENCOUNTER — Other Ambulatory Visit: Payer: Self-pay

## 2021-01-31 ENCOUNTER — Encounter: Payer: Self-pay | Admitting: Family Medicine

## 2021-01-31 VITALS — BP 130/78 | HR 81 | Ht 69.0 in | Wt 190.0 lb

## 2021-01-31 DIAGNOSIS — M5416 Radiculopathy, lumbar region: Secondary | ICD-10-CM

## 2021-01-31 DIAGNOSIS — M48061 Spinal stenosis, lumbar region without neurogenic claudication: Secondary | ICD-10-CM | POA: Diagnosis not present

## 2021-01-31 DIAGNOSIS — M9904 Segmental and somatic dysfunction of sacral region: Secondary | ICD-10-CM

## 2021-01-31 DIAGNOSIS — M9902 Segmental and somatic dysfunction of thoracic region: Secondary | ICD-10-CM

## 2021-01-31 DIAGNOSIS — M9903 Segmental and somatic dysfunction of lumbar region: Secondary | ICD-10-CM | POA: Diagnosis not present

## 2021-01-31 NOTE — Assessment & Plan Note (Signed)
Known spinal stenosis  Stable overall.  Discussed home exercises and icing regimen.  Discussed that he hip motion.  Patient continues to do tai chi and workout on a regular basis.  We will follow-up with me again in 6 weeks

## 2021-01-31 NOTE — Patient Instructions (Signed)
Upright Go for posture Thanks for showing me your hip motion Good luck w yard See me in 6 weeks

## 2021-02-11 ENCOUNTER — Encounter: Payer: Self-pay | Admitting: Family Medicine

## 2021-02-11 DIAGNOSIS — E785 Hyperlipidemia, unspecified: Secondary | ICD-10-CM | POA: Diagnosis not present

## 2021-02-11 DIAGNOSIS — K5900 Constipation, unspecified: Secondary | ICD-10-CM | POA: Diagnosis not present

## 2021-02-11 DIAGNOSIS — M109 Gout, unspecified: Secondary | ICD-10-CM | POA: Diagnosis not present

## 2021-02-11 DIAGNOSIS — I10 Essential (primary) hypertension: Secondary | ICD-10-CM | POA: Diagnosis not present

## 2021-02-11 DIAGNOSIS — G8929 Other chronic pain: Secondary | ICD-10-CM | POA: Diagnosis not present

## 2021-02-11 DIAGNOSIS — K219 Gastro-esophageal reflux disease without esophagitis: Secondary | ICD-10-CM | POA: Diagnosis not present

## 2021-02-11 DIAGNOSIS — N529 Male erectile dysfunction, unspecified: Secondary | ICD-10-CM | POA: Diagnosis not present

## 2021-02-11 DIAGNOSIS — M4800 Spinal stenosis, site unspecified: Secondary | ICD-10-CM | POA: Diagnosis not present

## 2021-02-11 DIAGNOSIS — N3281 Overactive bladder: Secondary | ICD-10-CM | POA: Diagnosis not present

## 2021-02-14 NOTE — Progress Notes (Addendum)
NEUROLOGY CONSULTATION NOTE  Paul [name] MRN: [id number] DOB: August 15, 1934  Referring provider: Alvester Chou, MD (ED referral) Primary care provider: Denny Levy, MD  Reason for consult:  headaches  Assessment/Plan:   New onset headache over 52.  CT results reassuring but given his age and no prior history of headache, will check MRI.  Not consistent with giant cell arteritis, migraine and not really consistent with tension type headache.  Consider nummular headache  MRI of brain with and without contrast.  Further recommendations pending results.  As symptoms are gradually improving, defer starting a preventative but consider gabapentin if headache worsens.  Otherwise, if MRI unremarkable and clinically doing well, follow up as needed.  03/08/2021 ADDENDUM:  MRI Brain W WO performed today personally reviewed and was unremarkable.     Subjective:  Paul [name] is an 86 year old right-handed male with HTN and CKD who presents for headaches.  History supplemented by ED note.  In July, he developed a sudden onset headache described as a dull non-throbbing pain on the top and crown of his head.  It was at first a daily and persistent but fluctuating throughout the day.  No associated visual disturbance, scalp paresthesias, neck pain, nausea, vomiting, dizziness, photophobia, phonophobia, numbness or weakness.  No preceding trauma.  He has macular degeneration and at the time, he was on a road trip and driving long distances which caused strain on his eyes.  It seemed to worsen during these drives.  Rest helped.   He was seen in the ED on 11/24/2020 for evaluation.  CT and CTV of head personally reviewed showed chronic small vessel ischemic changes but no acute stroke, bleed, mass lesion, dural venous thrombosis or other acute intracranial abnormality.  Over the past several weeks, the headache has improved as in it is not as intense and occurs less frequently, no longer daily.   PAST MEDICAL  HISTORY: Past Medical History:  Diagnosis Date   GERD (gastroesophageal reflux disease)    Hiatal hernia    History of cataract    Hypertension     PAST SURGICAL HISTORY: Past Surgical History:  Procedure Laterality Date   CATARACT EXTRACTION, BILATERAL     TOTAL KNEE ARTHROPLASTY     left   TRANSURETHRAL RESECTION OF PROSTATE  2011    MEDICATIONS: Current Outpatient Medications on File Prior to Visit  Medication Sig Dispense Refill   allopurinol (ZYLOPRIM) 100 MG tablet TAKE 2 TABLETS(200 MG) BY MOUTH DAILY 180 tablet 3   aspirin 81 MG chewable tablet Chew 81 mg by mouth daily.     Cholecalciferol (VITAMIN D3 PO) Take 4,000 Units by mouth daily.     cloNIDine (CATAPRES - DOSED IN MG/24 HR) 0.3 mg/24hr patch APPLY 1 PATCH EVERY WEEK 12 patch 3   Cyanocobalamin (VITAMIN B-12 PO) Take 1 capsule by mouth daily.     diclofenac sodium (VOLTAREN) 1 % GEL Apply 2 g topically 4 (four) times daily. 150 g 1   felodipine (PLENDIL) 10 MG 24 hr tablet TAKE 1 TABLET(10 MG) BY MOUTH DAILY 90 tablet 3   fluticasone (FLONASE) 50 MCG/ACT nasal spray Place 2 sprays into both nostrils daily. 16 g 6   hydrochlorothiazide (HYDRODIURIL) 25 MG tablet TAKE 1/2 TABLET BY MOUTH DAILY 90 tablet 3   loratadine (CLARITIN) 10 MG tablet Take 1 tablet (10 mg total) by mouth daily. 30 tablet 11   mirabegron ER (MYRBETRIQ) 25 MG TB24 tablet Take 1 tablet (25 mg total) by mouth daily.  90 tablet 3   Multiple Vitamins-Minerals (ICAPS AREDS 2 PO) Take 1 capsule by mouth 2 (two) times a day.     pantoprazole (PROTONIX) 40 MG tablet TAKE 1 TABLET(40 MG) BY MOUTH TWICE DAILY BEFORE A MEAL 60 tablet 6   polyethylene glycol powder (GLYCOLAX/MIRALAX) 17 GM/SCOOP powder TAKE 17 GM(DISSSOLVED IN WATER) BY MOUTH ONCE DAILY 952 g 12   pravastatin (PRAVACHOL) 40 MG tablet TAKE 1 TABLET(40 MG) BY MOUTH DAILY 90 tablet 3   predniSONE (DELTASONE) 20 MG tablet Take 1 tablet (20 mg total) by mouth daily with breakfast. 5 tablet 0    Pyridoxine HCl (VITAMIN B6 PO) Take 1 capsule by mouth daily.     sildenafil (REVATIO) 20 MG tablet TAKE ONE TABLET BY MOUTH AS DIRECTED FOR ERECTILE DYSFUNCTION 30 tablet 0   Tadalafil 2.5 MG TABS      tiZANidine (ZANAFLEX) 2 MG tablet Take 1 tablet (2 mg total) by mouth at bedtime. 30 tablet 0   Zoster Vaccine Adjuvanted [facility]) injection Inject 0.5 ml for first dose and then repeat once per immunization schedule as directed for second final dose 0.5 mL 1   No current facility-administered medications on file prior to visit.    ALLERGIES: Allergies  Allergen Reactions   Ace Inhibitors Swelling    After taking for long time had (facial) angioedema on lisinopril    FAMILY HISTORY: Family History  Problem Relation Age of Onset   Diabetes Mother    Kidney disease Father    Cancer Sister        ? pancreatic   Heart disease Brother    Cancer Sister        ? pancreatic   Colon cancer Neg Hx    Stomach cancer Neg Hx    Rectal cancer Neg Hx    Esophageal cancer Neg Hx    Liver cancer Neg Hx     Objective:  Blood pressure (!) 156/87, pulse 68, height 5\' 9"  (1.753 m), weight 190 lb 3.2 oz (86.3 kg), SpO2 99 %. General: No acute distress.  Patient appears well-groomed.   Head:  Normocephalic/atraumatic Eyes:  fundi examined but not visualized Neck: supple, no paraspinal tenderness, full range of motion Back: No paraspinal tenderness Heart: regular rate and rhythm Lungs: Clear to auscultation bilaterally. Vascular: No carotid bruits. Neurological Exam: Mental status: alert and oriented to person, place, and time, recent and remote memory intact, fund of knowledge intact, attention and concentration intact, speech fluent and not dysarthric, language intact. Cranial nerves: CN I: not tested CN II: pupils equal, round and reactive to light, visual fields intact CN III, IV, VI:  full range of motion, no nystagmus, no ptosis CN V: facial sensation intact. CN VII: upper and lower  face symmetric CN VIII: hearing intact CN IX, X: gag intact, uvula midline CN XI: sternocleidomastoid and trapezius muscles intact CN XII: tongue midline Bulk & Tone: normal, no fasciculations. Motor:  muscle strength 5/5 throughout Sensation:  Pinprick sensation intact; vibratory sensation reduced in toes Deep Tendon Reflexes:  2+ throughout,  toes downgoing.   Finger to nose testing:  Without dysmetria.   Heel to shin:  Without dysmetria.   Gait:  Normal station and stride.  Romberg negative.    Thank you for allowing me to take part in the care of this patient.  , DO  CC: Shon Millet, MD

## 2021-02-15 ENCOUNTER — Ambulatory Visit: Payer: Medicare PPO | Admitting: Neurology

## 2021-02-15 ENCOUNTER — Other Ambulatory Visit: Payer: Self-pay

## 2021-02-15 ENCOUNTER — Encounter: Payer: Self-pay | Admitting: Neurology

## 2021-02-15 VITALS — BP 156/87 | HR 68 | Ht 69.0 in | Wt 190.2 lb

## 2021-02-15 DIAGNOSIS — R519 Headache, unspecified: Secondary | ICD-10-CM

## 2021-02-15 NOTE — Patient Instructions (Signed)
MRI of brain with and without contrast Further recommendations pending results.  

## 2021-02-25 DIAGNOSIS — N3281 Overactive bladder: Secondary | ICD-10-CM | POA: Diagnosis not present

## 2021-02-25 DIAGNOSIS — N401 Enlarged prostate with lower urinary tract symptoms: Secondary | ICD-10-CM | POA: Diagnosis not present

## 2021-02-25 DIAGNOSIS — R351 Nocturia: Secondary | ICD-10-CM | POA: Diagnosis not present

## 2021-02-25 DIAGNOSIS — N5201 Erectile dysfunction due to arterial insufficiency: Secondary | ICD-10-CM | POA: Diagnosis not present

## 2021-03-06 ENCOUNTER — Other Ambulatory Visit: Payer: Self-pay

## 2021-03-06 DIAGNOSIS — M5416 Radiculopathy, lumbar region: Secondary | ICD-10-CM

## 2021-03-08 ENCOUNTER — Other Ambulatory Visit: Payer: Self-pay

## 2021-03-08 ENCOUNTER — Ambulatory Visit
Admission: RE | Admit: 2021-03-08 | Discharge: 2021-03-08 | Disposition: A | Discharge status: Home / Self Care | Payer: Medicare PPO | Source: Ambulatory Visit | Attending: Neurology | Admitting: Neurology

## 2021-03-08 DIAGNOSIS — J3489 Other specified disorders of nose and nasal sinuses: Secondary | ICD-10-CM | POA: Diagnosis not present

## 2021-03-08 DIAGNOSIS — R519 Headache, unspecified: Secondary | ICD-10-CM | POA: Diagnosis not present

## 2021-03-08 MED ORDER — GADOBENATE DIMEGLUMINE 529 MG/ML IV SOLN
18.0000 mL | Freq: Once | INTRAVENOUS | Status: AC | PRN
  Administered 2021-03-08: 18 mL via INTRAVENOUS

## 2021-03-08 NOTE — Progress Notes (Signed)
LMOVM for the patient to call the office back.  °

## 2021-03-12 ENCOUNTER — Other Ambulatory Visit: Payer: Self-pay

## 2021-03-12 ENCOUNTER — Ambulatory Visit
Admission: RE | Admit: 2021-03-12 | Discharge: 2021-03-12 | Disposition: A | Discharge status: Home / Self Care | Payer: Medicare PPO | Source: Ambulatory Visit | Attending: Family Medicine | Admitting: Family Medicine

## 2021-03-12 DIAGNOSIS — M47817 Spondylosis without myelopathy or radiculopathy, lumbosacral region: Secondary | ICD-10-CM | POA: Diagnosis not present

## 2021-03-12 DIAGNOSIS — M5416 Radiculopathy, lumbar region: Secondary | ICD-10-CM

## 2021-03-12 MED ORDER — IOPAMIDOL (ISOVUE-M 200) INJECTION 41%
1.0000 mL | Freq: Once | INTRAMUSCULAR | Status: AC
  Administered 2021-03-12: 1 mL via EPIDURAL

## 2021-03-12 MED ORDER — METHYLPREDNISOLONE ACETATE 40 MG/ML INJ SUSP (RADIOLOG
80.0000 mg | Freq: Once | INTRAMUSCULAR | Status: AC
  Administered 2021-03-12: 80 mg via EPIDURAL

## 2021-03-12 NOTE — Progress Notes (Signed)
Patient advised of his Mri Brain.

## 2021-03-12 NOTE — Discharge Instructions (Addendum)

## 2021-03-14 ENCOUNTER — Ambulatory Visit: Payer: Medicare PPO | Admitting: Family Medicine

## 2021-03-18 DIAGNOSIS — N401 Enlarged prostate with lower urinary tract symptoms: Secondary | ICD-10-CM | POA: Diagnosis not present

## 2021-03-18 DIAGNOSIS — R351 Nocturia: Secondary | ICD-10-CM | POA: Diagnosis not present

## 2021-03-18 DIAGNOSIS — N3281 Overactive bladder: Secondary | ICD-10-CM | POA: Diagnosis not present

## 2021-03-19 ENCOUNTER — Telehealth: Payer: Self-pay | Admitting: Family Medicine

## 2021-03-19 ENCOUNTER — Other Ambulatory Visit: Payer: Self-pay

## 2021-03-19 MED ORDER — CLONIDINE 0.3 MG/24HR TD PTWK: MEDICATED_PATCH | TRANSDERMAL | 3 refills | Status: DC

## 2021-03-19 NOTE — Telephone Encounter (Signed)
Patient called asking if Dr Katrinka Blazing could refill his cloNIDine (CATAPRES - DOSED IN MG/24 HR) 0.3 mg/24hr patch prescription. He said that he has requested this from his PCP but is having trouble getting a response. He was concerned about have to wait even longer with the holiday coming up and has already been out a few days.  I told him that I did not know if Dr Katrinka Blazing would be able to fill this or not.  Please advise.

## 2021-03-19 NOTE — Telephone Encounter (Signed)
Looks like it was filled today correct?

## 2021-03-20 NOTE — Telephone Encounter (Signed)
Spoke with patient to let him know PCP refilled medication.

## 2021-04-01 DIAGNOSIS — H04123 Dry eye syndrome of bilateral lacrimal glands: Secondary | ICD-10-CM | POA: Diagnosis not present

## 2021-04-01 DIAGNOSIS — H353131 Nonexudative age-related macular degeneration, bilateral, early dry stage: Secondary | ICD-10-CM | POA: Diagnosis not present

## 2021-04-01 DIAGNOSIS — H35033 Hypertensive retinopathy, bilateral: Secondary | ICD-10-CM | POA: Diagnosis not present

## 2021-04-01 DIAGNOSIS — H26493 Other secondary cataract, bilateral: Secondary | ICD-10-CM | POA: Diagnosis not present

## 2021-04-15 NOTE — Progress Notes (Signed)
[name] [address] [place] [address] Phone: [phone] Subjective:   Paul [name], am serving as a scribe for Dr. Antoine [name]. This visit occurred during the SARS-CoV-2 public health emergency.  Safety protocols were in place, including screening questions prior to the visit, additional usage of staff PPE, and extensive cleaning of exam room while observing appropriate contact time as indicated for disinfecting solutions.   I'm seeing this patient by the request  of:  Paul Ramp, MD  CC: Low back pain follow-up  [id number]  Paul [name] is a 86 y.o. male coming in with complaint of back and neck pain. OMT 01/31/2021. Epidural 03/12/2021. Patient states no relief after getting epidural. No place that is particularly bothersome. No new complaints.  Medications patient has been prescribed: None  Taking:         Reviewed prior external information including notes and imaging from previsou exam, outside providers and external EMR if available.   As well as notes that were available from care everywhere and other healthcare systems.  Past medical history, social, surgical and family history all reviewed in electronic medical record.  No pertanent information unless stated regarding to the chief complaint.   Past Medical History:  Diagnosis Date   GERD (gastroesophageal reflux disease)    Hiatal hernia    History of cataract    Hypertension     Allergies  Allergen Reactions   Ace Inhibitors Swelling    After taking for long time had (facial) angioedema on lisinopril     Review of Systems:  No headache, visual changes, nausea, vomiting, diarrhea, constipation, dizziness, abdominal pain, skin rash, fevers, chills, night sweats, weight loss, swollen lymph nodes, body aches, joint swelling, chest pain, shortness of breath, mood changes. POSITIVE muscle aches  Objective  Blood pressure (!) 146/80, pulse 73, height 5\' 9"  (1.753  m), weight 194 lb (88 kg), SpO2 97 %.   General: No apparent distress alert and oriented x3 mood and affect normal, dressed appropriately.  HEENT: Pupils equal, extraocular movements intact  Respiratory: Patient's speak in full sentences and does not appear short of breath  Cardiovascular: No lower extremity edema, non tender, no erythema  Low back exam does have significant loss of lordosis.  Increasing tightness noted with straight leg test but no true radicular symptoms.  Patient noted does feel that he has more tightness on the side of his hips and has had some more range of motion decrease in internal and external range of motion.  Patient does have only 5 degrees of extension of the back.  He does seem to be neurovascularly intact distally though.  Osteopathic findings  T9 extended rotated and side bent left L2 flexed rotated and side bent right Sacrum right on right       Assessment and Plan:  Spinal stenosis of lumbar region with radiculopathy Worsening symptoms at this time.  Did not respond as well to the epidural which is what he has done previously.  Patient would like to try another 1 mL at this time but I do think could be beneficial.  Continues to respond relatively well though to also osteopathic manipulation.  Patient wants to hold on any more medications.  Concern for more of the side effects.  Patient continues to stay active otherwise.  Follow-up with me again in 6 weeks   Nonallopathic problems  Decision today to treat with OMT was based on Physical Exam  After verbal consent patient  was treated with HVLA, ME, FPR techniques in  thoracic, lumbar, and sacral  areas  Patient tolerated the procedure well with improvement in symptoms  Patient given exercises, stretches and lifestyle modifications  See medications in patient instructions if given  Patient will follow up in 4-8 weeks     The above documentation has been reviewed and is accurate and complete  Paul Saa, DO        Note: This dictation was prepared with Dragon dictation along with smaller phrase technology. Any transcriptional errors that result from this process are unintentional.

## 2021-04-16 ENCOUNTER — Other Ambulatory Visit: Payer: Self-pay

## 2021-04-16 ENCOUNTER — Ambulatory Visit: Payer: Medicare PPO | Admitting: Family Medicine

## 2021-04-16 VITALS — BP 146/80 | HR 73 | Ht 69.0 in | Wt 194.0 lb

## 2021-04-16 DIAGNOSIS — M48061 Spinal stenosis, lumbar region without neurogenic claudication: Secondary | ICD-10-CM | POA: Diagnosis not present

## 2021-04-16 DIAGNOSIS — M9902 Segmental and somatic dysfunction of thoracic region: Secondary | ICD-10-CM | POA: Diagnosis not present

## 2021-04-16 DIAGNOSIS — M9904 Segmental and somatic dysfunction of sacral region: Secondary | ICD-10-CM

## 2021-04-16 DIAGNOSIS — M5416 Radiculopathy, lumbar region: Secondary | ICD-10-CM | POA: Diagnosis not present

## 2021-04-16 DIAGNOSIS — M9903 Segmental and somatic dysfunction of lumbar region: Secondary | ICD-10-CM | POA: Diagnosis not present

## 2021-04-16 NOTE — Assessment & Plan Note (Signed)
Worsening symptoms at this time.  Did not respond as well to the epidural which is what he has done previously.  Patient would like to try another 1 mL at this time but I do think could be beneficial.  Continues to respond relatively well though to also osteopathic manipulation.  Patient wants to hold on any more medications.  Concern for more of the side effects.  Patient continues to stay active otherwise.  Follow-up with me again in 6 weeks

## 2021-04-16 NOTE — Patient Instructions (Signed)
Keep that 9 iron working Good to see you! See you again in 4 weeks

## 2021-04-30 ENCOUNTER — Encounter: Payer: Self-pay | Admitting: Family Medicine

## 2021-04-30 ENCOUNTER — Inpatient Hospital Stay
Admission: RE | Admit: 2021-04-30 | Discharge: 2021-04-30 | Disposition: A | Discharge status: Home / Self Care | Payer: Medicare PPO | Source: Ambulatory Visit | Attending: Family Medicine | Admitting: Family Medicine

## 2021-04-30 NOTE — Telephone Encounter (Signed)
Patients significant other calls nurse line reporting positive covid test this am. Paul [name] reports body aches, fatigue and fever (tmax 102.) Paul [name] reports symptoms started this morning. They are interested in antiviral therapy.  Conservative measures given to Paul [name] as well as red flags discussed.   Will forward to PCP for advisement.

## 2021-04-30 NOTE — Discharge Instructions (Signed)

## 2021-05-02 ENCOUNTER — Other Ambulatory Visit: Payer: Self-pay | Admitting: Family Medicine

## 2021-05-02 MED ORDER — PAXLOVID (150/100) 10 X 150 MG & 10 X 100MG PO TBPK: 1.0000 | ORAL_TABLET | Freq: Two times a day (BID) | ORAL | 0 refills | Status: DC

## 2021-05-02 NOTE — Progress Notes (Signed)
Spoke to pt. He received and read the Mychart message.  [facility] did not have the Paxlovid. He is able to get it from [facility]. He will pick it up today. Ottis Stain, CMA

## 2021-05-02 NOTE — Progress Notes (Signed)
Dear [facility] Team  I sent him a My Chart message but make sure he knows to read it re: I sent in his Paxlovid for COVID.  I have listed the meds he MUST stop while takng it in his My CHart Message

## 2021-05-05 ENCOUNTER — Encounter: Payer: Self-pay | Admitting: Family Medicine

## 2021-05-06 NOTE — Telephone Encounter (Signed)
Contacted pt to clarify about the medication and the 2 different pharmacies, he said that he was able to get his medicine from the [facility] and was starting to feel better but currently was still testing positive.Amaal Dimartino Zimmerman Rumple, CMA

## 2021-05-08 ENCOUNTER — Encounter: Payer: Self-pay | Admitting: Family Medicine

## 2021-05-15 NOTE — Progress Notes (Signed)
[name] [address] [place] [address] Phone: [phone] Subjective:   [name], am serving as a scribe for Dr. Antoine Primas. This visit occurred during the SARS-CoV-2 public health emergency.  Safety protocols were in place, including screening questions prior to the visit, additional usage of staff PPE, and extensive cleaning of exam room while observing appropriate contact time as indicated for disinfecting solutions.   I'm seeing this patient by the request  of:  [name], MD  CC: Low back pain  [id number]  Paul [name] is a 87 y.o. male coming in with complaint of back and neck pain. OMT 04/16/2021. Last epidural 03/12/2021. Patient states same usual spots.  Patient did have COVID.  Was not quite as active.  Has noticed some increasing tightness.  Patient was to get a little injection for his spinal stenosis but never did secondary to getting COVID.  Patient is wondering when to reschedule it.  Still having pain that is going down his legs.  Having an aching sensation in the legs too.   Medications patient has been prescribed: None          Reviewed prior external information including notes and imaging from previsou exam, outside providers and external EMR if available.   As well as notes that were available from care everywhere and other healthcare systems.  Past medical history, social, surgical and family history all reviewed in electronic medical record.  No pertanent information unless stated regarding to the chief complaint.   Past Medical History:  Diagnosis Date   GERD (gastroesophageal reflux disease)    Hiatal hernia    History of cataract    Hypertension     Allergies  Allergen Reactions   Ace Inhibitors Swelling    After taking for long time had (facial) angioedema on lisinopril     Review of Systems:  No headache, visual changes, nausea, vomiting, diarrhea, constipation, dizziness, abdominal  pain, skin rash, fevers, chills, night sweats, weight loss, swollen lymph nodes, body aches, joint swelling, chest pain, shortness of breath, mood changes. POSITIVE muscle aches  Objective  Blood pressure 122/72, pulse 76, height 5\' 9"  (1.753 m), weight 189 lb (85.7 kg), SpO2 98 %.   General: No apparent distress alert and oriented x3 mood and affect normal, dressed appropriately.  HEENT: Pupils equal, extraocular movements intact  Respiratory: Patient's speak in full sentences and does not appear short of breath  Low back exam does have some loss of lordosis.  Patient does have some degenerative scoliosis noted as well.  Osteopathic findings  C2 flexed rotated and side bent right C5 flexed rotated and side bent left T3 extended rotated and side bent right inhaled rib T8 extended rotated and side bent left L2 flexed rotated and side bent right L5 flexed rotated and side bent left Sacrum right on right       Assessment and Plan:  Spinal stenosis of lumbar region with radiculopathy Chronic problem with exacerbation.  I patient has responded well to injections previously.  Did get delayed because patient had COVID.  Patient is now over the biopsy and is able to increase activity again.  Discussed icing regimen and home exercises.  Increase activity slowly.  Patient will have the epidural and then we will see patient back again in 4 weeks afterwards.  Encourage patient continue with his home exercises.  Does have the gabapentin we have done previously and may need to restart depending on how patient  responds.    Nonallopathic problems  Decision today to treat with OMT was based on Physical Exam  After verbal consent patient was treated with HVLA, ME, FPR techniques in cervical, rib, thoracic, lumbar, and sacral  areas  Patient tolerated the procedure well with improvement in symptoms  Patient given exercises, stretches and lifestyle modifications  See medications in patient  instructions if given  Patient will follow up in 4-8 weeks      The above documentation has been reviewed and is accurate and complete [name], DO       Note: This dictation was prepared with Dragon dictation along with smaller phrase technology. Any transcriptional errors that result from this process are unintentional.

## 2021-05-16 ENCOUNTER — Ambulatory Visit: Payer: Medicare PPO | Admitting: Family Medicine

## 2021-05-16 ENCOUNTER — Other Ambulatory Visit: Payer: Self-pay

## 2021-05-16 VITALS — BP 122/72 | HR 76 | Ht 69.0 in | Wt 189.0 lb

## 2021-05-16 DIAGNOSIS — M5416 Radiculopathy, lumbar region: Secondary | ICD-10-CM

## 2021-05-16 DIAGNOSIS — M9901 Segmental and somatic dysfunction of cervical region: Secondary | ICD-10-CM | POA: Diagnosis not present

## 2021-05-16 DIAGNOSIS — M48061 Spinal stenosis, lumbar region without neurogenic claudication: Secondary | ICD-10-CM | POA: Diagnosis not present

## 2021-05-16 DIAGNOSIS — M9903 Segmental and somatic dysfunction of lumbar region: Secondary | ICD-10-CM | POA: Diagnosis not present

## 2021-05-16 DIAGNOSIS — M9904 Segmental and somatic dysfunction of sacral region: Secondary | ICD-10-CM

## 2021-05-16 DIAGNOSIS — M9902 Segmental and somatic dysfunction of thoracic region: Secondary | ICD-10-CM | POA: Diagnosis not present

## 2021-05-16 DIAGNOSIS — M9908 Segmental and somatic dysfunction of rib cage: Secondary | ICD-10-CM

## 2021-05-16 NOTE — Assessment & Plan Note (Signed)
Chronic problem with exacerbation.  I patient has responded well to injections previously.  Did get delayed because patient had COVID.  Patient is now over the biopsy and is able to increase activity again.  Discussed icing regimen and home exercises.  Increase activity slowly.  Patient will have the epidural and then we will see patient back again in 4 weeks afterwards.  Encourage patient continue with his home exercises.  Does have the gabapentin we have done previously and may need to restart depending on how patient responds.

## 2021-05-16 NOTE — Patient Instructions (Signed)
Good to see you! Call to get injection Careful when you hit the links See you again in 5 weeks

## 2021-05-27 ENCOUNTER — Other Ambulatory Visit: Payer: Self-pay

## 2021-05-27 ENCOUNTER — Ambulatory Visit
Admission: RE | Admit: 2021-05-27 | Discharge: 2021-05-27 | Disposition: A | Discharge status: Home / Self Care | Payer: Medicare PPO | Source: Ambulatory Visit | Attending: Family Medicine | Admitting: Family Medicine

## 2021-05-27 DIAGNOSIS — M5416 Radiculopathy, lumbar region: Secondary | ICD-10-CM | POA: Diagnosis not present

## 2021-05-27 DIAGNOSIS — M47816 Spondylosis without myelopathy or radiculopathy, lumbar region: Secondary | ICD-10-CM | POA: Diagnosis not present

## 2021-05-27 DIAGNOSIS — M48061 Spinal stenosis, lumbar region without neurogenic claudication: Secondary | ICD-10-CM

## 2021-05-27 MED ORDER — METHYLPREDNISOLONE ACETATE 40 MG/ML INJ SUSP (RADIOLOG
80.0000 mg | Freq: Once | INTRAMUSCULAR | Status: AC
  Administered 2021-05-27: 80 mg via EPIDURAL

## 2021-05-27 MED ORDER — IOPAMIDOL (ISOVUE-M 200) INJECTION 41%
1.0000 mL | Freq: Once | INTRAMUSCULAR | Status: AC
  Administered 2021-05-27: 1 mL via EPIDURAL

## 2021-05-27 NOTE — Discharge Instructions (Signed)

## 2021-05-29 DIAGNOSIS — R3915 Urgency of urination: Secondary | ICD-10-CM | POA: Diagnosis not present

## 2021-05-29 DIAGNOSIS — N3281 Overactive bladder: Secondary | ICD-10-CM | POA: Diagnosis not present

## 2021-05-29 DIAGNOSIS — R351 Nocturia: Secondary | ICD-10-CM | POA: Diagnosis not present

## 2021-05-29 DIAGNOSIS — N5201 Erectile dysfunction due to arterial insufficiency: Secondary | ICD-10-CM | POA: Diagnosis not present

## 2021-06-03 DIAGNOSIS — M109 Gout, unspecified: Secondary | ICD-10-CM | POA: Diagnosis not present

## 2021-06-03 DIAGNOSIS — I10 Essential (primary) hypertension: Secondary | ICD-10-CM | POA: Diagnosis not present

## 2021-06-03 DIAGNOSIS — K5900 Constipation, unspecified: Secondary | ICD-10-CM | POA: Diagnosis not present

## 2021-06-03 DIAGNOSIS — H3530 Unspecified macular degeneration: Secondary | ICD-10-CM | POA: Diagnosis not present

## 2021-06-03 DIAGNOSIS — M1990 Unspecified osteoarthritis, unspecified site: Secondary | ICD-10-CM | POA: Diagnosis not present

## 2021-06-03 DIAGNOSIS — E785 Hyperlipidemia, unspecified: Secondary | ICD-10-CM | POA: Diagnosis not present

## 2021-06-03 DIAGNOSIS — G8929 Other chronic pain: Secondary | ICD-10-CM | POA: Diagnosis not present

## 2021-06-03 DIAGNOSIS — K219 Gastro-esophageal reflux disease without esophagitis: Secondary | ICD-10-CM | POA: Diagnosis not present

## 2021-06-03 DIAGNOSIS — N3281 Overactive bladder: Secondary | ICD-10-CM | POA: Diagnosis not present

## 2021-06-18 NOTE — Progress Notes (Signed)
[name] [address] [place] [address] Phone: [phone] Subjective:   [name], am serving as a scribe for Dr. Antoine Primas. This visit occurred during the SARS-CoV-2 public health emergency.  Safety protocols were in place, including screening questions prior to the visit, additional usage of staff PPE, and extensive cleaning of exam room while observing appropriate contact time as indicated for disinfecting solutions.   I'm seeing this patient by the request  of:  [name], MD  CC: back and neck pain   [id number]  Karol Liendo is a 87 y.o. male coming in with complaint of back and neck pain. OMT 05/16/2021. Patient states doing a little better. No other complaints.  Medications patient has been prescribed: None  Taking:         Reviewed prior external information including notes and imaging from previsou exam, outside providers and external EMR if available.   As well as notes that were available from care everywhere and other healthcare systems.  Past medical history, social, surgical and family history all reviewed in electronic medical record.  No pertanent information unless stated regarding to the chief complaint.   Past Medical History:  Diagnosis Date   GERD (gastroesophageal reflux disease)    Hiatal hernia    History of cataract    Hypertension     Allergies  Allergen Reactions   Ace Inhibitors Swelling    After taking for long time had (facial) angioedema on lisinopril     Review of Systems:  No headache, visual changes, nausea, vomiting, diarrhea, constipation, dizziness, abdominal pain, skin rash, fevers, chills, night sweats, weight loss, swollen lymph nodes, body aches, joint swelling, chest pain, shortness of breath, mood changes. POSITIVE muscle aches  Objective  Blood pressure 122/68, pulse 83, height 5\' 9"  (1.753 m), weight 188 lb (85.3 kg), SpO2 96 %.   General: No apparent distress alert  and oriented x3 mood and affect normal, dressed appropriately.  HEENT: Pupils equal, extraocular movements intact  Respiratory: Patient's speak in full sentences and does not appear short of breath  Cardiovascular: No lower extremity edema, non tender, no erythema  Low back exam still has significant tightness noted, does lack the last 10 degrees of extension.  Less tightness that with the FABER test and less tenderness over the greater trochanteric area on the left side.  Does have tightness in the parascapular region but less than previous exam as well.  Osteopathic findings  T3 extended rotated and side bent right inhaled rib T9 extended rotated and side bent left L2 flexed rotated and side bent right L5 flexed rotated and side bent left Sacrum right on right       Assessment and Plan: Spinal stenosis of lumbar region with radiculopathy Chronic problem but with the last epidural has noticed some improvement.  Patient would like to hold on her third epidural at this point.  Discussed icing regimen and home exercises discussed which activities to do and which ones to avoid.  Increase activity slowly.  Follow-up again in 6 to 8 weeks.     Nonallopathic problems  Decision today to treat with OMT was based on Physical Exam  After verbal consent patient was treated with HVLA, ME, FPR techniques in  thoracic, lumbar, and sacral  areas  Patient tolerated the procedure well with improvement in symptoms  Patient given exercises, stretches and lifestyle modifications  See medications in patient instructions if given  Patient will follow up in  4-8 weeks      The above documentation has been reviewed and is accurate and complete [name], DO        Note: This dictation was prepared with Dragon dictation along with smaller phrase technology. Any transcriptional errors that result from this process are unintentional.

## 2021-06-20 ENCOUNTER — Other Ambulatory Visit: Payer: Self-pay

## 2021-06-20 ENCOUNTER — Ambulatory Visit: Payer: Medicare PPO | Admitting: Family Medicine

## 2021-06-20 VITALS — BP 122/68 | HR 83 | Ht 69.0 in | Wt 188.0 lb

## 2021-06-20 DIAGNOSIS — M9902 Segmental and somatic dysfunction of thoracic region: Secondary | ICD-10-CM | POA: Diagnosis not present

## 2021-06-20 DIAGNOSIS — M9903 Segmental and somatic dysfunction of lumbar region: Secondary | ICD-10-CM | POA: Diagnosis not present

## 2021-06-20 DIAGNOSIS — M48061 Spinal stenosis, lumbar region without neurogenic claudication: Secondary | ICD-10-CM

## 2021-06-20 DIAGNOSIS — M5416 Radiculopathy, lumbar region: Secondary | ICD-10-CM | POA: Diagnosis not present

## 2021-06-20 DIAGNOSIS — M9904 Segmental and somatic dysfunction of sacral region: Secondary | ICD-10-CM | POA: Diagnosis not present

## 2021-06-20 NOTE — Assessment & Plan Note (Signed)
Chronic problem but with the last epidural has noticed some improvement.  Patient would like to hold on her third epidural at this point.  Discussed icing regimen and home exercises discussed which activities to do and which ones to avoid.  Increase activity slowly.  Follow-up again in 6 to 8 weeks.

## 2021-06-20 NOTE — Patient Instructions (Signed)
Good to see you! GO beat the crap out of that golf ball See you again in 4-6 weeks

## 2021-06-26 ENCOUNTER — Encounter: Payer: Self-pay | Admitting: Family Medicine

## 2021-06-28 ENCOUNTER — Other Ambulatory Visit: Payer: Self-pay | Admitting: Family Medicine

## 2021-06-28 MED ORDER — HYDROCHLOROTHIAZIDE 25 MG PO TABS: ORAL_TABLET | ORAL | 3 refills | Status: DC

## 2021-06-28 NOTE — Telephone Encounter (Signed)
Patient returns call to nurse line regarding previous mychart message.  ? ?Patient is requesting that rx be sent to the [facility] on [facility], as [address] is closed on Saturday.  ? ?Please advise.  ? ?Talbot Grumbling, RN ? ?

## 2021-07-03 ENCOUNTER — Encounter: Payer: Self-pay | Admitting: Family Medicine

## 2021-07-03 MED ORDER — POLYETHYLENE GLYCOL 3350 17 GM/SCOOP PO POWD: ORAL | 12 refills | Status: DC

## 2021-07-09 DIAGNOSIS — H353131 Nonexudative age-related macular degeneration, bilateral, early dry stage: Secondary | ICD-10-CM | POA: Diagnosis not present

## 2021-07-09 DIAGNOSIS — H35033 Hypertensive retinopathy, bilateral: Secondary | ICD-10-CM | POA: Diagnosis not present

## 2021-07-09 DIAGNOSIS — Z961 Presence of intraocular lens: Secondary | ICD-10-CM | POA: Diagnosis not present

## 2021-07-09 DIAGNOSIS — H35453 Secondary pigmentary degeneration, bilateral: Secondary | ICD-10-CM | POA: Diagnosis not present

## 2021-07-09 DIAGNOSIS — H35363 Drusen (degenerative) of macula, bilateral: Secondary | ICD-10-CM | POA: Diagnosis not present

## 2021-07-13 ENCOUNTER — Other Ambulatory Visit: Payer: Self-pay | Admitting: Internal Medicine

## 2021-07-24 NOTE — Progress Notes (Signed)
?Charlann Boxer D.O. ?[facility] ?[place] ?Phone: [phone] ?Subjective:   ?I, Vilma Meckel, am serving as a Education administrator for Dr. Hulan Saas. ?This visit occurred during the SARS-CoV-2 public health emergency.  Safety protocols were in place, including screening questions prior to the visit, additional usage of staff PPE, and extensive cleaning of exam room while observing appropriate contact time as indicated for disinfecting solutions.  ? ?I'm seeing this patient by the request  of:  [name], MD ? ?CC: neck and back pain  ? ?[id number]  ?Paul [name] is a 87 y.o. male coming in with complaint of back and neck pain. OMT 06/20/2021. Patient states doing well. Has some aches and pains. No other complaints. ? ?Medications patient has been prescribed: None ? ? ? ?  ? ? ? ? ?Reviewed prior external information including notes and imaging from previsou exam, outside providers and external EMR if available.  ? ?As well as notes that were available from care everywhere and other healthcare systems. ? ?Past medical history, social, surgical and family history all reviewed in electronic medical record.  No pertanent information unless stated regarding to the chief complaint.  ? ?Past Medical History:  ?Diagnosis Date  ? GERD (gastroesophageal reflux disease)   ? Hiatal hernia   ? History of cataract   ? Hypertension   ?  ?Allergies  ?Allergen Reactions  ? Ace Inhibitors Swelling  ?  After taking for long time had (facial) angioedema on lisinopril  ? ? ? ?Review of Systems: ? No headache, visual changes, nausea, vomiting, diarrhea, constipation, dizziness, abdominal pain, skin rash, fevers, chills, night sweats, weight loss, swollen lymph nodes, body aches, joint swelling, chest pain, shortness of breath, mood changes. POSITIVE muscle aches ? ?Objective  ?Blood pressure (!) 150/82, pulse 72, height 5' 9"  (1.753 m), weight 188 lb (85.3 kg), SpO2 99 %. ?  ?General: No apparent  distress alert and oriented x3 mood and affect normal, dressed appropriately.  ?HEENT: Pupils equal, extraocular movements intact  ?Respiratory: Patient's speak in full sentences and does not appear short of breath  ?Cardiovascular: No lower extremity edema, non tender, no erythema  ?Loss of lordosis of lumbar spine patient does have some tightness noted with FABER test as well.  Lacks last 10 degrees of extension. ? ?Osteopathic findings ? ?C2 flexed rotated and side bent right ?C7 flexed rotated and side bent right  ?T3 extended rotated and side bent right inhaled rib ?T9 extended rotated and side bent left ?L2 flexed rotated and side bent right ?L4 flexed rotated and side bent right ?L5 flexed rotated and side bent left ?Sacrum right on right ? ? ? ? ?  ?Assessment and Plan: ? ?Spinal stenosis of lumbar region with radiculopathy ?Spinal stenosis.  Patient is having some chronic issues with some mild exacerbation.  Discussed again about the potential for another injection.  Patient feels like he would like to hold off at this time.  Discussed which activities to do which ones to avoid.  We will continue the same medications.  Patient will continue with his tai chi follow-up with me again in 7 to 8 weeks  ? ?Nonallopathic problems ? ?Decision today to treat with OMT was based on Physical Exam ? ?After verbal consent patient was treated with HVLA, ME, FPR techniques in cervical, rib, thoracic, lumbar, and sacral  areas ? ?Patient tolerated the procedure well with improvement in symptoms ? ?Patient given exercises, stretches and lifestyle modifications ? ?  See medications in patient instructions if given ? ?Patient will follow up in 4-8 weeks ? ?  ? ? ?The above documentation has been reviewed and is accurate and complete Lyndal Pulley, DO ? ? ? ?  ? ? Note: This dictation was prepared with Dragon dictation along with smaller phrase technology. Any transcriptional errors that result from this process are  unintentional.    ?  ?  ? ?

## 2021-07-25 ENCOUNTER — Ambulatory Visit: Payer: Medicare PPO | Admitting: Family Medicine

## 2021-07-25 VITALS — BP 150/82 | HR 72 | Ht 69.0 in | Wt 188.0 lb

## 2021-07-25 DIAGNOSIS — M5416 Radiculopathy, lumbar region: Secondary | ICD-10-CM

## 2021-07-25 DIAGNOSIS — M9901 Segmental and somatic dysfunction of cervical region: Secondary | ICD-10-CM

## 2021-07-25 DIAGNOSIS — M9902 Segmental and somatic dysfunction of thoracic region: Secondary | ICD-10-CM | POA: Diagnosis not present

## 2021-07-25 DIAGNOSIS — M9908 Segmental and somatic dysfunction of rib cage: Secondary | ICD-10-CM | POA: Diagnosis not present

## 2021-07-25 DIAGNOSIS — M9904 Segmental and somatic dysfunction of sacral region: Secondary | ICD-10-CM | POA: Diagnosis not present

## 2021-07-25 DIAGNOSIS — M48061 Spinal stenosis, lumbar region without neurogenic claudication: Secondary | ICD-10-CM | POA: Diagnosis not present

## 2021-07-25 DIAGNOSIS — M9903 Segmental and somatic dysfunction of lumbar region: Secondary | ICD-10-CM

## 2021-07-25 NOTE — Patient Instructions (Signed)
Good to see you! ?Keeping being [place] ?Good luck with the dog ?Keep up the birdie ?See you again in 7 weeks ?

## 2021-07-25 NOTE — Assessment & Plan Note (Signed)
Spinal stenosis.  Patient is having some chronic issues with some mild exacerbation.  Discussed again about the potential for another injection.  Patient feels like he would like to hold off at this time.  Discussed which activities to do which ones to avoid.  We will continue the same medications.  Patient will continue with his tai chi follow-up with me again in 7 to 8 weeks ?

## 2021-07-31 ENCOUNTER — Encounter: Payer: Self-pay | Admitting: Family Medicine

## 2021-08-01 ENCOUNTER — Other Ambulatory Visit: Payer: Self-pay

## 2021-08-01 DIAGNOSIS — M5416 Radiculopathy, lumbar region: Secondary | ICD-10-CM

## 2021-08-07 ENCOUNTER — Encounter: Payer: Medicare PPO | Admitting: Family Medicine

## 2021-08-14 ENCOUNTER — Encounter: Payer: Self-pay | Admitting: Family Medicine

## 2021-08-14 ENCOUNTER — Ambulatory Visit (INDEPENDENT_AMBULATORY_CARE_PROVIDER_SITE_OTHER): Payer: Medicare PPO | Admitting: Family Medicine

## 2021-08-14 VITALS — BP 126/68 | HR 64 | Wt 184.4 lb

## 2021-08-14 DIAGNOSIS — N1832 Chronic kidney disease, stage 3b: Secondary | ICD-10-CM

## 2021-08-14 DIAGNOSIS — F5232 Male orgasmic disorder: Secondary | ICD-10-CM | POA: Diagnosis not present

## 2021-08-14 DIAGNOSIS — I10 Essential (primary) hypertension: Secondary | ICD-10-CM | POA: Diagnosis not present

## 2021-08-14 DIAGNOSIS — Z0000 Encounter for general adult medical examination without abnormal findings: Secondary | ICD-10-CM

## 2021-08-14 DIAGNOSIS — Z125 Encounter for screening for malignant neoplasm of prostate: Secondary | ICD-10-CM | POA: Diagnosis not present

## 2021-08-14 DIAGNOSIS — M5137 Other intervertebral disc degeneration, lumbosacral region: Secondary | ICD-10-CM

## 2021-08-14 NOTE — Patient Instructions (Signed)
Great to see you!   

## 2021-08-15 LAB — COMPREHENSIVE METABOLIC PANEL
ALT: 13 IU/L (ref 0–44)
AST: 22 IU/L (ref 0–40)
Albumin/Globulin Ratio: 1.5 (ref 1.2–2.2)
Albumin: 4.7 g/dL — ABNORMAL HIGH (ref 3.6–4.6)
Alkaline Phosphatase: 73 IU/L (ref 44–121)
BUN/Creatinine Ratio: 20 (ref 10–24)
BUN: 34 mg/dL — ABNORMAL HIGH (ref 8–27)
Bilirubin Total: 0.4 mg/dL (ref 0.0–1.2)
CO2: 28 mmol/L (ref 20–29)
Calcium: 10.1 mg/dL (ref 8.6–10.2)
Chloride: 97 mmol/L (ref 96–106)
Creatinine, Ser: 1.66 mg/dL — ABNORMAL HIGH (ref 0.76–1.27)
Globulin, Total: 3.1 g/dL (ref 1.5–4.5)
Glucose: 104 mg/dL — ABNORMAL HIGH (ref 70–99)
Potassium: 3.4 mmol/L — ABNORMAL LOW (ref 3.5–5.2)
Sodium: 143 mmol/L (ref 134–144)
Total Protein: 7.8 g/dL (ref 6.0–8.5)
eGFR: 40 mL/min/{1.73_m2} — ABNORMAL LOW (ref >59)

## 2021-08-15 LAB — LDL CHOLESTEROL, DIRECT: LDL Direct: 131 mg/dL — ABNORMAL HIGH (ref 0–99)

## 2021-08-15 LAB — PSA: Prostate Specific Ag, Serum: 4.7 ng/mL — ABNORMAL HIGH (ref 0.0–4.0)

## 2021-08-16 ENCOUNTER — Encounter: Payer: Self-pay | Admitting: Family Medicine

## 2021-08-16 NOTE — Assessment & Plan Note (Signed)
[facility] up to date except has not yet gotten zoster vaccine. Still planning  To do it ?

## 2021-08-16 NOTE — Assessment & Plan Note (Signed)
Will switch back to sildenafil prn ?Max dose is 100 mg a day and we discussed ?

## 2021-08-16 NOTE — Assessment & Plan Note (Signed)
Labs today

## 2021-08-16 NOTE — Progress Notes (Signed)
? ? ?  CHIEF COMPLAINT / HPI: ? Well check up ?Only current issue is low back pain. He has started wearing a "belt" for SI/pubic symphisis pain an it ie helping. Continue to follow with Dr. Tamala Julian and has decided to have another ESI. This will be his third in 12 months. Usually 1 shot lasts about a year so he is a little worried. ?Saw his urologist. He had been rx daily cialis but it was giving him a headache. Has stopped it.  Not noticed any significant worsening of urinary issues. Plans to use sildenafil prn for sexual function. Wants to know max dose of that. ? ? ?PERTINENT  PMH / PSH: I have reviewed the patient?s medications, allergies, past medical and surgical history, smoking status and updated in the EMR as appropriate. ? ? ?OBJECTIVE: ? BP 126/68   Pulse 64   Wt 184 lb 6.4 oz (83.6 kg)   SpO2 98%   BMI 27.23 kg/m?  ?Vital signs reviewed. ?GENERAL: Well-developed, well-nourished, no acute distress. ?CARDIOVASCULAR: Regular rate and rhythm no murmur gallop or rub ?LUNGS: Clear to auscultation bilaterally, no rales or wheeze. ?ABDOMEN: Soft positive bowel sounds ?NEURO: No gross focal neurological deficits. ?MSK: Movement of extremity x 4. ?NECK no LAD. Normal thyroid on palpation. No bruits ?PSYCH: AxOx4. Good eye contact.. No psychomotor retardation or agitation. Appropriate speech fluency and content. Asks and answers questions appropriately. Mood is congruent. ? ? ? ?ASSESSMENT / PLAN: ? ? ?HYPERTENSION, BENIGN SYSTEMIC ?Good control ?Labs today ? ?CHRONIC KIDNEY DISEASE STAGE III (MODERATE) ?Labs today ? ?ERECTILE DYSFUNCTION ?Will switch back to sildenafil prn ?Max dose is 100 mg a day and we discussed ? ?DEGENERATIVE DISC DISEASE, LUMBAR SPINE ?Dicussed ESI ?Hopeful I t will help ? ?Well adult exam ?[facility] up to date except has not yet gotten zoster vaccine. Still planning  To do it ?  ?Dorcas Mcmurray MD ?

## 2021-08-16 NOTE — Assessment & Plan Note (Signed)
Dicussed ESI ?Hopeful I t will help ?

## 2021-08-16 NOTE — Assessment & Plan Note (Signed)
Good control ?Labs today ?

## 2021-08-19 ENCOUNTER — Encounter: Payer: Self-pay | Admitting: Family Medicine

## 2021-08-19 ENCOUNTER — Ambulatory Visit
Admission: RE | Admit: 2021-08-19 | Discharge: 2021-08-19 | Disposition: A | Discharge status: Home / Self Care | Payer: Medicare PPO | Source: Ambulatory Visit | Attending: Family Medicine | Admitting: Family Medicine

## 2021-08-19 DIAGNOSIS — M47817 Spondylosis without myelopathy or radiculopathy, lumbosacral region: Secondary | ICD-10-CM | POA: Diagnosis not present

## 2021-08-19 DIAGNOSIS — M5416 Radiculopathy, lumbar region: Secondary | ICD-10-CM

## 2021-08-19 MED ORDER — METHYLPREDNISOLONE ACETATE 40 MG/ML INJ SUSP (RADIOLOG
80.0000 mg | Freq: Once | INTRAMUSCULAR | Status: AC
  Administered 2021-08-19: 80 mg via EPIDURAL

## 2021-08-19 MED ORDER — IOPAMIDOL (ISOVUE-M 200) INJECTION 41%
1.0000 mL | Freq: Once | INTRAMUSCULAR | Status: AC
  Administered 2021-08-19: 1 mL via EPIDURAL

## 2021-08-19 NOTE — Discharge Instructions (Signed)
Post Procedure Spinal Discharge Instruction Sheet ? ?You may resume a regular diet and any medications that you routinely take (including pain medications) unless otherwise noted by MD. ? ?No driving day of procedure. ? ?Light activity throughout the rest of the day.  Do not do any strenuous work, exercise, bending or lifting.  The day following the procedure, you can resume normal physical activity but you should refrain from exercising or physical therapy for at least three days thereafter. ? ?You may apply ice to the injection site, 20 minutes on, 20 minutes off, as needed. Do not apply ice directly to skin.  ? ? ?Common Side Effects: ? ?Headaches- take your usual medications as directed by your physician.  Increase your fluid intake.  Caffeinated beverages may be helpful.  Lie flat in bed until your headache resolves. ? ?Restlessness or inability to sleep- you may have trouble sleeping for the next few days.  Ask your referring physician if you need any medication for sleep. ? ?Facial flushing or redness- should subside within a few days. ? ?Increased pain- a temporary increase in pain a day or two following your procedure is not unusual.  Take your pain medication as prescribed by your referring physician. ? ?Leg cramps ? ?Please contact our office at [phone] for the following symptoms: ?Fever greater than 100 degrees. ?Headaches unresolved with medication after 2-3 days. ?Increased swelling, pain, or redness at injection site. ? ? ?Thank you for visiting [facility] today.  ? ?MAY RESUME ASPIRIN IMMEDIATELY AFTER PROCEDURE!  ?

## 2021-08-20 ENCOUNTER — Encounter: Payer: Self-pay | Admitting: Family Medicine

## 2021-08-22 ENCOUNTER — Other Ambulatory Visit: Payer: Self-pay | Admitting: Family Medicine

## 2021-09-12 ENCOUNTER — Ambulatory Visit: Payer: Medicare PPO | Admitting: Family Medicine

## 2021-09-12 NOTE — Progress Notes (Signed)
[place] Phone: [phone] Subjective:   Paul Paul [name], am serving as a scribe for Dr. Hulan Saas.  This visit occurred during the SARS-CoV-2 public health emergency.  Safety protocols were in place, including screening questions prior to the visit, additional usage of staff PPE, and extensive cleaning of exam room while observing appropriate contact time as indicated for disinfecting solutions.    I'm seeing this patient by the request  of:  [name], MD  CC: back pain and neck pain   [id number]  Paul Paul [name] is a 87 y.o. male coming in with complaint of back and neck pain> OMT 07/25/2021. Patient states that he has been doing well since last visit.  Patient did have another epidural and states that that is feeling significantly better at the moment.  Patient and his back has not been this way for some time.  He is playing golf on a very regular basis.  He continues to do tai chi and multiple other things to keep him active.  Medications patient has been prescribed: None  Taking:         Reviewed prior external information including notes and imaging from previsou exam, outside providers and external EMR if available.   As well as notes that were available from care everywhere and other healthcare systems.  Past medical history, social, surgical and family history all reviewed in electronic medical record.  Paul [name] pertanent information unless stated regarding to the chief complaint.   Past Medical History:  Diagnosis Date   GERD (gastroesophageal reflux disease)    Hiatal hernia    History of cataract    Hypertension     Allergies  Allergen Reactions   Ace Inhibitors Swelling    After taking for long time had (facial) angioedema on lisinopril     Review of Systems:  Paul [name] headache, visual changes, nausea, vomiting, diarrhea, constipation, dizziness, abdominal pain, skin rash, fevers, chills,  night sweats, weight loss, swollen lymph nodes, body aches, joint swelling, chest pain, shortness of breath, mood changes. POSITIVE muscle aches  Objective  Blood pressure 134/76, pulse 77, height 5' 9"  (1.753 m), weight 184 lb (83.5 kg), SpO2 99 %.   General: Paul [name] apparent distress alert and oriented x3 mood and affect normal, dressed appropriately.  HEENT: Pupils equal, extraocular movements intact  Respiratory: Patient's speak in full sentences and does not appear short of breath  Cardiovascular: Paul [name] lower extremity edema, non tender, Paul [name] erythema  Low back still has significant discomfort noted to palpation starting in the thoracolumbar juncture going to the lumbar sacral area.  Tightness minorly around the paraspinal musculature of the scapula.  Tightness with straight leg test.  Patient has only 5 degrees of extension of the back.  Osteopathic findings   T9 extended rotated and side bent left L2 flexed rotated and side bent right L5 flexed rotated and side bent left Sacrum right on right       Assessment and Plan:  Spinal stenosis of lumbar region with radiculopathy Responding very well though to osteopathic manipulation epidural injections when necessary.  Continue to be active otherwise.  Can repeat injections if needed.  Patient with worsening pain will need to consider the possibility of surgical intervention but at the moment he would like to continue abilities to follow-up again in 6 months    Nonallopathic problems  Decision today to treat with OMT was based on Physical Exam  After verbal consent  patient was treated with HVLA, ME, FPR techniques in  thoracic, lumbar, and sacral  areas  Patient tolerated the procedure well with improvement in symptoms  Patient given exercises, stretches and lifestyle modifications  See medications in patient instructions if given  Patient will follow up in 4-8 weeks     The above documentation has been reviewed and is accurate and  complete Paul Pulley, DO        Note: This dictation was prepared with Dragon dictation along with smaller phrase technology. Any transcriptional errors that result from this process are unintentional.

## 2021-09-16 ENCOUNTER — Encounter: Payer: Self-pay | Admitting: Family Medicine

## 2021-09-16 ENCOUNTER — Ambulatory Visit: Payer: Medicare PPO | Admitting: Family Medicine

## 2021-09-16 VITALS — BP 134/76 | HR 77 | Ht 69.0 in | Wt 184.0 lb

## 2021-09-16 DIAGNOSIS — M9902 Segmental and somatic dysfunction of thoracic region: Secondary | ICD-10-CM

## 2021-09-16 DIAGNOSIS — M9904 Segmental and somatic dysfunction of sacral region: Secondary | ICD-10-CM | POA: Diagnosis not present

## 2021-09-16 DIAGNOSIS — M5416 Radiculopathy, lumbar region: Secondary | ICD-10-CM | POA: Diagnosis not present

## 2021-09-16 DIAGNOSIS — M48061 Spinal stenosis, lumbar region without neurogenic claudication: Secondary | ICD-10-CM

## 2021-09-16 DIAGNOSIS — M9903 Segmental and somatic dysfunction of lumbar region: Secondary | ICD-10-CM

## 2021-09-16 NOTE — Assessment & Plan Note (Signed)
Responding very well though to osteopathic manipulation epidural injections when necessary.  Continue to be active otherwise.  Can repeat injections if needed.  Patient with worsening pain will need to consider the possibility of surgical intervention but at the moment he would like to continue abilities to follow-up again in 6 months

## 2021-09-24 ENCOUNTER — Other Ambulatory Visit: Payer: Self-pay | Admitting: *Deleted

## 2021-09-25 MED ORDER — PRAVASTATIN SODIUM 40 MG PO TABS: ORAL_TABLET | ORAL | 3 refills | Status: DC

## 2021-09-25 MED ORDER — ALLOPURINOL 100 MG PO TABS: ORAL_TABLET | ORAL | 3 refills | Status: DC

## 2021-10-01 ENCOUNTER — Encounter: Payer: Self-pay | Admitting: *Deleted

## 2021-10-08 ENCOUNTER — Other Ambulatory Visit: Payer: Self-pay | Admitting: Family Medicine

## 2021-10-09 ENCOUNTER — Ambulatory Visit: Payer: Medicare PPO | Admitting: Family Medicine

## 2021-10-16 ENCOUNTER — Other Ambulatory Visit: Payer: Self-pay

## 2021-10-16 ENCOUNTER — Encounter: Payer: Self-pay | Admitting: Family Medicine

## 2021-10-16 ENCOUNTER — Ambulatory Visit: Payer: Medicare PPO | Admitting: Family Medicine

## 2021-10-16 DIAGNOSIS — I10 Essential (primary) hypertension: Secondary | ICD-10-CM | POA: Diagnosis not present

## 2021-10-16 NOTE — Patient Instructions (Signed)
It was great seeing you today!  You came in to check on your blood pressure which came down to 154/88.  I recommend going back to taking a full felodipine dose.  As we discussed go to the ED if you experience sudden onset of headaches, vision changes, weakness  Feel free to call with any questions or concerns at any time, at [phone].   Take care,  Dr. Cora Collum [facility]

## 2021-10-16 NOTE — Progress Notes (Unsigned)
    SUBJECTIVE:   CHIEF COMPLAINT / HPI:   Was high at home and then went to  Stoped by fire department yesterday and BP was 190/90. Today was 200 at fire department   Denies chest pain, headaches, change in vision   When first came in to clinic was 193/89 and on repeat was 154/88   Would like referral to nephrologist   PERTINENT  PMH / PSH: ***  OBJECTIVE:   BP (!) 154/88   Pulse 66   Wt 186 lb 9.6 oz (84.6 kg)   SpO2 100%   BMI 27.56 kg/m   ***  ASSESSMENT/PLAN:   No problem-specific Assessment & Plan notes found for this encounter.     Cora Collum, DO [facility]

## 2021-10-22 ENCOUNTER — Ambulatory Visit: Payer: Medicare PPO

## 2021-10-22 VITALS — BP 142/72

## 2021-10-22 DIAGNOSIS — I10 Essential (primary) hypertension: Secondary | ICD-10-CM

## 2021-10-25 NOTE — Progress Notes (Unsigned)
[name] [address] [place] [address] Phone: [phone] Subjective:   INadine [name], am serving as a scribe for Dr. Antoine Primas.  I'm seeing this patient by the request  of:  [name], MD  CC: Back pain and neck pain follow-up  [id number]  Paul [name] is a 87 y.o. male coming in with complaint of back and neck pain. OMT on 09/16/2021. Patient states same per usual. No new complaints.  Has been playing golf fairly regularly.  Patient notices significant tightness of his hip flexors but nothing that he would say is out of the ordinary.  Continues to do multiple stretching techniques and continues to stay active.  Medications patient has been prescribed: None  Taking:         Reviewed prior external information including notes and imaging from previsou exam, outside providers and external EMR if available.   As well as notes that were available from care everywhere and other healthcare systems.  Past medical history, social, surgical and family history all reviewed in electronic medical record.  No pertanent information unless stated regarding to the chief complaint.   Past Medical History:  Diagnosis Date   GERD (gastroesophageal reflux disease)    Hiatal hernia    History of cataract    Hypertension     Allergies  Allergen Reactions   Ace Inhibitors Swelling    After taking for long time had (facial) angioedema on lisinopril     Review of Systems:  No headache, visual changes, nausea, vomiting, diarrhea, constipation, dizziness, abdominal pain, skin rash, fevers, chills, night sweats, weight loss, swollen lymph nodes, b joint swelling, chest pain, shortness of breath, mood changes. POSITIVE muscle aches, body aches  Objective  Blood pressure (!) 142/88, pulse 77, height 5\' 9"  (1.753 m), weight 183 lb (83 kg), SpO2 98 %.   General: No apparent distress alert and oriented x3 mood and affect normal, dressed  appropriately.  HEENT: Pupils equal, extraocular movements intact  Respiratory: Patient's speak in full sentences and does not appear short of breath  Cardiovascular: No lower extremity edema, non tender, no erythema  Gait mildly antalgic MSK:  Back does have loss of lordosis and some degenerative scoliosis.  Tightness of the hip flexors bilaterally.  Neck exam does have tightness as well noted.  Tightness with FABER test bilaterally.  Osteopathic findings   C5 flexed rotated and side bent left T3 extended rotated and side bent right inhaled rib T9 extended rotated and side bent left L3 flexed rotated and side bent right Sacrum right on right       Assessment and Plan:  Spinal stenosis of lumbar region with radiculopathy Patient has done unremarkable over 12 years with patient having severe spinal stenosis.  Patient still has tightness noted in the paraspinal musculature in the lumbar spine.  Patient does have some intermittent radicular symptoms we will continue to monitor.  Patient has been given different medications but likes to avoid them when possible.  We discussed with patient that if worsening symptoms can consider the possibility of repeating the gabapentin as well as the epidurals.  Follow-up with me again in 6 to 8 weeks.    Nonallopathic problems  Decision today to treat with OMT was based on Physical Exam  After verbal consent patient was treated with HVLA, ME, FPR techniques in cervical, rib, thoracic, lumbar, and sacral  areas  Patient tolerated the procedure well with improvement in symptoms  Patient given  exercises, stretches and lifestyle modifications  See medications in patient instructions if given  Patient will follow up in 4-8 weeks     The above documentation has been reviewed and is accurate and complete [name], DO         Note: This dictation was prepared with Dragon dictation along with smaller phrase technology. Any  transcriptional errors that result from this process are unintentional.

## 2021-11-05 ENCOUNTER — Ambulatory Visit: Payer: Medicare PPO | Admitting: Family Medicine

## 2021-11-05 VITALS — BP 142/88 | HR 77 | Ht 69.0 in | Wt 183.0 lb

## 2021-11-05 DIAGNOSIS — M5416 Radiculopathy, lumbar region: Secondary | ICD-10-CM

## 2021-11-05 DIAGNOSIS — M48061 Spinal stenosis, lumbar region without neurogenic claudication: Secondary | ICD-10-CM

## 2021-11-05 DIAGNOSIS — M9908 Segmental and somatic dysfunction of rib cage: Secondary | ICD-10-CM | POA: Diagnosis not present

## 2021-11-05 DIAGNOSIS — M9904 Segmental and somatic dysfunction of sacral region: Secondary | ICD-10-CM

## 2021-11-05 DIAGNOSIS — M9902 Segmental and somatic dysfunction of thoracic region: Secondary | ICD-10-CM

## 2021-11-05 DIAGNOSIS — M9903 Segmental and somatic dysfunction of lumbar region: Secondary | ICD-10-CM

## 2021-11-05 DIAGNOSIS — M9901 Segmental and somatic dysfunction of cervical region: Secondary | ICD-10-CM | POA: Diagnosis not present

## 2021-11-05 NOTE — Patient Instructions (Signed)
Good to see you! Keep gripping and ripping See you again in 6 weeks

## 2021-11-06 NOTE — Assessment & Plan Note (Signed)
Patient has done unremarkable over 12 years with patient having severe spinal stenosis.  Patient still has tightness noted in the paraspinal musculature in the lumbar spine.  Patient does have some intermittent radicular symptoms we will continue to monitor.  Patient has been given different medications but likes to avoid them when possible.  We discussed with patient that if worsening symptoms can consider the possibility of repeating the gabapentin as well as the epidurals.  Follow-up with me again in 6 to 8 weeks.

## 2021-11-29 ENCOUNTER — Other Ambulatory Visit: Payer: Self-pay | Admitting: Internal Medicine

## 2021-12-03 ENCOUNTER — Ambulatory Visit: Payer: Self-pay | Admitting: Licensed Clinical Social Worker

## 2021-12-03 ENCOUNTER — Telehealth: Payer: Self-pay

## 2021-12-03 DIAGNOSIS — I10 Essential (primary) hypertension: Secondary | ICD-10-CM

## 2021-12-03 DIAGNOSIS — N1832 Chronic kidney disease, stage 3b: Secondary | ICD-10-CM

## 2021-12-03 NOTE — Patient Outreach (Signed)
  Care Coordination   Initial Visit Note   12/03/2021 Name: Paul [name] MRN: [id number] DOB: 08-24-1934  Paul [name] is a 87 y.o. year old male who sees [name], MD for primary care. I spoke with  [name] by phone today  What matters to the patients health and wellness today?  Patient concerns with kidney levels and advised he hadn't heard back from careteam regarding treatment. SW sent message to care team with patients request to give patient a call.      SDOH assessments and interventions completed:  Yes     Care Coordination Interventions Activated:  Yes  Care Coordination Interventions:  Yes, provided   Follow up plan: No further intervention required.   Encounter Outcome:  Pt. Visit Completed

## 2021-12-03 NOTE — Telephone Encounter (Signed)
Per most recent PCP office visit note, plan was to refer to nephrology due to worsening CKD, difficult to control HTN, and per patient request. Referral order has been placed.

## 2021-12-03 NOTE — Telephone Encounter (Signed)
I was asked by Marena Chancy to reach out to patient for "kidney concerns."   I called patient to discuss. Patient reports at his last visit he was told he would be referred to Nephrology.   I do see this in 10/16/2021 office visit note. However, I do not see where referral was actually placed.   Will forward to provider who saw patient to place referral.

## 2021-12-03 NOTE — Patient Instructions (Signed)
Visit Information  Instructions:   Patient was given the following information about care management and care coordination services today, agreed to services, and gave verbal consent: 1.care management/care coordination services include personalized support from designated clinical staff supervised by their physician, including individualized plan of care and coordination with other care providers 2. 24/7 contact phone numbers for assistance for urgent and routine care needs. 3. The patient may stop care management/care coordination services at any time by phone call to the office staff.  Patient verbalizes understanding of instructions and care plan provided today and agrees to view in MyChart. Active MyChart status and patient understanding of how to access instructions and care plan via MyChart confirmed with patient.     No further follow up required: .  Melford Tullier, BSW , MSW Social Worker [facility] Care Management  [phone]      

## 2021-12-11 NOTE — Progress Notes (Signed)
[name] [address] [place] [address] Phone: [phone] Subjective:   Paul [name], am serving as a scribe for Dr. Antoine Primas.  I'm seeing this patient by the request  of:  [name], MD  CC: Neck and back pain follow-up  [id number]  Paul [name] is a 87 y.o. male coming in with complaint of back and neck pain. OMT 11/05/2021. Patient states doing well. Here for manipulation. No new issues has had some tightness but nothing significant.  Has noticed more right-sided gluteal pain.  Thinks he may have injured it while playing golf.  No swelling, erythema, bruising noted.  Medications patient has been prescribed: None  Taking:         Reviewed prior external information including notes and imaging from previsou exam, outside providers and external EMR if available.   As well as notes that were available from care everywhere and other healthcare systems.  Past medical history, social, surgical and family history all reviewed in electronic medical record.  No pertanent information unless stated regarding to the chief complaint.   Past Medical History:  Diagnosis Date   GERD (gastroesophageal reflux disease)    Hiatal hernia    History of cataract    Hypertension     Allergies  Allergen Reactions   Ace Inhibitors Swelling    After taking for long time had (facial) angioedema on lisinopril     Review of Systems:  No headache, visual changes, nausea, vomiting, diarrhea, constipation, dizziness, abdominal pain, skin rash, fevers, chills, night sweats, weight loss, swollen lymph nodes, body aches, joint swelling, chest pain, shortness of breath, mood changes. POSITIVE muscle aches  Objective  Blood pressure 130/80, pulse 77, height 5\' 9"  (1.753 m), weight 183 lb (83 kg), SpO2 97 %.   General: No apparent distress alert and oriented x3 mood and affect normal, dressed appropriately.  HEENT: Pupils equal, extraocular  movements intact  Respiratory: Patient's speak in full sentences and does not appear short of breath  Cardiovascular: No lower extremity edema, non tender, no erythema  Gait MSK:  Back low back exam does have some loss of lordosis.  Some limited sidebending bilaterally.  Only 5 degrees of extension noted.  Osteopathic findings  C2 flexed rotated and side bent right C6 flexed rotated and side bent left T3 extended rotated and side bent right inhaled rib T9 extended rotated and side bent left L2 flexed rotated and side bent right Sacrum right on right       Assessment and Plan:  Spinal stenosis of lumbar region with radiculopathy Known spinal stenosis but is doing relatively well.  Has been able to study for away from any type of surgical intervention still at this moment.  Has different medications we have used over the course of time the patient uses as needed.  Increase activity slowly otherwise.  We will follow-up with me again in 6 to 8 weeks.    Nonallopathic problems  Decision today to treat with OMT was based on Physical Exam  After verbal consent patient was treated with HVLA, ME, FPR techniques in cervical, rib, thoracic, lumbar, and sacral  areas  Patient tolerated the procedure well with improvement in symptoms  Patient given exercises, stretches and lifestyle modifications  See medications in patient instructions if given  Patient will follow up in 4-8 weeks    The above documentation has been reviewed and is accurate and complete , DO  Note: This dictation was prepared with Dragon dictation along with smaller phrase technology. Any transcriptional errors that result from this process are unintentional.

## 2021-12-17 ENCOUNTER — Ambulatory Visit: Payer: Medicare PPO | Admitting: Family Medicine

## 2021-12-17 VITALS — BP 130/80 | HR 77 | Ht 69.0 in | Wt 183.0 lb

## 2021-12-17 DIAGNOSIS — M5416 Radiculopathy, lumbar region: Secondary | ICD-10-CM | POA: Diagnosis not present

## 2021-12-17 DIAGNOSIS — M9908 Segmental and somatic dysfunction of rib cage: Secondary | ICD-10-CM | POA: Diagnosis not present

## 2021-12-17 DIAGNOSIS — M48061 Spinal stenosis, lumbar region without neurogenic claudication: Secondary | ICD-10-CM | POA: Diagnosis not present

## 2021-12-17 DIAGNOSIS — M9902 Segmental and somatic dysfunction of thoracic region: Secondary | ICD-10-CM | POA: Diagnosis not present

## 2021-12-17 DIAGNOSIS — M9903 Segmental and somatic dysfunction of lumbar region: Secondary | ICD-10-CM | POA: Diagnosis not present

## 2021-12-17 DIAGNOSIS — M9901 Segmental and somatic dysfunction of cervical region: Secondary | ICD-10-CM

## 2021-12-17 DIAGNOSIS — M9904 Segmental and somatic dysfunction of sacral region: Secondary | ICD-10-CM | POA: Diagnosis not present

## 2021-12-17 NOTE — Assessment & Plan Note (Signed)
Known spinal stenosis but is doing relatively well.  Has been able to study for away from any type of surgical intervention still at this moment.  Has different medications we have used over the course of time the patient uses as needed.  Increase activity slowly otherwise.  We will follow-up with me again in 6 to 8 weeks.

## 2021-12-17 NOTE — Patient Instructions (Signed)
Do prescribed exercises at least 3x a week  

## 2021-12-23 ENCOUNTER — Encounter: Payer: Self-pay | Admitting: Family Medicine

## 2021-12-23 ENCOUNTER — Ambulatory Visit: Payer: Medicare PPO | Admitting: Family Medicine

## 2021-12-23 VITALS — BP 154/75 | Ht 69.0 in | Wt 183.0 lb

## 2021-12-23 DIAGNOSIS — M533 Sacrococcygeal disorders, not elsewhere classified: Secondary | ICD-10-CM | POA: Diagnosis not present

## 2021-12-23 NOTE — Assessment & Plan Note (Signed)
Acutely occurring.  He has had similar pain in the past.  He has been playing a round of golf this past weekend.  Pain is felt more in the gluteus. -Counseled on home exercise therapy and supportive care. -Counseled on compression. -IM Depo-Medrol. -Could consider shockwave therapy or physical therapy.

## 2021-12-23 NOTE — Progress Notes (Signed)
  Paul [name] - 87 y.o. male MRN [id number]  Date of birth: 01/28/1935  SUBJECTIVE:  Including CC & ROS.  No chief complaint on file.   Paul [name] is a 87 y.o. male that is presenting with acute right gluteus pain.  The pain is occurring in the right gluteus.  Has been worse with walking.  He does play golf on a regular basis.  Review of the office note from 8/22 shows he had a manipulation performed. Independent review of the lumbar spine x-ray from 2022 shows severe degenerative changes of the lower lumbar spine with multiple facet arthropathy. Independent review of the right hip x-ray from 2020 shows no acute changes.  Review of Systems See HPI   HISTORY: Past Medical, Surgical, Social, and Family History Reviewed & Updated per EMR.   Pertinent Historical Findings include:  Past Medical History:  Diagnosis Date   GERD (gastroesophageal reflux disease)    Hiatal hernia    History of cataract    Hypertension     Past Surgical History:  Procedure Laterality Date   CATARACT EXTRACTION, BILATERAL     TOTAL KNEE ARTHROPLASTY     left   TRANSURETHRAL RESECTION OF PROSTATE  2011     PHYSICAL EXAM:  VS: BP (!) 154/75 (BP Location: Left Arm, Patient Position: Sitting)   Ht 5\' 9"  (1.753 m)   Wt 183 lb (83 kg)   BMI 27.02 kg/m  Physical Exam Gen: NAD, alert, cooperative with exam, well-appearing MSK:  Neurovascularly intact     ASSESSMENT & PLAN:   Sacroiliac joint dysfunction of right side Acutely occurring.  He has had similar pain in the past.  He has been playing a round of golf this past weekend.  Pain is felt more in the gluteus. -Counseled on home exercise therapy and supportive care. -Counseled on compression. -IM Depo-Medrol. -Could consider shockwave therapy or physical therapy.

## 2021-12-23 NOTE — Patient Instructions (Signed)
Nice to meet you Please try heat  Please consider compression  Please try the exercises   Please send me a message in MyChart with any questions or updates.  Please see Dr. Jennette Kettle in 2-3 weeks if no better.   --Dr. Jordan Likes

## 2022-01-17 ENCOUNTER — Ambulatory Visit: Payer: Medicare PPO | Admitting: Student

## 2022-01-17 ENCOUNTER — Encounter: Payer: Self-pay | Admitting: Student

## 2022-01-17 VITALS — BP 124/74 | HR 65 | Wt 182.0 lb

## 2022-01-17 DIAGNOSIS — R1032 Left lower quadrant pain: Secondary | ICD-10-CM | POA: Insufficient documentation

## 2022-01-17 DIAGNOSIS — Z23 Encounter for immunization: Secondary | ICD-10-CM | POA: Diagnosis not present

## 2022-01-17 DIAGNOSIS — Z0000 Encounter for general adult medical examination without abnormal findings: Secondary | ICD-10-CM | POA: Diagnosis not present

## 2022-01-17 NOTE — Assessment & Plan Note (Signed)
Now resolved. Benign exam today. Suspect he is right about this being related to gas given resolution. No blood in stool or persistent pain/fever to suspect diverticular source. Not particularly high risk for bowel ischemia or similar process. Okay to resume full activity.

## 2022-01-17 NOTE — Progress Notes (Signed)
    SUBJECTIVE:   CHIEF COMPLAINT / HPI:   Transient Abdominal Pain On Sunday was doing some deep breathing exercises as part of his tai chi and yoga practice and noticed a sharp pain in his lower left abdomen.  He suspects that the time that this was due to gas pains but went ahead and made an appointment to be evaluated.  He did note that he passed a fair amount of gas after this episode with resolution of his symptoms but elected to keep his appointment for thorough evaluation.  He denies any changes to his stools or urine.  He is otherwise feeling like himself, played golf several times this week without issue or recurrence and hip symptoms.   OBJECTIVE:   BP 124/74   Pulse 65   Wt 182 lb (82.6 kg)   SpO2 99%   BMI 26.88 kg/m   Physical Exam Constitutional:      General: He is not in acute distress.    Comments: Well-groomed, well-appearing  Cardiovascular:     Rate and Rhythm: Normal rate and regular rhythm.     Heart sounds: Normal heart sounds. No murmur heard. Pulmonary:     Effort: Pulmonary effort is normal.     Breath sounds: No wheezing, rhonchi or rales.  Abdominal:     General: Abdomen is flat. Bowel sounds are normal. There is no distension.     Palpations: Abdomen is soft. There is no mass.     Tenderness: There is no abdominal tenderness. There is no right CVA tenderness or left CVA tenderness.      ASSESSMENT/PLAN:   Left lower quadrant abdominal pain Now resolved. Benign exam today. Suspect he is right about this being related to gas given resolution. No blood in stool or persistent pain/fever to suspect diverticular source. Not particularly high risk for bowel ischemia or similar process. Okay to resume full activity.   Healthcare maintenance Flu shot today     Pearla Dubonnet, [place]

## 2022-01-17 NOTE — Patient Instructions (Signed)
Mr. Blough,  It is such a pleasure to meet you today! I'm glad you are feeling better. While I cannot say for certain what caused that pain you were having, I am pretty confident that it is nothing scary or dangerous. I suspect you may be right about it being gas pain. I'm glad we can get you your flu shot today and give you a clean bill of health to get back out on the golf course!  Pearla Dubonnet, MD

## 2022-01-17 NOTE — Assessment & Plan Note (Signed)
Flu shot today 

## 2022-01-23 DIAGNOSIS — H35721 Serous detachment of retinal pigment epithelium, right eye: Secondary | ICD-10-CM | POA: Diagnosis not present

## 2022-01-23 DIAGNOSIS — Z961 Presence of intraocular lens: Secondary | ICD-10-CM | POA: Diagnosis not present

## 2022-01-23 DIAGNOSIS — H35363 Drusen (degenerative) of macula, bilateral: Secondary | ICD-10-CM | POA: Diagnosis not present

## 2022-01-23 DIAGNOSIS — H353131 Nonexudative age-related macular degeneration, bilateral, early dry stage: Secondary | ICD-10-CM | POA: Diagnosis not present

## 2022-01-23 DIAGNOSIS — H35033 Hypertensive retinopathy, bilateral: Secondary | ICD-10-CM | POA: Diagnosis not present

## 2022-01-23 DIAGNOSIS — H35453 Secondary pigmentary degeneration, bilateral: Secondary | ICD-10-CM | POA: Diagnosis not present

## 2022-01-23 NOTE — Progress Notes (Signed)
[name] [address] [place] [address] Phone: [phone] Subjective:   [name], am serving as a scribe for Dr. Antoine Primas.  I'm seeing this patient by the request  of:  [name], MD  CC: Neck and back pain follow-up  [id number]  Paul [name] is a 87 y.o. male coming in with complaint of back and neck pain. OMT 12/17/2021. Saw another provider for SI jt pain on 12/23/2021. Patient states that he had injection which helped. No issues since.   He does state that he would like to see if there is a possibility for referral to another urologist.  Did not feel like he got quite the care he should potentially have.   Medications patient has been prescribed: None  Taking:         Reviewed prior external information including notes and imaging from previsou exam, outside providers and external EMR if available.   As well as notes that were available from care everywhere and other healthcare systems.  Past medical history, social, surgical and family history all reviewed in electronic medical record.  No pertanent information unless stated regarding to the chief complaint.   Past Medical History:  Diagnosis Date   GERD (gastroesophageal reflux disease)    Hiatal hernia    History of cataract    Hypertension     Allergies  Allergen Reactions   Ace Inhibitors Swelling    After taking for long time had (facial) angioedema on lisinopril     Review of Systems:  No headache, visual changes, nausea, vomiting, diarrhea, constipation, dizziness, abdominal pain, skin rash, fevers, chills, night sweats, weight loss, swollen lymph nodes, body aches, joint swelling, chest pain, shortness of breath, mood changes. POSITIVE muscle aches  Objective  Blood pressure 132/82, pulse 65, height 5\' 9"  (1.753 m), weight 185 lb (83.9 kg), SpO2 99 %.   General: No apparent distress alert and oriented x3 mood and affect normal, dressed  appropriately.  HEENT: Pupils equal, extraocular movements intact  Respiratory: Patient's speak in full sentences and does not appear short of breath  Cardiovascular: No lower extremity edema, non tender, no erythema  Neck exam does have some loss of lordosis.  Back exam does have significant tightness with FABER test bilaterally right greater than left.  Patient does have difficulty with leg test.  Osteopathic findings  C2 flexed rotated and side bent right C6 flexed rotated and side bent left T3 extended rotated and side bent right inhaled rib T9 extended rotated and side bent left L2 flexed rotated and side bent right L5 flexed rotated and side bent left Sacrum right on right    Assessment and Plan:  Spinal stenosis of lumbar region with radiculopathy Mild increase in tightness noted again.  Does have the pain in the sacroiliac joints.  Discussed with patient about icing regimen and home exercises.  Still responding well to osteopathic manipulation.  Patient is avoiding significant number of medications still.  Follow-up again in 6 to 8 weeks.  BPH associated with nocturia Patient wanted referral to a new neurologist.  Patient's primary care will be made aware.  Also has had some chronic kidney disease will refer to nephrology to establish care but likely would not change management at this time    Nonallopathic problems  Decision today to treat with OMT was based on Physical Exam  After verbal consent patient was treated with HVLA, ME, FPR techniques in cervical, rib, thoracic, lumbar, and  sacral  areas  Patient tolerated the procedure well with improvement in symptoms  Patient given exercises, stretches and lifestyle modifications  See medications in patient instructions if given  Patient will follow up in 4-8 weeks     The above documentation has been reviewed and is accurate and complete Lyndal Pulley, DO         Note: This dictation was prepared with Dragon  dictation along with smaller phrase technology. Any transcriptional errors that result from this process are unintentional.

## 2022-01-28 ENCOUNTER — Ambulatory Visit: Payer: Medicare PPO | Admitting: Family Medicine

## 2022-01-28 VITALS — BP 132/82 | HR 65 | Ht 69.0 in | Wt 185.0 lb

## 2022-01-28 DIAGNOSIS — M9901 Segmental and somatic dysfunction of cervical region: Secondary | ICD-10-CM | POA: Diagnosis not present

## 2022-01-28 DIAGNOSIS — N400 Enlarged prostate without lower urinary tract symptoms: Secondary | ICD-10-CM

## 2022-01-28 DIAGNOSIS — N401 Enlarged prostate with lower urinary tract symptoms: Secondary | ICD-10-CM | POA: Diagnosis not present

## 2022-01-28 DIAGNOSIS — N189 Chronic kidney disease, unspecified: Secondary | ICD-10-CM | POA: Diagnosis not present

## 2022-01-28 DIAGNOSIS — R351 Nocturia: Secondary | ICD-10-CM

## 2022-01-28 DIAGNOSIS — M48061 Spinal stenosis, lumbar region without neurogenic claudication: Secondary | ICD-10-CM | POA: Diagnosis not present

## 2022-01-28 DIAGNOSIS — M5416 Radiculopathy, lumbar region: Secondary | ICD-10-CM

## 2022-01-28 DIAGNOSIS — M9902 Segmental and somatic dysfunction of thoracic region: Secondary | ICD-10-CM | POA: Diagnosis not present

## 2022-01-28 DIAGNOSIS — M9904 Segmental and somatic dysfunction of sacral region: Secondary | ICD-10-CM | POA: Diagnosis not present

## 2022-01-28 DIAGNOSIS — M9908 Segmental and somatic dysfunction of rib cage: Secondary | ICD-10-CM

## 2022-01-28 DIAGNOSIS — M9903 Segmental and somatic dysfunction of lumbar region: Secondary | ICD-10-CM

## 2022-01-28 NOTE — Assessment & Plan Note (Signed)
Mild increase in tightness noted again.  Does have the pain in the sacroiliac joints.  Discussed with patient about icing regimen and home exercises.  Still responding well to osteopathic manipulation.  Patient is avoiding significant number of medications still.  Follow-up again in 6 to 8 weeks.

## 2022-01-28 NOTE — Assessment & Plan Note (Signed)
Patient wanted referral to a new neurologist.  Patient's primary care will be made aware.  Also has had some chronic kidney disease will refer to nephrology to establish care but likely would not change management at this time

## 2022-02-03 ENCOUNTER — Telehealth: Payer: Self-pay | Admitting: Family Medicine

## 2022-02-03 NOTE — Telephone Encounter (Signed)
Called patient who wanted to know name of urologist on referral. Provided patient with name.

## 2022-02-03 NOTE — Telephone Encounter (Signed)
Patient called and left a message asking if Dr Thompson Caul assistant could call him to discuss some questions.

## 2022-02-25 ENCOUNTER — Other Ambulatory Visit: Payer: Self-pay

## 2022-02-26 MED ORDER — FELODIPINE ER 10 MG PO TB24: ORAL_TABLET | ORAL | 3 refills | Status: DC

## 2022-02-27 ENCOUNTER — Other Ambulatory Visit: Payer: Self-pay | Admitting: Internal Medicine

## 2022-03-14 NOTE — Progress Notes (Unsigned)
[name] [address] [place] [address] Phone: [phone] Subjective:   Paul [name], am serving as a scribe for Dr. Antoine Primas.  I'm seeing this patient by the request  of:  [name], MD  CC: Back and neck pain follow-up  [id number]  Paul [name] is a 87 y.o. male coming in with complaint of back and neck pain. OMT 01/28/2022. Patient states overall does have some tightness noted still.  Discussed icing regimen and home exercises.  Has had some tightness noted recently.  No longer taking the gabapentin regularly but is wondering if necessary.  Medications patient has been prescribed: None          Reviewed prior external information including notes and imaging from previsou exam, outside providers and external EMR if available.   As well as notes that were available from care everywhere and other healthcare systems.  Past medical history, social, surgical and family history all reviewed in electronic medical record.  No pertanent information unless stated regarding to the chief complaint.   Past Medical History:  Diagnosis Date   GERD (gastroesophageal reflux disease)    Hiatal hernia    History of cataract    Hypertension     Allergies  Allergen Reactions   Ace Inhibitors Swelling    After taking for long time had (facial) angioedema on lisinopril     Review of Systems:  No headache, visual changes, nausea, vomiting, diarrhea, constipation, dizziness, abdominal pain, skin rash, fevers, chills, night sweats, weight loss, swollen lymph nodes, body aches, joint swelling, chest pain, shortness of breath, mood changes. POSITIVE muscle aches  Objective  Blood pressure 116/76, pulse (!) 58, height 5\' 9"  (1.753 m), weight 186 lb (84.4 kg), SpO2 98 %.   General: No apparent distress alert and oriented x3 mood and affect normal, dressed appropriately.  HEENT: Pupils equal, extraocular movements intact  Respiratory:  Patient's speak in full sentences and does not appear short of breath  Cardiovascular: No lower extremity edema, non tender, no erythema  Low back does have increasing in tightness noted at the moment.  Difficulty with FABER test.  Does have tenderness to palpation in the paraspinal musculature.  Neck exam does have more tenderness actually on the left side of the neck.  Seems to have tightness with sidebending to the right  Osteopathic findings  C2 flexed rotated and side bent left C6 flexed rotated and side bent left T9 extended rotated and side bent left inhaled rib L2 flexed rotated and side bent right Sacrum right on right    Assessment and Plan:  Strain of lumbar paraspinal muscle, initial encounter More tightness noted in the lower back again.  We discussed at this time.  Could be difficult.  Discussed continuing to work on core strengthening and avoid excessive extension.  Patient is happy as long as he is able to play golf on a regular basis.  Does not want to change any medications.  Does respond well to osteopathic manipulation when necessary.  Follow-up again in 6 to 8 weeks    Nonallopathic problems  Decision today to treat with OMT was based on Physical Exam  After verbal consent patient was treated with HVLA, ME, FPR techniques in cervical, rib, thoracic, lumbar, and sacral  areas  Patient tolerated the procedure well with improvement in symptoms  Patient given exercises, stretches and lifestyle modifications  See medications in patient instructions if given  Patient will follow up in 4-8  weeks     The above documentation has been reviewed and is accurate and complete Lyndal Pulley, DO         Note: This dictation was prepared with Dragon dictation along with smaller phrase technology. Any transcriptional errors that result from this process are unintentional.

## 2022-03-18 ENCOUNTER — Ambulatory Visit: Payer: Medicare PPO | Admitting: Family Medicine

## 2022-03-18 ENCOUNTER — Encounter: Payer: Self-pay | Admitting: Family Medicine

## 2022-03-18 VITALS — BP 116/76 | HR 58 | Ht 69.0 in | Wt 186.0 lb

## 2022-03-18 DIAGNOSIS — M9902 Segmental and somatic dysfunction of thoracic region: Secondary | ICD-10-CM | POA: Diagnosis not present

## 2022-03-18 DIAGNOSIS — S39012A Strain of muscle, fascia and tendon of lower back, initial encounter: Secondary | ICD-10-CM

## 2022-03-18 DIAGNOSIS — M9908 Segmental and somatic dysfunction of rib cage: Secondary | ICD-10-CM

## 2022-03-18 DIAGNOSIS — M9901 Segmental and somatic dysfunction of cervical region: Secondary | ICD-10-CM

## 2022-03-18 DIAGNOSIS — M9903 Segmental and somatic dysfunction of lumbar region: Secondary | ICD-10-CM | POA: Diagnosis not present

## 2022-03-18 DIAGNOSIS — M9904 Segmental and somatic dysfunction of sacral region: Secondary | ICD-10-CM

## 2022-03-18 NOTE — Patient Instructions (Signed)
Great to see you Remember the chicken wing stretch See me in 5-6 weeks

## 2022-03-18 NOTE — Assessment & Plan Note (Signed)
More tightness noted in the lower back again.  We discussed at this time.  Could be difficult.  Discussed continuing to work on core strengthening and avoid excessive extension.  Patient is happy as long as he is able to play golf on a regular basis.  Does not want to change any medications.  Does respond well to osteopathic manipulation when necessary.  Follow-up again in 6 to 8 weeks

## 2022-04-02 DIAGNOSIS — H35033 Hypertensive retinopathy, bilateral: Secondary | ICD-10-CM | POA: Diagnosis not present

## 2022-04-02 DIAGNOSIS — H04123 Dry eye syndrome of bilateral lacrimal glands: Secondary | ICD-10-CM | POA: Diagnosis not present

## 2022-04-02 DIAGNOSIS — H353131 Nonexudative age-related macular degeneration, bilateral, early dry stage: Secondary | ICD-10-CM | POA: Diagnosis not present

## 2022-04-09 DIAGNOSIS — R102 Pelvic and perineal pain: Secondary | ICD-10-CM | POA: Diagnosis not present

## 2022-04-09 DIAGNOSIS — R3915 Urgency of urination: Secondary | ICD-10-CM | POA: Diagnosis not present

## 2022-04-09 DIAGNOSIS — N5201 Erectile dysfunction due to arterial insufficiency: Secondary | ICD-10-CM | POA: Diagnosis not present

## 2022-04-09 DIAGNOSIS — R351 Nocturia: Secondary | ICD-10-CM | POA: Diagnosis not present

## 2022-04-09 DIAGNOSIS — N401 Enlarged prostate with lower urinary tract symptoms: Secondary | ICD-10-CM | POA: Diagnosis not present

## 2022-04-14 ENCOUNTER — Other Ambulatory Visit: Payer: Self-pay | Admitting: Family Medicine

## 2022-04-17 ENCOUNTER — Telehealth: Payer: Self-pay | Admitting: Family Medicine

## 2022-04-17 NOTE — Telephone Encounter (Signed)
Left message for patient to call back and schedule Medicare Annual Wellness Visit (AWV) either virtually or phone . Left  my Zachery Conch number [phone]   Last AWV  12/13/20 please schedule with Nurse Health Adviser   45 min for awv-i and in office appointments 30 min for awv-s  phone/virtual appointments

## 2022-04-23 NOTE — Progress Notes (Signed)
[name] [address] [place] [address] Phone: [phone] Subjective:   Paul [name], am serving as a scribe for Dr. Antoine Primas.  I'm seeing this patient by the request  of:  [name], MD  CC: Neck and back pain follow-up  [id number]  Rob Mciver is a 87 y.o. male coming in with complaint of back and neck pain. OMT 03/18/2022. Patient states that he is having cramping in R calf which he attributes to sciatic nerve.   L shoulder is painful when he sleeps on L side. Has full range with little pain.   Medications patient has been prescribed: None  Taking:         Reviewed prior external information including notes and imaging from previsou exam, outside providers and external EMR if available.   As well as notes that were available from care everywhere and other healthcare systems.  Past medical history, social, surgical and family history all reviewed in electronic medical record.  No pertanent information unless stated regarding to the chief complaint.   Past Medical History:  Diagnosis Date   GERD (gastroesophageal reflux disease)    Hiatal hernia    History of cataract    Hypertension     Allergies  Allergen Reactions   Ace Inhibitors Swelling    After taking for long time had (facial) angioedema on lisinopril     Review of Systems:  No headache, visual changes, nausea, vomiting, diarrhea, constipation, dizziness, abdominal pain, skin rash, fevers, chills, night sweats, weight loss, swollen lymph nodes, body aches, joint swelling, chest pain, shortness of breath, mood changes. POSITIVE muscle aches  Objective  Blood pressure (!) 140/82, pulse 71, height 5\' 9"  (1.753 m), weight 186 lb (84.4 kg), SpO2 98 %.   General: No apparent distress alert and oriented x3 mood and affect normal, dressed appropriately.  HEENT: Pupils equal, extraocular movements intact  Respiratory: Patient's speak in full sentences and  does not appear short of breath  Cardiovascular: No lower extremity edema, non tender, no erythema  Low back does have some loss lordosis.  Some tenderness to palpation in the paraspinal musculature.  Left shoulder exam does have some positive impingement noted.  Some tenderness to palpation of the acromioclavicular joint noted.  Osteopathic findings  C2 flexed rotated and side bent right C7 flexed rotated and side bent left T3 extended rotated and side bent right inhaled rib T8 extended rotated and side bent left L2 flexed rotated and side bent right Sacrum right on right       Assessment and Plan:  Rotator cuff tendinitis, left Previous rotator cuff tendinitis.  Discussed icing regimen and home exercises, discussed which activities to do and which ones to avoid.  Increase activity slowly.  Follow-up again in 6 to 8 weeks worsening pain can consider an injection.  Spinal stenosis of lumbar region with radiculopathy Patient is having more discomfort in the calf muscle themselves bilaterally which is usually the first signs of this.  Chronic problem with worsening symptoms.  Discussed the potential for injections.  Patient elected to try the osteopathic manipulation and see how he responds.  Follow-up again in 6 to 8 weeks continue the medications about when needed including the gabapentin, anti-inflammatories, and muscle relaxers.    Nonallopathic problems  Decision today to treat with OMT was based on Physical Exam  After verbal consent patient was treated with HVLA, ME, FPR techniques in cervical, rib, thoracic, lumbar, and sacral  areas  Patient tolerated the procedure well with improvement in symptoms  Patient given exercises, stretches and lifestyle modifications  See medications in patient instructions if given  Patient will follow up in 4-8 weeks     The above documentation has been reviewed and is accurate and complete [name], DO         Note: This  dictation was prepared with Dragon dictation along with smaller phrase technology. Any transcriptional errors that result from this process are unintentional.

## 2022-04-29 ENCOUNTER — Ambulatory Visit: Payer: Medicare PPO | Admitting: Family Medicine

## 2022-04-29 VITALS — BP 140/82 | HR 71 | Ht 69.0 in | Wt 186.0 lb

## 2022-04-29 DIAGNOSIS — M9904 Segmental and somatic dysfunction of sacral region: Secondary | ICD-10-CM

## 2022-04-29 DIAGNOSIS — M5416 Radiculopathy, lumbar region: Secondary | ICD-10-CM

## 2022-04-29 DIAGNOSIS — M9902 Segmental and somatic dysfunction of thoracic region: Secondary | ICD-10-CM | POA: Diagnosis not present

## 2022-04-29 DIAGNOSIS — M7582 Other shoulder lesions, left shoulder: Secondary | ICD-10-CM | POA: Diagnosis not present

## 2022-04-29 DIAGNOSIS — M9908 Segmental and somatic dysfunction of rib cage: Secondary | ICD-10-CM

## 2022-04-29 DIAGNOSIS — M9903 Segmental and somatic dysfunction of lumbar region: Secondary | ICD-10-CM | POA: Diagnosis not present

## 2022-04-29 DIAGNOSIS — M48061 Spinal stenosis, lumbar region without neurogenic claudication: Secondary | ICD-10-CM | POA: Diagnosis not present

## 2022-04-29 DIAGNOSIS — M9901 Segmental and somatic dysfunction of cervical region: Secondary | ICD-10-CM | POA: Diagnosis not present

## 2022-04-29 NOTE — Patient Instructions (Signed)
Thanks for the tip on the speeding tickets ;) Keep hands within peripheral vision  If shoulder hurts more call me Otherwise see me again in 5 weeks  Happy New Year!

## 2022-04-29 NOTE — Assessment & Plan Note (Signed)
Previous rotator cuff tendinitis.  Discussed icing regimen and home exercises, discussed which activities to do and which ones to avoid.  Increase activity slowly.  Follow-up again in 6 to 8 weeks worsening pain can consider an injection.

## 2022-04-29 NOTE — Assessment & Plan Note (Signed)
Patient is having more discomfort in the calf muscle themselves bilaterally which is usually the first signs of this.  Chronic problem with worsening symptoms.  Discussed the potential for injections.  Patient elected to try the osteopathic manipulation and see how he responds.  Follow-up again in 6 to 8 weeks continue the medications about when needed including the gabapentin, anti-inflammatories, and muscle relaxers.

## 2022-05-28 ENCOUNTER — Other Ambulatory Visit: Payer: Self-pay | Admitting: Internal Medicine

## 2022-05-29 DIAGNOSIS — N400 Enlarged prostate without lower urinary tract symptoms: Secondary | ICD-10-CM | POA: Diagnosis not present

## 2022-05-29 DIAGNOSIS — N138 Other obstructive and reflux uropathy: Secondary | ICD-10-CM | POA: Diagnosis not present

## 2022-05-29 DIAGNOSIS — N401 Enlarged prostate with lower urinary tract symptoms: Secondary | ICD-10-CM | POA: Diagnosis not present

## 2022-05-29 NOTE — Progress Notes (Signed)
[place] Phone: [phone] Subjective:   Paul [name], am serving as a scribe for Dr. Hulan [name].  I'm seeing this patient by the request  of:  Paul La, MD  CC: Neck and back pain follow-up  MCN:[id number]  Paul [name] is a 88 y.o. male coming in with complaint of back and neck pain. OTM 05/07/2022. Patient states that he has had intermittent nerve pain in R leg since last visit.   Would like L shoulder injection. Pain seems to be worsening. Wakes up from pain.   Medications patient has been prescribed: None  Taking:         Reviewed prior external information including notes and imaging from previsou exam, outside providers and external EMR if available.   As well as notes that were available from care everywhere and other healthcare systems.  Past medical history, social, surgical and family history all reviewed in electronic medical record.  [name] pertanent information unless stated regarding to the chief complaint.   Past Medical History:  Diagnosis Date   GERD (gastroesophageal reflux disease)    Hiatal hernia    History of cataract    Hypertension     Allergies  Allergen Reactions   Ace Inhibitors Swelling    After taking for long time had (facial) angioedema on lisinopril     Review of Systems:  [name] headache, visual changes, nausea, vomiting, diarrhea, constipation, dizziness, abdominal pain, skin rash, fevers, chills, night sweats, weight loss, swollen lymph nodes, body aches, joint swelling, chest pain, shortness of breath, mood changes. POSITIVE muscle aches  Objective  Blood pressure 118/78, pulse (!) 55, height 5\' 9"  (1.753 m), weight 185 lb (83.9 kg), SpO2 100 %.   General: [name] apparent distress alert and oriented x3 mood and affect normal, dressed appropriately.  HEENT: Pupils equal, extraocular movements intact  Respiratory: Patient's speak in full sentences and does not  appear short of breath  Cardiovascular: [name] lower extremity edema, non tender, [name] erythema  Left shoulder exam does have some tenderness to palpation noted in the paraspinal musculature in the scapular area.  Patient though does have some positive impingement noted.  Limited external range of motion of the shoulder noted.  Procedure: Real-time Ultrasound Guided Injection of left glenohumeral joint Device: GE Logiq E  Ultrasound guided injection is preferred based studies that show increased duration, increased effect, greater accuracy, decreased procedural pain, increased response rate with ultrasound guided versus blind injection.  Verbal informed consent obtained.  Time-out conducted.  Noted [name] overlying erythema, induration, or other signs of local infection.  Skin prepped in a sterile fashion.  Local anesthesia: Topical Ethyl chloride.  With sterile technique and under real time ultrasound guidance:  Joint visualized.  21g 2 inch needle inserted posterior approach. Pictures taken for needle placement. Patient did have injection of 2 cc of 0.5% Marcaine, and 1cc of Kenalog 40 mg/dL. Completed without difficulty  Pain immediately resolved suggesting accurate placement of the medication.  Advised to call if fevers/chills, erythema, induration, drainage, or persistent bleeding.  Impression: Technically successful ultrasound guided injection.  Osteopathic findings  C2 flexed rotated and side bent right C6 flexed rotated and side bent left T3 extended rotated and side bent right inhaled rib T9 extended rotated and side bent left L2 flexed rotated and side bent right Sacrum right on right       Assessment and Plan:  Rotator cuff tendinitis, left Patient does have  some arthritic changes noted.  Discussed with patient about icing regimen and home exercises, discussed which activities to do and which ones to avoid, increase activity slowly over the course the next several weeks.  Discussed  icing regimen.  Follow-up with me again in 6 to 8 weeks otherwise.    Nonallopathic problems  Decision today to treat with OMT was based on Physical Exam  After verbal consent patient was treated with HVLA, ME, FPR techniques in cervical, rib, thoracic, lumbar, and sacral  areas  Patient tolerated the procedure well with improvement in symptoms  Patient given exercises, stretches and lifestyle modifications  See medications in patient instructions if given  Patient will follow up in 4-8 weeks     The above documentation has been reviewed and is accurate and complete Paul Pulley, DO         Note: This dictation was prepared with Dragon dictation along with smaller phrase technology. Any transcriptional errors that result from this process are unintentional.

## 2022-06-03 ENCOUNTER — Ambulatory Visit: Payer: Self-pay

## 2022-06-03 ENCOUNTER — Ambulatory Visit: Payer: Medicare PPO | Admitting: Family Medicine

## 2022-06-03 ENCOUNTER — Encounter: Payer: Self-pay | Admitting: Family Medicine

## 2022-06-03 VITALS — BP 118/78 | HR 55 | Ht 69.0 in | Wt 185.0 lb

## 2022-06-03 DIAGNOSIS — M9903 Segmental and somatic dysfunction of lumbar region: Secondary | ICD-10-CM

## 2022-06-03 DIAGNOSIS — B369 Superficial mycosis, unspecified: Secondary | ICD-10-CM | POA: Insufficient documentation

## 2022-06-03 DIAGNOSIS — M9902 Segmental and somatic dysfunction of thoracic region: Secondary | ICD-10-CM | POA: Diagnosis not present

## 2022-06-03 DIAGNOSIS — G8929 Other chronic pain: Secondary | ICD-10-CM

## 2022-06-03 DIAGNOSIS — M9901 Segmental and somatic dysfunction of cervical region: Secondary | ICD-10-CM

## 2022-06-03 DIAGNOSIS — M9904 Segmental and somatic dysfunction of sacral region: Secondary | ICD-10-CM | POA: Diagnosis not present

## 2022-06-03 DIAGNOSIS — M25512 Pain in left shoulder: Secondary | ICD-10-CM

## 2022-06-03 DIAGNOSIS — M48061 Spinal stenosis, lumbar region without neurogenic claudication: Secondary | ICD-10-CM

## 2022-06-03 DIAGNOSIS — M7582 Other shoulder lesions, left shoulder: Secondary | ICD-10-CM

## 2022-06-03 DIAGNOSIS — M9908 Segmental and somatic dysfunction of rib cage: Secondary | ICD-10-CM | POA: Diagnosis not present

## 2022-06-03 DIAGNOSIS — M5416 Radiculopathy, lumbar region: Secondary | ICD-10-CM

## 2022-06-03 MED ORDER — FLUCONAZOLE 200 MG PO TABS: ORAL_TABLET | ORAL | 0 refills | Status: DC

## 2022-06-03 NOTE — Assessment & Plan Note (Signed)
Patient does have some arthritic changes noted.  Discussed with patient about icing regimen and home exercises, discussed which activities to do and which ones to avoid, increase activity slowly over the course the next several weeks.  Discussed icing regimen.  Follow-up with me again in 6 to 8 weeks otherwise.

## 2022-06-03 NOTE — Patient Instructions (Addendum)
Injected shoulder today Diflucan take one today and then another in 4 days 1/8 inch heel lift in golf shoes See me in 6 weeks

## 2022-06-03 NOTE — Assessment & Plan Note (Signed)
Patient has a infection that does look like questionable fungal infection.  Given Diflucan to see if this will be helpful.  If does not seem to resolve would send to dermatology.

## 2022-06-03 NOTE — Assessment & Plan Note (Signed)
Tightness noted but nothing significant at the moment.  Discussed icing regimen and home exercises, which activities to do and which ones to avoid.  Increase activity slowly.  Follow-up with me again in 6 to 8 weeks

## 2022-06-06 ENCOUNTER — Other Ambulatory Visit: Payer: Self-pay | Admitting: Family Medicine

## 2022-06-25 ENCOUNTER — Ambulatory Visit: Payer: Medicare PPO | Admitting: Internal Medicine

## 2022-06-25 ENCOUNTER — Encounter: Payer: Self-pay | Admitting: Internal Medicine

## 2022-06-25 VITALS — BP 138/66 | HR 74 | Ht 69.0 in | Wt 184.0 lb

## 2022-06-25 DIAGNOSIS — K6289 Other specified diseases of anus and rectum: Secondary | ICD-10-CM

## 2022-06-25 DIAGNOSIS — K5909 Other constipation: Secondary | ICD-10-CM | POA: Diagnosis not present

## 2022-06-25 DIAGNOSIS — K602 Anal fissure, unspecified: Secondary | ICD-10-CM

## 2022-06-25 MED ORDER — DILTIAZEM GEL 2 %: 1.0000 | Freq: Two times a day (BID) | CUTANEOUS | 0 refills | Status: DC

## 2022-06-25 NOTE — Progress Notes (Signed)
   Subjective:    Patient ID: Paul [name], male    DOB: December 18, 1934, 88 y.o.   MRN: [id number]  HPI Paul [name] is an 88 year old male with a history of GERD with reflux esophagitis, large hiatal hernia, chronic constipation who is here for follow-up to evaluate anorectal pain.  He is here alone today.  He was last seen in May 2022.  For his reflux he has been maintained on pantoprazole 40 mg twice daily.  No significant reflux symptoms today.  He has noticed about 3 months of dull aching pain at the anus and rectum.  Seems to be worse at times with sitting.  Initially was concerned that this may have been prostate and prostatitis.  He has been seen by 2 urologists and was told his prostate was not causing his symptoms.  He has had issues with constipation and he notices that if he misses MiraLAX stools can be hard and more difficult to pass.  He has resumed daily regular MiraLAX and his bowel habits have been more regular.  He has seen some orange-reddish stools but he attributed this to his oral vitamin A therapy.  He has not seen any visible blood.  No abdominal pain.  He has intermittently dealt with hemorrhoids.  He had prior Cologuard and more remote colonoscopies.  He he specifically recalls no prior colonic polyps.  His last Cologuard was negative in 2018.  He remains very active golfing multiple days per week.  No change in appetite or unintentional weight loss.   Review of Systems As per HPI, otherwise negative  Current Medications, Allergies, Past Medical History, Past Surgical History, Family History and Social History were reviewed in [employer] record.    Objective:   Physical Exam BP 138/66   Pulse 74   Ht 5' 9"$  (1.753 m)   Wt 184 lb (83.5 kg)   BMI 27.17 kg/m  Gen: awake, alert, NAD HEENT: anicteric  Abd: soft, NT/ND, +BS throughout Rectal: Normal external exam; palpable fissure in the posterior anal canal which is mildly tender; mild anal  stenosis without palpable mass; anoscopy deferred today due to tenderness Ext: no c/c/e Neuro: nonfocal      Assessment & Plan:  88 year old male with a history of GERD with reflux esophagitis, large hiatal hernia, chronic constipation who is here for follow-up to evaluate anorectal pain.   Anal fissure/anorectal pain --I suspect his symptoms are most consistent with fissure alone but he has not had direct visualization of the rectum in a number of years.  I recommended the following after discussion: -- Flexible sigmoidoscopy in the [place]; we reviewed the risk, benefits and alternatives and he is agreeable and wishes to proceed -- Continue MiraLAX 17 g daily -- Add diltiazem gel 2% twice daily for 2 to 4 weeks -- RectiCare as needed for anorectal pain per box instruction  2.  Chronic constipation --MiraLAX 17 g daily  3.  Colon cancer screening --negative Cologuard in 2018.  Based on prior discussions we had deferred additional screening given his lack of polyps on previous colonoscopies and negative Cologuard.  Given symptoms we are pursuing flex sig as above  30 minutes total spent today including patient facing time, coordination of care, reviewing medical history/procedures/pertinent radiology studies, and documentation of the encounter.

## 2022-06-25 NOTE — Patient Instructions (Addendum)
   It has been recommended to you by your physician that you have a(n) flex sigmoidoscopy completed. Per your request, we did not schedule the procedure(s) today. Please contact our office at [phone] should you decide to have the procedure completed. You will be scheduled for a pre-visit and procedure at that time.  Please purchase the following medications over the counter and take as directed: Recticare: use as needed  We have sent a prescription for Diltiazem 2% gel to [facility] for you. Using your index finger, you should apply a small amount of medication inside the rectum up to your first knuckle/joint twice daily x 4 weeks.  [facility]'s information is below: Address: [address] [place], [place] [address]  [phone]  *Please DO NOT go directly from our office to pick up this medication! Give the pharmacy 1 day to process the prescription as this is compounded and takes time to make.  _______________________________________________________  If your blood pressure at your visit was 140/90 or greater, please contact your primary care physician to follow up on this.  _______________________________________________________  If you are age 46 or older, your body mass index should be between 23-30. Your Body mass index is 27.17 kg/m. If this is out of the aforementioned range listed, please consider follow up with your Primary Care Provider.  If you are age 63 or younger, your body mass index should be between 19-25. Your Body mass index is 27.17 kg/m. If this is out of the aformentioned range listed, please consider follow up with your Primary Care Provider.   ________________________________________________________  The [facility] providers would like to encourage you to use [facility] to communicate with providers for non-urgent requests or questions.  Due to long hold times on the telephone, sending your provider a message by [facility] may be a  faster and more efficient way to get a response.  Please allow 48 business hours for a response.  Please remember that this is for non-urgent requests.  _______________________________________________________ It was a pleasure to see you today!  Thank you for trusting me with your gastrointestinal care!

## 2022-06-26 ENCOUNTER — Telehealth: Payer: Self-pay | Admitting: Internal Medicine

## 2022-06-26 NOTE — Telephone Encounter (Signed)
Inbound call from patient, he scheduled his Flex sig for 4/4 at 4:00 PM. Please send patient instructions, he also would like a nurse to call and go over instructions.   Thank you

## 2022-06-27 ENCOUNTER — Other Ambulatory Visit: Payer: Self-pay | Admitting: Internal Medicine

## 2022-06-27 NOTE — Addendum Note (Signed)
Addended by: Ruthe Mannan on: 06/27/2022 03:34 PM   Modules accepted: Orders

## 2022-07-01 IMAGING — DX DG LUMBAR SPINE 2-3V
3 series · 3 of 3 positions shown · non-contrast
Comparison: Lumbar radiograph 10/11/2018

CLINICAL DATA: Lumbar spine pain patient reports right low back
pain for 5 days after swinging golf club.

EXAM:
LUMBAR SPINE - 2-3 VIEW

[l-spine ap]
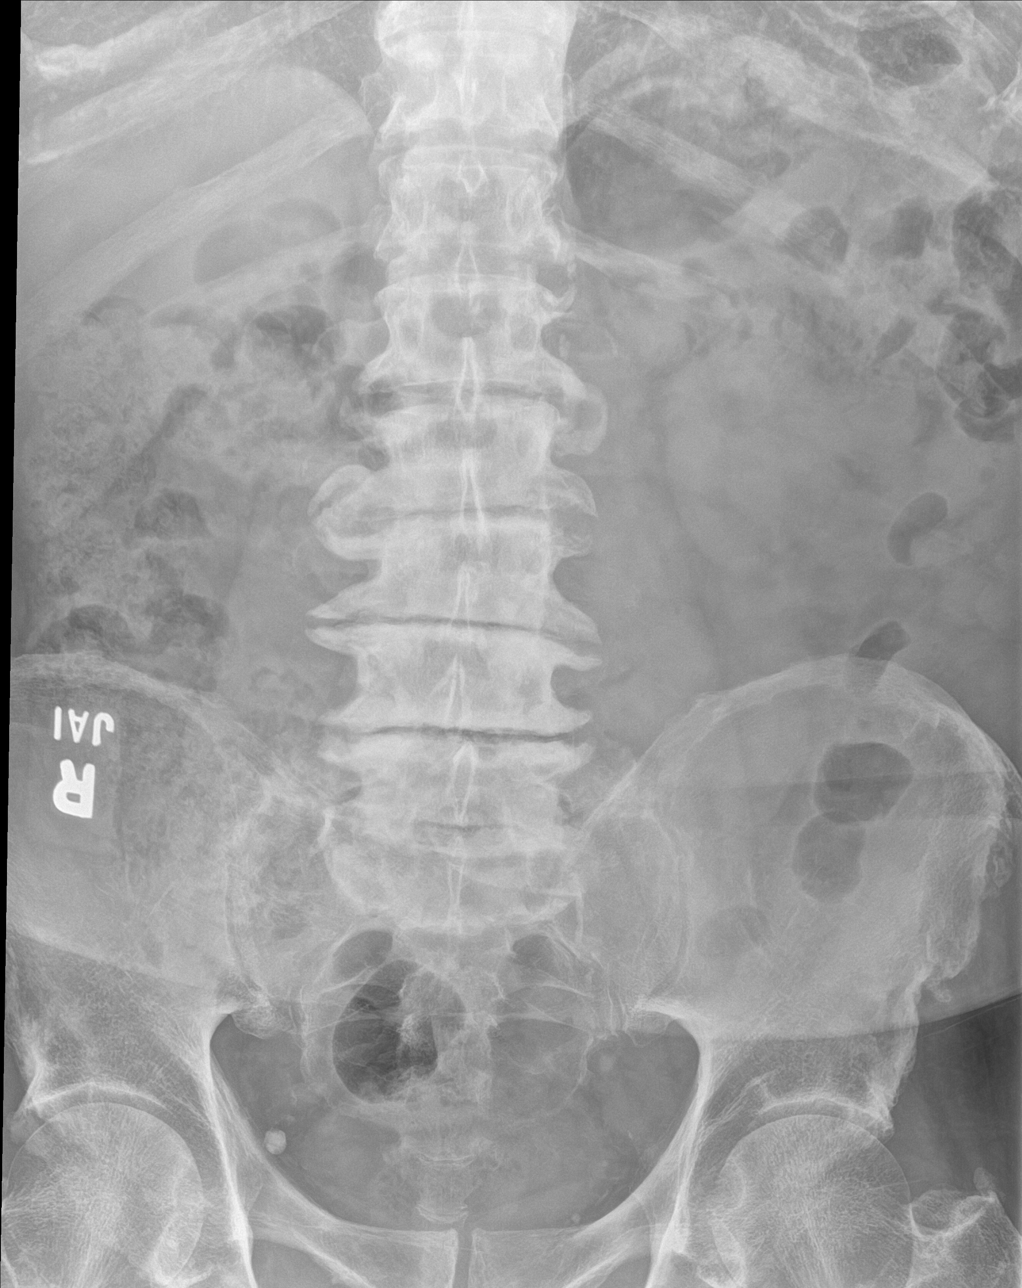

[l-spine lateral]
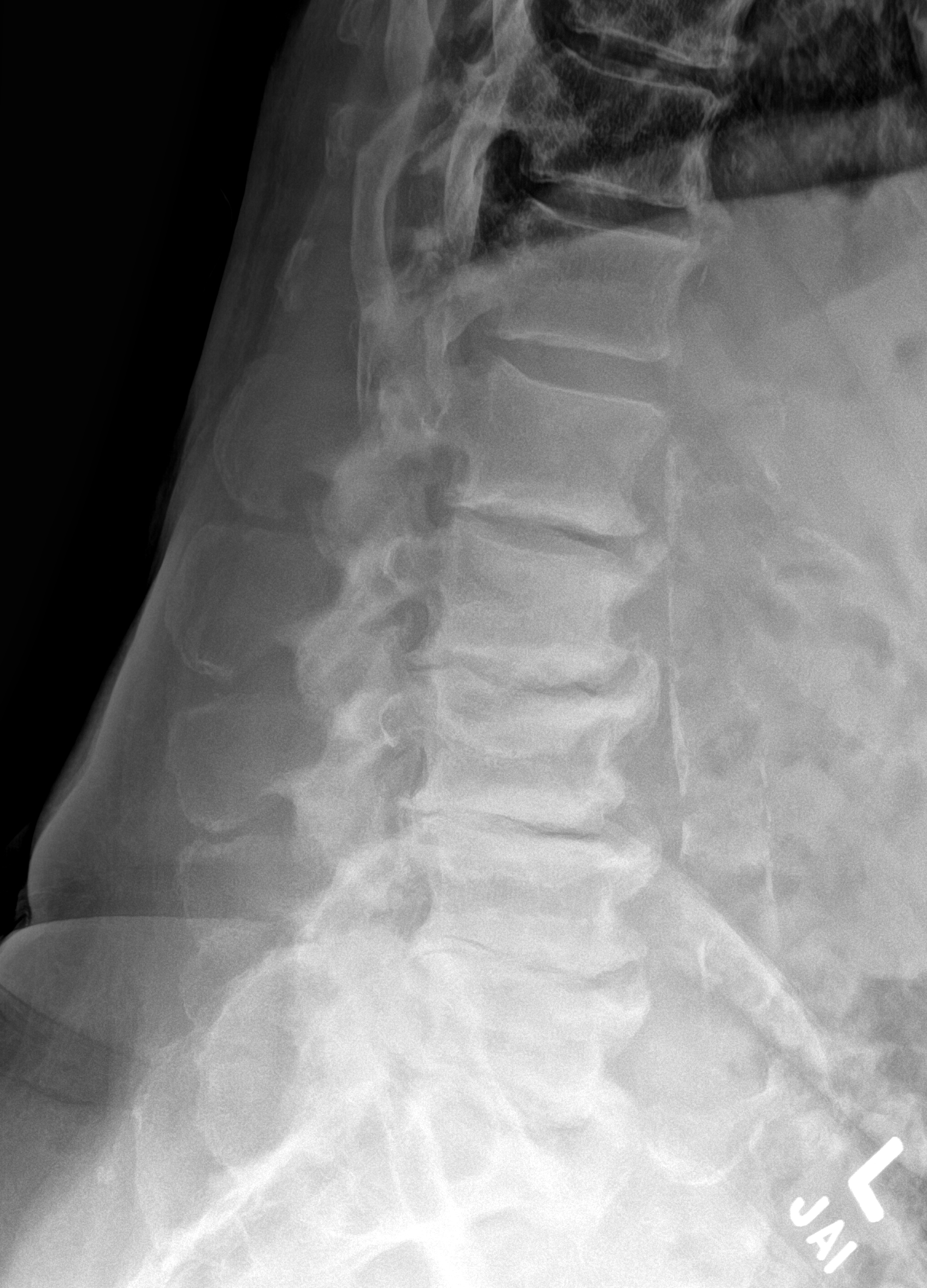

[l-spine spot]
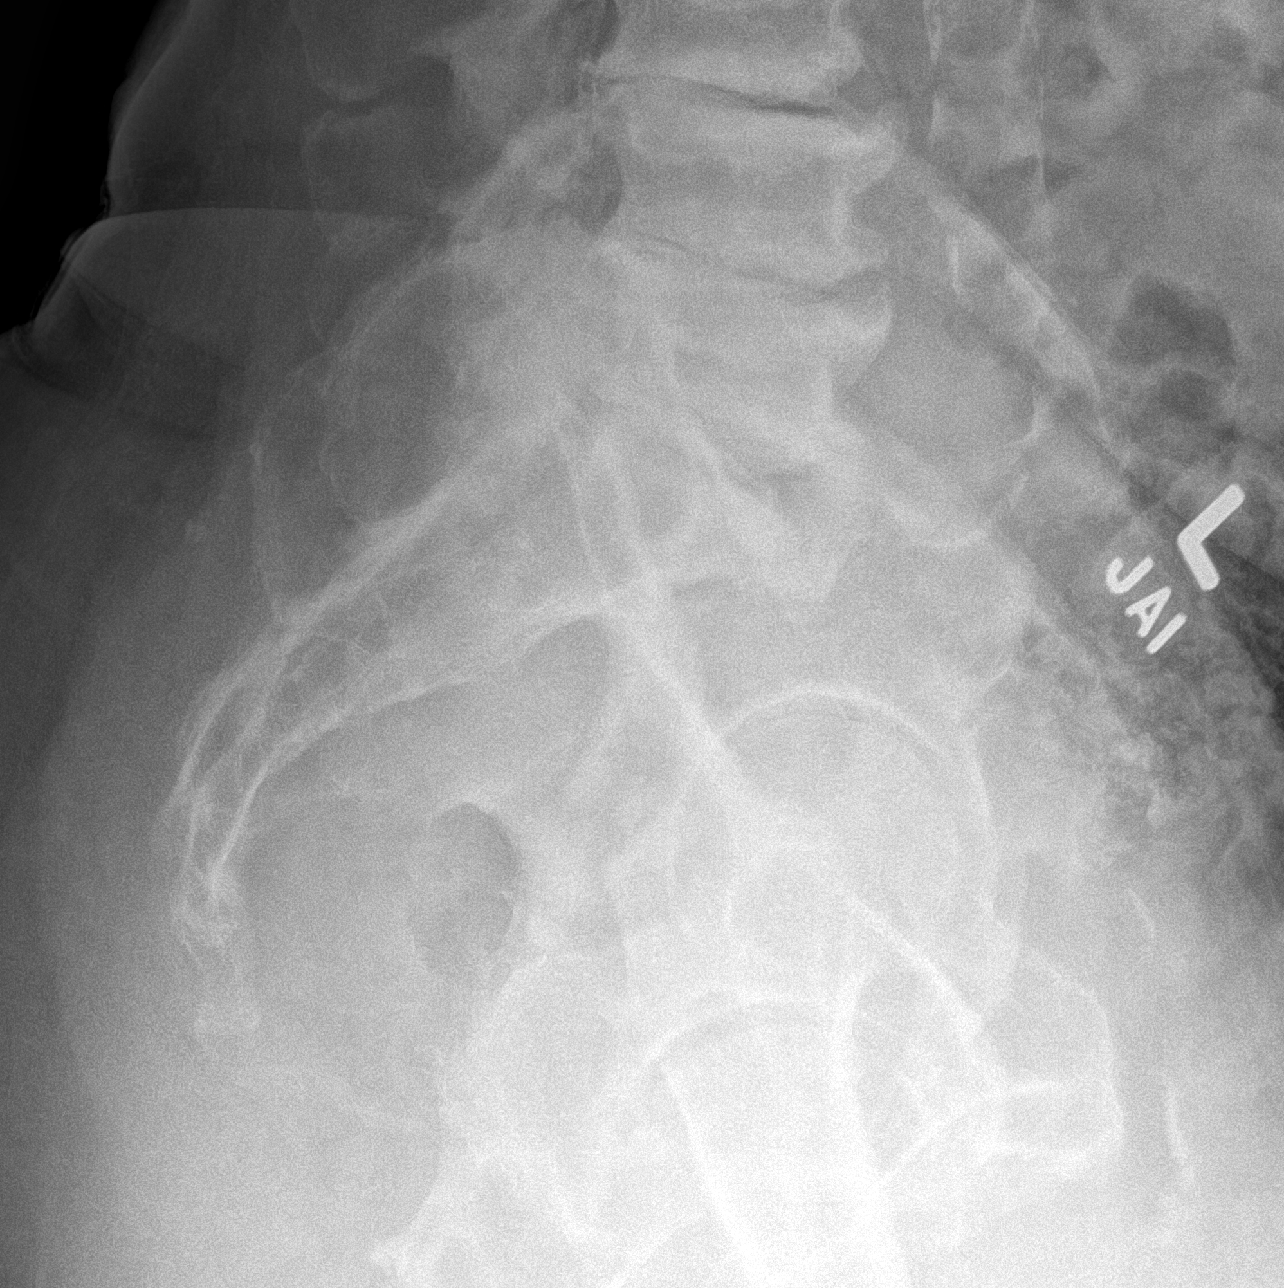

[3 of 3 positions shown; findings below may reference images not displayed]

FINDINGS: Five lumbar type vertebra. Reversal of normal lordosis, with
progression of straightening on prior. Slight retrolisthesis of L2
and L3 and L3 on L4 is similar to prior exam. There is advanced disc
space narrowing and endplate spurring at all levels. Progressive
degenerative disc disease at L1-L2 from prior. Diffuse vacuum
phenomenon. Facet hypertrophy from L2-L3 through the lumbosacral
junction. The vertebral body heights are preserved. There is no
evidence of fracture or bony destruction. The sacroiliac joints are
congruent.
IMPRESSION: 1. Advanced multilevel degenerative disc disease, majority is stable
from prior, there is mild progression at L1-L2 from 7575 exam.
2. Multilevel facet hypertrophy.
3. Reversal of normal lordosis can be seen in the setting of muscle
spasm.

## 2022-07-15 DIAGNOSIS — I129 Hypertensive chronic kidney disease with stage 1 through stage 4 chronic kidney disease, or unspecified chronic kidney disease: Secondary | ICD-10-CM | POA: Diagnosis not present

## 2022-07-15 DIAGNOSIS — N400 Enlarged prostate without lower urinary tract symptoms: Secondary | ICD-10-CM | POA: Diagnosis not present

## 2022-07-15 DIAGNOSIS — N1832 Chronic kidney disease, stage 3b: Secondary | ICD-10-CM | POA: Diagnosis not present

## 2022-07-15 DIAGNOSIS — K219 Gastro-esophageal reflux disease without esophagitis: Secondary | ICD-10-CM | POA: Diagnosis not present

## 2022-07-17 NOTE — Progress Notes (Signed)
[place] Phone: [phone] Subjective:   [name], am serving as a scribe for Dr. Hulan Saas.  I'm seeing this patient by the request  of:  [name], MD  CC: Back and neck pain  [id number]  Paul [name] is a 88 y.o. male coming in with complaint of back and neck pain. OMT 06/03/2022. Patient states that he has been having intermittent in his back.   L scapular pain that radiates into shoulder joint. Does have pain occasionally with golf and his dog pulls him which increases his pain. Injection last visit did not help.   Medications patient has been prescribed: None  Taking:         Reviewed prior external information including notes and imaging from previsou exam, outside providers and external EMR if available.   As well as notes that were available from care everywhere and other healthcare systems.  Past medical history, social, surgical and family history all reviewed in electronic medical record.  No pertanent information unless stated regarding to the chief complaint.   Past Medical History:  Diagnosis Date   GERD (gastroesophageal reflux disease)    Hiatal hernia    History of cataract    Hypertension    Spinal stenosis     Allergies  Allergen Reactions   Ace Inhibitors Swelling    After taking for long time had (facial) angioedema on lisinopril     Review of Systems:  No headache, visual changes, nausea, vomiting, diarrhea, constipation, dizziness, abdominal pain, skin rash, fevers, chills, night sweats, weight loss, swollen lymph nodes, body aches, joint swelling, chest pain, shortness of breath, mood changes. POSITIVE muscle aches  Objective  Blood pressure 122/78, pulse 73, height 5\' 9"  (1.753 m), weight 182 lb (82.6 kg), SpO2 97 %.   General: No apparent distress alert and oriented x3 mood and affect normal, dressed appropriately.  HEENT: Pupils equal, extraocular  movements intact  Respiratory: Patient's speak in full sentences and does not appear short of breath  Cardiovascular: No lower extremity edema, non tender, no erythema  Left shoulder exam shows positive impingement noted.  Rotator cuff strength 3 out of 5 noted compared to the contralateral side.  Positive crossover and impingement with Neer and Hawkins.  Limited muscular skeletal ultrasound was performed and interpreted by Hulan Saas, M Limited ultrasound shows the patient does have what appears to be an intrasubstance tear of the supraspinatus noted.  Osteopathic findings  C2 flexed rotated and side bent right C7 flexed rotated and side bent left T3 extended rotated and side bent right inhaled rib T9 extended rotated and side bent left L3 flexed rotated and side bent right Sacrum right on right       Assessment and Plan:  Left rotator cuff tear With decreased hypoechoic changes can seen on the patient does have a partial rotator cuff tear noted.  It seems to be intersubstance.  No retraction.  Patient strength seems weakner failed 6 weeks of  physical therapy do feel advanced imaging is warranted at this time like to further evaluate the severity of this tear.  Depending on the MRI we will see if patient would need surgical intervention, PRP or consider more conservative therapy..  Given some other intermittent exercises at the moment but to avoid certain ones as well.    Nonallopathic problems  Decision today to treat with OMT was based on Physical Exam  After verbal consent patient  was treated with HVLA, ME, FPR techniques in cervical, rib, thoracic, lumbar, and sacral  areas  Patient tolerated the procedure well with improvement in symptoms  Patient given exercises, stretches and lifestyle modifications  See medications in patient instructions if given  Patient will follow up in 4-8 weeks    The above documentation has been reviewed and is accurate and complete  Lyndal Pulley, DO          Note: This dictation was prepared with Dragon dictation along with smaller phrase technology. Any transcriptional errors that result from this process are unintentional.

## 2022-07-24 DIAGNOSIS — H35363 Drusen (degenerative) of macula, bilateral: Secondary | ICD-10-CM | POA: Diagnosis not present

## 2022-07-24 DIAGNOSIS — Z961 Presence of intraocular lens: Secondary | ICD-10-CM | POA: Diagnosis not present

## 2022-07-24 DIAGNOSIS — H353131 Nonexudative age-related macular degeneration, bilateral, early dry stage: Secondary | ICD-10-CM | POA: Diagnosis not present

## 2022-07-24 DIAGNOSIS — H35453 Secondary pigmentary degeneration, bilateral: Secondary | ICD-10-CM | POA: Diagnosis not present

## 2022-07-24 DIAGNOSIS — H35033 Hypertensive retinopathy, bilateral: Secondary | ICD-10-CM | POA: Diagnosis not present

## 2022-07-24 DIAGNOSIS — H35722 Serous detachment of retinal pigment epithelium, left eye: Secondary | ICD-10-CM | POA: Diagnosis not present

## 2022-07-29 ENCOUNTER — Other Ambulatory Visit: Payer: Self-pay

## 2022-07-29 ENCOUNTER — Ambulatory Visit: Payer: Medicare PPO | Admitting: Family Medicine

## 2022-07-29 ENCOUNTER — Ambulatory Visit (INDEPENDENT_AMBULATORY_CARE_PROVIDER_SITE_OTHER): Payer: Medicare PPO

## 2022-07-29 VITALS — BP 122/78 | HR 73 | Ht 69.0 in | Wt 182.0 lb

## 2022-07-29 DIAGNOSIS — G8929 Other chronic pain: Secondary | ICD-10-CM | POA: Diagnosis not present

## 2022-07-29 DIAGNOSIS — M75112 Incomplete rotator cuff tear or rupture of left shoulder, not specified as traumatic: Secondary | ICD-10-CM

## 2022-07-29 DIAGNOSIS — M9902 Segmental and somatic dysfunction of thoracic region: Secondary | ICD-10-CM | POA: Diagnosis not present

## 2022-07-29 DIAGNOSIS — M75102 Unspecified rotator cuff tear or rupture of left shoulder, not specified as traumatic: Secondary | ICD-10-CM | POA: Insufficient documentation

## 2022-07-29 DIAGNOSIS — M9903 Segmental and somatic dysfunction of lumbar region: Secondary | ICD-10-CM

## 2022-07-29 DIAGNOSIS — M25512 Pain in left shoulder: Secondary | ICD-10-CM

## 2022-07-29 DIAGNOSIS — M9901 Segmental and somatic dysfunction of cervical region: Secondary | ICD-10-CM | POA: Diagnosis not present

## 2022-07-29 DIAGNOSIS — M9908 Segmental and somatic dysfunction of rib cage: Secondary | ICD-10-CM | POA: Diagnosis not present

## 2022-07-29 DIAGNOSIS — M9904 Segmental and somatic dysfunction of sacral region: Secondary | ICD-10-CM

## 2022-07-29 NOTE — Patient Instructions (Addendum)
Xray today MRI L shoulder [phone] We will be in touch See me again in 8 weeks

## 2022-07-29 NOTE — Assessment & Plan Note (Signed)
With decreased hypoechoic changes can seen on the patient does have a partial rotator cuff tear noted.  It seems to be intersubstance.  No retraction.  Patient strength seems weakner failed 6 weeks of  physical therapy do feel advanced imaging is warranted at this time like to further evaluate the severity of this tear.  Depending on the MRI we will see if patient would need surgical intervention, PRP or consider more conservative therapy..  Given some other intermittent exercises at the moment but to avoid certain ones as well.

## 2022-07-30 ENCOUNTER — Encounter: Payer: Self-pay | Admitting: Internal Medicine

## 2022-07-30 ENCOUNTER — Ambulatory Visit (AMBULATORY_SURGERY_CENTER): Payer: Medicare PPO | Admitting: Internal Medicine

## 2022-07-30 VITALS — BP 154/78 | HR 60 | Temp 97.8°F | Resp 12 | Ht 69.0 in | Wt 184.0 lb

## 2022-07-30 DIAGNOSIS — D125 Benign neoplasm of sigmoid colon: Secondary | ICD-10-CM | POA: Diagnosis not present

## 2022-07-30 DIAGNOSIS — I10 Essential (primary) hypertension: Secondary | ICD-10-CM | POA: Diagnosis not present

## 2022-07-30 DIAGNOSIS — K6289 Other specified diseases of anus and rectum: Secondary | ICD-10-CM

## 2022-07-30 MED ORDER — SODIUM CHLORIDE 0.9 % IV SOLN: 500.0000 mL | Freq: Once | INTRAVENOUS | Status: DC

## 2022-07-30 MED ORDER — DILTIAZEM GEL 2 %: 1.0000 | Freq: Two times a day (BID) | CUTANEOUS | 0 refills | Status: AC

## 2022-07-30 NOTE — Progress Notes (Signed)
GASTROENTEROLOGY PROCEDURE H&P NOTE   Primary Care Physician: [name], MD    Reason for Procedure:  Anorectal pain  Plan:    Flex sig  Patient is appropriate for endoscopic procedure(s) in the ambulatory ([place]) setting.  The nature of the procedure, as well as the risks, benefits, and alternatives were carefully and thoroughly reviewed with the patient. Ample time for discussion and questions allowed. The patient understood, was satisfied, and agreed to proceed.     HPI: Paul [name] is a 88 y.o. male who presents for flexible sigmoidoscopy.  Medical history as below.  Tolerated the prep.  No recent chest pain or shortness of breath.  No abdominal pain today.  Past Medical History:  Diagnosis Date   Enlarged prostate    GERD (gastroesophageal reflux disease)    Heart murmur    Hiatal hernia    History of cataract    Hypertension    Spinal stenosis     Past Surgical History:  Procedure Laterality Date   CATARACT EXTRACTION, BILATERAL     TOTAL KNEE ARTHROPLASTY     left   TRANSURETHRAL RESECTION OF PROSTATE  2011    Prior to Admission medications   Medication Sig Start Date End Date Taking? Authorizing Provider  allopurinol (ZYLOPRIM) 100 MG tablet TAKE 2 TABLETS(200 MG) BY MOUTH DAILY 09/25/21  Yes [name], MD  aspirin 81 MG chewable tablet Chew 81 mg by mouth daily.   Yes [provider]  cloNIDine (CATAPRES - DOSED IN MG/24 HR) 0.3 mg/24hr patch APPLY 1 PATCH TOPICALLY TO THE SKIN EVERY WEEK 04/14/22  Yes [name], MD  diltiazem 2 % GEL Apply 1 Application topically 2 (two) times daily. Using your index finger, apply a small amount of medication inside the rectum up to your first knuckle/joint twice daily x 4 weeks. 06/25/22  Yes Skylar Flynt, Lajuan Lines, MD  felodipine (PLENDIL) 10 MG 24 hr tablet TAKE 1 TABLET(10 MG) BY MOUTH DAILY 02/26/22  Yes [name], MD  hydrochlorothiazide (HYDRODIURIL) 25 MG tablet TAKE 1 TABLET BY MOUTH DAILY 06/06/22  Yes [name], MD  mirabegron ER (MYRBETRIQ) 25 MG TB24 tablet Take 1 tablet by mouth daily.   Yes [provider]  pantoprazole (PROTONIX) 40 MG tablet TAKE 1 TABLET(40 MG) BY MOUTH TWICE DAILY BEFORE A MEAL 06/27/22  Yes Candace Begue, Lajuan Lines, MD  polyethylene glycol powder (GLYCOLAX/MIRALAX) 17 GM/SCOOP powder Take one or two scoops by mouth daily as directed 07/03/21  Yes [name], MD  pravastatin (PRAVACHOL) 40 MG tablet TAKE 1 TABLET(40 MG) BY MOUTH DAILY 09/25/21  Yes [name], MD  Cholecalciferol (VITAMIN D3 PO) Take 4,000 Units by mouth daily.    [provider]  Cyanocobalamin (VITAMIN B-12 PO) Take 1 capsule by mouth daily.    [provider]  diclofenac sodium (VOLTAREN) 1 % GEL Apply 2 g topically 4 (four) times daily. 10/29/18   [name], MD  fluticasone (FLONASE) 50 MCG/ACT nasal spray Place 2 sprays into both nostrils daily. 11/19/20   Lyndee Hensen, DO  loratadine (CLARITIN) 10 MG tablet Take 1 tablet (10 mg total) by mouth daily. 11/19/20   Lyndee Hensen, DO  Multiple Vitamins-Minerals (ICAPS AREDS 2 PO) Take 1 capsule by mouth 2 (two) times a day.    [provider]  Pyridoxine HCl (VITAMIN B6 PO) Take 1 capsule by mouth daily.    [provider]  sildenafil (REVATIO) 20 MG tablet  TAKE ONE TABLET BY MOUTH ONE TIME DAILY AS DIRECTED FOR ERECTILE DYSFUNCTION 10/08/21   [name], MD  Zoster Vaccine Adjuvanted [facility]) injection Inject 0.5 ml for first dose and then repeat once per immunization schedule as directed for second final dose 07/25/20   [name], MD    Current Outpatient Medications  Medication Sig Dispense Refill   allopurinol (ZYLOPRIM) 100 MG tablet TAKE 2 TABLETS(200 MG) BY MOUTH DAILY 180 tablet 3   aspirin 81 MG chewable tablet Chew 81 mg by mouth daily.     cloNIDine (CATAPRES - DOSED IN MG/24 HR) 0.3 mg/24hr patch APPLY 1 PATCH TOPICALLY TO THE SKIN EVERY WEEK 12 patch 3   diltiazem 2 % GEL Apply 1 Application  topically 2 (two) times daily. Using your index finger, apply a small amount of medication inside the rectum up to your first knuckle/joint twice daily x 4 weeks. 30 g 0   felodipine (PLENDIL) 10 MG 24 hr tablet TAKE 1 TABLET(10 MG) BY MOUTH DAILY 90 tablet 3   hydrochlorothiazide (HYDRODIURIL) 25 MG tablet TAKE 1 TABLET BY MOUTH DAILY 90 tablet 3   mirabegron ER (MYRBETRIQ) 25 MG TB24 tablet Take 1 tablet by mouth daily.     pantoprazole (PROTONIX) 40 MG tablet TAKE 1 TABLET(40 MG) BY MOUTH TWICE DAILY BEFORE A MEAL 180 tablet 0   polyethylene glycol powder (GLYCOLAX/MIRALAX) 17 GM/SCOOP powder Take one or two scoops by mouth daily as directed 952 g 12   pravastatin (PRAVACHOL) 40 MG tablet TAKE 1 TABLET(40 MG) BY MOUTH DAILY 90 tablet 3   Cholecalciferol (VITAMIN D3 PO) Take 4,000 Units by mouth daily.     Cyanocobalamin (VITAMIN B-12 PO) Take 1 capsule by mouth daily.     diclofenac sodium (VOLTAREN) 1 % GEL Apply 2 g topically 4 (four) times daily. 150 g 1   fluticasone (FLONASE) 50 MCG/ACT nasal spray Place 2 sprays into both nostrils daily. 16 g 6   loratadine (CLARITIN) 10 MG tablet Take 1 tablet (10 mg total) by mouth daily. 30 tablet 11   Multiple Vitamins-Minerals (ICAPS AREDS 2 PO) Take 1 capsule by mouth 2 (two) times a day.     Pyridoxine HCl (VITAMIN B6 PO) Take 1 capsule by mouth daily.     sildenafil (REVATIO) 20 MG tablet TAKE ONE TABLET BY MOUTH ONE TIME DAILY AS DIRECTED FOR ERECTILE DYSFUNCTION 30 tablet 5   Zoster Vaccine Adjuvanted [facility]) injection Inject 0.5 ml for first dose and then repeat once per immunization schedule as directed for second final dose 0.5 mL 1   Current Facility-Administered Medications  Medication Dose Route Frequency Provider Last Rate Last Admin   0.9 %  sodium chloride infusion  500 mL Intravenous Once Vi Biddinger, Lajuan Lines, MD        Allergies as of 07/30/2022 - Review Complete 07/30/2022  Allergen Reaction Noted   Ace inhibitors Swelling  04/16/2018    Family History  Problem Relation Age of Onset   Diabetes Mother    Kidney disease Father    Cancer Sister        ? pancreatic   Cancer Sister        ? 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   Drug use: No   Sexual activity: Yes  Other Topics Concern   Not on file  Social History Narrative   Health Care POA:    Emergency Contact:    End of Life Plan:    Who lives with you: Lives by himself in 1 story home.    Any pets: none   Diet: Patient has a varied diet of protein, starch, and vegetables.  Is currently working with [place] for nutrition.   Exercise: Patient golfs several times a week and does yoga at home 2x week. Patient enjoys yard work and participates in [place] at the [employer]: Patient reports wearing seat belt when in vehicle.   Nancy Fetter Exposure/Protection: Patient reports wearing sun screen intermittently.    Hobbies: golfing, teaching at [employer]      Social Determinants of Health   Financial Resource Strain: Low Risk  (12/13/2020)   Overall Financial Resource  Strain (CARDIA)    Difficulty of Paying Living Expenses: Not hard at all  Food Insecurity: No Food Insecurity (12/03/2021)   Hunger Vital Sign    Worried About Running Out of Food in the Last Year: Never true    Ran Out of Food in the Last Year: Never true  Transportation Needs: No Transportation Needs (12/13/2020)   PRAPARE - Hydrologist (Medical): No    Lack of Transportation (Non-Medical): No  Physical Activity: Insufficiently Active (12/13/2020)   Exercise Vital Sign    Days of Exercise per Week: 3 days    Minutes of Exercise per Session: 20 min  Stress: No Stress Concern Present (12/13/2020)   [place]    Feeling of Stress : Not at all  Social Connections: [place] (12/13/2020)   Social Connection and Isolation Panel [NHANES]    Frequency of Communication with Friends and Family: More than three times a week    Frequency of Social Gatherings with Friends and Family: More than three times a week    Attends Religious Services: More than 4 times per year    Active Member of [employer] or Organizations: Yes    Attends Archivist Meetings: More than 4 times per year    Marital Status: Living with partner  Intimate Partner Violence: Not At Risk (12/13/2020)   Humiliation, Afraid, Rape, and Kick questionnaire    Fear of Current or Ex-Partner: No    Emotionally Abused: No    Physically Abused: No    Sexually Abused: No    Physical Exam: Vital signs in last 24 hours: @BP   127/70   Pulse 68   Temp 97.8 F (36.6 C)   Ht 5\' 9"  (1.753 m)   Wt 184 lb (83.5 kg)   SpO2 98%   BMI 27.17 kg/m  GEN: NAD EYE: Sclerae anicteric ENT: MMM CV: Non-tachycardic Pulm: CTA b/l GI: Soft, NT/ND NEURO:  Alert & Oriented x 3   Zenovia Jarred, MD [facility]  07/30/2022 3:40 PM

## 2022-07-30 NOTE — Op Note (Signed)
[place] Patient Name: Paul [name] Procedure Date: 07/30/2022 3:37 PM MRN: [id number] Endoscopist: Jerene Bears , MD, [id number] Age: 88 Referring MD:  Date of Birth: [birth year] Gender: Male Account #: [id number] Procedure:                Flexible Sigmoidoscopy Indications:              Anorectal pain, treated for fissure Medicines:                Monitored Anesthesia Care Procedure:                Pre-Anesthesia Assessment:                           - Prior to the procedure, a History and Physical                            was performed, and patient medications and                            allergies were reviewed. The patient's tolerance of                            previous anesthesia was also reviewed. The risks                            and benefits of the procedure and the sedation                            options and risks were discussed with the patient.                            All questions were answered, and informed consent                            was obtained. Prior Anticoagulants: The patient has                            taken no anticoagulant or antiplatelet agents. ASA                            Grade Assessment: III - A patient with severe                            systemic disease. After reviewing the risks and                            benefits, the patient was deemed in satisfactory                            condition to undergo the procedure.                           After obtaining informed consent, the scope was  passed under direct vision. The Olympus [id number]-H190DL                            [id number]) Colonoscope was introduced through the                            anus and advanced to the sigmoid colon. The                            flexible sigmoidoscopy was accomplished without                            difficulty. The patient tolerated the procedure                            well. The quality of the  bowel preparation was good. Scope In: 3:53:35 PM Scope Out: 4:00:15 PM Total Procedure Duration: 0 hours 6 minutes 40 seconds  Findings:                 Two semi-pedunculated polyps were found in the                            distal sigmoid colon. The polyps were 6 to 7 mm in                            size. These polyps were removed with a hot snare.                            Resection and retrieval were complete.                           Internal hemorrhoids were found during                            retroflexion. The hemorrhoids were medium-sized.                           The exam was otherwise without abnormality. Complications:            No immediate complications. Estimated Blood Loss:     Estimated blood loss: none. Impression:               - Two 6 to 7 mm polyps in the distal sigmoid colon,                            removed with a hot snare. Resected and retrieved.                           - Internal hemorrhoids.                           - The examination was otherwise normal. Recommendation:           - Patient has a contact number available for  emergencies. The signs and symptoms of potential                            delayed complications were discussed with the                            patient. Return to normal activities tomorrow.                            Written discharge instructions were provided to the                            patient.                           - Resume previous diet.                           - Await pathology results.                           - Continue present medications. Jerene Bears, MD 07/30/2022 4:06:08 PM This report has been signed electronically.

## 2022-07-30 NOTE — Progress Notes (Unsigned)
Sedate, gd SR, tolerated procedure well, VSS, report to RN 

## 2022-07-30 NOTE — Patient Instructions (Signed)
YOU HAD AN ENDOSCOPIC PROCEDURE TODAY AT THE [facility]:   Refer to the procedure report that was given to you for any specific questions about what was found during the examination.  If the procedure report does not answer your questions, please call your gastroenterologist to clarify.  If you requested that your care partner not be given the details of your procedure findings, then the procedure report has been included in a sealed envelope for you to review at your convenience later.  YOU SHOULD EXPECT: Some feelings of bloating in the abdomen. Passage of more gas than usual.  Walking can help get rid of the air that was put into your GI tract during the procedure and reduce the bloating. If you had a lower endoscopy (such as a colonoscopy or flexible sigmoidoscopy) you may notice spotting of blood in your stool or on the toilet paper. If you underwent a bowel prep for your procedure, you may not have a normal bowel movement for a few days.  Please Note:  You might notice some irritation and congestion in your nose or some drainage.  This is from the oxygen used during your procedure.  There is no need for concern and it should clear up in a day or so.  SYMPTOMS TO REPORT IMMEDIATELY:  Following lower endoscopy (colonoscopy or flexible sigmoidoscopy):  Excessive amounts of blood in the stool  Significant tenderness or worsening of abdominal pains  Swelling of the abdomen that is new, acute  Fever of 100F or higher  For urgent or emergent issues, a gastroenterologist can be reached at any hour by calling [phone]. Do not use MyChart messaging for urgent concerns.    DIET:  We do recommend a small meal at first, but then you may proceed to your regular diet.  Drink plenty of fluids but you should avoid alcoholic beverages for 24 hours.  ACTIVITY:  You should plan to take it easy for the rest of today and you should NOT DRIVE or use heavy machinery until tomorrow (because of  the sedation medicines used during the test).    FOLLOW UP: Our staff will call the number listed on your records the next business day following your procedure.  We will call around 7:15- 8:00 am to check on you and address any questions or concerns that you may have regarding the information given to you following your procedure. If we do not reach you, we will leave a message.     If any biopsies were taken you will be contacted by phone or by letter within the next 1-3 weeks.  Please call us at [phone] if you have not heard about the biopsies in 3 weeks.    SIGNATURES/CONFIDENTIALITY: You and/or your care partner have signed paperwork which will be entered into your electronic medical record.  These signatures attest to the fact that that the information above on your After Visit Summary has been reviewed and is understood.  Full responsibility of the confidentiality of this discharge information lies with you and/or your care-partner.  

## 2022-07-31 ENCOUNTER — Telehealth: Payer: Self-pay | Admitting: *Deleted

## 2022-07-31 NOTE — Telephone Encounter (Signed)
  Follow up Call-     07/30/2022    2:55 PM 09/03/2020    8:59 AM  Call back number  Post procedure Call Back phone  # [phone]  Permission to leave phone message Yes Yes     Patient questions:  Do you have a fever, pain , or abdominal swelling? No. Pain Score  0 *  Have you tolerated food without any problems? Yes.    Have you been able to return to your normal activities? Yes.    Do you have any questions about your discharge instructions: Diet   No. Medications  No. Follow up visit  No.  Do you have questions or concerns about your Care? No.  Actions: * If pain score is 4 or above: No action needed, pain <4.

## 2022-08-06 ENCOUNTER — Encounter: Payer: Self-pay | Admitting: Internal Medicine

## 2022-08-07 ENCOUNTER — Other Ambulatory Visit: Payer: Self-pay

## 2022-08-07 ENCOUNTER — Ambulatory Visit: Payer: Medicare PPO | Admitting: Sports Medicine

## 2022-08-07 VITALS — BP 134/74 | Ht 68.0 in | Wt 182.0 lb

## 2022-08-07 DIAGNOSIS — M25551 Pain in right hip: Secondary | ICD-10-CM | POA: Diagnosis not present

## 2022-08-07 DIAGNOSIS — S8011XA Contusion of right lower leg, initial encounter: Secondary | ICD-10-CM | POA: Diagnosis not present

## 2022-08-07 NOTE — Progress Notes (Signed)
RT hip injury  Kue was walking his dog this morning The dog chased a car and tripped him He fell onto his RT side Some RT lat hip/ shoulder and mild forehead pain after the fall Those resolved quickly but he noted a bump over his lateral RT hip that came up quickly Slightly discolored Not that painful  Comes to have this checked  Older AA M in NAD Alert Ox3 BP 134/74   Ht 5\' 8"  (1.727 m)   Wt 182 lb (82.6 kg)   BMI 27.67 kg/m   RT hip has mildly reduced ROM with 50 deg total rotation Flexion is normal Strength is normal Over lateral hip just above the GT on RT there is soft tissue swelling about 3 cm in diameter This is not TTP but slightly darker color  Ultrasound of RT Hip  There is a circumscribed area above the superior facet of the greater trochanter This is hypoechoic and measures 2 x 2.3 cms length and width This appears over the gluteus medius  There is also some slight hypoechoic change between gluteus minimus and medius No bony irregularity at the greater trochanter  Impression:  Small hematoma of soft tissue of lateral hip  Ultrasound and interpretation by Sibyl Parr. Darrick Penna, MD

## 2022-08-07 NOTE — Assessment & Plan Note (Signed)
Ice application Compression Lateral leg lifts and HEP  No medication suggested  Recheck if this does not resolve over 2 weeks

## 2022-08-11 ENCOUNTER — Encounter: Payer: Self-pay | Admitting: *Deleted

## 2022-08-12 ENCOUNTER — Ambulatory Visit (INDEPENDENT_AMBULATORY_CARE_PROVIDER_SITE_OTHER)
Admission: RE | Admit: 2022-08-12 | Discharge: 2022-08-12 | Disposition: A | Discharge status: Home / Self Care | Payer: Medicare PPO | Source: Ambulatory Visit | Attending: Family Medicine | Admitting: Family Medicine

## 2022-08-12 ENCOUNTER — Encounter: Payer: Self-pay | Admitting: Family Medicine

## 2022-08-12 ENCOUNTER — Telehealth: Payer: Self-pay | Admitting: Family Medicine

## 2022-08-12 ENCOUNTER — Ambulatory Visit: Payer: Medicare PPO | Admitting: Family Medicine

## 2022-08-12 VITALS — BP 124/86 | HR 86 | Ht 68.0 in | Wt 181.0 lb

## 2022-08-12 DIAGNOSIS — R0781 Pleurodynia: Secondary | ICD-10-CM | POA: Diagnosis not present

## 2022-08-12 DIAGNOSIS — R0789 Other chest pain: Secondary | ICD-10-CM | POA: Diagnosis not present

## 2022-08-12 DIAGNOSIS — M25511 Pain in right shoulder: Secondary | ICD-10-CM

## 2022-08-12 DIAGNOSIS — S29011A Strain of muscle and tendon of front wall of thorax, initial encounter: Secondary | ICD-10-CM | POA: Diagnosis not present

## 2022-08-12 NOTE — Telephone Encounter (Signed)
Patient called asking if Paul [name] or Paul [name] could call him regarding some exercises.  Please advise.

## 2022-08-12 NOTE — Patient Instructions (Signed)
R rib series at [facility] [place] 20 min every 4 hours Will get worse before it gets better See me as scheduled

## 2022-08-12 NOTE — Telephone Encounter (Signed)
Patient was seen in the office today

## 2022-08-12 NOTE — Progress Notes (Signed)
[name] [address] [place] [address] Phone: [phone] Subjective:   Paul [name], am serving as a scribe for Dr. Antoine Primas.  I'm seeing this patient by the request  of:  Paul Ramp, MD  CC: Right chest pain after activity  [id number]  Paul [name] is a 88 y.o. male coming in with complaint of pec strain. Patient states that he was pushing himself up off the floor yesterday and felt a sudden, sharp pain in R pec. Slept upright and has been icing. When he rolls his shoulders and inhales deeply, he feels pain in the pec.        Past Medical History:  Diagnosis Date   Enlarged prostate    GERD (gastroesophageal reflux disease)    Heart murmur    Hiatal hernia    History of cataract    Hypertension    Spinal stenosis    Past Surgical History:  Procedure Laterality Date   CATARACT EXTRACTION, BILATERAL     TOTAL KNEE ARTHROPLASTY     left   TRANSURETHRAL RESECTION OF PROSTATE  2011   Social History   Socioeconomic History   Marital status: Significant Other    Spouse name: Not on file   Number of children: 0   Years of education: 21 years   Highest education level: Doctorate  Occupational History   Occupation: retired    Associate Professor: [employer]    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   Drug use: No   Sexual activity: Yes  Other Topics Concern   Not on file  Social History Narrative   Health Care POA:    Emergency Contact:    End of Life Plan:    Who lives with you: Lives by himself in 1  story home.    Any pets: none   Diet: Patient has a varied diet of protein, starch, and vegetables.  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   Exercise: Patient golfs several times a week and does yoga at home 2x week. Patient enjoys yard work and participates in [employer] at the [employer]: Patient reports wearing seat belt when in vehicle.   Paul [name] Exposure/Protection: Patient reports wearing sun screen intermittently.    Hobbies: golfing, teaching at [employer]      Social Determinants of Health   Financial Resource Strain: Low Risk  (12/13/2020)   Overall Financial Resource Strain (CARDIA)    Difficulty of Paying Living Expenses: Not hard at all  Food Insecurity: No Food Insecurity (12/03/2021)   Hunger Vital Sign    Worried About Running Out of Food in the Last Year: Never true    Ran Out of Food in the Last Year: Never true  Transportation Needs: No Transportation Needs (12/13/2020)   PRAPARE - Administrator, Civil Service (Medical): No    Lack of Transportation (Non-Medical): No  Physical Activity: Insufficiently Active (  12/13/2020)   Exercise Vital Sign    Days of Exercise per Week: 3 days    Minutes of Exercise per Session: 20 min  Stress: No Stress Concern Present (12/13/2020)   [employer] of Occupational Health - Occupational Stress Questionnaire    Feeling of Stress : Not at all  Social Connections: Socially Integrated (12/13/2020)   Social Connection and Isolation Panel [NHANES]    Frequency of Communication with Friends and Family: More than three times a week    Frequency of Social Gatherings with Friends and Family: More than three times a week    Attends Religious Services: More than 4 times per year    Active Member of [employer] or Organizations: Yes    Attends Engineer, structural: More than 4 times per year    Marital Status: Living with partner   Allergies  Allergen Reactions   Ace Inhibitors Swelling    After  taking for long time had (facial) angioedema on lisinopril   Family History  Problem Relation Age of Onset   Diabetes Mother    Kidney disease Father    Cancer Sister        ? pancreatic   Cancer Sister        ? pancreatic   Stroke Brother    Heart disease Brother    Colon cancer Neg Hx    Stomach cancer Neg Hx    Rectal cancer Neg Hx    Esophageal cancer Neg Hx    Liver cancer Neg Hx      Current Outpatient Medications (Cardiovascular):    cloNIDine (CATAPRES - DOSED IN MG/24 HR) 0.3 mg/24hr patch, APPLY 1 PATCH TOPICALLY TO THE SKIN EVERY WEEK   diltiazem 2 % GEL*, Apply 1 Application topically 2 (two) times daily. Using your index finger, apply a small amount of medication inside the rectum up to your first knuckle/joint twice daily x 4 weeks.   felodipine (PLENDIL) 10 MG 24 hr tablet, TAKE 1 TABLET(10 MG) BY MOUTH DAILY   hydrochlorothiazide (HYDRODIURIL) 25 MG tablet, TAKE 1 TABLET BY MOUTH DAILY   pravastatin (PRAVACHOL) 40 MG tablet, TAKE 1 TABLET(40 MG) BY MOUTH DAILY   sildenafil (REVATIO) 20 MG tablet, TAKE ONE TABLET BY MOUTH ONE TIME DAILY AS DIRECTED FOR ERECTILE DYSFUNCTION  Current Outpatient Medications (Respiratory):    fluticasone (FLONASE) 50 MCG/ACT nasal spray, Place 2 sprays into both nostrils daily.   loratadine (CLARITIN) 10 MG tablet, Take 1 tablet (10 mg total) by mouth daily.  Current Outpatient Medications (Analgesics):    allopurinol (ZYLOPRIM) 100 MG tablet, TAKE 2 TABLETS(200 MG) BY MOUTH DAILY   aspirin 81 MG chewable tablet, Chew 81 mg by mouth daily.  Current Outpatient Medications (Hematological):    Cyanocobalamin (VITAMIN B-12 PO), Take 1 capsule by mouth daily.  Current Outpatient Medications (Other):    Cholecalciferol (VITAMIN D3 PO), Take 4,000 Units by mouth daily.   diclofenac sodium (VOLTAREN) 1 % GEL, Apply 2 g topically 4 (four) times daily.   diltiazem 2 % GEL*, Apply 1 Application topically 2 (two) times daily. Using your index  finger, apply a small amount of medication inside the rectum up to your first knuckle/joint twice daily x 4 weeks.   mirabegron ER (MYRBETRIQ) 25 MG TB24 tablet, Take 1 tablet by mouth daily.   Multiple Vitamins-Minerals (ICAPS AREDS 2 PO), Take 1 capsule by mouth 2 (two) times a day.   pantoprazole (PROTONIX) 40 MG tablet, TAKE 1 TABLET(40 MG) BY MOUTH TWICE  DAILY BEFORE A MEAL   polyethylene glycol powder (GLYCOLAX/MIRALAX) 17 GM/SCOOP powder, Take one or two scoops by mouth daily as directed   Pyridoxine HCl (VITAMIN B6 PO), Take 1 capsule by mouth daily.   Zoster Vaccine Adjuvanted [facility]) injection, Inject 0.5 ml for first dose and then repeat once per immunization schedule as directed for second final dose * These medications belong to multiple therapeutic classes and are listed under each applicable group.   Reviewed prior external information including notes and imaging from  primary care provider As well as notes that were available from care everywhere and other healthcare systems.  Past medical history, social, surgical and family history all reviewed in electronic medical record.  No pertanent information unless stated regarding to the chief complaint.   Review of Systems:  No headache, visual changes, nausea, vomiting, diarrhea, constipation, dizziness, abdominal pain, skin rash, fevers, chills, night sweats, weight loss, swollen lymph nodes, body aches, joint swelling, chest pain, shortness of breath, mood changes. POSITIVE muscle aches  Objective  Blood pressure 124/86, pulse 86, height  (1.727 m), weight 181 lb (82.1 kg), SpO2 96 %.   General: No apparent distress alert and oriented x3 mood and affect normal, dressed appropriately.  HEENT: Pupils equal, extraocular movements intact  Respiratory: Patient's speak in full sentences and does not appear short of breath  Cardiovascular: No lower extremity edema, non tender, no erythema  No bruising but mid aspect of the  back does not seem to engage with resisted adduction.  No worsening pain with this.  This is different than the contralateral side.  Still having difficulty with the left shoulder though noted.  Limited muscular skeletal ultrasound was performed and interpreted by Antoine Primas, M  Limited ultrasound of patient's right pectoral muscle does show an area with hypoechoic changes that is consistent with a partial muscle tear.  No involvement of any of the tendon more distally.  Patient does have a cortical irregularity noted deep to 8 of the rib.  No abnormal neovascularization. Impression: Pectoralis muscle injury    Impression and Recommendations:

## 2022-08-12 NOTE — Assessment & Plan Note (Signed)
Patient on ultrasound does have some hypoechoic changes that do not appear to be more of a muscle strain versus small partial tear.  No tendon involvement noted..  Will get x-ray to make sure there is no underlying arthritic changes or avulsion of the rib that could be contributing as well.  Discussed which activities to do and which ones to avoid.  Follow-up again in 6 to 8 weeks otherwise.

## 2022-08-16 ENCOUNTER — Ambulatory Visit
Admission: RE | Admit: 2022-08-16 | Discharge: 2022-08-16 | Disposition: A | Discharge status: Home / Self Care | Payer: Medicare PPO | Source: Ambulatory Visit | Attending: Family Medicine | Admitting: Family Medicine

## 2022-08-16 DIAGNOSIS — S46012A Strain of muscle(s) and tendon(s) of the rotator cuff of left shoulder, initial encounter: Secondary | ICD-10-CM | POA: Diagnosis not present

## 2022-08-16 DIAGNOSIS — G8929 Other chronic pain: Secondary | ICD-10-CM

## 2022-08-20 ENCOUNTER — Ambulatory Visit (INDEPENDENT_AMBULATORY_CARE_PROVIDER_SITE_OTHER): Payer: Medicare PPO | Admitting: Family Medicine

## 2022-08-20 ENCOUNTER — Encounter: Payer: Self-pay | Admitting: Family Medicine

## 2022-08-20 ENCOUNTER — Other Ambulatory Visit: Payer: Self-pay

## 2022-08-20 VITALS — BP 124/70 | HR 76 | Ht 68.0 in | Wt 183.2 lb

## 2022-08-20 DIAGNOSIS — M7582 Other shoulder lesions, left shoulder: Secondary | ICD-10-CM

## 2022-08-20 DIAGNOSIS — N1832 Chronic kidney disease, stage 3b: Secondary | ICD-10-CM | POA: Diagnosis not present

## 2022-08-20 DIAGNOSIS — M25512 Pain in left shoulder: Secondary | ICD-10-CM | POA: Diagnosis not present

## 2022-08-20 DIAGNOSIS — Z0000 Encounter for general adult medical examination without abnormal findings: Secondary | ICD-10-CM

## 2022-08-20 MED ORDER — TETANUS-DIPHTH-ACELL PERTUSSIS 5-2.5-18.5 LF-MCG/0.5 IM SUSP: 0.5000 mL | Freq: Once | INTRAMUSCULAR | 0 refills | Status: AC

## 2022-08-20 NOTE — Patient Instructions (Addendum)
I am rechecking the labs the kidney doctor requested and those will be available in [place]. I will call you if anything is weird.   I am send a Rx for your tetanus shot to your pharmacy

## 2022-08-20 NOTE — Progress Notes (Signed)
    CHIEF COMPLAINT / HPI:   Here for well checkup.  Recently had a fall when he was walking his dog.  He was seen by sports medicine physician and has a large hematoma on his thigh.  He is gradually improving from that.  Otherwise doing pretty well.  Still working out but not as vigorously as he was there for a while and this frustrates him a little bit. #2.  Left shoulder pain.  He had an MRI that showed rotator cuff tear with some retraction.  He actually has pretty good use of the shoulder and wonders if he should have an evaluation by orthopedics.  PERTINENT  PMH / PSH: I have reviewed the patient's medications, allergies, past medical and surgical history, smoking status and updated in the EMR as appropriate.   OBJECTIVE:  BP 124/70   Pulse 76   Ht 5\' 8"  (1.727 m)   Wt 183 lb 3.2 oz (83.1 kg)   SpO2 98%   BMI 27.86 kg/m  Vital signs reviewed. GENERAL: Well-developed, well-nourished, no acute distress. CARDIOVASCULAR: Regular rate and rhythm no murmur gallop or rub LUNGS: Clear to auscultation bilaterally, no rales or wheeze. ABDOMEN: Soft positive bowel sounds NEURO: No gross focal neurological deficits. MSK: Movement of extremity x 4.   ASSESSMENT / PLAN:   Healthcare maintenance Reviewed health maintenance and he is up-to-date.  Refilled some meds.  Will check labs today. #2.  Rotator cuff tear on the left.  Really has good function little pain however he would like to see orthopedics so we will refer him. Denny Levy MD

## 2022-08-21 ENCOUNTER — Encounter: Payer: Self-pay | Admitting: Family Medicine

## 2022-08-21 LAB — PHOSPHORUS: Phosphorus: 3.2 mg/dL (ref 2.8–4.1)

## 2022-08-21 LAB — PTH, INTACT AND CALCIUM
Calcium: 9.7 mg/dL (ref 8.6–10.2)
PTH: 18 pg/mL (ref 15–65)

## 2022-08-22 ENCOUNTER — Other Ambulatory Visit: Payer: Medicare PPO

## 2022-08-22 NOTE — Assessment & Plan Note (Signed)
Reviewed health maintenance and he is up-to-date.  Refilled some meds.  Will check labs today.

## 2022-08-29 DIAGNOSIS — S46012A Strain of muscle(s) and tendon(s) of the rotator cuff of left shoulder, initial encounter: Secondary | ICD-10-CM | POA: Diagnosis not present

## 2022-09-11 ENCOUNTER — Other Ambulatory Visit: Payer: Self-pay | Admitting: Family Medicine

## 2022-09-11 ENCOUNTER — Other Ambulatory Visit: Payer: Self-pay | Admitting: Internal Medicine

## 2022-09-18 NOTE — Progress Notes (Signed)
[name] [address] [place] [address] Phone: [phone] Subjective:   Paul [name], am serving as a scribe for Dr. Antoine Primas.  I'm seeing this patient by the request  of:  [name], MD  CC:  Back and neck pain follow up  [id number]  Django Anthis is a 88 y.o. male coming in with complaint of back and neck pain. OMT 08/12/2022. Also f/u for R shoulder and pec muscle pain. Patient states that he got an injection at the ortho and is doing well.   Medications patient has been prescribed: None       Reviewed prior external information including notes and imaging from previsou exam, outside providers and external EMR if available.   As well as notes that were available from care everywhere and other healthcare systems.  Past medical history, social, surgical and family history all reviewed in electronic medical record.  No pertanent information unless stated regarding to the chief complaint.   Past Medical History:  Diagnosis Date   Enlarged prostate    GERD (gastroesophageal reflux disease)    Heart murmur    Hiatal hernia    History of cataract    Hypertension    Spinal stenosis     Allergies  Allergen Reactions   Ace Inhibitors Swelling    After taking for long time had (facial) angioedema on lisinopril     Review of Systems:  No headache, visual changes, nausea, vomiting, diarrhea, constipation, dizziness, abdominal pain, skin rash, fevers, chills, night sweats, weight loss, swollen lymph nodes, body aches, joint swelling, chest pain, shortness of breath, mood changes. POSITIVE muscle aches  Objective  Blood pressure 114/76, pulse 60, height 5\' 8"  (1.727 m), weight 177 lb (80.3 kg), SpO2 98 %.   General: No apparent distress alert and oriented x3 mood and affect normal, dressed appropriately.  HEENT: Pupils equal, extraocular movements intact  Respiratory: Patient's speak in full sentences and does not appear  short of breath  Cardiovascular: No lower extremity edema, non tender, no erythema  Low back does have significant loss of lordosis noted.  Some tenderness to palpation in the paraspinal musculature.  Tightness noted with Pearlean Brownie right greater than left.  Osteopathic findings  C2 flexed rotated and side bent right C7 flexed rotated and side bent left T3 extended rotated and side bent right inhaled rib T7 extended rotated and side bent left L5 flexed rotated and side bent right Sacrum right on right     Assessment and Plan:  Spinal stenosis of lumbar region with radiculopathy Has been doing relatively well.  Patient recently did have some weight loss but did have a stomach virus.  Discussed with patient to monitor.  If worsening weight loss will consider laboratory workup.  Follow-up with me again in 6 to 8 weeks    Nonallopathic problems  Decision today to treat with OMT was based on Physical Exam  After verbal consent patient was treated with HVLA, ME, FPR techniques in cervical, rib, thoracic, lumbar, and sacral  areas  Patient tolerated the procedure well with improvement in symptoms  Patient given exercises, stretches and lifestyle modifications  See medications in patient instructions if given  Patient will follow up in 4-8 weeks    The above documentation has been reviewed and is accurate and complete [name], DO          Note: This dictation was prepared with Dragon dictation along with smaller phrase technology. Any  transcriptional errors that result from this process are unintentional.

## 2022-09-23 ENCOUNTER — Ambulatory Visit: Payer: Medicare PPO | Admitting: Family Medicine

## 2022-09-23 ENCOUNTER — Encounter: Payer: Self-pay | Admitting: Family Medicine

## 2022-09-23 VITALS — BP 114/76 | HR 60 | Ht 68.0 in | Wt 177.0 lb

## 2022-09-23 DIAGNOSIS — M5416 Radiculopathy, lumbar region: Secondary | ICD-10-CM | POA: Diagnosis not present

## 2022-09-23 DIAGNOSIS — M9902 Segmental and somatic dysfunction of thoracic region: Secondary | ICD-10-CM | POA: Diagnosis not present

## 2022-09-23 DIAGNOSIS — M48061 Spinal stenosis, lumbar region without neurogenic claudication: Secondary | ICD-10-CM | POA: Diagnosis not present

## 2022-09-23 DIAGNOSIS — M9904 Segmental and somatic dysfunction of sacral region: Secondary | ICD-10-CM | POA: Diagnosis not present

## 2022-09-23 DIAGNOSIS — M9901 Segmental and somatic dysfunction of cervical region: Secondary | ICD-10-CM | POA: Diagnosis not present

## 2022-09-23 DIAGNOSIS — M9903 Segmental and somatic dysfunction of lumbar region: Secondary | ICD-10-CM | POA: Diagnosis not present

## 2022-09-23 DIAGNOSIS — M9908 Segmental and somatic dysfunction of rib cage: Secondary | ICD-10-CM | POA: Diagnosis not present

## 2022-09-23 NOTE — Patient Instructions (Signed)
Beat up on the youngling See you again after mountain trip early july

## 2022-09-23 NOTE — Assessment & Plan Note (Signed)
Has been doing relatively well.  Patient recently did have some weight loss but did have a stomach virus.  Discussed with patient to monitor.  If worsening weight loss will consider laboratory workup.  Follow-up with me again in 6 to 8 weeks

## 2022-09-26 DIAGNOSIS — S46012D Strain of muscle(s) and tendon(s) of the rotator cuff of left shoulder, subsequent encounter: Secondary | ICD-10-CM | POA: Diagnosis not present

## 2022-10-07 DIAGNOSIS — J309 Allergic rhinitis, unspecified: Secondary | ICD-10-CM | POA: Diagnosis not present

## 2022-10-07 DIAGNOSIS — N529 Male erectile dysfunction, unspecified: Secondary | ICD-10-CM | POA: Diagnosis not present

## 2022-10-07 DIAGNOSIS — K5900 Constipation, unspecified: Secondary | ICD-10-CM | POA: Diagnosis not present

## 2022-10-07 DIAGNOSIS — N3941 Urge incontinence: Secondary | ICD-10-CM | POA: Diagnosis not present

## 2022-10-07 DIAGNOSIS — K219 Gastro-esophageal reflux disease without esophagitis: Secondary | ICD-10-CM | POA: Diagnosis not present

## 2022-10-07 DIAGNOSIS — I129 Hypertensive chronic kidney disease with stage 1 through stage 4 chronic kidney disease, or unspecified chronic kidney disease: Secondary | ICD-10-CM | POA: Diagnosis not present

## 2022-10-07 DIAGNOSIS — I951 Orthostatic hypotension: Secondary | ICD-10-CM | POA: Diagnosis not present

## 2022-10-07 DIAGNOSIS — E785 Hyperlipidemia, unspecified: Secondary | ICD-10-CM | POA: Diagnosis not present

## 2022-10-07 DIAGNOSIS — M1990 Unspecified osteoarthritis, unspecified site: Secondary | ICD-10-CM | POA: Diagnosis not present

## 2022-10-13 ENCOUNTER — Other Ambulatory Visit: Payer: Self-pay | Admitting: Family Medicine

## 2022-10-27 NOTE — Progress Notes (Unsigned)
[name] [address] [place] [address] Phone: [phone] Subjective:   Paul [name], am serving as a scribe for Dr. Antoine Primas.  I'm seeing this patient by the request  of:  [name], MD  CC: Back and neck pain follow-up  [id number]  Paul [name] is a 88 y.o. male coming in with complaint of back and neck pain. OMT 09/23/2022. Patient states that he is the same as last visit. Shoulder has been feeling good but he wants to know if this will change at some point.   Medications patient has been prescribed: None  Taking:         Reviewed prior external information including notes and imaging from previsou exam, outside providers and external EMR if available.   As well as notes that were available from care everywhere and other healthcare systems.  Past medical history, social, surgical and family history all reviewed in electronic medical record.  No pertanent information unless stated regarding to the chief complaint.   Past Medical History:  Diagnosis Date   Enlarged prostate    GERD (gastroesophageal reflux disease)    Heart murmur    Hiatal hernia    History of cataract    Hypertension    Spinal stenosis     Allergies  Allergen Reactions   Ace Inhibitors Swelling    After taking for long time had (facial) angioedema on lisinopril     Review of Systems:  No headache, visual changes, nausea, vomiting, diarrhea, constipation, dizziness, abdominal pain, skin rash, fevers, chills, night sweats, weight loss, swollen lymph nodes, body aches, joint swelling, chest pain, shortness of breath, mood changes. POSITIVE muscle aches  Objective  Blood pressure 124/78, pulse 74, height 5\' 8"  (1.727 m), weight 179 lb (81.2 kg), SpO2 98 %.   General: No apparent distress alert and oriented x3 mood and affect normal, dressed appropriately.  HEENT: Pupils equal, extraocular movements intact  Respiratory: Patient's speak in  full sentences and does not appear short of breath  Cardiovascular: No lower extremity edema, non tender, no erythema  Low back does have loss of lordosis  Tightness noted in the lumbar spine  Tightness with faber    Osteopathic findings C7 flexed rotated and side bent left T3 extended rotated and side bent right inhaled rib T8 extended rotated and side bent left L2 flexed rotated and side bent right Sacrum right on right       Assessment and Plan:  Spinal stenosis of lumbar region with radiculopathy Patient does have known severe spinal stenosis.  Does respond extremely well though to osteopathic manipulation.  Will always have the epidurals if necessary.  Discussed which activities to do and which ones to avoid.  Follow-up again in 6 to 8 weeks.    Nonallopathic problems  Decision today to treat with OMT was based on Physical Exam  After verbal consent patient was treated with HVLA, ME, FPR techniques in cervical, rib, thoracic, lumbar, and sacral  areas  Patient tolerated the procedure well with improvement in symptoms  Patient given exercises, stretches and lifestyle modifications  See medications in patient instructions if given  Patient will follow up in 4-8 weeks     The above documentation has been reviewed and is accurate and complete [name], DO         Note: This dictation was prepared with Dragon dictation along with smaller phrase technology. Any transcriptional errors that result from this process are  unintentional.

## 2022-10-28 ENCOUNTER — Ambulatory Visit: Payer: Medicare PPO | Admitting: Family Medicine

## 2022-10-28 ENCOUNTER — Encounter: Payer: Self-pay | Admitting: Family Medicine

## 2022-10-28 VITALS — BP 124/78 | HR 74 | Ht 68.0 in | Wt 179.0 lb

## 2022-10-28 DIAGNOSIS — M48061 Spinal stenosis, lumbar region without neurogenic claudication: Secondary | ICD-10-CM | POA: Diagnosis not present

## 2022-10-28 DIAGNOSIS — M9904 Segmental and somatic dysfunction of sacral region: Secondary | ICD-10-CM

## 2022-10-28 DIAGNOSIS — M9903 Segmental and somatic dysfunction of lumbar region: Secondary | ICD-10-CM | POA: Diagnosis not present

## 2022-10-28 DIAGNOSIS — M9901 Segmental and somatic dysfunction of cervical region: Secondary | ICD-10-CM

## 2022-10-28 DIAGNOSIS — M9908 Segmental and somatic dysfunction of rib cage: Secondary | ICD-10-CM | POA: Diagnosis not present

## 2022-10-28 DIAGNOSIS — M5416 Radiculopathy, lumbar region: Secondary | ICD-10-CM

## 2022-10-28 DIAGNOSIS — M9902 Segmental and somatic dysfunction of thoracic region: Secondary | ICD-10-CM | POA: Diagnosis not present

## 2022-10-28 NOTE — Assessment & Plan Note (Signed)
Patient does have known severe spinal stenosis.  Does respond extremely well though to osteopathic manipulation.  Will always have the epidurals if necessary.  Discussed which activities to do and which ones to avoid.  Follow-up again in 6 to 8 weeks.

## 2022-10-28 NOTE — Patient Instructions (Signed)
Great to see you No mercy on younger players See me in 6-8 weeks

## 2022-11-04 DIAGNOSIS — H353131 Nonexudative age-related macular degeneration, bilateral, early dry stage: Secondary | ICD-10-CM | POA: Diagnosis not present

## 2022-11-04 DIAGNOSIS — H35722 Serous detachment of retinal pigment epithelium, left eye: Secondary | ICD-10-CM | POA: Diagnosis not present

## 2022-11-04 DIAGNOSIS — H43812 Vitreous degeneration, left eye: Secondary | ICD-10-CM | POA: Diagnosis not present

## 2022-11-04 DIAGNOSIS — H35363 Drusen (degenerative) of macula, bilateral: Secondary | ICD-10-CM | POA: Diagnosis not present

## 2022-11-04 DIAGNOSIS — H35033 Hypertensive retinopathy, bilateral: Secondary | ICD-10-CM | POA: Diagnosis not present

## 2022-11-04 DIAGNOSIS — Z961 Presence of intraocular lens: Secondary | ICD-10-CM | POA: Diagnosis not present

## 2022-11-04 DIAGNOSIS — H35453 Secondary pigmentary degeneration, bilateral: Secondary | ICD-10-CM | POA: Diagnosis not present

## 2022-11-21 ENCOUNTER — Other Ambulatory Visit: Payer: Self-pay | Admitting: Family Medicine

## 2022-11-27 DIAGNOSIS — N401 Enlarged prostate with lower urinary tract symptoms: Secondary | ICD-10-CM | POA: Diagnosis not present

## 2022-11-27 DIAGNOSIS — N138 Other obstructive and reflux uropathy: Secondary | ICD-10-CM | POA: Diagnosis not present

## 2022-12-08 DIAGNOSIS — Z961 Presence of intraocular lens: Secondary | ICD-10-CM | POA: Diagnosis not present

## 2022-12-08 DIAGNOSIS — H04123 Dry eye syndrome of bilateral lacrimal glands: Secondary | ICD-10-CM | POA: Diagnosis not present

## 2022-12-08 DIAGNOSIS — H35033 Hypertensive retinopathy, bilateral: Secondary | ICD-10-CM | POA: Diagnosis not present

## 2022-12-08 DIAGNOSIS — H43812 Vitreous degeneration, left eye: Secondary | ICD-10-CM | POA: Diagnosis not present

## 2022-12-08 DIAGNOSIS — H35723 Serous detachment of retinal pigment epithelium, bilateral: Secondary | ICD-10-CM | POA: Diagnosis not present

## 2022-12-08 DIAGNOSIS — H35363 Drusen (degenerative) of macula, bilateral: Secondary | ICD-10-CM | POA: Diagnosis not present

## 2022-12-08 DIAGNOSIS — H353131 Nonexudative age-related macular degeneration, bilateral, early dry stage: Secondary | ICD-10-CM | POA: Diagnosis not present

## 2022-12-08 DIAGNOSIS — H53142 Visual discomfort, left eye: Secondary | ICD-10-CM | POA: Diagnosis not present

## 2022-12-10 ENCOUNTER — Other Ambulatory Visit: Payer: Self-pay | Admitting: Family Medicine

## 2022-12-17 NOTE — Progress Notes (Deleted)
  [name] [address] [place] [address] Phone: [phone] Subjective:    I'm seeing this patient by the request  of:  [name], MD  CC:   [id number]  Paul [name] is a 88 y.o. male coming in with complaint of back and neck pain. OMT 10/28/2022. Patient states   Medications patient has been prescribed: None  Taking:         Reviewed prior external information including notes and imaging from previsou exam, outside providers and external EMR if available.   As well as notes that were available from care everywhere and other healthcare systems.  Past medical history, social, surgical and family history all reviewed in electronic medical record.  No pertanent information unless stated regarding to the chief complaint.   Past Medical History:  Diagnosis Date   Enlarged prostate    GERD (gastroesophageal reflux disease)    Heart murmur    Hiatal hernia    History of cataract    Hypertension    Spinal stenosis     Allergies  Allergen Reactions   Ace Inhibitors Swelling    After taking for long time had (facial) angioedema on lisinopril     Review of Systems:  No headache, visual changes, nausea, vomiting, diarrhea, constipation, dizziness, abdominal pain, skin rash, fevers, chills, night sweats, weight loss, swollen lymph nodes, body aches, joint swelling, chest pain, shortness of breath, mood changes. POSITIVE muscle aches  Objective  There were no vitals taken for this visit.   General: No apparent distress alert and oriented x3 mood and affect normal, dressed appropriately.  HEENT: Pupils equal, extraocular movements intact  Respiratory: Patient's speak in full sentences and does not appear short of breath  Cardiovascular: No lower extremity edema, non tender, no erythema  Gait MSK:  Back   Osteopathic findings  C2 flexed rotated and side bent right C6 flexed rotated and side bent left T3 extended rotated and  side bent right inhaled rib T9 extended rotated and side bent left L2 flexed rotated and side bent right Sacrum right on right       Assessment and Plan:  No problem-specific Assessment & Plan notes found for this encounter.    Nonallopathic problems  Decision today to treat with OMT was based on Physical Exam  After verbal consent patient was treated with HVLA, ME, FPR techniques in cervical, rib, thoracic, lumbar, and sacral  areas  Patient tolerated the procedure well with improvement in symptoms  Patient given exercises, stretches and lifestyle modifications  See medications in patient instructions if given  Patient will follow up in 4-8 weeks             Note: This dictation was prepared with Dragon dictation along with smaller phrase technology. Any transcriptional errors that result from this process are unintentional.

## 2022-12-18 ENCOUNTER — Ambulatory Visit: Payer: Medicare PPO | Admitting: Family Medicine

## 2022-12-25 NOTE — Progress Notes (Signed)
[name] [address] [place] [address] Phone: [phone] Subjective:   Paul [name], am serving as a scribe for Dr. Antoine [name].  I'm seeing this patient by the request  of:  Paul Ramp, MD  CC: Back and neck pain follow-up  [id number]  Paul [name] is a 88 y.o. male coming in with complaint of back and neck pain. OMT on 10/28/2022. Patient states that he is the same as last visit.   Medications patient has been prescribed:   Taking:         Reviewed prior external information including notes and imaging from previsou exam, outside providers and external EMR if available.   As well as notes that were available from care everywhere and other healthcare systems.  Past medical history, social, surgical and family history all reviewed in electronic medical record.  No pertanent information unless stated regarding to the chief complaint.   Past Medical History:  Diagnosis Date   Enlarged prostate    GERD (gastroesophageal reflux disease)    Heart murmur    Hiatal hernia    History of cataract    Hypertension    Spinal stenosis     Allergies  Allergen Reactions   Ace Inhibitors Swelling    After taking for long time had (facial) angioedema on lisinopril     Review of Systems:  No headache, visual changes, nausea, vomiting, diarrhea, constipation, dizziness, abdominal pain, skin rash, fevers, chills, night sweats, weight loss, swollen lymph nodes, body aches, joint swelling, chest pain, shortness of breath, mood changes. POSITIVE muscle aches  Objective  Blood pressure 104/76, pulse 64, height 5\' 8"  (1.727 m), weight 181 lb (82.1 kg), SpO2 96%.   General: No apparent distress alert and oriented x3 mood and affect normal, dressed appropriately.  HEENT: Pupils equal, extraocular movements intact  Respiratory: Patient's speak in full sentences and does not appear short of breath  Cardiovascular: No lower extremity  edema, non tender, no erythema  Gait abnormal loss of lordosis  MSK:  Back loss of lordosis noted as well. Tightness with SLT  Left shoulder ac joint positive crossover.   Osteopathic findings  C3 flexed rotated and side bent right C7 flexed rotated and side bent left T3 extended rotated and side bent right inhaled rib T8 extended rotated and side bent left L1 flexed rotated and side bent right Sacrum right on right  After verbal send patient was prepped with alcohol swab and with a 25-gauge half inch needle injected into the left acromioclavicular joint with 0.5 cc 0.5% Marcaine and 0.5 cc of Kenalog 40 mg/mL.  No blood loss.  Band-Aid placed.  Postinjection instructions given    Assessment and Plan:  Spinal stenosis of lumbar region with radiculopathy Discussed hep  Discussed which activities to do  Discussed maybe consider another epidural respond to OMT  Rtc in 6 weeks   AC (acromioclavicular) arthritis Chronic, with exacerbation.  Discussed icing regimen and home exercises. Discussed with patient about avoiding certain activities.  Increase activity as tolerated.  Follow-up again in 6 to 8 weeks.    Nonallopathic problems  Decision today to treat with OMT was based on Physical Exam  After verbal consent patient was treated with HVLA, ME, FPR techniques in cervical, rib, thoracic, lumbar, and sacral  areas  Patient tolerated the procedure well with improvement in symptoms  Patient given exercises, stretches and lifestyle modifications  See medications in patient instructions if given  Patient will  follow up in 4-8 weeks     The above documentation has been reviewed and is accurate and complete Paul Saa, DO       Note: This dictation was prepared with Dragon dictation along with smaller phrase technology. Any transcriptional errors that result from this process are unintentional.

## 2022-12-31 ENCOUNTER — Ambulatory Visit: Payer: Medicare PPO | Admitting: Family Medicine

## 2022-12-31 ENCOUNTER — Encounter: Payer: Self-pay | Admitting: Family Medicine

## 2022-12-31 VITALS — BP 104/76 | HR 64 | Ht 68.0 in | Wt 181.0 lb

## 2022-12-31 DIAGNOSIS — M9903 Segmental and somatic dysfunction of lumbar region: Secondary | ICD-10-CM

## 2022-12-31 DIAGNOSIS — M9902 Segmental and somatic dysfunction of thoracic region: Secondary | ICD-10-CM

## 2022-12-31 DIAGNOSIS — M19012 Primary osteoarthritis, left shoulder: Secondary | ICD-10-CM

## 2022-12-31 DIAGNOSIS — M5416 Radiculopathy, lumbar region: Secondary | ICD-10-CM

## 2022-12-31 DIAGNOSIS — M9901 Segmental and somatic dysfunction of cervical region: Secondary | ICD-10-CM | POA: Diagnosis not present

## 2022-12-31 DIAGNOSIS — M9908 Segmental and somatic dysfunction of rib cage: Secondary | ICD-10-CM | POA: Diagnosis not present

## 2022-12-31 DIAGNOSIS — M9904 Segmental and somatic dysfunction of sacral region: Secondary | ICD-10-CM | POA: Diagnosis not present

## 2022-12-31 DIAGNOSIS — M48061 Spinal stenosis, lumbar region without neurogenic claudication: Secondary | ICD-10-CM | POA: Diagnosis not present

## 2022-12-31 NOTE — Assessment & Plan Note (Signed)
Chronic, with exacerbation.  Discussed icing regimen and home exercises. Discussed with patient about avoiding certain activities.  Increase activity as tolerated.  Follow-up again in 6 to 8 weeks.

## 2022-12-31 NOTE — Patient Instructions (Addendum)
Epidural [phone] Injected L AC jt today See me again in 2 months

## 2022-12-31 NOTE — Assessment & Plan Note (Signed)
Discussed hep  Discussed which activities to do  Discussed maybe consider another epidural respond to OMT  Rtc in 6 weeks

## 2023-01-04 ENCOUNTER — Encounter: Payer: Self-pay | Admitting: Family Medicine

## 2023-01-15 ENCOUNTER — Ambulatory Visit: Payer: Medicare PPO | Admitting: Family Medicine

## 2023-01-21 ENCOUNTER — Other Ambulatory Visit: Payer: Self-pay | Admitting: Family Medicine

## 2023-02-17 ENCOUNTER — Ambulatory Visit: Payer: Medicare PPO | Admitting: Sports Medicine

## 2023-02-17 VITALS — BP 132/82 | Ht 67.0 in | Wt 181.0 lb

## 2023-02-17 DIAGNOSIS — S96911A Strain of unspecified muscle and tendon at ankle and foot level, right foot, initial encounter: Secondary | ICD-10-CM | POA: Diagnosis not present

## 2023-02-17 NOTE — Assessment & Plan Note (Signed)
We will add an arch strap for the next month Continue with icing if he has any pain I gave him scaphoid pads which we added to his Spenco insoles and into his walking shoes He felt like he got good support with both of these  I did advise him to continue using arch support over the long-term Reorder these pads as needed  Addendum I addressed a couple of the general issues as he seems to have a bit of a contact dermatitis over his shoulders which I think may come from the rubbing of his golf shirts I also suggested he continue with extension stretches for his back as he has had a lot of back problems over the years and the stretches are helping.  He was getting periodic epidurals but I told him if he is not having a lot of pain I do not see the value in that

## 2023-02-17 NOTE — Progress Notes (Signed)
Chief complaint right foot pain  Patient is an avid golfer A month or so ago he tried a new technique where he leans into the arch of his right foot as he is hitting the ball This gives him more power but puts more pressure on the inside of the foot He developed some pain anything swelling in that side of the foot and spite of using a Spenco arch support in his golf shoes This been present for about 2 weeks but over the past several days he has done a lot of icing and the pain is much better  Physical exam Pleasant older male in no acute distress BP 132/82   Ht 5\' 7"  (1.702 m)   Wt 181 lb (82.1 kg)   BMI 28.35 kg/m   Patient has total collapse with navicular drop of his right longitudinal arch On palpation he has pain over the plantar surface of the first tarsometatarsal joint I did not detect any swelling today No pain over the plantar fascia and no nodules noted  On the left foot he also has collapse of the arch but less so than on the right He has resting pronation bilaterally and less I provided him with arch support

## 2023-02-20 ENCOUNTER — Ambulatory Visit: Payer: Medicare PPO | Admitting: Nurse Practitioner

## 2023-03-03 NOTE — Progress Notes (Unsigned)
[name] [address] [place] [address] Phone: [phone] Subjective:   [name], am serving as a scribe for Dr. Antoine Primas.  I'm seeing this patient by the request  of:  [name], MD  CC: Low back and neck pain follow-up  [id number]  Paul [name] is a 88 y.o. male coming in with complaint of back and neck pain. OMT on 12/31/2022. Patient states that he is sore all over.   Medications patient has been prescribed:   Taking:         Reviewed prior external information including notes and imaging from previsou exam, outside providers and external EMR if available.   As well as notes that were available from care everywhere and other healthcare systems.  Past medical history, social, surgical and family history all reviewed in electronic medical record.  No pertanent information unless stated regarding to the chief complaint.   Past Medical History:  Diagnosis Date   Enlarged prostate    GERD (gastroesophageal reflux disease)    Heart murmur    Hiatal hernia    History of cataract    Hypertension    Spinal stenosis     Allergies  Allergen Reactions   Ace Inhibitors Swelling    After taking for long time had (facial) angioedema on lisinopril     Review of Systems:  No headache, visual changes, nausea, vomiting, diarrhea, constipation, dizziness, abdominal pain, skin rash, fevers, chills, night sweats, weight loss, swollen lymph nodes, body aches, joint swelling, chest pain, shortness of breath, mood changes. POSITIVE muscle aches  Objective  Blood pressure 138/82, pulse 72, height 5\' 7"  (1.702 m), weight 178 lb (80.7 kg), SpO2 98%.   General: No apparent distress alert and oriented x3 mood and affect normal, dressed appropriately.  HEENT: Pupils equal, extraocular movements intact  Respiratory: Patient's speak in full sentences and does not appear short of breath  Cardiovascular: No lower extremity edema, non  tender, no erythema  Back does have significant loss of lordosis and does have some degenerative scoliosis noted.  Significant tightness of the musculature.  Has only 5 degrees of extension of the back  Osteopathic findings  C2 flexed rotated and side bent right C6 flexed rotated and side bent left T3 extended rotated and side bent right inhaled rib T9 extended rotated and side bent left L1 flexed rotated and side bent left L2 flexed rotated and side bent right L3 flexed rotated and side bent right Sacrum right on right       Assessment and Plan:  Spinal stenosis of lumbar region with radiculopathy Patient likely is doing relatively well.  Discussed with him about lifting mechanics.  Patient is concerned with his posture and we discussed with him significantly as well about home exercises and ergonomics that he can make changes.  Has moved recently and into the new facility so hopefully will do relatively well.  Will continue to hopefully stay active where possible.  He is playing golf relatively regularly.  Follow-up with me again in 6 to 8 weeks    Nonallopathic problems  Decision today to treat with OMT was based on Physical Exam  After verbal consent patient was treated with HVLA, ME, FPR techniques in cervical, rib, thoracic, lumbar, and sacral  areas  Patient tolerated the procedure well with improvement in symptoms  Patient given exercises, stretches and lifestyle modifications  See medications in patient instructions if given  Patient will follow up in 4-8  weeks     The above documentation has been reviewed and is accurate and complete [name], DO         Note: This dictation was prepared with Dragon dictation along with smaller phrase technology. Any transcriptional errors that result from this process are unintentional.

## 2023-03-04 ENCOUNTER — Encounter: Payer: Self-pay | Admitting: Family Medicine

## 2023-03-04 ENCOUNTER — Ambulatory Visit: Payer: Medicare PPO | Admitting: Family Medicine

## 2023-03-04 VITALS — BP 138/82 | HR 72 | Ht 67.0 in | Wt 178.0 lb

## 2023-03-04 DIAGNOSIS — M9908 Segmental and somatic dysfunction of rib cage: Secondary | ICD-10-CM

## 2023-03-04 DIAGNOSIS — M9904 Segmental and somatic dysfunction of sacral region: Secondary | ICD-10-CM

## 2023-03-04 DIAGNOSIS — M9903 Segmental and somatic dysfunction of lumbar region: Secondary | ICD-10-CM | POA: Diagnosis not present

## 2023-03-04 DIAGNOSIS — M9902 Segmental and somatic dysfunction of thoracic region: Secondary | ICD-10-CM

## 2023-03-04 DIAGNOSIS — M9901 Segmental and somatic dysfunction of cervical region: Secondary | ICD-10-CM

## 2023-03-04 DIAGNOSIS — M5416 Radiculopathy, lumbar region: Secondary | ICD-10-CM

## 2023-03-04 DIAGNOSIS — M48061 Spinal stenosis, lumbar region without neurogenic claudication: Secondary | ICD-10-CM

## 2023-03-04 NOTE — Assessment & Plan Note (Signed)
Patient likely is doing relatively well.  Discussed with him about lifting mechanics.  Patient is concerned with his posture and we discussed with him significantly as well about home exercises and ergonomics that he can make changes.  Has moved recently and into the new facility so hopefully will do relatively well.  Will continue to hopefully stay active where possible.  He is playing golf relatively regularly.  Follow-up with me again in 6 to 8 weeks

## 2023-03-04 NOTE — Patient Instructions (Signed)
Good to see you Glad move has gone well See me again in 6-8 weeks

## 2023-03-10 ENCOUNTER — Other Ambulatory Visit: Payer: Self-pay | Admitting: Internal Medicine

## 2023-04-08 ENCOUNTER — Encounter: Payer: Self-pay | Admitting: Family Medicine

## 2023-04-08 ENCOUNTER — Ambulatory Visit (INDEPENDENT_AMBULATORY_CARE_PROVIDER_SITE_OTHER): Payer: Medicare PPO

## 2023-04-08 ENCOUNTER — Other Ambulatory Visit: Payer: Self-pay

## 2023-04-08 ENCOUNTER — Ambulatory Visit: Payer: Medicare PPO | Admitting: Family Medicine

## 2023-04-08 VITALS — BP 150/82 | HR 58 | Ht 67.0 in | Wt 182.0 lb

## 2023-04-08 DIAGNOSIS — M7731 Calcaneal spur, right foot: Secondary | ICD-10-CM | POA: Diagnosis not present

## 2023-04-08 DIAGNOSIS — M79671 Pain in right foot: Secondary | ICD-10-CM

## 2023-04-08 DIAGNOSIS — S99191A Other physeal fracture of right metatarsal, initial encounter for closed fracture: Secondary | ICD-10-CM

## 2023-04-08 DIAGNOSIS — S92351A Displaced fracture of fifth metatarsal bone, right foot, initial encounter for closed fracture: Secondary | ICD-10-CM | POA: Diagnosis not present

## 2023-04-08 NOTE — Progress Notes (Unsigned)
   I, Stevenson Clinch, CMA acting as a scribe for Clementeen Graham, MD.  Paul [name] is a 88 y.o. male who presents to [facility] today for R foot pain. Pt was previously seen by Dr. Katrinka Blazing on 03/04/23 for OMT.  Today, pt c/o R foot pain ongoing since earlier today. MOI: tripped over dog, did not fall. Pt locates pain to lateral aspect of the foot. Swelling present. Using ice immediately following injury. Has taken Tylenol. Hx of right foot injury 2 months ago, medial aspect. Some pain at rest, worse with ambulation.   R foot swelling: Aggravates: WB. ambulation Treatments tried:  Pertinent review of systems: No fevers or chills  Relevant historical information: Hypertension.  Hypothyroidism.  Chronic back pain.  Avid right-handed golfer.   Exam:  BP (!) 150/82   Pulse (!) 58   Ht 5\' 7"  (1.702 m)   Wt 182 lb (82.6 kg)   SpO2 99%   BMI 28.51 kg/m  General: Well Developed, well nourished, and in no acute distress.   MSK: Right foot swelling and tender to palpation proximal lateral midfoot along the fifth metatarsal. Normal foot and ankle motion. Pulses cap refill and sensation.    Lab and Radiology Results  X-ray images right foot obtained today personally and independently interpreted. Transverse fracture at the proximal fifth metatarsal avulsion type.  Minimal displacement.  Avulsion fragment is relatively large. Await formal radiology review    Assessment and Plan: 88 y.o. male with right fifth metatarsal fracture.  Patient has a dancers type or avulsion type fracture.  Plan to treat with immobilization with CAM Walker boot.  Check back as already scheduled with Dr. Katrinka Blazing on December 30.  He normally sees Dr. Katrinka Blazing.  Cautioned against driving while in the cam walker boot on his right foot.  Recommend against high-dose oral ibuprofen given hypertension and GERD.   PDMP not reviewed this encounter. Orders Placed This Encounter  Procedures   DG  Foot Complete Right    Standing Status:   Future    Number of Occurrences:   1    Expiration Date:   04/07/2024    Reason for Exam (SYMPTOM  OR DIAGNOSIS REQUIRED):   right foot pain / injury    Preferred imaging location?:   [facility]   No orders of the defined types were placed in this encounter.    Discussed warning signs or symptoms. Please see discharge instructions. Patient expresses understanding.   The above documentation has been reviewed and is accurate and complete Clementeen Graham, M.D.

## 2023-04-08 NOTE — Patient Instructions (Signed)
Thank you for coming in today.   Please go to [facility] supply to get the short cam walker boot we talked about today. You may also be able to get it from [employer].    Recheck in about 3 weeks (after Christmas).   Return sooner if needed.

## 2023-04-09 DIAGNOSIS — S99199A Other physeal fracture of unspecified metatarsal, initial encounter for closed fracture: Secondary | ICD-10-CM | POA: Insufficient documentation

## 2023-04-09 MED ORDER — TRAMADOL HCL 50 MG PO TABS: 50.0000 mg | ORAL_TABLET | Freq: Three times a day (TID) | ORAL | 0 refills | Status: AC | PRN

## 2023-04-09 NOTE — Telephone Encounter (Signed)
Forwarding to Dr. Denyse Amass to review and advise.   Per visit note:  Assessment and Plan: 88 y.o. male with right fifth metatarsal fracture.  Patient has a dancers type or avulsion type fracture.  Plan to treat with immobilization with CAM Walker boot.  Check back as already scheduled with Dr. Katrinka Blazing on December 30.  He normally sees Dr. Katrinka Blazing.  Cautioned against driving while in the cam walker boot on his right foot.  Recommend against high-dose oral ibuprofen given hypertension and GERD.

## 2023-04-13 NOTE — Progress Notes (Signed)
Right foot x-ray shows a fracture at the base of the fifth metatarsal.  This is exactly what we talked about when I showed you the picture in the office.

## 2023-04-24 NOTE — Progress Notes (Unsigned)
[name] [address] [place] [address] Phone: [phone] Subjective:   Paul [name], am serving as a scribe for Dr. Antoine Primas.  I'm seeing this patient by the request  of:  [name], MD  CC: back neck and foot pain follow up   [id number]  Paul [name] is a 88 y.o. male coming in with complaint of back and neck pain. OMT 03/04/2023. Saw Dr. Denyse Amass for f/x base of 5th. Patient states same per usual. Pain in foot is improving. Still wearing boot most of the time. No other concerns.        Xray showed acute mildly displaced fx of the 5th  NEED REPEAT XRAY  Reviewed prior external information including notes and imaging from previsou exam, outside providers and external EMR if available.   As well as notes that were available from care everywhere and other healthcare systems.  Past medical history, social, surgical and family history all reviewed in electronic medical record.  No pertanent information unless stated regarding to the chief complaint.   Past Medical History:  Diagnosis Date   Enlarged prostate    GERD (gastroesophageal reflux disease)    Heart murmur    Hiatal hernia    History of cataract    Hypertension    Spinal stenosis     Allergies  Allergen Reactions   Ace Inhibitors Swelling    After taking for long time had (facial) angioedema on lisinopril     Review of Systems:  No headache, visual changes, nausea, vomiting, diarrhea, constipation, dizziness, abdominal pain, skin rash, fevers, chills, night sweats, weight loss, swollen lymph nodes, body aches, joint swelling, chest pain, shortness of breath, mood changes. POSITIVE muscle aches  Objective  Blood pressure 122/84, pulse 77, height 5\' 7"  (1.702 m), SpO2 98%.   General: No apparent distress alert and oriented x3 mood and affect normal, dressed appropriately.  HEENT: Pupils equal, extraocular movements intact  Respiratory: Patient's speak in full  sentences and does not appear short of breath  Cardiovascular: No lower extremity edema, non tender, no erythema  Antalgic gait noted.  Patient does have tenderness to palpation over the fifth metatarsal proximally.  Patient otherwise has full range of motion of the ankle.  Patient has no pain in the foot with range of motion of the ankle.  Neurovascularly intact distally.  Does have trace edema noted of the lower extremity right greater than left.  Low back is tighter than usual.  Only 5 degrees of extension of the back.  Likely no radicular symptoms though noted today.  Sore to touch over the L4-S1 area.  Osteopathic findings  C6 flexed rotated and side bent left T3 extended rotated and side bent right inhaled rib T9 extended rotated and side bent left L1 flexed rotated and side bent right Sacrum right on right   Limited muscular skeletal ultrasound was performed and interpreted by Antoine Primas, M   Limited ultrasound of patient's right metatarsal shows the patient does have a minimally displaced proximal fifth metatarsal fracture that does seem to be intra-articular.  Unfortunately does seem to extend into the fourth metatarsal as well. Impression: Metatarsal fracture with minimal displacement and fourth metatarsal fracture also noted.    Assessment and Plan:  Closed displaced fracture of fifth metatarsal bone of right foot Appropriate care at moment.  Patient is to wear a carbon fiber plate over the cam walker.  Discussed only long weightbearing exercises or very low impact  exercises for now.  Increase activity slowly.  Follow-up again in 3 weeks otherwise    Nonallopathic problems  Decision today to treat with OMT was based on Physical Exam  After verbal consent patient was treated with HVLA, ME, FPR techniques in cervical, rib, thoracic, lumbar, and sacral  areas  Patient tolerated the procedure well with improvement in symptoms  Patient given exercises, stretches and  lifestyle modifications  See medications in patient instructions if given  Patient will follow up in 4-8 weeks     The above documentation has been reviewed and is accurate and complete [name], DO         Note: This dictation was prepared with Dragon dictation along with smaller phrase technology. Any transcriptional errors that result from this process are unintentional.

## 2023-04-27 ENCOUNTER — Encounter: Payer: Self-pay | Admitting: Family Medicine

## 2023-04-27 ENCOUNTER — Ambulatory Visit: Payer: Medicare PPO | Admitting: Family Medicine

## 2023-04-27 ENCOUNTER — Other Ambulatory Visit: Payer: Self-pay

## 2023-04-27 ENCOUNTER — Ambulatory Visit (INDEPENDENT_AMBULATORY_CARE_PROVIDER_SITE_OTHER): Payer: Medicare PPO

## 2023-04-27 VITALS — BP 122/84 | HR 77 | Ht 67.0 in

## 2023-04-27 DIAGNOSIS — S99191A Other physeal fracture of right metatarsal, initial encounter for closed fracture: Secondary | ICD-10-CM | POA: Diagnosis not present

## 2023-04-27 DIAGNOSIS — M9908 Segmental and somatic dysfunction of rib cage: Secondary | ICD-10-CM | POA: Diagnosis not present

## 2023-04-27 DIAGNOSIS — M9904 Segmental and somatic dysfunction of sacral region: Secondary | ICD-10-CM

## 2023-04-27 DIAGNOSIS — M79671 Pain in right foot: Secondary | ICD-10-CM

## 2023-04-27 DIAGNOSIS — M9903 Segmental and somatic dysfunction of lumbar region: Secondary | ICD-10-CM

## 2023-04-27 DIAGNOSIS — M9901 Segmental and somatic dysfunction of cervical region: Secondary | ICD-10-CM

## 2023-04-27 DIAGNOSIS — M7731 Calcaneal spur, right foot: Secondary | ICD-10-CM | POA: Diagnosis not present

## 2023-04-27 DIAGNOSIS — S92351A Displaced fracture of fifth metatarsal bone, right foot, initial encounter for closed fracture: Secondary | ICD-10-CM | POA: Insufficient documentation

## 2023-04-27 DIAGNOSIS — S92901D Unspecified fracture of right foot, subsequent encounter for fracture with routine healing: Secondary | ICD-10-CM | POA: Diagnosis not present

## 2023-04-27 DIAGNOSIS — M9902 Segmental and somatic dysfunction of thoracic region: Secondary | ICD-10-CM

## 2023-04-27 DIAGNOSIS — S92351D Displaced fracture of fifth metatarsal bone, right foot, subsequent encounter for fracture with routine healing: Secondary | ICD-10-CM | POA: Diagnosis not present

## 2023-04-27 DIAGNOSIS — M19071 Primary osteoarthritis, right ankle and foot: Secondary | ICD-10-CM | POA: Diagnosis not present

## 2023-04-27 NOTE — Patient Instructions (Signed)
Good to see you! See you again in 3 weeks The shoe or boot at all times Okay to do elliptical or biking Double up Vit D Tell dog he's lucky to be alive

## 2023-04-27 NOTE — Assessment & Plan Note (Signed)
Appropriate care at moment.  Patient is to wear a carbon fiber plate over the cam walker.  Discussed only long weightbearing exercises or very low impact exercises for now.  Increase activity slowly.  Follow-up again in 3 weeks otherwise

## 2023-05-05 ENCOUNTER — Other Ambulatory Visit: Payer: Self-pay | Admitting: Family Medicine

## 2023-05-05 DIAGNOSIS — L821 Other seborrheic keratosis: Secondary | ICD-10-CM | POA: Diagnosis not present

## 2023-05-05 DIAGNOSIS — L720 Epidermal cyst: Secondary | ICD-10-CM | POA: Diagnosis not present

## 2023-05-12 NOTE — Progress Notes (Signed)
[name] [address] [place] [address] Phone: [phone] Subjective:   Paul [name], am serving as a scribe for Dr. Antoine Primas.  I'm seeing this patient by the request  of:  [name], MD  CC: Foot pain, back pain neck pain  [id number]  04/27/2023 Appropriate care at moment.  Patient is to wear a carbon fiber plate over the cam walker.  Discussed only long weightbearing exercises or very low impact exercises for now.  Increase activity slowly.  Follow-up again in 3 weeks otherwise     Update 05/13/2023 Paul [name] is a 89 y.o. male coming in with complaint of R toe pain. Patient states that he is able to walk without pain for the majority of the time. Swelling as decreased. Does have pain intermittently but there is not pattern to his pain. Patient has been taking double of Vit D dosage.        Past Medical History:  Diagnosis Date   Enlarged prostate    GERD (gastroesophageal reflux disease)    Heart murmur    Hiatal hernia    History of cataract    Hypertension    Spinal stenosis    Past Surgical History:  Procedure Laterality Date   CATARACT EXTRACTION, BILATERAL     TOTAL KNEE ARTHROPLASTY     left   TRANSURETHRAL RESECTION OF PROSTATE  2011   Social History   Socioeconomic History   Marital status: Significant Other    Spouse name: Not on file   Number of children: 0   Years of education: 21 years   Highest education level: Doctorate  Occupational History   Occupation: retired    Associate Professor: [employer]    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   Drug use: No   Sexual activity: Yes  Other Topics Concern   Not on file  Social History Narrative   Health Care POA:    Emergency Contact:    End of Life Plan:    Who lives with you: Lives by himself in 1 story home.    Any pets: none   Diet: Patient has a varied diet of protein, starch, and vegetables.  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   Exercise: Patient golfs several times a week and does yoga at home 2x week. Patient enjoys yard work and participates in [place] at the [employer]: Patient reports wearing seat belt when in vehicle.   [name] Exposure/Protection: Patient reports wearing sun screen intermittently.    Hobbies: golfing, teaching at [employer]      Social Drivers of Health   Financial Resource Strain: Low Risk  (12/13/2020)   Overall Financial Resource Strain (CARDIA)    Difficulty of Paying Living Expenses: Not hard at all  Food Insecurity: Low Risk  (11/27/2022)   Received from [facility]   Hunger Vital Sign  Worried About Programme researcher, broadcasting/film/video in the Last Year: Never true    Ran Out of Food in the Last Year: Never true  Transportation Needs: No Transportation Needs (11/27/2022)   Received from [facility]    In the past 12 months, has lack of reliable transportation kept you from medical appointments, meetings, work or from getting things needed for daily living? : No  Physical Activity: Insufficiently Active (12/13/2020)   Exercise Vital Sign    Days of Exercise per Week: 3 days    Minutes of Exercise per Session: 20 min  Stress: No Stress Concern Present (12/13/2020)   [employer] of Occupational Health - Occupational Stress Questionnaire    Feeling of Stress : Not at all  Social Connections: Socially Integrated (12/13/2020)   Social Connection and Isolation Panel [NHANES]    Frequency of Communication with Friends  and Family: More than three times a week    Frequency of Social Gatherings with Friends and Family: More than three times a week    Attends Religious Services: More than 4 times per year    Active Member of [employer] or Organizations: Yes    Attends Engineer, structural: More than 4 times per year    Marital Status: Living with partner   Allergies  Allergen Reactions   Ace Inhibitors Swelling    After taking for long time had (facial) angioedema on lisinopril   Family History  Problem Relation Age of Onset   Diabetes Mother    Kidney disease Father    Cancer Sister        ? pancreatic   Cancer Sister        ? pancreatic   Stroke Brother    Heart disease Brother    Colon cancer Neg Hx    Stomach cancer Neg Hx    Rectal cancer Neg Hx    Esophageal cancer Neg Hx    Liver cancer Neg Hx      Current Outpatient Medications (Cardiovascular):    cloNIDine (CATAPRES - DOSED IN MG/24 HR) 0.3 mg/24hr patch, APPLY 1 PATCH TOPICALLY TO THE SKIN EVERY WEEK   diltiazem 2 % GEL*, Apply 1 Application topically 2 (two) times daily. Using your index finger, apply a small amount of medication inside the rectum up to your first knuckle/joint twice daily x 4 weeks.   felodipine (PLENDIL) 10 MG 24 hr tablet, TAKE 1 TABLET(10 MG) BY MOUTH DAILY   hydrochlorothiazide (HYDRODIURIL) 25 MG tablet, TAKE 1 TABLET BY MOUTH DAILY   pravastatin (PRAVACHOL) 40 MG tablet, TAKE 1 TABLET(40 MG) BY MOUTH DAILY   sildenafil (REVATIO) 20 MG tablet, TAKE ONE TABLET BY MOUTH ONCE A DAY FOR ERECTILE DYSFUNCTION  Current Outpatient Medications (Respiratory):    fluticasone (FLONASE) 50 MCG/ACT nasal spray, Place 2 sprays into both nostrils daily.   loratadine (CLARITIN) 10 MG tablet, Take 1 tablet (10 mg total) by mouth daily.  Current Outpatient Medications (Analgesics):    allopurinol (ZYLOPRIM) 100 MG tablet, TAKE 2 TABLETS(200 MG) BY MOUTH DAILY   aspirin 81 MG chewable tablet, Chew 81 mg by mouth daily.    traMADol (ULTRAM) 50 MG tablet, Take 1 tablet (50 mg total) by mouth every 8 (eight) hours as needed for severe pain (pain score 7-10).  Current Outpatient Medications (Hematological):    Cyanocobalamin (VITAMIN B-12 PO), Take 1 capsule by mouth daily.  Current Outpatient Medications (Other):    Cholecalciferol (VITAMIN D3 PO), Take 4,000 Units by mouth  daily.   diclofenac sodium (VOLTAREN) 1 % GEL, Apply 2 g topically 4 (four) times daily.   diltiazem 2 % GEL*, Apply 1 Application topically 2 (two) times daily. Using your index finger, apply a small amount of medication inside the rectum up to your first knuckle/joint twice daily x 4 weeks.   doxycycline (VIBRA-TABS) 100 MG tablet, Take 1 tablet (100 mg total) by mouth 2 (two) times daily.   mirabegron ER (MYRBETRIQ) 25 MG TB24 tablet, Take 1 tablet by mouth daily.   Multiple Vitamins-Minerals (ICAPS AREDS 2 PO), Take 1 capsule by mouth 2 (two) times a day.   pantoprazole (PROTONIX) 40 MG tablet, TAKE 1 TABLET(40 MG) BY MOUTH TWICE DAILY BEFORE A MEAL   polyethylene glycol powder (GLYCOLAX/MIRALAX) 17 GM/SCOOP powder, TAKE 1 TO 2 SCOOPS BY MOUTH DAILY AS DIRECTED   Pyridoxine HCl (VITAMIN B6 PO), Take 1 capsule by mouth daily.   Zoster Vaccine Adjuvanted [facility]) injection, Inject 0.5 ml for first dose and then repeat once per immunization schedule as directed for second final dose * These medications belong to multiple therapeutic classes and are listed under each applicable group.   Reviewed prior external information including notes and imaging from  primary care provider As well as notes that were available from care everywhere and other healthcare systems.  Past medical history, social, surgical and family history all reviewed in electronic medical record.  No pertanent information unless stated regarding to the chief complaint.   Review of Systems:  No headache, visual changes, nausea, vomiting, diarrhea, constipation, dizziness,  abdominal pain, skin rash, fevers, chills, night sweats, weight loss, swollen lymph nodes, body aches, joint swelling, chest pain, shortness of breath, mood changes. POSITIVE muscle aches recent URI that has caused cough  Objective  Blood pressure 122/78, pulse (!) 57, height 5\' 7"  (1.702 m), weight 183 lb (83 kg), SpO2 98%.   General: No apparent distress alert and oriented x3 mood and affect normal, dressed appropriately.  HEENT: Pupils equal, extraocular movements intact  Respiratory: Patient's speak in full sentences and does not appear short of breath  Cardiovascular: No lower extremity edema, non tender, no erythema  , Mild antalgic gait noted.  Right foot has some swelling over the proximal fifth metatarsal but does have significant improvement from previous exam.   Limited muscular skeletal ultrasound was performed and interpreted by Antoine Primas, M  Limited ultrasound shows the patient does have a soft callus formation at his metatarsal.  The fracture was not noted previously.  Hypoechoic changes and increasing in neovascularization noted  Osteopathic findings C6 flexed rotated and side bent left T3 extended rotated and side bent right inhaled third rib T9 extended rotated and side bent left L2 flexed rotated and side bent right L4 flexed rotated and side bent left Sacrum right on right    Impression and Recommendations:   Closed displaced fracture of fifth metatarsal bone of right foot Interval healing noted, continue with rigid soled shoes.  Do think it will take another month to heal completely.  Still avoid any twisting motions on the foot if possible.  Discussed icing regimen if necessary.  Increase activity slowly otherwise.  Follow-up with me again in 4 weeks  Spinal stenosis of lumbar region with radiculopathy Patient has had difficulty but has done remarkably well.  14 years out from his severe spinal stenosis diagnosis.  Continue to increase activity discussed  increasing activity as tolerated.  Follow-up with me again in 6 to 8 weeks otherwise.  Decision today to treat with OMT was based on Physical Exam  After verbal consent patient was treated with HVLA, ME, FPR techniques in cervical, thoracic, rib, lumbar and sacral areas, all areas are chronic   Patient tolerated the procedure well with improvement in symptoms  Patient given exercises, stretches and lifestyle modifications  See medications in patient instructions if given  Patient will follow up in 4-8 weeks  The above documentation has been reviewed and is accurate and complete [name], DO

## 2023-05-14 ENCOUNTER — Encounter: Payer: Self-pay | Admitting: Family Medicine

## 2023-05-14 ENCOUNTER — Other Ambulatory Visit: Payer: Self-pay

## 2023-05-14 ENCOUNTER — Ambulatory Visit: Payer: Medicare PPO | Admitting: Family Medicine

## 2023-05-14 VITALS — BP 122/78 | HR 57 | Ht 67.0 in | Wt 183.0 lb

## 2023-05-14 DIAGNOSIS — M79674 Pain in right toe(s): Secondary | ICD-10-CM | POA: Diagnosis not present

## 2023-05-14 DIAGNOSIS — M9902 Segmental and somatic dysfunction of thoracic region: Secondary | ICD-10-CM

## 2023-05-14 DIAGNOSIS — M9904 Segmental and somatic dysfunction of sacral region: Secondary | ICD-10-CM | POA: Diagnosis not present

## 2023-05-14 DIAGNOSIS — M48061 Spinal stenosis, lumbar region without neurogenic claudication: Secondary | ICD-10-CM

## 2023-05-14 DIAGNOSIS — M9903 Segmental and somatic dysfunction of lumbar region: Secondary | ICD-10-CM | POA: Diagnosis not present

## 2023-05-14 DIAGNOSIS — M5416 Radiculopathy, lumbar region: Secondary | ICD-10-CM

## 2023-05-14 DIAGNOSIS — S92351D Displaced fracture of fifth metatarsal bone, right foot, subsequent encounter for fracture with routine healing: Secondary | ICD-10-CM

## 2023-05-14 DIAGNOSIS — M9901 Segmental and somatic dysfunction of cervical region: Secondary | ICD-10-CM

## 2023-05-14 DIAGNOSIS — J209 Acute bronchitis, unspecified: Secondary | ICD-10-CM

## 2023-05-14 DIAGNOSIS — M9908 Segmental and somatic dysfunction of rib cage: Secondary | ICD-10-CM | POA: Diagnosis not present

## 2023-05-14 MED ORDER — DOXYCYCLINE HYCLATE 100 MG PO TABS: 100.0000 mg | ORAL_TABLET | Freq: Two times a day (BID) | ORAL | 0 refills | Status: AC

## 2023-05-14 NOTE — Assessment & Plan Note (Signed)
Interval healing noted, continue with rigid soled shoes.  Do think it will take another month to heal completely.  Still avoid any twisting motions on the foot if possible.  Discussed icing regimen if necessary.  Increase activity slowly otherwise.  Follow-up with me again in 4 weeks

## 2023-05-14 NOTE — Assessment & Plan Note (Signed)
Doxycycline prescribed

## 2023-05-14 NOTE — Patient Instructions (Addendum)
Doxycycline 2x for 10 days K2 daily for 1 month if you want See you again in 4-6 weeks

## 2023-05-14 NOTE — Assessment & Plan Note (Signed)
Patient has had difficulty but has done remarkably well.  14 years out from his severe spinal stenosis diagnosis.  Continue to increase activity discussed increasing activity as tolerated.  Follow-up with me again in 6 to 8 weeks otherwise.

## 2023-05-21 ENCOUNTER — Ambulatory Visit: Payer: Medicare PPO | Admitting: Gastroenterology

## 2023-05-25 ENCOUNTER — Encounter: Payer: Self-pay | Admitting: Family Medicine

## 2023-05-28 DIAGNOSIS — H353221 Exudative age-related macular degeneration, left eye, with active choroidal neovascularization: Secondary | ICD-10-CM | POA: Diagnosis not present

## 2023-05-28 DIAGNOSIS — H35363 Drusen (degenerative) of macula, bilateral: Secondary | ICD-10-CM | POA: Diagnosis not present

## 2023-05-28 DIAGNOSIS — H353111 Nonexudative age-related macular degeneration, right eye, early dry stage: Secondary | ICD-10-CM | POA: Diagnosis not present

## 2023-05-28 DIAGNOSIS — H35033 Hypertensive retinopathy, bilateral: Secondary | ICD-10-CM | POA: Diagnosis not present

## 2023-05-28 DIAGNOSIS — H04123 Dry eye syndrome of bilateral lacrimal glands: Secondary | ICD-10-CM | POA: Diagnosis not present

## 2023-06-02 DIAGNOSIS — L853 Xerosis cutis: Secondary | ICD-10-CM | POA: Diagnosis not present

## 2023-06-03 ENCOUNTER — Other Ambulatory Visit: Payer: Self-pay | Admitting: Family Medicine

## 2023-06-05 ENCOUNTER — Other Ambulatory Visit: Payer: Self-pay | Admitting: Internal Medicine

## 2023-06-11 NOTE — Progress Notes (Unsigned)
Paul [name] [address] [place] [address] Phone: [phone] Subjective:   [name], am serving as a scribe for Dr. Antoine Primas.  I'm seeing this patient by the request  of:  Paul Ramp, MD  CC: foot and back pain follow up   [id number]  Paul [name] is a 89 y.o. male coming in with complaint of back and neck pain. OMT 05/14/2023 and f/u for 5 met fx, R. Patient states not having much pain. Only hurts in certain positions. L shoulder check.  Medications patient has been prescribed: Doxycycline  Taking: No         Reviewed prior external information including notes and imaging from previsou exam, outside providers and external EMR if available.   As well as notes that were available from care everywhere and other healthcare systems.  Past medical history, social, surgical and family history all reviewed in electronic medical record.  No pertanent information unless stated regarding to the chief complaint.   Past Medical History:  Diagnosis Date   Enlarged prostate    GERD (gastroesophageal reflux disease)    Heart murmur    Hiatal hernia    History of cataract    Hypertension    Spinal stenosis     Allergies  Allergen Reactions   Ace Inhibitors Swelling    After taking for long time had (facial) angioedema on lisinopril     Review of Systems:  No headache, visual changes, nausea, vomiting, diarrhea, constipation, dizziness, abdominal pain, skin rash, fevers, chills, night sweats, weight loss, swollen lymph nodes, body aches, joint swelling, chest pain, shortness of breath, mood changes. POSITIVE muscle aches  Objective  Blood pressure 124/80, pulse 67, height 5\' 7"  (1.702 m), SpO2 98%.   General: No apparent distress alert and oriented x3 mood and affect normal, dressed appropriately.  HEENT: Pupils equal, extraocular movements intact  Respiratory: Patient's speak in full sentences and does not appear short of  breath  Cardiovascular: No lower extremity edema, non tender, no erythema  Gait MSK:  Back does have some loss lordosis noted.  Some tenderness to palpation in the paraspinal musculature.  Tightness noted with certain movements of the back.  Right foot exam shows nontender over the fourth and fifth metatarsal proximally.  Patient does still have some mild swelling in the area though noted.  Patient is neurovascularly intact.  Osteopathic findings  C2 flexed rotated and side bent right C6 flexed rotated and side bent left T3 extended rotated and side bent right inhaled rib T9 extended rotated and side bent left L2 flexed rotated and side bent right Sacrum right on right   Limited muscular skeletal ultrasound was performed and interpreted by Antoine Primas, M  Limited ultrasound shows that patient does have a soft callus formation noted over the proximal fifth metatarsal.  There is still some areas though where there is very minimal bone healing noted.  But does have some interval progression.    Assessment and Plan:  Sacroiliac joint dysfunction of right side Right sided does still to have some discomfort noted.  Still responding extremely well to osteopathic manipulation.  Do believe that the fifth metatarsal fracture is caused some mild exacerbation.  Will continue to monitor.  Follow-up again in 6 to 8 weeks  Closed displaced fracture of fifth metatarsal bone of right foot Unfortunately has seemed to be more displaced at the moment.  Discussed with patient about icing regimen.  Discussed still wearing the carbon  fiber plate.  Patient though is not having any significant pain.  Patient's on ultrasound does show that there is some soft callus formation noted.  In 1 month of continuing to have difficulty could be a candidate for potentially a bone stimulator.  Follow-up again in 4 weeks    Nonallopathic problems  Decision today to treat with OMT was based on Physical Exam  After  verbal consent patient was treated with HVLA, ME, FPR techniques in cervical, rib, thoracic, lumbar, and sacral  areas  Patient tolerated the procedure well with improvement in symptoms  Patient given exercises, stretches and lifestyle modifications  See medications in patient instructions if given  Patient will follow up in 4-8 weeks    The above documentation has been reviewed and is accurate and complete [name], DO          Note: This dictation was prepared with Dragon dictation along with smaller phrase technology. Any transcriptional errors that result from this process are unintentional.

## 2023-06-16 ENCOUNTER — Other Ambulatory Visit: Payer: Self-pay

## 2023-06-16 ENCOUNTER — Ambulatory Visit (INDEPENDENT_AMBULATORY_CARE_PROVIDER_SITE_OTHER): Payer: Medicare PPO

## 2023-06-16 ENCOUNTER — Encounter: Payer: Self-pay | Admitting: Family Medicine

## 2023-06-16 ENCOUNTER — Ambulatory Visit: Payer: Medicare PPO | Admitting: Family Medicine

## 2023-06-16 VITALS — BP 124/80 | HR 67 | Ht 67.0 in

## 2023-06-16 DIAGNOSIS — M533 Sacrococcygeal disorders, not elsewhere classified: Secondary | ICD-10-CM

## 2023-06-16 DIAGNOSIS — M9902 Segmental and somatic dysfunction of thoracic region: Secondary | ICD-10-CM

## 2023-06-16 DIAGNOSIS — M9903 Segmental and somatic dysfunction of lumbar region: Secondary | ICD-10-CM

## 2023-06-16 DIAGNOSIS — S92351D Displaced fracture of fifth metatarsal bone, right foot, subsequent encounter for fracture with routine healing: Secondary | ICD-10-CM

## 2023-06-16 DIAGNOSIS — M9901 Segmental and somatic dysfunction of cervical region: Secondary | ICD-10-CM

## 2023-06-16 DIAGNOSIS — M9908 Segmental and somatic dysfunction of rib cage: Secondary | ICD-10-CM | POA: Diagnosis not present

## 2023-06-16 DIAGNOSIS — M9904 Segmental and somatic dysfunction of sacral region: Secondary | ICD-10-CM | POA: Diagnosis not present

## 2023-06-16 DIAGNOSIS — M85871 Other specified disorders of bone density and structure, right ankle and foot: Secondary | ICD-10-CM | POA: Diagnosis not present

## 2023-06-16 DIAGNOSIS — S92351A Displaced fracture of fifth metatarsal bone, right foot, initial encounter for closed fracture: Secondary | ICD-10-CM | POA: Diagnosis not present

## 2023-06-16 NOTE — Assessment & Plan Note (Signed)
Unfortunately has seemed to be more displaced at the moment.  Discussed with patient about icing regimen.  Discussed still wearing the carbon fiber plate.  Patient though is not having any significant pain.  Patient's on ultrasound does show that there is some soft callus formation noted.  In 1 month of continuing to have difficulty could be a candidate for potentially a bone stimulator.  Follow-up again in 4 weeks

## 2023-06-16 NOTE — Assessment & Plan Note (Signed)
Right sided does still to have some discomfort noted.  Still responding extremely well to osteopathic manipulation.  Do believe that the fifth metatarsal fracture is caused some mild exacerbation.  Will continue to monitor.  Follow-up again in 6 to 8 weeks

## 2023-06-16 NOTE — Patient Instructions (Addendum)
We'll keep watching the foot, but should be okay See you again in 5 weeks

## 2023-06-25 DIAGNOSIS — H353112 Nonexudative age-related macular degeneration, right eye, intermediate dry stage: Secondary | ICD-10-CM | POA: Diagnosis not present

## 2023-06-25 DIAGNOSIS — H52203 Unspecified astigmatism, bilateral: Secondary | ICD-10-CM | POA: Diagnosis not present

## 2023-06-25 DIAGNOSIS — H524 Presbyopia: Secondary | ICD-10-CM | POA: Diagnosis not present

## 2023-06-25 DIAGNOSIS — H353221 Exudative age-related macular degeneration, left eye, with active choroidal neovascularization: Secondary | ICD-10-CM | POA: Diagnosis not present

## 2023-06-25 DIAGNOSIS — Z961 Presence of intraocular lens: Secondary | ICD-10-CM | POA: Diagnosis not present

## 2023-06-25 DIAGNOSIS — H04123 Dry eye syndrome of bilateral lacrimal glands: Secondary | ICD-10-CM | POA: Diagnosis not present

## 2023-07-01 ENCOUNTER — Other Ambulatory Visit: Payer: Self-pay | Admitting: Family Medicine

## 2023-07-02 DIAGNOSIS — N138 Other obstructive and reflux uropathy: Secondary | ICD-10-CM | POA: Diagnosis not present

## 2023-07-02 DIAGNOSIS — N3941 Urge incontinence: Secondary | ICD-10-CM | POA: Diagnosis not present

## 2023-07-02 DIAGNOSIS — N401 Enlarged prostate with lower urinary tract symptoms: Secondary | ICD-10-CM | POA: Diagnosis not present

## 2023-07-06 ENCOUNTER — Encounter: Payer: Self-pay | Admitting: Family Medicine

## 2023-07-06 DIAGNOSIS — H353221 Exudative age-related macular degeneration, left eye, with active choroidal neovascularization: Secondary | ICD-10-CM | POA: Diagnosis not present

## 2023-07-13 ENCOUNTER — Other Ambulatory Visit: Payer: Self-pay | Admitting: Family Medicine

## 2023-07-20 ENCOUNTER — Other Ambulatory Visit: Payer: Self-pay | Admitting: Family Medicine

## 2023-07-20 DIAGNOSIS — N3941 Urge incontinence: Secondary | ICD-10-CM | POA: Diagnosis not present

## 2023-07-20 DIAGNOSIS — N3943 Post-void dribbling: Secondary | ICD-10-CM | POA: Diagnosis not present

## 2023-07-20 DIAGNOSIS — R102 Pelvic and perineal pain: Secondary | ICD-10-CM | POA: Diagnosis not present

## 2023-07-20 NOTE — Progress Notes (Unsigned)
 [name] [address] [place] [address] Phone: [phone] Subjective:   Paul [name], am serving as a scribe for Dr. Antoine Primas.  I'm seeing this patient by the request  of:  [name], MD  CC: Neck and back pain follow-up, right foot pain follow-up  [id number]  Paul [name] is a 89 y.o. male coming in with complaint of back and neck pain. OMT on 06/16/2023. Patient states doing better. Not having pain in foot. Wearing carbon fiber plate. Wants to know when he can bend foot.  Medications patient has been prescribed:   Taking:         Reviewed prior external information including notes and imaging from previsou exam, outside providers and external EMR if available.   As well as notes that were available from care everywhere and other healthcare systems.  Past medical history, social, surgical and family history all reviewed in electronic medical record.  No pertanent information unless stated regarding to the chief complaint.   Past Medical History:  Diagnosis Date   Enlarged prostate    GERD (gastroesophageal reflux disease)    Heart murmur    Hiatal hernia    History of cataract    Hypertension    Spinal stenosis     Allergies  Allergen Reactions   Ace Inhibitors Swelling    After taking for long time had (facial) angioedema on lisinopril     Review of Systems:  No headache, visual changes, nausea, vomiting, diarrhea, constipation, dizziness, abdominal pain, skin rash, fevers, chills, night sweats, weight loss, swollen lymph nodes, body aches, joint swelling, chest pain, shortness of breath, mood changes. POSITIVE muscle aches  Objective  Blood pressure 128/80, pulse 64, height 5\' 7"  (1.702 m), weight 186 lb (84.4 kg), SpO2 97%.   General: No apparent distress alert and oriented x3 mood and affect normal, dressed appropriately.  HEENT: Pupils equal, extraocular movements intact  Respiratory: Patient's speak  in full sentences and does not appear short of breath  Cardiovascular: No lower extremity edema, non tender, no erythema  Gait mild antalgic but very mild pain over the base of the fifth metatarsal.   Limited muscular skeletal ultrasound was performed and interpreted by Antoine Primas, M  Limited ultrasound shows patient still has a cortical defect noted to the fifth metatarsal.  This is consistent with a slow to no healing noted.  MSK:  Back exam shows loss lordosis noted.  Some degenerative scoliosis noted.  Osteopathic findings  C2 flexed rotated and side bent right C6 flexed rotated and side bent right T3 extended rotated and side bent right inhaled rib  L2 flexed rotated and side bent right Sacrum right on right     Assessment and Plan:  Closed displaced fracture of fifth metatarsal bone of right foot Unfortunately patient is not healing and is now 3 months out from previous injury.  This is severe at the moment.  Do feel that patient needs a bone stimulator.  Patient will continue to wear the carbon fiber plate.  Discussed icing regimen at home exercises otherwise.  Discussed avoiding certain activities.  Increase activity slowly.  Follow-up again in 6 to 8 weeks.    Nonallopathic problems  Decision today to treat with OMT was based on Physical Exam  After verbal consent patient was treated with HVLA, ME, FPR techniques in cervical, rib, thoracic, lumbar, and sacral  areas  Patient tolerated the procedure well with improvement in symptoms  Patient given  exercises, stretches and lifestyle modifications  See medications in patient instructions if given  Patient will follow up in 4-8 weeks     The above documentation has been reviewed and is accurate and complete [name], DO         Note: This dictation was prepared with Dragon dictation along with smaller phrase technology. Any transcriptional errors that result from this process are unintentional.

## 2023-07-21 ENCOUNTER — Encounter: Payer: Self-pay | Admitting: Family Medicine

## 2023-07-21 ENCOUNTER — Ambulatory Visit: Payer: Medicare PPO | Admitting: Family Medicine

## 2023-07-21 ENCOUNTER — Other Ambulatory Visit: Payer: Self-pay

## 2023-07-21 VITALS — BP 128/80 | HR 64 | Ht 67.0 in | Wt 186.0 lb

## 2023-07-21 DIAGNOSIS — M9904 Segmental and somatic dysfunction of sacral region: Secondary | ICD-10-CM | POA: Diagnosis not present

## 2023-07-21 DIAGNOSIS — S92351D Displaced fracture of fifth metatarsal bone, right foot, subsequent encounter for fracture with routine healing: Secondary | ICD-10-CM

## 2023-07-21 DIAGNOSIS — K219 Gastro-esophageal reflux disease without esophagitis: Secondary | ICD-10-CM | POA: Diagnosis not present

## 2023-07-21 DIAGNOSIS — M9908 Segmental and somatic dysfunction of rib cage: Secondary | ICD-10-CM | POA: Diagnosis not present

## 2023-07-21 DIAGNOSIS — M5416 Radiculopathy, lumbar region: Secondary | ICD-10-CM

## 2023-07-21 DIAGNOSIS — M48061 Spinal stenosis, lumbar region without neurogenic claudication: Secondary | ICD-10-CM | POA: Diagnosis not present

## 2023-07-21 DIAGNOSIS — M9903 Segmental and somatic dysfunction of lumbar region: Secondary | ICD-10-CM | POA: Diagnosis not present

## 2023-07-21 DIAGNOSIS — N400 Enlarged prostate without lower urinary tract symptoms: Secondary | ICD-10-CM | POA: Diagnosis not present

## 2023-07-21 DIAGNOSIS — N1832 Chronic kidney disease, stage 3b: Secondary | ICD-10-CM | POA: Diagnosis not present

## 2023-07-21 DIAGNOSIS — M9902 Segmental and somatic dysfunction of thoracic region: Secondary | ICD-10-CM

## 2023-07-21 DIAGNOSIS — M9901 Segmental and somatic dysfunction of cervical region: Secondary | ICD-10-CM

## 2023-07-21 DIAGNOSIS — I129 Hypertensive chronic kidney disease with stage 1 through stage 4 chronic kidney disease, or unspecified chronic kidney disease: Secondary | ICD-10-CM | POA: Diagnosis not present

## 2023-07-21 NOTE — Assessment & Plan Note (Signed)
 Unfortunately patient is not healing and is now 3 months out from previous injury.  This is severe at the moment.  Do feel that patient needs a bone stimulator.  Patient will continue to wear the carbon fiber plate.  Discussed icing regimen at home exercises otherwise.  Discussed avoiding certain activities.  Increase activity slowly.  Follow-up again in 6 to 8 weeks.

## 2023-07-21 NOTE — Assessment & Plan Note (Signed)
 Continued tightness but is able to do all activities without any significant discomfort or pain.  Patient does have.  Icing regimen that has been helpful somewhat.  Continuing to work on tai chi.  Follow-up again in 6 to 8 weeks

## 2023-07-21 NOTE — Patient Instructions (Signed)
 Stay active Would still wear plate just in case Keep taking other peoples money [employer] will reach out about bone stimulator See you again in 6-8 weeks

## 2023-08-03 DIAGNOSIS — N3943 Post-void dribbling: Secondary | ICD-10-CM | POA: Diagnosis not present

## 2023-08-03 DIAGNOSIS — N3941 Urge incontinence: Secondary | ICD-10-CM | POA: Diagnosis not present

## 2023-08-03 DIAGNOSIS — R102 Pelvic and perineal pain: Secondary | ICD-10-CM | POA: Diagnosis not present

## 2023-08-04 DIAGNOSIS — H353221 Exudative age-related macular degeneration, left eye, with active choroidal neovascularization: Secondary | ICD-10-CM | POA: Diagnosis not present

## 2023-08-10 DIAGNOSIS — R102 Pelvic and perineal pain: Secondary | ICD-10-CM | POA: Diagnosis not present

## 2023-08-10 DIAGNOSIS — N3941 Urge incontinence: Secondary | ICD-10-CM | POA: Diagnosis not present

## 2023-08-10 DIAGNOSIS — N3943 Post-void dribbling: Secondary | ICD-10-CM | POA: Diagnosis not present

## 2023-08-14 ENCOUNTER — Other Ambulatory Visit: Payer: Self-pay | Admitting: Family Medicine

## 2023-08-17 DIAGNOSIS — N3941 Urge incontinence: Secondary | ICD-10-CM | POA: Diagnosis not present

## 2023-08-17 DIAGNOSIS — R102 Pelvic and perineal pain: Secondary | ICD-10-CM | POA: Diagnosis not present

## 2023-08-17 DIAGNOSIS — N3943 Post-void dribbling: Secondary | ICD-10-CM | POA: Diagnosis not present

## 2023-08-24 DIAGNOSIS — R102 Pelvic and perineal pain: Secondary | ICD-10-CM | POA: Diagnosis not present

## 2023-08-24 DIAGNOSIS — N3941 Urge incontinence: Secondary | ICD-10-CM | POA: Diagnosis not present

## 2023-08-24 DIAGNOSIS — N3943 Post-void dribbling: Secondary | ICD-10-CM | POA: Diagnosis not present

## 2023-08-25 ENCOUNTER — Other Ambulatory Visit: Payer: Self-pay | Admitting: Family Medicine

## 2023-09-01 NOTE — Progress Notes (Unsigned)
 [facility] [address] [place] [address] Phone: [phone] Subjective:   Paul [name], am serving as a scribe for Dr. Ronnell Coins.  I'm seeing this patient by the request  of:  Charise Companion, MD  CC: low back pain follow up   [id number]  Paul [name] is a 89 y.o. male coming in with complaint of back and neck pain. OMT on 07/21/2023. Patient states that his back is stiff.   Continues to wear carbon fiber plate in shoe. No pain in foot but wants to know if it has heeled up.           Reviewed prior external information including notes and imaging from previsou exam, outside providers and external EMR if available.   As well as notes that were available from care everywhere and other healthcare systems.  Past medical history, social, surgical and family history all reviewed in electronic medical record.  No pertanent information unless stated regarding to the chief complaint.   Past Medical History:  Diagnosis Date   Enlarged prostate    GERD (gastroesophageal reflux disease)    Heart murmur    Hiatal hernia    History of cataract    Hypertension    Spinal stenosis     Allergies  Allergen Reactions   Ace Inhibitors Swelling    After taking for long time had (facial) angioedema on lisinopril      Review of Systems:  No headache, visual changes, nausea, vomiting, diarrhea, constipation, dizziness, abdominal pain, skin rash, fevers, chills, night sweats, weight loss, swollen lymph nodes, body aches, joint swelling, chest pain, shortness of breath, mood changes. POSITIVE muscle aches  Objective  Blood pressure 138/86, pulse 62, height 5\' 7"  (1.702 m), weight 183 lb (83 kg), SpO2 98%.   General: No apparent distress alert and oriented x3 mood and affect normal, dressed appropriately.  HEENT: Pupils equal, extraocular movements intact  Respiratory: Patient's speak in full sentences and does not appear short of breath   Cardiovascular: No lower extremity edema, non tender, no erythema  Gait normal but does seem to be favoring the right side.  Does have some scoliosis noted at the lumbar spine. MSK:  Back as mentioned above does have scoliosis.  Significant tightness noted in the paraspinal musculature. Right foot exam is nontender on exam over the fifth metatarsal proximally.  Limited muscular skeletal ultrasound was performed and interpreted by Ronnell Coins, M  Limited ultrasound still shows that there is a cortical irregularity noted of the fifth metatarsal.  Seems to be only on the lateral aspect.  Medial aspect of the fifth metatarsal is continuous no that does show good healing.  Osteopathic findings  C2 flexed rotated and side bent right C5 flexed rotated and side bent left T5 extended rotated and side bent right inhaled rib T8 extended rotated and side bent right  L2 flexed rotated and side bent right Sacrum right on right    Assessment and Plan:  Spinal stenosis of lumbar region with radiculopathy Highly concerned with some worsening again.  Patient has been active but unfortunately starting to have more leg pain which usually is 1 of patient's symptoms when he needs a potential for an epidural.  Patient is going to consider it.  Will order and placed at the moment.  Has responded to osteopathic manipulation otherwise.  Continue his yoga, tai chi, biking, therapy, walking his dog and follow-up again in 6 to 8 weeks  Closed displaced fracture  of fifth metatarsal bone of right foot I believe there is a small part that is a nonhealing fracture noted to the fifth metatarsal.  We discussed the possibility of a bone stimulator.  Patient would like to try to progress out of the carbon fiber plate and see how he does.  He knows to contact us  if worsening pain.  Able to do activities as tolerated    Nonallopathic problems  Decision today to treat with OMT was based on Physical Exam  After verbal  consent patient was treated with HVLA, ME, FPR techniques in cervical, rib, thoracic, lumbar, and sacral  areas  Patient tolerated the procedure well with improvement in symptoms  Patient given exercises, stretches and lifestyle modifications  See medications in patient instructions if given  Patient will follow up in 4-8 weeks    The above documentation has been reviewed and is accurate and complete Davonta Stroot M Frederick Marro, DO          Note: This dictation was prepared with Dragon dictation along with smaller phrase technology. Any transcriptional errors that result from this process are unintentional.

## 2023-09-02 ENCOUNTER — Ambulatory Visit: Admitting: Family Medicine

## 2023-09-02 VITALS — BP 138/86 | HR 62 | Ht 67.0 in | Wt 183.0 lb

## 2023-09-02 DIAGNOSIS — M9901 Segmental and somatic dysfunction of cervical region: Secondary | ICD-10-CM | POA: Diagnosis not present

## 2023-09-02 DIAGNOSIS — S92351D Displaced fracture of fifth metatarsal bone, right foot, subsequent encounter for fracture with routine healing: Secondary | ICD-10-CM

## 2023-09-02 DIAGNOSIS — M9903 Segmental and somatic dysfunction of lumbar region: Secondary | ICD-10-CM

## 2023-09-02 DIAGNOSIS — M5416 Radiculopathy, lumbar region: Secondary | ICD-10-CM

## 2023-09-02 DIAGNOSIS — M48061 Spinal stenosis, lumbar region without neurogenic claudication: Secondary | ICD-10-CM | POA: Diagnosis not present

## 2023-09-02 DIAGNOSIS — M9902 Segmental and somatic dysfunction of thoracic region: Secondary | ICD-10-CM

## 2023-09-02 DIAGNOSIS — M9904 Segmental and somatic dysfunction of sacral region: Secondary | ICD-10-CM

## 2023-09-02 DIAGNOSIS — M9908 Segmental and somatic dysfunction of rib cage: Secondary | ICD-10-CM

## 2023-09-02 DIAGNOSIS — M533 Sacrococcygeal disorders, not elsewhere classified: Secondary | ICD-10-CM | POA: Diagnosis not present

## 2023-09-02 NOTE — Patient Instructions (Addendum)
 [facility] [phone] to schedule epidural  Stop the plate See you again in 6 weeks

## 2023-09-03 ENCOUNTER — Encounter: Payer: Self-pay | Admitting: Family Medicine

## 2023-09-03 NOTE — Assessment & Plan Note (Signed)
 Highly concerned with some worsening again.  Patient has been active but unfortunately starting to have more leg pain which usually is 1 of patient's symptoms when he needs a potential for an epidural.  Patient is going to consider it.  Will order and placed at the moment.  Has responded to osteopathic manipulation otherwise.  Continue his yoga, tai chi, biking, therapy, walking his dog and follow-up again in 6 to 8 weeks

## 2023-09-03 NOTE — Assessment & Plan Note (Signed)
 I believe there is a small part that is a nonhealing fracture noted to the fifth metatarsal.  We discussed the possibility of a bone stimulator.  Patient would like to try to progress out of the carbon fiber plate and see how he does.  He knows to contact us  if worsening pain.  Able to do activities as tolerated

## 2023-09-04 ENCOUNTER — Other Ambulatory Visit: Payer: Self-pay | Admitting: Family Medicine

## 2023-09-05 ENCOUNTER — Other Ambulatory Visit: Payer: Self-pay | Admitting: Internal Medicine

## 2023-09-05 ENCOUNTER — Other Ambulatory Visit: Payer: Self-pay | Admitting: Family Medicine

## 2023-09-07 DIAGNOSIS — N3943 Post-void dribbling: Secondary | ICD-10-CM | POA: Diagnosis not present

## 2023-09-07 DIAGNOSIS — N3941 Urge incontinence: Secondary | ICD-10-CM | POA: Diagnosis not present

## 2023-09-07 DIAGNOSIS — R102 Pelvic and perineal pain: Secondary | ICD-10-CM | POA: Diagnosis not present

## 2023-09-08 DIAGNOSIS — H353221 Exudative age-related macular degeneration, left eye, with active choroidal neovascularization: Secondary | ICD-10-CM | POA: Diagnosis not present

## 2023-10-12 NOTE — Progress Notes (Unsigned)
 [name] [address] [place] [address] Phone: [phone] Subjective:   Paul [name], am serving as a scribe for Dr. Ronnell Coins.  I'Paul seeing this patient by the request  of:  Charise Companion, MD  CC: Back and neck pain  [id number]  Paul [name] is a 89 y.o. male coming in with complaint of back and neck pain. OMT on 09/02/2023. Patient states same per usual. No symptoms.  Medications patient has been prescribed:   Taking:         Reviewed prior external information including notes and imaging from previsou exam, outside providers and external EMR if available.   As well as notes that were available from care everywhere and other healthcare systems.  Past medical history, social, surgical and family history all reviewed in electronic medical record.  No pertanent information unless stated regarding to the chief complaint.   Past Medical History:  Diagnosis Date   Enlarged prostate    GERD (gastroesophageal reflux disease)    Heart murmur    Hiatal hernia    History of cataract    Hypertension    Spinal stenosis     Allergies  Allergen Reactions   Ace Inhibitors Swelling    After taking for long time had (facial) angioedema on lisinopril      Review of Systems:  No headache, visual changes, nausea, vomiting, diarrhea, constipation, dizziness, abdominal pain, skin rash, fevers, chills, night sweats, weight loss, swollen lymph nodes, body aches, joint swelling, chest pain, shortness of breath, mood changes. POSITIVE muscle aches  Objective  Blood pressure 122/82, pulse 76, height 5' 7 (1.702 Paul), weight 184 lb (83.5 kg), SpO2 98%.   General: No apparent distress alert and oriented x3 mood and affect normal, dressed appropriately.  HEENT: Pupils equal, extraocular movements intact  Respiratory: Patient's speak in full sentences and does not appear short of breath  Cardiovascular: No lower extremity edema, non tender, no  erythema  Gait MSK:  Back does have some loss lordosis noted.  Some tenderness to palpation in paraspinal musculature.  Tightness noted in all ways at the moment.  Seems that the tightness is more in the thoracolumbar juncture.  Osteopathic findings  C2 flexed rotated and side bent right C6 flexed rotated and side bent left C7 flexed rotated and side bent right T3 extended rotated and side bent right inhaled rib T6 extended rotated and side bent left L2 flexed rotated and side bent right Sacrum right on right       Assessment and Plan:  Spinal stenosis of lumbar region with radiculopathy Does have some tightness is noted.  Some tenderness noted in the paraspinal musculature of the lumbar spine.  I do think does a thoracolumbar juncture is tighter than what it was previously.  Seems to be getting more discomfort and pain.  Think it is more secondary to the rotational component during his golf swing.  Given some more mid range of motion exercises that I Paul will be more beneficial.  Did respond a little bit to manipulation.  Send had significant tightness noted.  Will follow-up again in 6 to 8 weeks.    Nonallopathic problems  Decision today to treat with OMT was based on Physical Exam  After verbal consent patient was treated with ME, FPR techniques in cervical, rib, thoracic, lumbar, and sacral  areas  Patient tolerated the procedure well with improvement in symptoms  Patient given exercises, stretches and lifestyle modifications  See medications  in patient instructions if given  Patient will follow up in 4-8 weeks     The above documentation has been reviewed and is accurate and complete Paul [name] Paul [name], DO         Note: This dictation was prepared with Dragon dictation along with smaller phrase technology. Any transcriptional errors that result from this process are unintentional.

## 2023-10-13 ENCOUNTER — Encounter: Payer: Self-pay | Admitting: Family Medicine

## 2023-10-13 ENCOUNTER — Ambulatory Visit: Admitting: Family Medicine

## 2023-10-13 VITALS — BP 122/82 | HR 76 | Ht 67.0 in | Wt 184.0 lb

## 2023-10-13 DIAGNOSIS — M9904 Segmental and somatic dysfunction of sacral region: Secondary | ICD-10-CM | POA: Diagnosis not present

## 2023-10-13 DIAGNOSIS — M9908 Segmental and somatic dysfunction of rib cage: Secondary | ICD-10-CM

## 2023-10-13 DIAGNOSIS — M9901 Segmental and somatic dysfunction of cervical region: Secondary | ICD-10-CM | POA: Diagnosis not present

## 2023-10-13 DIAGNOSIS — M9903 Segmental and somatic dysfunction of lumbar region: Secondary | ICD-10-CM

## 2023-10-13 DIAGNOSIS — M5416 Radiculopathy, lumbar region: Secondary | ICD-10-CM | POA: Diagnosis not present

## 2023-10-13 DIAGNOSIS — M9902 Segmental and somatic dysfunction of thoracic region: Secondary | ICD-10-CM | POA: Diagnosis not present

## 2023-10-13 DIAGNOSIS — M48061 Spinal stenosis, lumbar region without neurogenic claudication: Secondary | ICD-10-CM | POA: Diagnosis not present

## 2023-10-13 NOTE — Assessment & Plan Note (Signed)
 Does have some tightness is noted.  Some tenderness noted in the paraspinal musculature of the lumbar spine.  I do think does a thoracolumbar juncture is tighter than what it was previously.  Seems to be getting more discomfort and pain.  Think it is more secondary to the rotational component during his golf swing.  Given some more mid range of motion exercises that I hope will be more beneficial.  Did respond a little bit to manipulation.  Send had significant tightness noted.  Will follow-up again in 6 to 8 weeks.

## 2023-10-13 NOTE — Patient Instructions (Signed)
Do prescribed exercises at least 3x a week  See you again in 2 months

## 2023-10-14 ENCOUNTER — Encounter: Payer: Self-pay | Admitting: Family Medicine

## 2023-10-14 ENCOUNTER — Ambulatory Visit (INDEPENDENT_AMBULATORY_CARE_PROVIDER_SITE_OTHER): Admitting: Family Medicine

## 2023-10-14 VITALS — BP 120/80 | HR 61 | Ht 67.0 in | Wt 184.4 lb

## 2023-10-14 DIAGNOSIS — E785 Hyperlipidemia, unspecified: Secondary | ICD-10-CM

## 2023-10-14 DIAGNOSIS — R011 Cardiac murmur, unspecified: Secondary | ICD-10-CM

## 2023-10-14 DIAGNOSIS — Z0000 Encounter for general adult medical examination without abnormal findings: Secondary | ICD-10-CM

## 2023-10-14 DIAGNOSIS — I10 Essential (primary) hypertension: Secondary | ICD-10-CM

## 2023-10-14 MED ORDER — BACITRACIN 500 UNIT/GM EX OINT: 1.0000 | TOPICAL_OINTMENT | Freq: Two times a day (BID) | CUTANEOUS | 0 refills | Status: AC

## 2023-10-14 NOTE — Assessment & Plan Note (Signed)
 Excellent physical shape. Has gained a couple of pounds and we discussed. Very active UTD health maintenance. Labs,

## 2023-10-14 NOTE — Progress Notes (Signed)
    CHIEF COMPLAINT / HPI:  Well adult health check BP at home has been running 120/70s. Having right sided ear fullness Back pain continues. He follows with Dr Felipe Horton and they have been discussing whether or not it is time for another back injection (last one > 18 months ago he thinks). Wants t put off as long as possible. Still playing golf. Noticed new nodule at posterior left neck. He said a dermatologist visits their living facility and they looked at it. Had been putting some steroid cream on it and has decreased in size. Not really painful, maybe a little tender. Has had some benefit from pelvic floor rehab as well as urology consult. Broke his foot (5th MT ) and has almost fully recovered from that.   PERTINENT  PMH / PSH: I have reviewed the patient's medications, allergies, past medical and surgical history, smoking status and updated in the EMR as appropriate.   OBJECTIVE:  BP 120/80   Pulse 61   Ht 5' 7 (1.702 m)   Wt 184 lb 6.4 oz (83.6 kg)   SpO2 100%   BMI 28.88 kg/m   Vital signs reviewed. GENERAL: Well-developed, well-nourished, no acute distress. CARDIOVASCULAR: Regular rate and rhythm slight murmur beginning of systole hear best at LSB LUNGS: Clear to auscultation bilaterally, no rales or wheeze. ABDOMEN: Soft positive bowel sounds NEURO: No gross focal neurological deficits. MSK: Movement of extremity x 4. PSYCH: AxOx4. Good eye contact.. No psychomotor retardation or agitation. Appropriate speech fluency and content. Asks and answers questions appropriately. Mood is congruent. SKIN: left posterior neck, 14 mm in widest diameter, soft mobile lump with central scab. No surrounding erythema. No drainage.  ASSESSMENT / PLAN:   Well adult health check Excellent physical shape. Has gained a couple of pounds and we discussed. Very active UTD health maintenance. Labs,   Violetta Grice MD

## 2023-10-14 NOTE — Patient Instructions (Signed)
 Put some bacitracin on the area on the back of  your neck twice a day. If it goes away totally in 4 weeks, you can stop and we do not need anything to do for it.  If it is NOT gone, send me a MYChart message and I will get you  in for  us  to biopsy or drain it.     Flonase  OTC for the ear fullness. Elbow exercise for weight management.

## 2023-10-15 ENCOUNTER — Encounter: Payer: Self-pay | Admitting: Family Medicine

## 2023-10-15 LAB — COMPREHENSIVE METABOLIC PANEL WITH GFR
ALT: 14 IU/L (ref 0–44)
AST: 22 IU/L (ref 0–40)
Albumin: 4.6 g/dL (ref 3.7–4.7)
Alkaline Phosphatase: 75 IU/L (ref 44–121)
BUN/Creatinine Ratio: 15 (ref 10–24)
BUN: 20 mg/dL (ref 8–27)
Bilirubin Total: 0.4 mg/dL (ref 0.0–1.2)
CO2: 26 mmol/L (ref 20–29)
Calcium: 10 mg/dL (ref 8.6–10.2)
Chloride: 99 mmol/L (ref 96–106)
Creatinine, Ser: 1.31 mg/dL — ABNORMAL HIGH (ref 0.76–1.27)
Globulin, Total: 3.1 g/dL (ref 1.5–4.5)
Glucose: 92 mg/dL (ref 70–99)
Potassium: 4.2 mmol/L (ref 3.5–5.2)
Sodium: 144 mmol/L (ref 134–144)
Total Protein: 7.7 g/dL (ref 6.0–8.5)
eGFR: 53 mL/min/{1.73_m2} — ABNORMAL LOW (ref >59)

## 2023-10-15 LAB — LIPID PANEL
Chol/HDL Ratio: 3 ratio (ref 0.0–5.0)
Cholesterol, Total: 213 mg/dL — ABNORMAL HIGH (ref 100–199)
HDL: 70 mg/dL (ref >39)
LDL Chol Calc (NIH): 131 mg/dL — ABNORMAL HIGH (ref 0–99)
Triglycerides: 68 mg/dL (ref 0–149)
VLDL Cholesterol Cal: 12 mg/dL (ref 5–40)

## 2023-10-17 ENCOUNTER — Other Ambulatory Visit: Payer: Self-pay | Admitting: Medical Genetics

## 2023-10-19 ENCOUNTER — Other Ambulatory Visit (HOSPITAL_COMMUNITY)
Admission: RE | Admit: 2023-10-19 | Discharge: 2023-10-19 | Disposition: A | Discharge status: Home / Self Care | Payer: Self-pay | Source: Ambulatory Visit | Attending: Medical Genetics | Admitting: Medical Genetics

## 2023-10-20 ENCOUNTER — Ambulatory Visit: Payer: Self-pay | Admitting: Family Medicine

## 2023-10-20 DIAGNOSIS — H353221 Exudative age-related macular degeneration, left eye, with active choroidal neovascularization: Secondary | ICD-10-CM | POA: Diagnosis not present

## 2023-10-26 ENCOUNTER — Encounter: Payer: Self-pay | Admitting: Family Medicine

## 2023-10-30 LAB — GENECONNECT MOLECULAR SCREEN: Genetic Analysis Overall Interpretation: NEGATIVE

## 2023-11-03 ENCOUNTER — Encounter: Payer: Self-pay | Admitting: Family Medicine

## 2023-12-01 DIAGNOSIS — L814 Other melanin hyperpigmentation: Secondary | ICD-10-CM | POA: Diagnosis not present

## 2023-12-01 DIAGNOSIS — L821 Other seborrheic keratosis: Secondary | ICD-10-CM | POA: Diagnosis not present

## 2023-12-01 DIAGNOSIS — L720 Epidermal cyst: Secondary | ICD-10-CM | POA: Diagnosis not present

## 2023-12-05 ENCOUNTER — Other Ambulatory Visit: Payer: Self-pay | Admitting: Internal Medicine

## 2023-12-05 ENCOUNTER — Other Ambulatory Visit: Payer: Self-pay | Admitting: Family Medicine

## 2023-12-09 NOTE — Progress Notes (Unsigned)
 [name] [address] [place] [address] Phone: [phone] Subjective:   Paul [name], am serving as a scribe for Dr. Arthea Claudene.  I'Paul seeing this patient by the request  of:  Rosalynn Camie CROME, MD  CC: Back and neck pain follow-up  YEP:Dlagzrupcz  Paul [name] is a 89 y.o. male coming in with complaint of back and neck pain. OMT on 10/13/2023. Patient states that his back is about the same as last visit. Has not had to get epidural.    Medications patient has been prescribed:   Taking:         Reviewed prior external information including notes and imaging from previsou exam, outside providers and external EMR if available.   As well as notes that were available from care everywhere and other healthcare systems.  Past medical history, social, surgical and family history all reviewed in electronic medical record.  No pertanent information unless stated regarding to the chief complaint.   Past Medical History:  Diagnosis Date   Enlarged prostate    GERD (gastroesophageal reflux disease)    Heart murmur    Hiatal hernia    History of cataract    Hypertension    Spinal stenosis     Allergies  Allergen Reactions   Ace Inhibitors Swelling    After taking for long time had (facial) angioedema on lisinopril      Review of Systems:  No headache, visual changes, nausea, vomiting, diarrhea, constipation, dizziness, abdominal pain, skin rash, fevers, chills, night sweats, weight loss, swollen lymph nodes, body aches, joint swelling, chest pain, shortness of breath, mood changes. POSITIVE muscle aches  Objective  Blood pressure 132/78, pulse 69, height 5' 7 (1.702 Paul), weight 184 lb (83.5 kg), SpO2 98%.   General: No apparent distress alert and oriented x3 mood and affect normal, dressed appropriately.  HEENT: Pupils equal, extraocular movements intact  Respiratory: Patient's speak in full sentences and does not appear short of  breath  Cardiovascular: No lower extremity edema, non tender, no erythema  Gait relatively normal MSK:  Back severe loss lordosis noted.  Difficulty even getting to 0 degrees of extension.  Patient does have good range of motion of the hips bilaterally.  Negative straight leg test noted today. Arthritic changes of multiple other joints. Right knee is tender to palpation over the medial joint space.  Does have some arthritic changes noted.  Mild instability with valgus and varus force.  Osteopathic findings  C3 flexed rotated and side bent right C7 flexed rotated and side bent left T5 extended rotated and side bent right inhaled rib T9 extended rotated and side bent left L2 flexed rotated and side bent right Sacrum right on right     Assessment and Plan:  Spinal stenosis of lumbar region with radiculopathy Known severe spinal stenosis.  Will continue to monitor.  Has responded well to osteopathic manipulation.  Has had some intermittent radicular symptoms and may need another potential epidural.  Hopefully that this will not be something that would be needed regularly.  Patient has done remarkably well though and continues to golf and does even have a tournament this weekend.  Follow-up with me again in 6 to 8 weeks  Degenerative arthritis of right knee New problem for patient, has arthritic changes, can get x-ray if needed but no significant instability but medial unloader given with patient's past time.  I think that this will help unload the area where patient is having some  discomfort with certain movements.  Follow-up with me again 6 to 8 weeks.    Nonallopathic problems  Decision today to treat with OMT was based on Physical Exam  After verbal consent patient was treated with HVLA, ME, FPR techniques in cervical, rib, thoracic, lumbar, and sacral  areas  Patient tolerated the procedure well with improvement in symptoms  Patient given exercises, stretches and lifestyle  modifications  See medications in patient instructions if given  Patient will follow up in 4-8 weeks     The above documentation has been reviewed and is accurate and complete Paul [name] Paul Jana Swartzlander, DO         Note: This dictation was prepared with Dragon dictation along with smaller phrase technology. Any transcriptional errors that result from this process are unintentional.

## 2023-12-17 ENCOUNTER — Ambulatory Visit: Admitting: Family Medicine

## 2023-12-17 ENCOUNTER — Encounter: Payer: Self-pay | Admitting: Family Medicine

## 2023-12-17 VITALS — BP 132/78 | HR 69 | Ht 67.0 in | Wt 184.0 lb

## 2023-12-17 DIAGNOSIS — M9908 Segmental and somatic dysfunction of rib cage: Secondary | ICD-10-CM | POA: Diagnosis not present

## 2023-12-17 DIAGNOSIS — M9903 Segmental and somatic dysfunction of lumbar region: Secondary | ICD-10-CM

## 2023-12-17 DIAGNOSIS — M48061 Spinal stenosis, lumbar region without neurogenic claudication: Secondary | ICD-10-CM

## 2023-12-17 DIAGNOSIS — M9904 Segmental and somatic dysfunction of sacral region: Secondary | ICD-10-CM

## 2023-12-17 DIAGNOSIS — M1711 Unilateral primary osteoarthritis, right knee: Secondary | ICD-10-CM | POA: Diagnosis not present

## 2023-12-17 DIAGNOSIS — M5416 Radiculopathy, lumbar region: Secondary | ICD-10-CM

## 2023-12-17 DIAGNOSIS — M9901 Segmental and somatic dysfunction of cervical region: Secondary | ICD-10-CM

## 2023-12-17 DIAGNOSIS — M9902 Segmental and somatic dysfunction of thoracic region: Secondary | ICD-10-CM | POA: Diagnosis not present

## 2023-12-17 NOTE — Patient Instructions (Signed)
 R medial unloader No mercy See me in 6 weeks

## 2023-12-17 NOTE — Assessment & Plan Note (Signed)
 New problem for patient, has arthritic changes, can get x-ray if needed but no significant instability but medial unloader given with patient's past time.  I think that this will help unload the area where patient is having some discomfort with certain movements.  Follow-up with me again 6 to 8 weeks.

## 2023-12-17 NOTE — Assessment & Plan Note (Signed)
 Known severe spinal stenosis.  Will continue to monitor.  Has responded well to osteopathic manipulation.  Has had some intermittent radicular symptoms and may need another potential epidural.  Hopefully that this will not be something that would be needed regularly.  Patient has done remarkably well though and continues to golf and does even have a tournament this weekend.  Follow-up with me again in 6 to 8 weeks

## 2023-12-21 DIAGNOSIS — H353221 Exudative age-related macular degeneration, left eye, with active choroidal neovascularization: Secondary | ICD-10-CM | POA: Diagnosis not present

## 2024-01-28 NOTE — Progress Notes (Unsigned)
 [name] [address] [place] [address] Phone: [phone] Subjective:   Paul [name] Paul [name], am serving as a scribe for Dr. Arthea [name].  I'm seeing this patient by the request  of:  Paul Camie CROME, MD  CC: Low back pain  YEP:Dlagzrupcz  Paul [name] is a 89 y.o. male coming in with complaint of back and neck pain. OMT on 12/17/2023. Patient states doing relatively well does have some more tightness recently secondary to driving a longer distance.  Patient feels that he is having more difficulty standing straight up again.  Thinks it may be time for another epidural which has been quite sometime.  Denies any trouble with urination or any incontinence of the bowel.  Medications patient has been prescribed:   Taking:         Reviewed prior external information including notes and imaging from previsou exam, outside providers and external EMR if available.   As well as notes that were available from care everywhere and other healthcare systems.  Past medical history, social, surgical and family history all reviewed in electronic medical record.  No pertanent information unless stated regarding to the chief complaint.   Past Medical History:  Diagnosis Date   Enlarged prostate    GERD (gastroesophageal reflux disease)    Heart murmur    Hiatal hernia    History of cataract    Hypertension    Spinal stenosis     Allergies  Allergen Reactions   Ace Inhibitors Swelling    After taking for long time had (facial) angioedema on lisinopril      Review of Systems:  No headache, visual changes, nausea, vomiting, diarrhea, constipation, dizziness, abdominal pain, skin rash, fevers, chills, night sweats, weight loss, swollen lymph nodes, body aches, joint swelling, chest pain, shortness of breath, mood changes. POSITIVE muscle aches  Objective  Blood pressure (!) 140/82, pulse 72, height 5' 7 (1.702 m), weight 186 lb (84.4 kg), SpO2 98%.    General: No apparent distress alert and oriented x3 mood and affect normal, dressed appropriately.  HEENT: Pupils equal, extraocular movements intact  Respiratory: Patient's speak in full sentences and does not appear short of breath  Cardiovascular: No lower extremity edema, non tender, no erythema  Gait relatively normal but patient is unable to get full extension of his back. MSK:  Back significant stiffness noted.  Had difficulty even getting to 5 degrees of flexion.  Patient mild increase in kyphosis secondary to compensation.  Tender to palpation in the paraspinal musculature of the lumbar spine diffusely but no midline tenderness.  Tenderness over the right sacroiliac joint more than the left.  Osteopathic findings  T9 extended rotated and side bent left L2 flexed rotated and side bent right L5 flexed rotated and side bent left Sacrum right on right       Assessment and Plan:  Spinal stenosis of lumbar region with radiculopathy Multifactorial, worsening pain, increasing stiffness as well as increasing kyphosis compensating for the back at this time.  Discussed icing regimen and home exercises, discussed which activities to do in which ones to avoid.  Concern with patient's posture and increasing discomfort and fatigue in the legs that I do feel that now another epidural could be beneficial and will refer accordingly for this.  Increase activity slowly.  Discussed which activities to do and which ones to avoid.  Follow-up again in 6 to 8 weeks after the epidural to further evaluate..    Nonallopathic problems  Decision today to treat with OMT was based on Physical Exam  After verbal consent patient was treated with HVLA, ME, FPR techniques in thoracic, lumbar, and sacral  areas  Patient tolerated the procedure well with improvement in symptoms  Patient given exercises, stretches and lifestyle modifications  See medications in patient instructions if given  Patient will  follow up in 4-8 weeks    The above documentation has been reviewed and is accurate and complete Paul [name] M Paul Dekay, DO          Note: This dictation was prepared with Dragon dictation along with smaller phrase technology. Any transcriptional errors that result from this process are unintentional.

## 2024-02-01 ENCOUNTER — Other Ambulatory Visit: Payer: Self-pay

## 2024-02-01 ENCOUNTER — Ambulatory Visit: Admitting: Family Medicine

## 2024-02-01 VITALS — BP 140/82 | HR 72 | Ht 67.0 in | Wt 186.0 lb

## 2024-02-01 DIAGNOSIS — M48061 Spinal stenosis, lumbar region without neurogenic claudication: Secondary | ICD-10-CM

## 2024-02-01 DIAGNOSIS — M9902 Segmental and somatic dysfunction of thoracic region: Secondary | ICD-10-CM | POA: Diagnosis not present

## 2024-02-01 DIAGNOSIS — M9903 Segmental and somatic dysfunction of lumbar region: Secondary | ICD-10-CM

## 2024-02-01 DIAGNOSIS — M5416 Radiculopathy, lumbar region: Secondary | ICD-10-CM | POA: Diagnosis not present

## 2024-02-01 DIAGNOSIS — M9904 Segmental and somatic dysfunction of sacral region: Secondary | ICD-10-CM | POA: Diagnosis not present

## 2024-02-01 NOTE — Patient Instructions (Addendum)
 Great to see you Epidural [phone] See me in 6-8 weeks

## 2024-02-01 NOTE — Assessment & Plan Note (Addendum)
 Multifactorial, worsening pain, increasing stiffness as well as increasing kyphosis compensating for the back at this time.  Worsening chronic problems at this time.  Rates the severity of pain is 9 out of 10.  Having difficulty even doing daily activities.  Patient has responded extremely well with greater than a 80% improvement with last epidural.  Last epidural was in 2023.  Provided multiple months if not year of improvement.  Discussed icing regimen and home exercises, discussed which activities to do in which ones to avoid.  Concern with patient's posture and increasing discomfort and fatigue in the legs that I do feel that now another epidural could be beneficial and will refer accordingly for this.  Increase activity slowly.  Discussed which activities to do and which ones to avoid.  Follow-up again in 6 to 8 weeks after the epidural to further evaluate.SABRA

## 2024-02-02 ENCOUNTER — Encounter: Payer: Self-pay | Admitting: Family Medicine

## 2024-02-04 ENCOUNTER — Other Ambulatory Visit: Payer: Self-pay | Admitting: Family Medicine

## 2024-02-10 ENCOUNTER — Encounter: Payer: Self-pay | Admitting: Family Medicine

## 2024-02-10 ENCOUNTER — Ambulatory Visit

## 2024-02-10 ENCOUNTER — Ambulatory Visit: Admitting: Family Medicine

## 2024-02-10 VITALS — BP 146/82 | HR 69 | Ht 67.0 in | Wt 186.0 lb

## 2024-02-10 DIAGNOSIS — M542 Cervicalgia: Secondary | ICD-10-CM

## 2024-02-10 DIAGNOSIS — M47812 Spondylosis without myelopathy or radiculopathy, cervical region: Secondary | ICD-10-CM | POA: Diagnosis not present

## 2024-02-10 MED ORDER — CYCLOBENZAPRINE HCL 5 MG PO TABS: 5.0000 mg | ORAL_TABLET | Freq: Every evening | ORAL | 0 refills | Status: AC | PRN

## 2024-02-10 NOTE — Progress Notes (Signed)
   I, Paul [name], CMA acting as a scribe for Paul Lloyd, MD.  Paul [name] is a 89 y.o. male who presents to [facility] at [facility] today for neck pain. Pt was previously seen by Dr. Claudene on 02/01/24 for OMT.  Today, pt c/o neck pain that started today while playing golf. Pt reports he pulled his neck when golfing. Pt locates pain to left side of the neck. No relief with meds.   Radiates: denies UE Numbness/tingling: denies UE Weakness: denies Aggravates: ROM, especially when looking to the right Treatments tried: topical analgesic, Tylenol   Pertinent review of systems: No fevers or chills  Relevant historical information: Hypertension, lumbar spinal stenosis   Exam:  BP (!) 146/82   Pulse 69   Ht 5' 7 (1.702 m)   Wt 186 lb (84.4 kg)   SpO2 97%   BMI 29.13 kg/m  General: Well Developed, well nourished, and in no acute distress.   MSK: C-spine: Normal appearing. Nontender palpation spinal midline.  Tender palpation left cervical paraspinal musculature.  Decreased cervical motion.  Upper extremity strength is intact. Reflexes are intact.    Lab and Radiology Results  X-ray images cervical spine obtained today personally and independently interpreted. Moderate degenerative changes.  No acute fractures are visible. Await formal radiology review     Assessment and Plan: 89 y.o. male with acute exacerbation of neck pain due to muscle spasm and dysfunction.  Plan for heating pad, physical therapy muscle relaxers.  Dispensed a soft collar today which could be helpful.   PDMP not reviewed this encounter. Orders Placed This Encounter  Procedures   DG Cervical Spine 2 or 3 views    Standing Status:   Future    Number of Occurrences:   1    Expiration Date:   02/09/2025    Reason for Exam (SYMPTOM  OR DIAGNOSIS REQUIRED):   neck pain    Preferred imaging location?:   [facility]   Ambulatory referral to Physical Therapy    Referral  Priority:   Routine    Referral Type:   Physical Medicine    Referral Reason:   Specialty Services Required    Requested Specialty:   Physical Therapy    Number of Visits Requested:   1   Meds ordered this encounter  Medications   cyclobenzaprine  (FLEXERIL ) 5 MG tablet    Sig: Take 1 tablet (5 mg total) by mouth at bedtime as needed for muscle spasms.    Dispense:  30 tablet    Refill:  0     Discussed warning signs or symptoms. Please see discharge instructions. Patient expresses understanding.   The above documentation has been reviewed and is accurate and complete Paul [name], M.D.

## 2024-02-10 NOTE — Patient Instructions (Addendum)
 Thank you for coming in today.   Please get an Xray today before you leave   Rx sent in for muscle relaxer.  Recommend using heat.  A referral for physical therapy has been submitted. A representative from the physical therapy office will contact you to coordinate scheduling after confirming your benefits with your insurance provider. If you do not hear from the physical therapy office within the next 1-2 weeks, please let us  know.   See you back as needed with myself or Dr. Claudene.

## 2024-02-15 ENCOUNTER — Ambulatory Visit: Payer: Self-pay | Admitting: Family Medicine

## 2024-02-15 ENCOUNTER — Encounter: Payer: Self-pay | Admitting: Family Medicine

## 2024-02-15 DIAGNOSIS — M542 Cervicalgia: Secondary | ICD-10-CM

## 2024-02-15 NOTE — Progress Notes (Signed)
Cervical spine x-ray shows multilevel arthritis.

## 2024-02-16 ENCOUNTER — Encounter: Payer: Self-pay | Admitting: Family Medicine

## 2024-02-16 DIAGNOSIS — N3941 Urge incontinence: Secondary | ICD-10-CM | POA: Diagnosis not present

## 2024-02-16 DIAGNOSIS — N529 Male erectile dysfunction, unspecified: Secondary | ICD-10-CM | POA: Diagnosis not present

## 2024-02-16 DIAGNOSIS — R102 Pelvic and perineal pain unspecified side: Secondary | ICD-10-CM | POA: Diagnosis not present

## 2024-02-16 DIAGNOSIS — N138 Other obstructive and reflux uropathy: Secondary | ICD-10-CM | POA: Diagnosis not present

## 2024-02-16 DIAGNOSIS — N401 Enlarged prostate with lower urinary tract symptoms: Secondary | ICD-10-CM | POA: Diagnosis not present

## 2024-02-17 ENCOUNTER — Telehealth: Payer: Self-pay

## 2024-02-17 NOTE — Telephone Encounter (Signed)
 Insurance trying to deny epidural injection, needing information answer below to provide insurance to approve. I tried looking back at notes but they are not there.      Signs and symptoms of radicular pain that radiates in the same dermatome at requested levels   Current pain scale (Visual analog or numeric pain rating scale)   Plan of care with laterality and levels of the spine that the procedure will affect   Provide the laterality, level, percent and duration of relief in months from previous ESI done on 05/27/2021 per claims data.  *There was also one done in 08/19/21 not sure why they not saying that one as well, but may want to go ahead and give a response for that one as well

## 2024-02-18 ENCOUNTER — Other Ambulatory Visit

## 2024-02-22 DIAGNOSIS — H353221 Exudative age-related macular degeneration, left eye, with active choroidal neovascularization: Secondary | ICD-10-CM | POA: Diagnosis not present

## 2024-02-23 NOTE — Discharge Instructions (Signed)

## 2024-02-23 NOTE — Telephone Encounter (Signed)
 Can you check and see if Dr. Claudene has done this yet please. Or if he is wanting to go a different direction with care.   I looked at the note and I couldn't see where there was an addendum

## 2024-02-24 NOTE — Telephone Encounter (Signed)
 Insurance denied this injection yesterday and I have faxed an appeal today

## 2024-02-26 ENCOUNTER — Inpatient Hospital Stay
Admission: RE | Admit: 2024-02-26 | Discharge: 2024-02-26 | Disposition: A | Discharge status: Home / Self Care | Source: Ambulatory Visit | Attending: Family Medicine | Admitting: Family Medicine

## 2024-03-02 NOTE — Discharge Instructions (Signed)

## 2024-03-03 ENCOUNTER — Encounter: Payer: Self-pay | Admitting: Family Medicine

## 2024-03-03 ENCOUNTER — Ambulatory Visit
Admission: RE | Admit: 2024-03-03 | Discharge: 2024-03-03 | Disposition: A | Discharge status: Home / Self Care | Source: Ambulatory Visit | Attending: Family Medicine | Admitting: Family Medicine

## 2024-03-03 DIAGNOSIS — M4727 Other spondylosis with radiculopathy, lumbosacral region: Secondary | ICD-10-CM | POA: Diagnosis not present

## 2024-03-03 DIAGNOSIS — M5416 Radiculopathy, lumbar region: Secondary | ICD-10-CM

## 2024-03-03 MED ORDER — METHYLPREDNISOLONE ACETATE 40 MG/ML INJ SUSP (RADIOLOG
80.0000 mg | Freq: Once | INTRAMUSCULAR | Status: AC
  Administered 2024-03-03: 80 mg via EPIDURAL

## 2024-03-03 MED ORDER — IOPAMIDOL (ISOVUE-M 200) INJECTION 41%
1.0000 mL | Freq: Once | INTRAMUSCULAR | Status: AC
  Administered 2024-03-03: 1 mL via EPIDURAL

## 2024-03-07 NOTE — Telephone Encounter (Signed)
 Per referral note:  Type Date User Summary Attachment  General 02/19/2024 10:50 AM Gerome Ashley SAUNDERS, CMA Fax # [phone] -  Note: Fax # [phone] . Type Date User Summary Attachment  Provider Comments 02/19/2024  8:45 AM Gerome Fleeting S Provider Comments -  Note: [address] [facility], [facility], [address], [id number]   Evaluate and Treat for neck pain 1-2 times per week for 4-6 weeks.   Decrease pain, increase strength, flexibility, function, and range of motion.  Modalities may include, traction, ionto, phono, stim, and dry needling prn.

## 2024-03-09 DIAGNOSIS — M542 Cervicalgia: Secondary | ICD-10-CM | POA: Diagnosis not present

## 2024-03-18 NOTE — Progress Notes (Unsigned)
  [name] [address] [place] [address] Phone: [phone] Subjective:   Paul [name], am serving as a scribe for Dr. Arthea Claudene.  I'm seeing this patient by the request  of:  Rosalynn Camie CROME, MD  CC: Low back pain neck pain follow-up  YEP:Dlagzrupcz  Paul [name] is a 89 y.o. male coming in with complaint of back and neck pain. OMT on 02/01/2024. Patient states same per usual. Questions about R knee. Having pain sometimes with walking. Pain is located medially.   Medications patient has been prescribed:   Taking:    Since we have seen patient saw another provider for acute neck discomfort.  Cervical neck x-ray was independently visualized by me showing facet arthritic changes and degenerative disc disease from C3-C7.  Patient undergone an epidural for the lumbar spine at L4-L5 on November 6.   Reviewed prior external information including notes and imaging from previsou exam, outside providers and external EMR if available.   As well as notes that were available from care everywhere and other healthcare systems.  Past medical history, social, surgical and family history all reviewed in electronic medical record.  No pertanent information unless stated regarding to the chief complaint.   Past Medical History:  Diagnosis Date   Enlarged prostate    GERD (gastroesophageal reflux disease)    Heart murmur    Hiatal hernia    History of cataract    Hypertension    Spinal stenosis     Allergies  Allergen Reactions   Ace Inhibitors Swelling    After taking for long time had (facial) angioedema on lisinopril      Review of Systems:  No headache, visual changes, nausea, vomiting, diarrhea, constipation, dizziness, abdominal pain, skin rash, fevers, chills, night sweats, weight loss, swollen lymph nodes, body aches, joint swelling, chest pain, shortness of breath, mood changes. POSITIVE muscle aches  Objective  There were no vitals  taken for this visit.   General: No apparent distress alert and oriented x3 mood and affect normal, dressed appropriately.  HEENT: Pupils equal, extraocular movements intact  Respiratory: Patient's speak in full sentences and does not appear short of breath  Cardiovascular: No lower extremity edema, non tender, no erythema  Gait MSK:  Back   Osteopathic findings  C2 flexed rotated and side bent right C6 flexed rotated and side bent left T3 extended rotated and side bent right inhaled rib T9 extended rotated and side bent left L2 flexed rotated and side bent right Sacrum right on right       Assessment and Plan:  No problem-specific Assessment & Plan notes found for this encounter.    Nonallopathic problems  Decision today to treat with OMT was based on Physical Exam  After verbal consent patient was treated with HVLA, ME, FPR techniques in cervical, rib, thoracic, lumbar, and sacral  areas  Patient tolerated the procedure well with improvement in symptoms  Patient given exercises, stretches and lifestyle modifications  See medications in patient instructions if given  Patient will follow up in 4-8 weeks      The above documentation has been reviewed and is accurate and complete Helina Hullum M Keelin Sheridan, DO        Note: This dictation was prepared with Dragon dictation along with smaller phrase technology. Any transcriptional errors that result from this process are unintentional.

## 2024-03-23 ENCOUNTER — Ambulatory Visit: Admitting: Family Medicine

## 2024-03-23 ENCOUNTER — Ambulatory Visit

## 2024-03-23 VITALS — BP 128/70 | HR 83 | Ht 67.0 in | Wt 187.0 lb

## 2024-03-23 DIAGNOSIS — M25561 Pain in right knee: Secondary | ICD-10-CM

## 2024-03-23 DIAGNOSIS — M9908 Segmental and somatic dysfunction of rib cage: Secondary | ICD-10-CM | POA: Diagnosis not present

## 2024-03-23 DIAGNOSIS — M7051 Other bursitis of knee, right knee: Secondary | ICD-10-CM

## 2024-03-23 DIAGNOSIS — M5416 Radiculopathy, lumbar region: Secondary | ICD-10-CM

## 2024-03-23 DIAGNOSIS — M1711 Unilateral primary osteoarthritis, right knee: Secondary | ICD-10-CM | POA: Diagnosis not present

## 2024-03-23 DIAGNOSIS — M9902 Segmental and somatic dysfunction of thoracic region: Secondary | ICD-10-CM

## 2024-03-23 DIAGNOSIS — M9904 Segmental and somatic dysfunction of sacral region: Secondary | ICD-10-CM | POA: Diagnosis not present

## 2024-03-23 DIAGNOSIS — M48061 Spinal stenosis, lumbar region without neurogenic claudication: Secondary | ICD-10-CM | POA: Diagnosis not present

## 2024-03-23 DIAGNOSIS — M9903 Segmental and somatic dysfunction of lumbar region: Secondary | ICD-10-CM | POA: Diagnosis not present

## 2024-03-23 DIAGNOSIS — M9901 Segmental and somatic dysfunction of cervical region: Secondary | ICD-10-CM | POA: Diagnosis not present

## 2024-03-23 NOTE — Patient Instructions (Addendum)
 Injection today Good to see you! Tell PT to do a little pes anserine Avoid full extension on the machine Keep hands in peripheral vision  See you again in 2-3 months

## 2024-03-24 ENCOUNTER — Encounter: Payer: Self-pay | Admitting: Family Medicine

## 2024-03-24 DIAGNOSIS — M7050 Other bursitis of knee, unspecified knee: Secondary | ICD-10-CM | POA: Insufficient documentation

## 2024-03-24 NOTE — Assessment & Plan Note (Signed)
 Patient is maintained does have some moderate arthritic changes but it appears to be more of a bursitis noted today that is giving him more discomfort.  Given injection and patient had some improvement in range of motion of the knee already.  We will see how patient responds and warned of any potential side effects.  Follow-up again in the 6 to 12 weeks discussed thigh compression sleeve as well

## 2024-03-24 NOTE — Assessment & Plan Note (Signed)
 Known spinal stenosis and significant more tightness recently with patient being a little more stationary secondary to the neck pain previously.  Is seen formal physical therapy.  No significant change in medications but does have the gabapentin .  We discussed different injections including the most recent epidural and can repeat if necessary.  Continue to stay active.  Follow-up again in 6 to 12 weeks

## 2024-03-29 DIAGNOSIS — L720 Epidermal cyst: Secondary | ICD-10-CM | POA: Diagnosis not present

## 2024-04-01 ENCOUNTER — Ambulatory Visit: Payer: Self-pay | Admitting: Family Medicine

## 2024-04-04 ENCOUNTER — Encounter: Payer: Self-pay | Admitting: Family Medicine

## 2024-04-25 ENCOUNTER — Other Ambulatory Visit

## 2024-04-25 DIAGNOSIS — I10 Essential (primary) hypertension: Secondary | ICD-10-CM

## 2024-04-25 DIAGNOSIS — E785 Hyperlipidemia, unspecified: Secondary | ICD-10-CM

## 2024-04-26 ENCOUNTER — Ambulatory Visit: Payer: Self-pay | Admitting: Family Medicine

## 2024-04-26 LAB — LIPID PANEL
Chol/HDL Ratio: 2.7 ratio (ref 0.0–5.0)
Cholesterol, Total: 197 mg/dL (ref 100–199)
HDL: 72 mg/dL
LDL Chol Calc (NIH): 114 mg/dL — ABNORMAL HIGH (ref 0–99)
Triglycerides: 60 mg/dL (ref 0–149)
VLDL Cholesterol Cal: 11 mg/dL (ref 5–40)

## 2024-05-05 ENCOUNTER — Encounter: Payer: Self-pay | Admitting: Family Medicine

## 2024-05-06 ENCOUNTER — Other Ambulatory Visit: Payer: Self-pay

## 2024-05-06 DIAGNOSIS — M5416 Radiculopathy, lumbar region: Secondary | ICD-10-CM

## 2024-05-19 ENCOUNTER — Encounter: Payer: Self-pay | Admitting: Family Medicine

## 2024-05-23 ENCOUNTER — Ambulatory Visit: Admitting: Family Medicine

## 2024-05-31 NOTE — Discharge Instructions (Signed)

## 2024-06-01 ENCOUNTER — Inpatient Hospital Stay: Admission: RE | Admit: 2024-06-01 | Source: Ambulatory Visit

## 2024-06-07 ENCOUNTER — Other Ambulatory Visit

## 2024-06-09 ENCOUNTER — Ambulatory Visit: Admitting: Family Medicine
